# Patient Record
Sex: Female | Born: 1955 | Race: White | Hispanic: No | Smoking: Former smoker
Health system: Southern US, Community
[De-identification: ages and names within clinical notes are randomized; demographics above are authoritative.]

## PROBLEM LIST (undated history)

## (undated) DIAGNOSIS — T7840XA Allergy, unspecified, initial encounter: Secondary | ICD-10-CM

## (undated) DIAGNOSIS — F419 Anxiety disorder, unspecified: Secondary | ICD-10-CM

## (undated) DIAGNOSIS — A319 Mycobacterial infection, unspecified: Secondary | ICD-10-CM

## (undated) DIAGNOSIS — I891 Lymphangitis: Secondary | ICD-10-CM

## (undated) DIAGNOSIS — J449 Chronic obstructive pulmonary disease, unspecified: Secondary | ICD-10-CM

## (undated) DIAGNOSIS — R110 Nausea: Secondary | ICD-10-CM

## (undated) DIAGNOSIS — E785 Hyperlipidemia, unspecified: Secondary | ICD-10-CM

## (undated) DIAGNOSIS — R42 Dizziness and giddiness: Secondary | ICD-10-CM

## (undated) DIAGNOSIS — I10 Essential (primary) hypertension: Secondary | ICD-10-CM

## (undated) DIAGNOSIS — I96 Gangrene, not elsewhere classified: Principal | ICD-10-CM

## (undated) DIAGNOSIS — F32A Depression, unspecified: Secondary | ICD-10-CM

## (undated) DIAGNOSIS — F329 Major depressive disorder, single episode, unspecified: Secondary | ICD-10-CM

## (undated) HISTORY — DX: Gangrene, not elsewhere classified: I96

## (undated) HISTORY — PX: FOOT SURGERY: SHX648

## (undated) HISTORY — DX: Hyperlipidemia, unspecified: E78.5

## (undated) HISTORY — DX: Allergy, unspecified, initial encounter: T78.40XA

## (undated) HISTORY — PX: SLIPPED CAPITAL FEMORAL EPIPHYSIS PINNING: SHX391

## (undated) HISTORY — DX: Essential (primary) hypertension: I10

## (undated) HISTORY — DX: Dizziness and giddiness: R42

## (undated) HISTORY — DX: Nausea: R11.0

## (undated) HISTORY — PX: LEG AMPUTATION: SHX1105

## (undated) HISTORY — DX: Depression, unspecified: F32.A

## (undated) HISTORY — PX: HIP PINNING: SHX1757

## (undated) HISTORY — DX: Anxiety disorder, unspecified: F41.9

## (undated) HISTORY — DX: Major depressive disorder, single episode, unspecified: F32.9

---

## 2011-03-13 ENCOUNTER — Ambulatory Visit (INDEPENDENT_AMBULATORY_CARE_PROVIDER_SITE_OTHER): Payer: BC Managed Care – PPO | Admitting: Family Medicine

## 2011-03-13 ENCOUNTER — Ambulatory Visit: Payer: BC Managed Care – PPO

## 2011-03-13 VITALS — BP 152/70 | HR 81 | Temp 98.0°F | Resp 18 | Ht 69.0 in | Wt 309.8 lb

## 2011-03-13 DIAGNOSIS — J019 Acute sinusitis, unspecified: Secondary | ICD-10-CM

## 2011-03-13 DIAGNOSIS — J329 Chronic sinusitis, unspecified: Secondary | ICD-10-CM

## 2011-03-13 DIAGNOSIS — J40 Bronchitis, not specified as acute or chronic: Secondary | ICD-10-CM

## 2011-03-13 DIAGNOSIS — R0602 Shortness of breath: Secondary | ICD-10-CM

## 2011-03-13 MED ORDER — AMOXICILLIN-POT CLAVULANATE 875-125 MG PO TABS: 1.0000 | ORAL_TABLET | Freq: Two times a day (BID) | ORAL | Status: AC

## 2011-03-13 NOTE — Progress Notes (Addendum)
[facility]:  Office Visit  Chief Complaint:  Chief Complaint  Patient presents with  . Shortness of Breath    COULD NOT LEEP LAST NIGHT FELT LIKE SHE WAS DROWNING  . Sinusitis  . Fever    HPI: Rita Watkins is a 56 y.o. female who complains of  1-2 day history of SOB and sinus pain, inability to sleep due to this, no cough or Temp greater than 101.6. She has tried albuterol and pulmicort with some relief. No cough, no wheezing.Hx of smoking 1/2 ppd.    Past Medical History  Diagnosis Date  . Allergy   . Asthma   . Hypertension   . Hyperlipidemia    Past Surgical History  Procedure Date  . Slipped capital femoral epiphysis pinning    History   Social History  . Marital Status: Married    Spouse Name: N/A    Number of Children: N/A  . Years of Education: N/A   Social History Main Topics  . Smoking status: Current Everyday Smoker -- 0.5 packs/day for 35 years    Types: Cigarettes  . Smokeless tobacco: None  . Alcohol Use: No  . Drug Use: No  . Sexually Active: None   Other Topics Concern  . None   Social History Narrative  . None   Family History  Problem Relation Age of Onset  . Hypertension Mother   . Hypertension Father    Allergies  Allergen Reactions  . Avelox (Moxifloxacin Hcl In Nacl)   . Lamisil Swelling  . Singulair   . Zocor (Simvastatin - High Dose)    Prior to Admission medications   Medication Sig Start Date End Date Taking? Authorizing Provider  albuterol (PROVENTIL) (2.5 MG/3ML) 0.083% nebulizer solution Take 2.5 mg by nebulization every 6 (six) hours as needed.   Yes Historical Provider, MD  amLODipine-benazepril (LOTREL) 10-40 MG per capsule Take 1 capsule by mouth daily.   Yes Historical Provider, MD  dornase alpha (PULMOZYME) 1 MG/ML nebulizer solution Inhale 2.5 mg into the lungs daily.   Yes Historical Provider, MD     ROS: The patient denies fevers, night sweats, unintentional weight loss, chest pain,  palpitations, wheezing, dyspnea on exertion, nausea, vomiting, abdominal pain, dysuria, hematuria, melena, numbness, weakness, or tingling. +SOB, +chills, + sinus pressure, neg cough  All other systems have been reviewed and were otherwise negative with the exception of those mentioned in the HPI and as above.    PHYSICAL EXAM: Filed Vitals:   03/13/11 0936  BP: 152/70  Pulse: 81  Temp: 98 F (36.7 C)  Resp: 18   Filed Vitals:   03/13/11 0936  Height: 5\' 9"  (1.753 m)  Weight: 309 lb 12.8 oz (140.524 kg)   Body mass index is 45.75 kg/(m^2). Pulse Ox: 97-100%, repeat 2 x  General: Alert, no acute distress, obese  HEENT:  Normocephalic, atraumatic, oropharynx patent.+ erythema, + sinus tenederness, Ears normal Cardiovascular:  Regular rate and rhythm, no rubs murmurs or gallops.  No Carotid bruits, radial pulse intact. No pedal edema.  Respiratory: Clear to auscultation bilaterally.  No wheezes, rales, or rhonchi.  No cyanosis, no use of accessory musculature GI: No organomegaly, abdomen is soft and non-tender, positive bowel sounds.  No masses. Skin: No rashes. Neurologic: Facial musculature symmetric. Psychiatric: Patient is appropriate throughout our interaction. Lymphatic: No axillary or cervical lymphadenopathy Musculoskeletal: Gait intact. Genitourinary:   LABS: No results found for this or any previous visit.   EKG/XRAY:  Primary read interpreted by Dr. Conley Rolls at [facility]. No infiltrate, no pneumo, effusion, atelectasis. Bilateral hilar LAD, and increase vasc. No e/o CHF   ASSESSMENT/PLAN: Encounter Diagnoses  Name Primary?  . SOB (shortness of breath) No  . Sinusitis Yes  . Bronchitis Yes   1. Sinusitis-Augmentin and sxs treatment 2. Bronchitis-Augmentin and sxs treatment 3. HTN-patient did not take meds today yet.    Laquonda Welby PHUONG, DO 03/13/2011 10:37 AM

## 2011-04-21 ENCOUNTER — Other Ambulatory Visit: Payer: Self-pay | Admitting: Internal Medicine

## 2011-07-20 ENCOUNTER — Other Ambulatory Visit: Payer: Self-pay | Admitting: Physician Assistant

## 2011-09-14 ENCOUNTER — Encounter: Payer: Self-pay | Admitting: Family Medicine

## 2011-09-14 ENCOUNTER — Ambulatory Visit (INDEPENDENT_AMBULATORY_CARE_PROVIDER_SITE_OTHER): Payer: BC Managed Care – PPO | Admitting: Family Medicine

## 2011-09-14 VITALS — BP 130/66 | HR 78 | Temp 98.3°F | Resp 16 | Ht 69.0 in | Wt 319.4 lb

## 2011-09-14 DIAGNOSIS — E559 Vitamin D deficiency, unspecified: Secondary | ICD-10-CM

## 2011-09-14 DIAGNOSIS — I10 Essential (primary) hypertension: Secondary | ICD-10-CM

## 2011-09-14 DIAGNOSIS — E785 Hyperlipidemia, unspecified: Secondary | ICD-10-CM

## 2011-09-14 DIAGNOSIS — G8929 Other chronic pain: Secondary | ICD-10-CM

## 2011-09-14 DIAGNOSIS — Z23 Encounter for immunization: Secondary | ICD-10-CM

## 2011-09-14 LAB — LIPID PANEL
HDL: 32 mg/dL — ABNORMAL LOW (ref >39)
LDL Cholesterol: 136 mg/dL — ABNORMAL HIGH (ref 0–99)
Total CHOL/HDL Ratio: 7.3 Ratio
Triglycerides: 321 mg/dL — ABNORMAL HIGH (ref <150)

## 2011-09-14 LAB — COMPREHENSIVE METABOLIC PANEL
AST: 13 U/L (ref 0–37)
Alkaline Phosphatase: 97 U/L (ref 39–117)
BUN: 11 mg/dL (ref 6–23)
Creat: 0.72 mg/dL (ref 0.50–1.10)
Glucose, Bld: 86 mg/dL (ref 70–99)
Total Bilirubin: 0.4 mg/dL (ref 0.3–1.2)

## 2011-09-14 MED ORDER — GABAPENTIN 400 MG PO CAPS: 400.0000 mg | ORAL_CAPSULE | Freq: Three times a day (TID) | ORAL | Status: DC

## 2011-09-14 MED ORDER — AMLODIPINE BESY-BENAZEPRIL HCL 10-40 MG PO CAPS: 1.0000 | ORAL_CAPSULE | Freq: Every day | ORAL | Status: DC

## 2011-09-14 MED ORDER — ALBUTEROL SULFATE HFA 108 (90 BASE) MCG/ACT IN AERS: 2.0000 | INHALATION_SPRAY | Freq: Four times a day (QID) | RESPIRATORY_TRACT | Status: DC | PRN

## 2011-09-14 MED ORDER — TRAMADOL-ACETAMINOPHEN 37.5-325 MG PO TABS: ORAL_TABLET | ORAL | Status: DC

## 2011-09-14 MED ORDER — GABAPENTIN 400 MG PO CAPS: ORAL_CAPSULE | ORAL | Status: DC

## 2011-09-14 NOTE — Patient Instructions (Signed)

## 2011-09-15 LAB — VITAMIN D 25 HYDROXY (VIT D DEFICIENCY, FRACTURES): Vit D, 25-Hydroxy: 18 ng/mL — ABNORMAL LOW (ref 30–89)

## 2011-09-17 ENCOUNTER — Other Ambulatory Visit: Payer: Self-pay | Admitting: Family Medicine

## 2011-09-17 MED ORDER — ERGOCALCIFEROL 1.25 MG (50000 UT) PO CAPS: 50000.0000 [IU] | ORAL_CAPSULE | ORAL | Status: AC

## 2011-09-17 NOTE — Progress Notes (Signed)
Quick Note:  Please call pt and advise that the following labs are abnormal... Lipids are above normal. I would suggest pt work on healthier nutrition and regular exercise to lower total cholesterol, LDL ("bad") cholesterol and triglycerides and get HDL ("good") up above 40. These numbers should be rechecked in 3-4 months.  Vitamin D is low; I am e-prescribing Vitamin D 50,000 IU to be taken 1 capsule once a week to get her Vitamin D up to about 50. There will be refills so she needs to take it until she has no refills left.  Copy to pt. ______

## 2011-09-22 ENCOUNTER — Telehealth: Payer: Self-pay

## 2011-09-22 NOTE — Telephone Encounter (Signed)
Spoke with pt, we have to call [facility] for her Rx for Vitamin D, call [phone] #[phone]. I called [facility] to see what the issue was. They just wanted to see if we could give her a 90 day supply. I authorized 90 day supply with 1 refill.

## 2011-09-23 ENCOUNTER — Encounter: Payer: Self-pay | Admitting: Family Medicine

## 2011-09-23 DIAGNOSIS — I10 Essential (primary) hypertension: Secondary | ICD-10-CM | POA: Insufficient documentation

## 2011-09-23 NOTE — Progress Notes (Signed)
S: This 56 y.o. Cauc female returns for HTN follow-up and med refill. She c/o muscle cramps, chronic LBP and allergies. She has been working on weight loss since I last saw her in September. She has hyperlipidemia and has tried Atorvastatin but cannot tolerate the medication (musculoskeletal side-effects). She would like lipids checked today as well as Vitamin D level; she had borderline low Vit D in Sept 2012.  ROS: Denies appetite or activity change, fever or chills, vision disturbances, chest pain, palpitations, SOB, cough or chest tightness, GU/GI symptoms, dizziness, headache, syncope, numbness or weakness. She has muscle cramps and LBP.  O:  Filed Vitals:   09/14/11 0908  BP: 130/66  Pulse: 78  Temp: 98.3 F (36.8 C)  Resp: 16    GEN: In NAD; WN,WD. HENT: [place]/AT; EOMI, conj/scl clear. LUNGS: Normal resp rate and effort;CTA COR: RRR EXT: No edema NEURO: A&O x 3; CNs intact; otherwise, nonfocal.   A/P:      1. Need for prophylactic vaccination against Streptococcus pneumoniae (pneumococcus)  Pneumococcal polysaccharide vaccine 23-valent greater than or equal to 2yo subcutaneous/IM  2. HTN (hypertension)  Comprehensive metabolic panel RF: Amlodipine- benazepril 10-40   #90   3 RFs  3. Hyperlipidemia  Lipid panel  4. Chronic pain  RF: Gabapentin 400 mg  2 caps at bedtime RF: Tramadol- APAP 37.5- 325   2 tabs tid for pain.  5. Unspecified vitamin D deficiency  Vitamin D, 25-hydroxy

## 2012-05-19 ENCOUNTER — Other Ambulatory Visit: Payer: Self-pay | Admitting: Family Medicine

## 2012-05-20 ENCOUNTER — Telehealth: Payer: Self-pay

## 2012-05-20 NOTE — Telephone Encounter (Signed)
Pharmacy requests RF of Tramadol.

## 2012-05-21 ENCOUNTER — Telehealth: Payer: Self-pay | Admitting: *Deleted

## 2012-05-21 MED ORDER — TRAMADOL-ACETAMINOPHEN 37.5-325 MG PO TABS: ORAL_TABLET | ORAL | Status: DC

## 2012-05-21 NOTE — Telephone Encounter (Signed)
I have refilled Tramadol; it will be faxed to [facility].

## 2012-05-21 NOTE — Telephone Encounter (Signed)
Faxed prescription to patient's pharmacy at 3:06p.

## 2012-05-29 ENCOUNTER — Telehealth: Payer: Self-pay

## 2012-05-29 ENCOUNTER — Other Ambulatory Visit: Payer: Self-pay | Admitting: Radiology

## 2012-05-29 MED ORDER — ALPRAZOLAM 0.5 MG PO TABS: ORAL_TABLET | ORAL | Status: DC

## 2012-05-29 NOTE — Telephone Encounter (Signed)
Cancelled the Rx at [address] called in to [facility].

## 2012-05-29 NOTE — Telephone Encounter (Signed)
Rita Watkins is a pt of Dr. Audria Nine, and Rita Watkins is calling to see if something could be called in for her Aniexty her husband passed away this morning. Pharmacy she uses is [facility] on [address] back number is [phone]

## 2012-05-29 NOTE — Telephone Encounter (Signed)
I spoke with pt whose husband died suddenly in his sleep. He was at home. She needs medication called to [facility] for anxiety. Alprazolam 0.5 mg 1 tab bid prn called to [facility] on [address].

## 2012-06-14 ENCOUNTER — Encounter: Payer: Self-pay | Admitting: Family Medicine

## 2012-06-14 ENCOUNTER — Ambulatory Visit (INDEPENDENT_AMBULATORY_CARE_PROVIDER_SITE_OTHER): Payer: Self-pay | Admitting: Family Medicine

## 2012-06-14 VITALS — BP 133/61 | HR 76 | Temp 97.6°F | Resp 16 | Ht 69.5 in | Wt 311.0 lb

## 2012-06-14 DIAGNOSIS — F411 Generalized anxiety disorder: Secondary | ICD-10-CM

## 2012-06-14 DIAGNOSIS — F410 Panic disorder [episodic paroxysmal anxiety] without agoraphobia: Secondary | ICD-10-CM

## 2012-06-14 DIAGNOSIS — F4321 Adjustment disorder with depressed mood: Secondary | ICD-10-CM

## 2012-06-14 MED ORDER — ALPRAZOLAM 0.5 MG PO TABS: ORAL_TABLET | ORAL | Status: DC

## 2012-06-14 MED ORDER — PAROXETINE HCL 20 MG PO TABS: 20.0000 mg | ORAL_TABLET | ORAL | Status: DC

## 2012-06-14 NOTE — Patient Instructions (Addendum)
Please seek grief counseling with the counselor of your choice. HOSPICE is an excellent resource. Take the medication- Paroxetine 20 mg 1 tablet every morning and take Alprazolam as directed to help with panic attacks and anxiety.    Grief Reaction Grief is a normal response to the death of someone close to you. Feelings of fear, anger, and guilt can affect almost everyone who loses someone they love. Symptoms of depression are also common. These include problems with sleep, loss of appetite, and lack of energy. These grief reaction symptoms often last for weeks to months after a loss. They may also return during special times that remind you of the person you lost, such as an anniversary or birthday. Anxiety, insomnia, irritability, and deep depression may last beyond the period of normal grief. If you experience these feelings for 6 months or longer, you may have clinical depression. Clinical depression requires further medical attention. If you think that you have clinical depression, you should contact your caregiver. If you have a history of depression and or a family history of depression, you are at greater risk of clinical depression. You are also at greater risk of developing clinical depression if the loss was traumatic or the loss was of someone with whom you had unresolved issues.  A grief reaction can become complicated by being blocked. This means being unable to cry or express extreme emotions. This may prolong the grieving period and worsen the emotional effects of the loss. Mourning is a natural event in human life. A healthy grief reaction is one that is not blocked . It requires a time of sadness and readjustment.It is very important to share your sorrow and fear with others, especially close friends and family. Professional counselors and clergy can also help you process your grief. Document Released: 01/23/2005 Document Revised: 04/17/2011 Document Reviewed: 10/03/2005 [facility]  Patient Information 2013 [place], [place].

## 2012-06-17 NOTE — Progress Notes (Signed)
S:  This 57 y.o. Cauc female is here to discuss panic attacks, onset after unexpected death of husband who was a patient with this practice.  He died quietly in his sleep at home; pt thought he was sleeping sounding. She could not easily awaken him and realized that he was not breathing. She has been trying to handle financial matters as well as work. She is not sleeping well and having panic attacks w/ SOB, CP and tightness, palpitations, diaphoresis and crying spells. She has Alprazolam but not using it as prescribed for fear of being to heavily sedated. She has not sought grief counseling but some resources have been recommended by her friends.  PMHx, Soc Hx and Fam Hx reviewed.  ROS: As per HPI.  O: Filed Vitals:   06/14/12 1128  BP: 133/61  Pulse: 76  Temp: 97.6 F (36.4 C)  Resp: 16   GEN: In NAD; WN,WD. Pt  Is obese. HENT: [place]/AT. EOMI w/ injected conj and tearing. Otherwise normal. COR: RRR. LUNGS: Normal resp rate and effort. SKIN: W&D; no erythema or pallor. NEURO: A&O x 3; CNs intact. Mentation- pt is emotionally labile and tearful. Speech and thought pattern are coherent and behavior is appropriate given the circumstances. Judgement is normal.  A/P: Panic attacks- Continue Alprazolam; try reducing dose by half.                           RX: Paroxetine 20 mg 1/2 tab every AM for 1st week then increase dose to  1 tablet every AM.  Grief reaction- Encouraged pt to seek counseling; recommended [facility]osp[facility].  Meds ordered this encounter  Medications  . PARoxetine (PAXIL) 20 MG tablet    Sig: Take 1 tablet (20 mg total) by mouth every morning.    Dispense:  30 tablet    Refill:  5  . ALPRAZolam (XANAX) 0.5 MG tablet    Sig: Take 1/2 tablet in morning and 1 tablet at bedtime for sleep.    Dispense:  45 tablet    Refill:  1

## 2012-07-19 ENCOUNTER — Ambulatory Visit: Payer: Self-pay | Admitting: Family Medicine

## 2012-07-19 ENCOUNTER — Encounter: Payer: Self-pay | Admitting: Family Medicine

## 2012-07-19 VITALS — BP 122/64 | HR 72 | Temp 97.9°F | Resp 16 | Ht 69.0 in | Wt 300.0 lb

## 2012-07-19 DIAGNOSIS — I10 Essential (primary) hypertension: Secondary | ICD-10-CM

## 2012-07-19 DIAGNOSIS — F411 Generalized anxiety disorder: Secondary | ICD-10-CM

## 2012-07-19 MED ORDER — ALBUTEROL SULFATE HFA 108 (90 BASE) MCG/ACT IN AERS: INHALATION_SPRAY | RESPIRATORY_TRACT | Status: DC

## 2012-07-19 MED ORDER — AMLODIPINE BESY-BENAZEPRIL HCL 10-40 MG PO CAPS: 1.0000 | ORAL_CAPSULE | Freq: Every day | ORAL | Status: DC

## 2012-07-19 MED ORDER — ALPRAZOLAM 0.5 MG PO TABS: ORAL_TABLET | ORAL | Status: DC

## 2012-07-19 NOTE — Addendum Note (Signed)
Addended by: Dow Adolph B on: 07/19/2012 10:44 AM   Modules accepted: Orders

## 2012-07-19 NOTE — Progress Notes (Signed)
S: This 57 y.o. Cauc female is here for follow-up after starting medication for panic attacks/ grief reaction. She has altered the Paroxetine dose so that she is not as sedated especially if she takes Alprazolam also. The dose that works best for her is: Paroxetine 20 mg 1/2 tablet with Alprazolam 1/2 tablet every morning. This enables her to function and get out of the house to take care of financial matters related to her husband's death. If she feels anxious and panicky, she will take another Paroxetine 1/2 tablet. She does not feel sedated when she takes the medications this way. She has a young man at a [employer] who is helping her with those matters. She has also joined a group of women who have lost their husbands; they meet in the members' homes and are very supported. This has helped the pt tremendously. Pt has concerns about sedation so she is not taking the Tramadol nor the Gabapentin. Appetite is fair; pt has lost 11 pounds since last visit. Pt  Is compliant w/ BP medication w/o adverse effects. In general, she feels better and is working.   PMHx, Soc Hx and Prob List reviewed.  ROS: Negative for diaphoresis, excessive fatigue, CP or tightness, palpitations (except as associated w/ panic attack), GI problems, HA, dizziness, weakness or syncope. She is not agitated or confused and has no SI/HI or thoughts of self harm.  O:  Filed Vitals:   07/19/12 0942  BP: 122/64                                         Weight is down 11 pounds since last visit.  Pulse: 72  Temp: 97.9 F (36.6 C)  Resp: 16   GEN: In NAD; WN,WD.   HENT: [place]/AT; otherwise unremarkable. COR: RRR. LUNGS: Normal resp rate and effort. SKIN: W&D.No pallor. NEURO: A&O x 3; CNs intact. Nonfocal. PSYCH: Flat affect but calm demeanor. Attentive and conversant. Speech pattern is normal and thought content is sound. Judgement is good.  A/P: HTN (hypertension)- Stable and controlled; continue current medication  (refilled).  Anxiety state, unspecified- Current medications are appropriate and effective; agree with pt's dosing. Continue through grieving process w/ support group. (Today is the anniversary of when the pt met her deceased husband online 17 years ago; he died 8 days after their 23-Jun-2022 wedding anniversary).  Pt concerned about elevated lipids and cost of having labs checked. We will address this at next visit.

## 2012-07-19 NOTE — Patient Instructions (Addendum)
I think the medications as you are taking them is working well for you. Continue to try to take good care of yourself and I will see you in 2 months.

## 2012-08-19 ENCOUNTER — Other Ambulatory Visit: Payer: Self-pay | Admitting: Family Medicine

## 2012-09-13 ENCOUNTER — Encounter: Payer: Self-pay | Admitting: Family Medicine

## 2012-09-13 ENCOUNTER — Other Ambulatory Visit: Payer: Self-pay | Admitting: Radiology

## 2012-09-13 ENCOUNTER — Ambulatory Visit (INDEPENDENT_AMBULATORY_CARE_PROVIDER_SITE_OTHER): Payer: BC Managed Care – PPO | Admitting: Family Medicine

## 2012-09-13 VITALS — BP 112/64 | HR 74 | Temp 97.7°F | Resp 18 | Ht 69.0 in | Wt 297.0 lb

## 2012-09-13 DIAGNOSIS — F418 Other specified anxiety disorders: Secondary | ICD-10-CM | POA: Insufficient documentation

## 2012-09-13 DIAGNOSIS — J320 Chronic maxillary sinusitis: Secondary | ICD-10-CM

## 2012-09-13 DIAGNOSIS — F341 Dysthymic disorder: Secondary | ICD-10-CM

## 2012-09-13 HISTORY — DX: Other specified anxiety disorders: F41.8

## 2012-09-13 MED ORDER — ALPRAZOLAM 0.5 MG PO TABS: ORAL_TABLET | ORAL | Status: DC

## 2012-09-13 MED ORDER — MUPIROCIN 2 % EX OINT: TOPICAL_OINTMENT | Freq: Three times a day (TID) | CUTANEOUS | Status: DC

## 2012-09-13 MED ORDER — CEFUROXIME AXETIL 250 MG PO TABS: 250.0000 mg | ORAL_TABLET | Freq: Two times a day (BID) | ORAL | Status: DC

## 2012-09-13 NOTE — Patient Instructions (Addendum)
Sinusitis Sinusitis is redness, soreness, and puffiness (inflammation) of the air pockets in the bones of your face (sinuses). The redness, soreness, and puffiness can cause air and mucus to get trapped in your sinuses. This can allow germs to grow and cause an infection.  HOME CARE   Drink enough fluids to keep your pee (urine) clear or pale yellow.  Use a humidifier in your home.  Run a hot shower to create steam in the bathroom. Sit in the bathroom with the door closed. Breathe in the steam 3 4 times a day.  Put a warm, moist washcloth on your face 3 4 times a day, or as told by your doctor.  Use salt water sprays (saline sprays) to wet the thick fluid in your nose. This can help the sinuses drain.  Only take medicine as told by your doctor. GET HELP RIGHT AWAY IF:   Your pain gets worse.  You have very bad headaches.  You are sick to your stomach (nauseous).  You throw up (vomit).  You are very sleepy (drowsy) all the time.  Your face is puffy (swollen).  Your vision changes.  You have a stiff neck.  You have trouble breathing. MAKE SURE YOU:   Understand these instructions.  Will watch your condition.  Will get help right away if you are not doing well or get worse. Document Released: 07/12/2007 Document Revised: 10/18/2011 Document Reviewed: 08/29/2011 [employer] Patient Information 2014 [employer].  

## 2012-09-13 NOTE — Progress Notes (Signed)
S:  Thsi 57 y.o Cauc female has depression and anxiety following the death of her husband. She has received significant help and counseling with a group of widows. She is taking 1/2 tab of Paxil and this works fine most days. She tried stopping the med for 1 week but realized that she needs to continue this anti-depressant. Alprazolam continues to be helpful. Her appetite has improved.  Pt c/o L sinus pain w/ drainage, sore throat and voice change. She has felt febrile and sluggish. Pt c/o small sore in L nares. She does not have HA, ear pain, epistaxis, a prod cough or chest pain. She denies SOB or DOE.  Patient Active Problem List   Diagnosis Date Noted  . Depression with anxiety 09/13/2012  . Panic attacks 06/17/2012  . HTN (hypertension) 09/23/2011  . Hyperlipidemia 09/23/2011  . Chronic pain 09/23/2011  . Unspecified vitamin D deficiency 09/23/2011    PMHx, Soc Hx and Fam Hx reveiwed. Medications reconciled.  OCeasar Mons Vitals:   09/13/12 0941  BP: 112/64                                Weight down 22 lbs in last 12 months  Pulse: 74  Temp: 97.7 F (36.5 C)  Resp: 18   GEN: In NAD; WN,WD. HENT: [place]/AT; EOMI w/clear conj/ sclerae. EACs normal w/ TMs- mild effusion, no erythema. Nasal mucosa red and slightly edematous. Post ph- erythema w/o exudate. Maxillary sinuses tender (R>L) w/ percussion. NECK: Supple w/ anterior cervical node tenderness on L. No palpable node enlargement. COR: RRR. LUNGS: CTA; no resp distress. SKIN: W&D; no rashes, erythema or pallor. NEURO: A&O x 3; Cns intact. Nonfocal. PSYCH: Pleasant and calm demeanor; flat affect but not labile or tearful.  A/P: Depression with anxiety- Continue current medications and counseling with peer group.  Left maxillary sinusitis-  Meds ordered this encounter  Medications  . ALPRAZolam (XANAX) 0.5 MG tablet    Sig: Take 1/2 tablet in morning and 1 tablet at bedtime for sleep.    Dispense:  45 tablet    Refill:  2  .  DISCONTD: cefUROXime (CEFTIN) 250 MG tablet    Sig: Take 1 tablet (250 mg total) by mouth 2 (two) times daily.    Dispense:  20 tablet    Refill:  0  . DISCONTD: mupirocin ointment (BACTROBAN) 2 %    Sig: Apply topically 3 (three) times daily.    Dispense:  22 g    Refill:  0

## 2012-10-11 ENCOUNTER — Other Ambulatory Visit: Payer: Self-pay | Admitting: Family Medicine

## 2013-01-17 ENCOUNTER — Encounter: Payer: Self-pay | Admitting: Family Medicine

## 2013-01-17 ENCOUNTER — Ambulatory Visit (INDEPENDENT_AMBULATORY_CARE_PROVIDER_SITE_OTHER): Payer: BC Managed Care – PPO | Admitting: Family Medicine

## 2013-01-17 VITALS — BP 151/65 | HR 63 | Temp 97.5°F | Resp 16 | Ht 69.0 in | Wt 293.0 lb

## 2013-01-17 DIAGNOSIS — F410 Panic disorder [episodic paroxysmal anxiety] without agoraphobia: Secondary | ICD-10-CM

## 2013-01-17 DIAGNOSIS — I10 Essential (primary) hypertension: Secondary | ICD-10-CM

## 2013-01-17 DIAGNOSIS — F341 Dysthymic disorder: Secondary | ICD-10-CM

## 2013-01-17 DIAGNOSIS — Z130 Encounter for screening for diseases of the blood and blood-forming organs and certain disorders involving the immune mechanism: Secondary | ICD-10-CM

## 2013-01-17 DIAGNOSIS — F418 Other specified anxiety disorders: Secondary | ICD-10-CM

## 2013-01-17 DIAGNOSIS — E559 Vitamin D deficiency, unspecified: Secondary | ICD-10-CM

## 2013-01-17 LAB — COMPLETE METABOLIC PANEL WITH GFR
AST: 17 U/L (ref 0–37)
Albumin: 3.5 g/dL (ref 3.5–5.2)
Alkaline Phosphatase: 99 U/L (ref 39–117)
BUN: 9 mg/dL (ref 6–23)
Chloride: 105 mEq/L (ref 96–112)
GFR, Est Non African American: >89 mL/min
Potassium: 4.1 mEq/L (ref 3.5–5.3)
Total Bilirubin: 0.5 mg/dL (ref 0.3–1.2)

## 2013-01-17 LAB — TSH: TSH: 1.845 u[IU]/mL (ref 0.350–4.500)

## 2013-01-17 LAB — LIPID PANEL
HDL: 34 mg/dL — ABNORMAL LOW (ref >39)
LDL Cholesterol: 126 mg/dL — ABNORMAL HIGH (ref 0–99)
Total CHOL/HDL Ratio: 6.2 Ratio
VLDL: 52 mg/dL — ABNORMAL HIGH (ref 0–40)

## 2013-01-17 LAB — T4, FREE: Free T4: 1.25 ng/dL (ref 0.80–1.80)

## 2013-01-17 MED ORDER — ALPRAZOLAM 0.5 MG PO TABS: ORAL_TABLET | ORAL | Status: DC

## 2013-01-17 MED ORDER — PAROXETINE HCL 20 MG PO TABS: 20.0000 mg | ORAL_TABLET | ORAL | Status: DC

## 2013-01-18 LAB — VITAMIN D 25 HYDROXY (VIT D DEFICIENCY, FRACTURES): Vit D, 25-Hydroxy: 17 ng/mL — ABNORMAL LOW (ref 30–89)

## 2013-01-20 ENCOUNTER — Other Ambulatory Visit: Payer: Self-pay | Admitting: Family Medicine

## 2013-01-20 MED ORDER — ERGOCALCIFEROL 1.25 MG (50000 UT) PO CAPS: 50000.0000 [IU] | ORAL_CAPSULE | ORAL | Status: DC

## 2013-01-20 NOTE — Progress Notes (Signed)
Spoke with patient about all of her labs. Called in the RX for her vit d and pravastain 20 mg from lab note. Copy of labs sent to the patient.

## 2013-01-20 NOTE — Progress Notes (Signed)
Subjective:    Patient ID: Rita Watkins, female    DOB: 1955-03-08, 57 y.o.   MRN: [id number]  HPI  This 57 y.o. Cauc female continues to grieve the unexpected death of her husband in late  06/06/2022 or early May 2014. She attends meetings w/ other widows and is compliant w/ medications. She still has occasional mild panic attacks requiring Alprazolam 1 tablet twice a day. She takes Paroxetine 20 mg 1/2 tablet twice daily; dosing this way eliminates drowsiness. She is back at work and able to concentrate and function w/o confusion or mental lapses.  Pt has no thoughts of self-harm or SI/HI. Appetite and sleep are good. Weight is down 4 lbs.  HTN- medication compliance is good w/o adverse effects. SBP readings at home are 130-140. Pt denies diaphoresis, CP or tightness, palpitations, edema, SOB or DOE, dizziness, numbness, weakness or syncope.  Pt has hx of low Vitamin D = 18 in August 2013. She has mild fatigue and struggles w/ weight loss. Has not had thyroid function tests checked recently. She is not taking a supplement. Pt also wants lipids rechecked since she is fasting. Not taking medication due to hx of intolerance of high dose Zocor in the past.  PMHx, Surg Hx, SOc and Fam Hx reviewed.  Medications reconciled.   Review of Systems  Constitutional: Negative for diaphoresis, activity change and unexpected weight change.  HENT: Negative.   Eyes: Negative.   Respiratory: Negative for cough, chest tightness, shortness of breath and wheezing.   Cardiovascular: Negative.   Gastrointestinal: Negative.   Endocrine: Negative.   Skin: Negative.   Neurological: Negative.   Psychiatric/Behavioral: Positive for sleep disturbance. The patient is nervous/anxious.        Objective:   Physical Exam  Nursing note and vitals reviewed. Constitutional: She is oriented to person, place, and time. She appears well-developed and well-nourished. No distress.  HENT:  Head: Normocephalic and atraumatic.    Right Ear: External ear normal.  Left Ear: External ear normal.  Nose: Nose normal.  Eyes: Conjunctivae and EOM are normal. Pupils are equal, round, and reactive to light. No scleral icterus.  Neck: Normal range of motion. Neck supple. No thyromegaly present.  Cardiovascular: Normal rate and regular rhythm.   Pulmonary/Chest: Effort normal. No respiratory distress.  Musculoskeletal: Normal range of motion. She exhibits no edema.  Neurological: She is alert and oriented to person, place, and time. No cranial nerve deficit. Coordination normal.  Skin: Skin is warm and dry. She is not diaphoretic. No erythema. No pallor.  Psychiatric: She has a normal mood and affect. Her behavior is normal. Judgment and thought content normal.      Assessment & Plan:  Depression with anxiety - Stable on current medications; continue Paroxetine 20 mg 1/2 tab bid. Continue support group.          Plan: TSH, T4, Free  HTN (hypertension) - Plan: T4, Free, COMPLETE METABOLIC PANEL WITH GFR   Panic attacks- Alprazolam dose change to 0.5 mg  1 tablet bid.  Unspecified vitamin D deficiency - Plan: Vitamin D, 25-hydroxy  Screening for other and unspecified endocrine, nutritional, metabolic, and immunity disorders - Plan: Lipid panel  Meds ordered this encounter  Medications  . ALPRAZolam (XANAX) 0.5 MG tablet    Sig: Take 1 tablet in morning and 1 tablet at bedtime for sleep.    Dispense:  60 tablet    Refill:  2  . PARoxetine (PAXIL) 20 MG tablet  Sig: Take 1 tablet (20 mg total) by mouth every morning.    Dispense:  30 tablet    Refill:  5

## 2013-01-20 NOTE — Progress Notes (Signed)
Quick Note:  Please advise pt regarding following labs... Lipid panel numbers are better compared to 1 year ago. Total cholesterol is down 20 points, triglycerides are down 63 points, HDL ("good cholesterol) is up 2 points, LDL ("bad") cholesterol is down 10 points. Your heart disease risk is down 1.1 point but is still above average for women. I want you to consider low dose Pravastatin; your record shows an intolerance of high-dose Zocor. I think you can tolerate a low-dose statin like Pravastatin. Think about this; in the meantime, get over-the-counter Fish Oil capsules 1000 mg and take 2 capsules daily. Take this supplement separate from other solid pills and capsules.  Thyroid function tests are normal. Metabolic panel is normal (this includes blood sugar, sodium, potassium, kidney and liver function tests).  Vitamin D level is still quite low; I am prescribing Vitamin D 16109 units, to be taken once a week. This prescription will be at [facility] for pick up.  Copy to pt.  ______

## 2013-01-23 ENCOUNTER — Other Ambulatory Visit: Payer: Self-pay | Admitting: Family Medicine

## 2013-04-25 ENCOUNTER — Encounter: Payer: Self-pay | Admitting: Family Medicine

## 2013-04-25 ENCOUNTER — Ambulatory Visit (INDEPENDENT_AMBULATORY_CARE_PROVIDER_SITE_OTHER): Payer: BC Managed Care – PPO | Admitting: Family Medicine

## 2013-04-25 VITALS — BP 138/60 | HR 74 | Temp 98.6°F | Resp 16 | Ht 69.0 in | Wt 293.0 lb

## 2013-04-25 DIAGNOSIS — F341 Dysthymic disorder: Secondary | ICD-10-CM

## 2013-04-25 DIAGNOSIS — F418 Other specified anxiety disorders: Secondary | ICD-10-CM

## 2013-04-25 DIAGNOSIS — I10 Essential (primary) hypertension: Secondary | ICD-10-CM

## 2013-04-25 DIAGNOSIS — E559 Vitamin D deficiency, unspecified: Secondary | ICD-10-CM

## 2013-04-25 MED ORDER — PAROXETINE HCL 20 MG PO TABS: 20.0000 mg | ORAL_TABLET | ORAL | Status: DC

## 2013-04-25 MED ORDER — GABAPENTIN 400 MG PO CAPS: ORAL_CAPSULE | ORAL | Status: DC

## 2013-04-25 MED ORDER — ALPRAZOLAM 0.5 MG PO TABS: ORAL_TABLET | ORAL | Status: DC

## 2013-04-25 MED ORDER — ERGOCALCIFEROL 1.25 MG (50000 UT) PO CAPS: 50000.0000 [IU] | ORAL_CAPSULE | ORAL | Status: DC

## 2013-04-28 NOTE — Progress Notes (Signed)
S:  This 58 y.o. Cauc female is here for HTN follow-up; she is stable on current medication w/o side effects. She takes medication daily and denies vision disturbance, diaphoresis, CP or tightness, palpitations, SOB or DOE, cough, HA, dizziness, weakness, numbness or syncope.   Depression and anxiety- pt's husband died unexpectedly 11 months ago; pt still marks each month since his passing. She is in a support group which has been extremely helpful. They will be meeting tomorrow for lunch. She is doing well on the medications- Paroxetine 20 mg and Alprazolam prn anxiety and for sleep. Her job has been stressful but she is coping well. Pt has good appetite, good energy level and good sleep hygiene. Pt has no thoughts of  agitation, confusion, concentration difficulties, behavior problems, self-harm or SI/HI.  She has a chronic Vitamin D def and continues to take Vitamin D 8657850000 units weekly. She had stopped taking the supplement and noticed a difference w/in a week so she resumed the vitamin.  Patient Active Problem List   Diagnosis Date Noted  . Depression with anxiety 09/13/2012  . Panic attacks 06/17/2012  . HTN (hypertension) 09/23/2011  . Hyperlipidemia 09/23/2011  . Chronic pain 09/23/2011  . Unspecified vitamin D deficiency 09/23/2011   Prior to Admission medications   Medication Sig Start Date End Date Taking? Authorizing Provider  albuterol (PROAIR HFA) 108 (90 BASE) MCG/ACT inhaler 2 puffs by mouth 4 times a day as needed for symptoms. 07/19/12  Yes Maurice MarchBarbara B Erina Hamme, MD  ALPRAZolam Prudy Feeler(XANAX) 0.5 MG tablet Take 1 tablet in morning and 1 tablet at bedtime for sleep.   Yes Maurice MarchBarbara B Cynde Menard, MD  amLODipine-benazepril (LOTREL) 10-40 MG per capsule TAKE 1 CAPSULE DAILY 01/23/13  Yes Maurice MarchBarbara B Roland Lipke, MD  ergocalciferol (DRISDOL) 50000 UNITS capsule Take 1 capsule (50,000 Units total) by mouth once a week.  04/25/14 Yes Maurice MarchBarbara B Terrion Poblano, MD  gabapentin (NEURONTIN) 400 MG capsule Take  1 capsule at bedtime or as directed.   Yes Maurice MarchBarbara B Raahi Korber, MD  PARoxetine (PAXIL) 20 MG tablet Take 1 tablet (20 mg total) by mouth every morning.   Yes Maurice MarchBarbara B Bronwen Pendergraft, MD   PMHx, Surg Hx, Soc and Fam Hx reviewed.  ROS: As per HPI; noncontributory.  O: Filed Vitals:   04/25/13 1524  BP: 138/60  Pulse: 74  Temp: 98.6 F (37 C)  Resp: 16   GEN: In NAD; WN, WD. HENT: [place]/AT; EOMI w/ clear conj/sclerae. Otherwise unremarkable. COR: RRR. LUNGS: Unlabored resp. SKIN: W&D; intact w/o diaphoresis, erythema or pallor. NEURO: A&O x 3; CNs intact. Nonfocal.  A/P: HTN (hypertension)- Stable and controlled on current medication.  Depression with anxiety- Improved on Paroxetine and prn Alprazolam.  Unspecified vitamin D deficiency- Continue Vit D 50000 units.  Meds ordered this encounter  Medications  . gabapentin (NEURONTIN) 400 MG capsule    Sig: Take 1 capsule at bedtime or as directed.    Dispense:  90 capsule    Refill:  3  . ergocalciferol (DRISDOL) 50000 UNITS capsule    Sig: Take 1 capsule (50,000 Units total) by mouth once a week.    Dispense:  12 capsule    Refill:  0  . ALPRAZolam (XANAX) 0.5 MG tablet    Sig: Take 1 tablet in morning and 1 tablet at bedtime for sleep.    Dispense:  60 tablet    Refill:  2  . PARoxetine (PAXIL) 20 MG tablet    Sig: Take 1 tablet (20 mg total)  by mouth every morning.    Dispense:  30 tablet    Refill:  5

## 2013-06-29 ENCOUNTER — Other Ambulatory Visit: Payer: Self-pay | Admitting: Family Medicine

## 2013-08-07 ENCOUNTER — Other Ambulatory Visit: Payer: Self-pay | Admitting: Family Medicine

## 2013-09-12 ENCOUNTER — Encounter: Payer: Self-pay | Admitting: Family Medicine

## 2013-09-12 ENCOUNTER — Ambulatory Visit (INDEPENDENT_AMBULATORY_CARE_PROVIDER_SITE_OTHER): Payer: BC Managed Care – PPO | Admitting: Family Medicine

## 2013-09-12 VITALS — BP 138/61 | HR 70 | Temp 97.4°F | Resp 16 | Ht 69.0 in | Wt 304.6 lb

## 2013-09-12 DIAGNOSIS — I10 Essential (primary) hypertension: Secondary | ICD-10-CM

## 2013-09-12 DIAGNOSIS — J0100 Acute maxillary sinusitis, unspecified: Secondary | ICD-10-CM

## 2013-09-12 DIAGNOSIS — J209 Acute bronchitis, unspecified: Secondary | ICD-10-CM

## 2013-09-12 MED ORDER — IPRATROPIUM BROMIDE 0.02 % IN SOLN
0.5000 mg | Freq: Once | RESPIRATORY_TRACT | Status: AC
  Administered 2013-09-12: 0.5 mg via RESPIRATORY_TRACT

## 2013-09-12 MED ORDER — ALPRAZOLAM 0.5 MG PO TABS: ORAL_TABLET | ORAL | Status: DC

## 2013-09-12 MED ORDER — ALBUTEROL SULFATE (2.5 MG/3ML) 0.083% IN NEBU
2.5000 mg | INHALATION_SOLUTION | Freq: Once | RESPIRATORY_TRACT | Status: AC
  Administered 2013-09-12: 2.5 mg via RESPIRATORY_TRACT

## 2013-09-12 MED ORDER — AMOXICILLIN-POT CLAVULANATE 875-125 MG PO TABS: 1.0000 | ORAL_TABLET | Freq: Two times a day (BID) | ORAL | Status: DC

## 2013-09-12 MED ORDER — AMLODIPINE BESY-BENAZEPRIL HCL 10-40 MG PO CAPS: ORAL_CAPSULE | ORAL | Status: DC

## 2013-09-12 MED ORDER — ALBUTEROL SULFATE HFA 108 (90 BASE) MCG/ACT IN AERS: INHALATION_SPRAY | RESPIRATORY_TRACT | Status: DC

## 2013-09-12 MED ORDER — PAROXETINE HCL 20 MG PO TABS: 20.0000 mg | ORAL_TABLET | ORAL | Status: DC

## 2013-09-12 MED ORDER — METHYLPREDNISOLONE ACETATE 80 MG/ML IJ SUSP
120.0000 mg | Freq: Once | INTRAMUSCULAR | Status: AC
Start: 2013-09-12 — End: 2013-09-12
  Administered 2013-09-12: 120 mg via INTRAMUSCULAR

## 2013-09-12 NOTE — Progress Notes (Signed)
Subjective:    Patient ID: Rita Watkins, female    DOB: [birth year], 58 y.o.   MRN: [id number]  HPI  This 58 y.o. Cauc female is here for medication refills. She has done well on current medication for HTN w/o side effects.  Chronic anxiety is effectively managed with Paroxetine; pt has no somnolence, dizziness, GI upset, sweating , anorexia, nervousness, HA or tremor.  Today, she presents w/ 2-day hx of NP cough, congestion, PND and sore throat. She had fever > 100 degrees w/ chills > 24 hours ago. Has facial pain ans sinus pressure. Upper gums very sensitive and swollen; difficult to put in upper dentures. Feels better today; needs something to help loosen phlegm.   Pt encouraged to sch CPE; she states she does not do "physicals" and "only comes when she needs refills or has a problem".  Patient Active Problem List   Diagnosis Date Noted  . Depression with anxiety 09/13/2012  . Panic attacks 06/17/2012  . HTN (hypertension) 09/23/2011  . Hyperlipidemia 09/23/2011  . Chronic pain 09/23/2011  . Unspecified vitamin D deficiency 09/23/2011    Prior to Admission medications   Medication Sig Start Date End Date Taking? Authorizing Provider  albuterol (PROAIR HFA) 108 (90 BASE) MCG/ACT inhaler 2 puffs by mouth 4 times a day as needed for symptoms.   Yes Maurice March, MD  ALPRAZolam Prudy Feeler) 0.5 MG tablet Take 1 tablet in morning and 1 tablet at bedtime for sleep.   Yes Maurice March, MD  amLODipine-benazepril (LOTREL) 10-40 MG per capsule TAKE 1 CAPSULE DAILY   Yes Maurice March, MD  gabapentin (NEURONTIN) 400 MG capsule Take 1 capsule at bedtime or as directed. 04/25/13  Yes Maurice March, MD  PARoxetine (PAXIL) 20 MG tablet Take 1 tablet (20 mg total) by mouth every morning.   Yes Maurice March, MD  Vitamin D, Ergocalciferol, (DRISDOL) 50000 UNITS CAPS capsule TAKE 1 CAPSULE ONCE A WEEK   Yes Maurice March, MD    Allergies  Allergen Reactions  .  Terbinafine Hcl Swelling  . Avelox [Moxifloxacin Hcl In Nacl]   . Montelukast Sodium Other (See Comments)    Insomnia  . Zocor [Simvastatin - High Dose]     Past Surgical History  Procedure Laterality Date  . Slipped capital femoral epiphysis pinning      Family History  Problem Relation Age of Onset  . Hypertension Mother   . Hyperlipidemia Mother   . Hypertension Brother   . COPD Brother     History   Social History  . Marital Status: Widowed (husband died 2012/07/04)    Spouse Name: N/A    Number of Children: N/A  . 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 Smoking status: Current Every Day Smoker -- 0.50 packs/day for 35 years    Types: Cigarettes  . Smokeless tobacco: Not on file  . Alcohol Use: No  . Drug Use: No  . Sexual Activity: Not on file   Other Topics Concern  . Not on file   Social History Narrative   Does not exercise.    Review of Systems  Constitutional: Positive for fever and chills. Negative for diaphoresis and unexpected weight change.  HENT: Positive for congestion, ear pain, postnasal drip, sinus pressure and sore throat. Negative for rhinorrhea and trouble swallowing.   Eyes: Negative for pain, discharge and visual disturbance.  Respiratory: Positive for cough and chest tightness. Negative for choking, shortness of breath and wheezing.   Cardiovascular: Negative for chest pain, palpitations and leg swelling.  Gastrointestinal: Negative.   Skin: Negative for pallor and rash.  Neurological: Negative.   Psychiatric/Behavioral: The patient is nervous/anxious.       Objective:   Physical Exam  Nursing note and vitals reviewed. Constitutional: She is oriented to person, place, and time. Vital signs are normal. She appears well-developed and well-nourished. No distress.  HENT:  Head: Normocephalic and atraumatic.  Right Ear: Hearing, tympanic membrane, external ear and ear  canal normal.  Left Ear: Hearing, tympanic membrane, external ear and ear canal normal.  Nose: Rhinorrhea present. No mucosal edema, nasal deformity or septal deviation. Right sinus exhibits maxillary sinus tenderness and frontal sinus tenderness. Left sinus exhibits maxillary sinus tenderness and frontal sinus tenderness.  Mouth/Throat: Uvula is midline and mucous membranes are normal. She has dentures. No oral lesions. No uvula swelling. Posterior oropharyngeal erythema present. No oropharyngeal exudate or posterior oropharyngeal edema.  Eyes: EOM and lids are normal. Pupils are equal, round, and reactive to light. Right conjunctiva is injected. Left conjunctiva is injected. No scleral icterus.  Neck: Normal range of motion and full passive range of motion without pain. No spinous process tenderness and no muscular tenderness present.  Cardiovascular: Normal rate, regular rhythm, S1 normal, S2 normal and normal heart sounds.   No extrasystoles are present. Exam reveals no friction rub.   No murmur heard. Pulmonary/Chest: Effort normal and breath sounds normal. No respiratory distress. She has no decreased breath sounds. She has no wheezes.  Neurological: She is alert and oriented to person, place, and time. No cranial nerve deficit or sensory deficit. Coordination and gait normal.  Skin: Skin is warm, dry and intact. No ecchymosis and no rash noted. She is not diaphoretic. No cyanosis or erythema. No pallor.  Psychiatric: She has a normal mood and affect. Her speech is normal and behavior is normal. Judgment and thought content normal. Cognition and memory are normal.    Nebulizer treatment with Atrovent and Albuterol; pt feels better after treatment and states cough is slightly productive.     Assessment & Plan:  Acute bronchitis, unspecified organism - RX: Augmentin 875-125 mg tabs  1 tablet twice daily for 10 days.    May use Mucinex-DM 1 tab twice a day. Plan: methylPREDNISolone acetate  (DEPO-MEDROL) injection 120 mg, albuterol (PROVENTIL) (2.5 MG/3ML) 0.083% nebulizer solution 2.5 mg, ipratropium (ATROVENT) nebulizer solution 0.5 mg  Acute maxillary sinusitis, recurrence not specified  Essential hypertension- Stable on current medication.  Meds ordered this encounter  Medications  . methylPREDNISolone acetate (DEPO-MEDROL) injection 120 mg    Sig:   . albuterol (PROVENTIL) (2.5 MG/3ML) 0.083% nebulizer solution 2.5 mg    Sig:   . ipratropium (ATROVENT) nebulizer solution 0.5 mg    Sig:   . albuterol (PROAIR HFA) 108 (90 BASE) MCG/ACT inhaler    Sig: 2 puffs by mouth 4 times a day as needed for symptoms.    Dispense:  3 Inhaler    Refill:  3  . amLODipine-benazepril (LOTREL) 10-40 MG per capsule    Sig: TAKE 1 CAPSULE DAILY    Dispense:  90 capsule    Refill:  3  . amoxicillin-clavulanate (AUGMENTIN) 875-125 MG per tablet    Sig: Take 1 tablet by mouth 2 (two) times daily.    Dispense:  20 tablet    Refill:  0  . ALPRAZolam Prudy Feeler(XANAX)  0.5 MG tablet    Sig: Take 1 tablet in morning and 1 tablet at bedtime for sleep.    Dispense:  60 tablet    Refill:  2  . PARoxetine (PAXIL) 20 MG tablet    Sig: Take 1 tablet (20 mg total) by mouth every morning.    Dispense:  90 tablet    Refill:  3    Pt advised to schedule CPE/labs appointment.

## 2013-09-12 NOTE — Patient Instructions (Signed)
Sinusitis Sinusitis is redness, soreness, and inflammation of the paranasal sinuses. Paranasal sinuses are air pockets within the bones of your face (beneath the eyes, the middle of the forehead, or above the eyes). In healthy paranasal sinuses, mucus is able to drain out, and air is able to circulate through them by way of your nose. However, when your paranasal sinuses are inflamed, mucus and air can become trapped. This can allow bacteria and other germs to grow and cause infection. Sinusitis can develop quickly and last only a short time (acute) or continue over a long period (chronic). Sinusitis that lasts for more than 12 weeks is considered chronic.  CAUSES  Causes of sinusitis include:  Allergies.  Structural abnormalities, such as displacement of the cartilage that separates your nostrils (deviated septum), which can decrease the air flow through your nose and sinuses and affect sinus drainage.  Functional abnormalities, such as when the small hairs (cilia) that line your sinuses and help remove mucus do not work properly or are not present. SIGNS AND SYMPTOMS  Symptoms of acute and chronic sinusitis are the same. The primary symptoms are pain and pressure around the affected sinuses. Other symptoms include:  Upper toothache.  Earache.  Headache.  Bad breath.  Decreased sense of smell and taste.  A cough, which worsens when you are lying flat.  Fatigue.  Fever.  Thick drainage from your nose, which often is green and may contain pus (purulent).  Swelling and warmth over the affected sinuses. DIAGNOSIS  Your health care provider will perform a physical exam. During the exam, your health care provider may:  Look in your nose for signs of abnormal growths in your nostrils (nasal polyps).  Tap over the affected sinus to check for signs of infection.  View the inside of your sinuses (endoscopy) using an imaging device that has a light attached (endoscope). If your health  care provider suspects that you have chronic sinusitis, one or more of the following tests may be recommended:  Allergy tests.  Nasal culture. A sample of mucus is taken from your nose, sent to a lab, and screened for bacteria.  Nasal cytology. A sample of mucus is taken from your nose and examined by your health care provider to determine if your sinusitis is related to an allergy. TREATMENT  Most cases of acute sinusitis are related to a viral infection and will resolve on their own within 10 days. Sometimes medicines are prescribed to help relieve symptoms (pain medicine, decongestants, nasal steroid sprays, or saline sprays).  However, for sinusitis related to a bacterial infection, your health care provider will prescribe antibiotic medicines. These are medicines that will help kill the bacteria causing the infection.  Rarely, sinusitis is caused by a fungal infection. In theses cases, your health care provider will prescribe antifungal medicine. For some cases of chronic sinusitis, surgery is needed. Generally, these are cases in which sinusitis recurs more than 3 times per year, despite other treatments. HOME CARE INSTRUCTIONS   Drink plenty of water. Water helps thin the mucus so your sinuses can drain more easily.  Use a humidifier.  Inhale steam 3 to 4 times a day (for example, sit in the bathroom with the shower running).  Apply a warm, moist washcloth to your face 3 to 4 times a day, or as directed by your health care provider.  Use saline nasal sprays to help moisten and clean your sinuses.  Take medicines only as directed by your health care provider.    If you were prescribed either an antibiotic or antifungal medicine, finish it all even if you start to feel better. SEEK IMMEDIATE MEDICAL CARE IF:  You have increasing pain or severe headaches.  You have nausea, vomiting, or drowsiness.  You have swelling around your face.  You have vision problems.  You have a stiff  neck.  You have difficulty breathing. MAKE SURE YOU:   Understand these instructions.  Will watch your condition.  Will get help right away if you are not doing well or get worse. Document Released: 01/23/2005 Document Revised: 06/09/2013 Document Reviewed: 02/07/2011 [facility] Patient Information 2015 [place], [place]. This information is not intended to replace advice given to you by your health care provider. Make sure you discuss any questions you have with your health care provider.     Acute Bronchitis Bronchitis is inflammation of the airways that extend from the windpipe into the lungs (bronchi). The inflammation often causes mucus to develop. This leads to a cough, which is the most common symptom of bronchitis.  In acute bronchitis, the condition usually develops suddenly and goes away over time, usually in a couple weeks. Smoking, allergies, and asthma can make bronchitis worse. Repeated episodes of bronchitis may cause further lung problems.  CAUSES Acute bronchitis is most often caused by the same virus that causes a cold. The virus can spread from person to person (contagious) through coughing, sneezing, and touching contaminated objects. SIGNS AND SYMPTOMS   Cough.   Fever.   Coughing up mucus.   Body aches.   Chest congestion.   Chills.   Shortness of breath.   Sore throat.  DIAGNOSIS  Acute bronchitis is usually diagnosed through a physical exam. Your health care provider will also ask you questions about your medical history. Tests, such as chest X-rays, are sometimes done to rule out other conditions.  TREATMENT  Acute bronchitis usually goes away in a couple weeks. Oftentimes, no medical treatment is necessary. Medicines are sometimes given for relief of fever or cough. Antibiotic medicines are usually not needed but may be prescribed in certain situations. In some cases, an inhaler may be recommended to help reduce shortness of breath and control  the cough. A cool mist vaporizer may also be used to help thin bronchial secretions and make it easier to clear the chest.  HOME CARE INSTRUCTIONS  Get plenty of rest.   Drink enough fluids to keep your urine clear or pale yellow (unless you have a medical condition that requires fluid restriction). Increasing fluids may help thin your respiratory secretions (sputum) and reduce chest congestion, and it will prevent dehydration.   Take medicines only as directed by your health care provider.  If you were prescribed an antibiotic medicine, finish it all even if you start to feel better.  Avoid smoking and secondhand smoke. Exposure to cigarette smoke or irritating chemicals will make bronchitis worse. If you are a smoker, consider using nicotine gum or skin patches to help control withdrawal symptoms. Quitting smoking will help your lungs heal faster.   Reduce the chances of another bout of acute bronchitis by washing your hands frequently, avoiding people with cold symptoms, and trying not to touch your hands to your mouth, nose, or eyes.   Keep all follow-up visits as directed by your health care provider.  SEEK MEDICAL CARE IF: Your symptoms do not improve after 1 week of treatment.  SEEK IMMEDIATE MEDICAL CARE IF:  You develop an increased fever or chills.   You have  chest pain.   You have severe shortness of breath.  You have bloody sputum.   You develop dehydration.  You faint or repeatedly feel like you are going to pass out.  You develop repeated vomiting.  You develop a severe headache. MAKE SURE YOU:   Understand these instructions.  Will watch your condition.  Will get help right away if you are not doing well or get worse. Document Released: 03/02/2004 Document Revised: 06/09/2013 Document Reviewed: 07/16/2012 [facility] Patient Information 2015 [place]. This information is not intended to replace advice given to you by your health care provider.  Make sure you discuss any questions you have with your health care provider.    SCHEDULE A PHYSICAL EXAM BEFORE THE END OF THE YEAR.

## 2014-01-16 ENCOUNTER — Ambulatory Visit (INDEPENDENT_AMBULATORY_CARE_PROVIDER_SITE_OTHER): Payer: BC Managed Care – PPO | Admitting: Family Medicine

## 2014-01-16 ENCOUNTER — Encounter: Payer: Self-pay | Admitting: Family Medicine

## 2014-01-16 VITALS — BP 144/66 | HR 73 | Temp 98.4°F | Resp 18 | Ht 69.25 in | Wt 321.6 lb

## 2014-01-16 DIAGNOSIS — Z130 Encounter for screening for diseases of the blood and blood-forming organs and certain disorders involving the immune mechanism: Secondary | ICD-10-CM

## 2014-01-16 DIAGNOSIS — F418 Other specified anxiety disorders: Secondary | ICD-10-CM

## 2014-01-16 DIAGNOSIS — Z1329 Encounter for screening for other suspected endocrine disorder: Secondary | ICD-10-CM

## 2014-01-16 DIAGNOSIS — E7800 Pure hypercholesterolemia, unspecified: Secondary | ICD-10-CM

## 2014-01-16 DIAGNOSIS — Z23 Encounter for immunization: Secondary | ICD-10-CM

## 2014-01-16 DIAGNOSIS — R635 Abnormal weight gain: Secondary | ICD-10-CM

## 2014-01-16 DIAGNOSIS — I10 Essential (primary) hypertension: Secondary | ICD-10-CM

## 2014-01-16 DIAGNOSIS — Z131 Encounter for screening for diabetes mellitus: Secondary | ICD-10-CM

## 2014-01-16 DIAGNOSIS — E559 Vitamin D deficiency, unspecified: Secondary | ICD-10-CM

## 2014-01-16 LAB — BASIC METABOLIC PANEL
BUN: 12 mg/dL (ref 6–23)
CALCIUM: 8.4 mg/dL (ref 8.4–10.5)
CO2: 29 meq/L (ref 19–32)
CREATININE: 0.73 mg/dL (ref 0.50–1.10)
Chloride: 102 mEq/L (ref 96–112)
Glucose, Bld: 104 mg/dL — ABNORMAL HIGH (ref 70–99)
Potassium: 4.3 mEq/L (ref 3.5–5.3)
Sodium: 137 mEq/L (ref 135–145)

## 2014-01-16 LAB — CBC
HCT: 42.3 % (ref 36.0–46.0)
Hemoglobin: 14.2 g/dL (ref 12.0–15.0)
MCH: 29 pg (ref 26.0–34.0)
MCHC: 33.6 g/dL (ref 30.0–36.0)
MCV: 86.3 fL (ref 78.0–100.0)
MPV: 9.9 fL (ref 9.4–12.4)
PLATELETS: 291 10*3/uL (ref 150–400)
RBC: 4.9 MIL/uL (ref 3.87–5.11)
RDW: 13.9 % (ref 11.5–15.5)
WBC: 7.3 10*3/uL (ref 4.0–10.5)

## 2014-01-16 LAB — TSH: TSH: 1.771 u[IU]/mL (ref 0.350–4.500)

## 2014-01-16 LAB — POCT GLYCOSYLATED HEMOGLOBIN (HGB A1C): Hemoglobin A1C: 5.5

## 2014-01-16 MED ORDER — ZOSTER VACCINE LIVE 19400 UNT/0.65ML ~~LOC~~ SOLR: 0.6500 mL | Freq: Once | SUBCUTANEOUS | Status: DC

## 2014-01-16 MED ORDER — VITAMIN D (ERGOCALCIFEROL) 1.25 MG (50000 UNIT) PO CAPS: ORAL_CAPSULE | ORAL | Status: DC

## 2014-01-16 MED ORDER — GABAPENTIN 400 MG PO CAPS: ORAL_CAPSULE | ORAL | Status: DC

## 2014-01-16 MED ORDER — PAROXETINE HCL 20 MG PO TABS: 20.0000 mg | ORAL_TABLET | ORAL | Status: DC

## 2014-01-16 MED ORDER — ALPRAZOLAM 0.5 MG PO TABS: ORAL_TABLET | ORAL | Status: DC

## 2014-01-16 NOTE — Patient Instructions (Signed)
We will be in contact with you regarding the results of your labs we drew today. Please take your shingles vaccine order to the pharmacy to get the cost. Please return the stool blood sample to the clinic so we can check for any blood in the stool. We've refilled your prescriptions. Please keep up with the exercise you recently started. We'll see you back in 4 months.

## 2014-01-16 NOTE — Progress Notes (Signed)
Subjective:    Patient ID: Rita FieldingSally J Watkins, female    DOB: 1956-01-03, 58 y.o.   MRN: [id number]  PCP: Dow AdolphMCPHERSON,BARBARA, MD  Chief Complaint  Patient presents with  . Medication Refill   Patient Active Problem List   Diagnosis Date Noted  . Depression with anxiety 09/13/2012  . Panic attacks 06/17/2012  . HTN (hypertension) 09/23/2011  . Hyperlipidemia 09/23/2011  . Chronic pain 09/23/2011  . Unspecified vitamin D deficiency 09/23/2011   Prior to Admission medications   Medication Sig Start Date End Date Taking? Authorizing Provider  albuterol (PROAIR HFA) 108 (90 BASE) MCG/ACT inhaler 2 puffs by mouth 4 times a day as needed for symptoms. 09/12/13  Yes Maurice MarchBarbara B McPherson, MD  ALPRAZolam Prudy Feeler(XANAX) 0.5 MG tablet Take 1 tablet in morning and 1 tablet at bedtime for sleep. 01/16/14  Yes Maurice MarchBarbara B McPherson, MD  amLODipine-benazepril (LOTREL) 10-40 MG per capsule TAKE 1 CAPSULE DAILY 09/12/13  Yes Maurice MarchBarbara B McPherson, MD  gabapentin (NEURONTIN) 400 MG capsule Take 1 capsule at bedtime or as directed. 01/16/14  Yes Lexxie Winberg, PA  PARoxetine (PAXIL) 20 MG tablet Take 1 tablet (20 mg total) by mouth every morning. 01/16/14  Yes Raelyn Ensignodd Ronya Gilcrest, PA  pravastatin (PRAVACHOL) 20 MG tablet  11/01/13  Yes Historical Provider, MD  Vitamin D, Ergocalciferol, (DRISDOL) 50000 UNITS CAPS capsule TAKE 1 CAPSULE ONCE A WEEK 01/16/14  Yes Raelyn Ensignodd Shene Maxfield, PA   Medications, allergies, past medical history, surgical history, family history, social history and problem list reviewed and updated.  HPI  [age] yof with PMH htn, hyperlipidemia, vit d deficiency, and depression with anxiety presents for med refills.  Has been doing well since we saw last saw her. BP and weight are both up today. Takes her BP at home stating it typically runs 120/70s range. States she is anxious today as holiday season continues to be stressful for her since husband's sudden passing 1.5 yrs ago. As for weight, states that she actually  stopped eating much of anything for months after his passing, but is slowly regaining her appetite now. She attributes the weight gain to this. She is taking her xanax bid every day.   Started low dose pravastatin last year after having high ldl. She was previously intolerant to high dose zocor. Was also intolerant to the low dose pravastatin due to muscle aches. She has only been taking the statin 1-2x/week and this has helped with the aches and pains. She is taking fish oil daily.   She is due for some preventative maintenance today. Declines colonoscopy, mammo, pap at this time. Not yet due for dexa. Received pneumovax 2 yrs ago. Received flu vaccine this fall. Interested in zoster vaccine.   Recently purchased a home rowing machine, exercise bike. Has been using it the past week.   Review of Systems No CP, SOB, edema, palps, presyncope, syncope    Objective:   Physical Exam  Constitutional: She is oriented to person, place, and time. She appears well-developed and well-nourished.  Non-toxic appearance. She does not have a sickly appearance. She does not appear ill. No distress.  BP 144/66 mmHg  Pulse 73  Temp(Src) 98.4 F (36.9 C) (Oral)  Resp 18  Ht 5' 9.25" (1.759 m)  Wt 321 lb 9.6 oz (145.877 kg)  BMI 47.15 kg/m2  SpO2 96%   Eyes: Conjunctivae and EOM are normal. Pupils are equal, round, and reactive to light.  Cardiovascular: Normal rate, regular rhythm and normal heart sounds.  Exam reveals  no gallop.   No murmur heard. No LE edema bilaterally.   Pulmonary/Chest: Effort normal. No tachypnea. She has no decreased breath sounds. She has no wheezes. She has no rhonchi. She has no rales.  Neurological: She is alert and oriented to person, place, and time.  Psychiatric: She has a normal mood and affect. Her speech is normal.   Results for orders placed or performed in visit on 01/16/14  POCT glycosylated hemoglobin (Hb A1C)  Result Value Ref Range   Hemoglobin A1C 5.5         Assessment & Plan:   [age] yof with PMH htn, hyperlipidemia, vit d deficiency, and depression with anxiety presents for med refills.  Weight gain - Plan: POCT glycosylated hemoglobin (Hb A1C) --attributes to regaining appetite as husband's passing becomes more distant --encouraged continuing with recent exercise program  Depression with anxiety - Plan: PARoxetine (PAXIL) 20 MG tablet --paxil qd refilled --xanax bid prn refilled --continuing to recover from husband's passing 1.5 yrs ago  Essential hypertension - Plan: Basic metabolic panel --lotrel refilled --elevated today but pt states has been well controlled at home  Vitamin D deficiency - Plan: Vitamin D 1,25 dihydroxy --D2 refilled  Pure hypercholesterolemia - Plan: Lipid panel  Need for zoster vaccination - Plan: CANCELED: Varicella-zoster vaccine subcutaneous --script given for shingles vaccine  Screening for deficiency anemia - Plan: CBC --hemosure card given to pt as she declines colonoscopy, to return to clinic  Screening for diabetes mellitus - Plan: POCT glycosylated hemoglobin (Hb A1C) --5.5 today --continue exercise program  Screening for thyroid disorder - Plan: TSH  Donnajean Lopesodd M. Vianne Grieshop, PA-C Physician Assistant-Certified [facility]  01/16/2014

## 2014-01-19 ENCOUNTER — Encounter: Payer: Self-pay | Admitting: Family Medicine

## 2014-01-19 LAB — VITAMIN D 1,25 DIHYDROXY
VITAMIN D2 1, 25 (OH): 34 pg/mL
VITAMIN D3 1, 25 (OH): 23 pg/mL
Vitamin D 1, 25 (OH)2 Total: 57 pg/mL (ref 18–72)

## 2014-01-19 NOTE — Progress Notes (Addendum)
S:  This 58 y.o. Cauc female is here for follow-up and medication refill. She has chronic obesity and acknowledges weight gain; after husband's expected death 1.5 years ago, appetite suffered and pt lost weight. She has worked through the grieving process w/ a support group and now has joined a church where she feels at home. Appetite is normal but physical activity is lacking. She is working and , in general, enjoys her life now. The Holidays are difficult but she is compliant w/ medications w/o adverse effects. Alprazolam bid is effective for anxiety; Paxil 20 mg daily continues to be very effective.  HTN- Pt is compliant w/ medication; has no adverse effects. Denies diaphoresis, CP or tightness, palpitations, SOB or DOE, edema, cough, HA, dizziness, numbness, weakness or syncope. She will be working on weight reduction at home with exercise equipment.  Lipid disorder- Pt continues to take statin w/o adverse effects (myalgia or GI upset).  HCM- discussed scheduling OV for CPE; pt declines, stating "I don't do those types of visits".  Patient Active Problem List   Diagnosis Date Noted  . Depression with anxiety 09/13/2012  . Panic attacks 06/17/2012  . HTN (hypertension) 09/23/2011  . Hyperlipidemia 09/23/2011  . Chronic pain 09/23/2011  . Unspecified vitamin D deficiency 09/23/2011    Prior to Admission medications   Medication Sig Start Date End Date Taking? Authorizing Provider  albuterol (PROAIR HFA) 108 (90 BASE) MCG/ACT inhaler 2 puffs by mouth 4 times a day as needed for symptoms. 09/12/13  Yes Maurice MarchBarbara B Janyia Guion, MD  ALPRAZolam Prudy Feeler(XANAX) 0.5 MG tablet Take 1 tablet in morning and 1 tablet at bedtime for sleep. 09/12/13  Yes Maurice MarchBarbara B Kendel Bessey, MD  amLODipine-benazepril (LOTREL) 10-40 MG per capsule TAKE 1 CAPSULE DAILY 09/12/13  Yes Maurice MarchBarbara B Spiro Ausborn, MD  gabapentin (NEURONTIN) 400 MG capsule Take 1 capsule at bedtime or as directed. 04/25/13  Yes   PARoxetine (PAXIL) 20 MG tablet Take 1  tablet (20 mg total) by mouth every morning. 09/12/13  Yes   pravastatin (PRAVACHOL) 20 MG tablet  11/01/13  Yes Historical Provider, MD  Vitamin D, Ergocalciferol, (DRISDOL) 50000 UNITS CAPS capsule TAKE 1 CAPSULE ONCE A WEEK   Yes     SOC and FAM Hx reviewed.   ROS: As per HPI; negative for agitation, dysphoric mood, confusion, lack of concentration, thoughts of self-harm or SI/HI.  O: Filed Vitals:   01/16/14 1051  BP: 144/66  Pulse: 73  Temp: 98.4 F (36.9 C)  Resp: 18   GEN: In NAD: WN,WD (obese). HENT: [place]/AT; EOMI w/ clear conj/sclerae. Otherwise unremarkable. COR: RRR; trace pedal edema. LUNGS: Normal resp rate and effort. SKIN: W&D; intact w/o diaphoresis, erythema or pallor. MS: MAEs; no deformities or atrophy. NEURO: A&O x 3; CNs intact. Nonfocal.  A1c= 5.5%.   A/P: Weight gain - Screen for DM given pt's weight and and chronic obesity. Plan: POCT glycosylated hemoglobin (Hb A1C)  Depression with anxiety -  Stable and well controlled on current medications. Plan: PARoxetine (PAXIL) 20 MG tablet  Essential hypertension - Plan: Basic metabolic panel  Vitamin D deficiency - Recheck and continue supplement. Plan: Vitamin D 1,25 dihydroxy  Pure hypercholesterolemia - Statin well tolerated; encourage lifestyle changes. Plan: Lipid panel  Need for zoster vaccination - Plan: CANCELED: Varicella-zoster vaccine subcutaneous  Screening for deficiency anemia - Plan: CBC  Screening for diabetes mellitus - Plan: POCT glycosylated hemoglobin (Hb A1C)  Screening for thyroid disorder - Plan: TSH   Meds ordered this encounter  Medications  . pravastatin (PRAVACHOL) 20 MG tablet    Sig:     Refill:  5  . Vitamin D, Ergocalciferol, (DRISDOL) 50000 UNITS CAPS capsule    Sig: TAKE 1 CAPSULE ONCE A WEEK    Dispense:  12 capsule    Refill:  1  . gabapentin (NEURONTIN) 400 MG capsule    Sig: Take 1 capsule at bedtime or as directed.    Dispense:  90 capsule    Refill:   3  . PARoxetine (PAXIL) 20 MG tablet    Sig: Take 1 tablet (20 mg total) by mouth every morning.    Dispense:  90 tablet    Refill:  3  . ALPRAZolam (XANAX) 0.5 MG tablet    Sig: Take 1 tablet in morning and 1 tablet at bedtime for sleep.    Dispense:  60 tablet    Refill:  2    60 tablets must last 30 days; no early refills.  . zoster vaccine live, PF, (ZOSTAVAX) [id number] UNT/0.65ML injection    Sig: Inject 19,400 Units into the skin once.    Dispense:  1 each    Refill:  0

## 2014-04-02 ENCOUNTER — Telehealth: Payer: Self-pay

## 2014-04-02 NOTE — Telephone Encounter (Signed)
Patient is going to be traveling and wants to be sure she is up to date on immunizations.  [place]   [phone]

## 2014-04-02 NOTE — Telephone Encounter (Signed)
Can someone help? Im not sure what immunizations she needs going to this location.  Immunizations           Influenza Split 11/20/2013         Influenza Whole 11/10/2012          Pneumococcal Polysaccharide-23 09/14/2011          Tdap

## 2014-04-03 NOTE — Telephone Encounter (Signed)
Pt's immunizations are current; she has had Tdap also. She can contact [facility]. If she is staying in the city and not venturing out into rural areas, she may not need any additional vaccines (but I am not certain about this).

## 2014-04-07 NOTE — Telephone Encounter (Signed)
Pt spoke with the travel medication clinic and they told her that since there are contaminants in the water that she should have the Hep A and typhoid vaccine.  Advised pt she should go to the travel medicine clinic to receive those two

## 2014-04-25 ENCOUNTER — Encounter (HOSPITAL_BASED_OUTPATIENT_CLINIC_OR_DEPARTMENT_OTHER): Payer: Self-pay

## 2014-04-25 ENCOUNTER — Inpatient Hospital Stay (HOSPITAL_BASED_OUTPATIENT_CLINIC_OR_DEPARTMENT_OTHER)
Admission: EM | Admit: 2014-04-25 | Discharge: 2014-04-27 | DRG: 202 | Disposition: A | Discharge status: Home / Self Care | Payer: BLUE CROSS/BLUE SHIELD | Attending: Internal Medicine | Admitting: Internal Medicine

## 2014-04-25 ENCOUNTER — Other Ambulatory Visit (HOSPITAL_BASED_OUTPATIENT_CLINIC_OR_DEPARTMENT_OTHER): Payer: Self-pay

## 2014-04-25 ENCOUNTER — Emergency Department (HOSPITAL_BASED_OUTPATIENT_CLINIC_OR_DEPARTMENT_OTHER): Payer: BLUE CROSS/BLUE SHIELD

## 2014-04-25 DIAGNOSIS — E785 Hyperlipidemia, unspecified: Secondary | ICD-10-CM | POA: Diagnosis present

## 2014-04-25 DIAGNOSIS — I10 Essential (primary) hypertension: Secondary | ICD-10-CM | POA: Diagnosis present

## 2014-04-25 DIAGNOSIS — Z888 Allergy status to other drugs, medicaments and biological substances status: Secondary | ICD-10-CM

## 2014-04-25 DIAGNOSIS — F418 Other specified anxiety disorders: Secondary | ICD-10-CM | POA: Diagnosis present

## 2014-04-25 DIAGNOSIS — J45909 Unspecified asthma, uncomplicated: Secondary | ICD-10-CM | POA: Diagnosis present

## 2014-04-25 DIAGNOSIS — R0602 Shortness of breath: Secondary | ICD-10-CM | POA: Diagnosis not present

## 2014-04-25 DIAGNOSIS — J219 Acute bronchiolitis, unspecified: Secondary | ICD-10-CM | POA: Diagnosis not present

## 2014-04-25 DIAGNOSIS — R0902 Hypoxemia: Secondary | ICD-10-CM

## 2014-04-25 DIAGNOSIS — G629 Polyneuropathy, unspecified: Secondary | ICD-10-CM | POA: Diagnosis present

## 2014-04-25 DIAGNOSIS — F17210 Nicotine dependence, cigarettes, uncomplicated: Secondary | ICD-10-CM | POA: Diagnosis present

## 2014-04-25 DIAGNOSIS — J9601 Acute respiratory failure with hypoxia: Secondary | ICD-10-CM | POA: Diagnosis present

## 2014-04-25 DIAGNOSIS — J441 Chronic obstructive pulmonary disease with (acute) exacerbation: Secondary | ICD-10-CM

## 2014-04-25 LAB — CBC WITH DIFFERENTIAL/PLATELET
Basophils Absolute: 0.1 10*3/uL (ref 0.0–0.1)
Basophils Relative: 1 % (ref 0–1)
EOS ABS: 0.9 10*3/uL — AB (ref 0.0–0.7)
Eosinophils Relative: 7 % — ABNORMAL HIGH (ref 0–5)
HCT: 43.9 % (ref 36.0–46.0)
Hemoglobin: 14.2 g/dL (ref 12.0–15.0)
LYMPHS PCT: 17 % (ref 12–46)
Lymphs Abs: 2.3 10*3/uL (ref 0.7–4.0)
MCH: 28.1 pg (ref 26.0–34.0)
MCHC: 32.3 g/dL (ref 30.0–36.0)
MCV: 86.9 fL (ref 78.0–100.0)
Monocytes Absolute: 1 10*3/uL (ref 0.1–1.0)
Monocytes Relative: 7 % (ref 3–12)
Neutro Abs: 9 10*3/uL — ABNORMAL HIGH (ref 1.7–7.7)
Neutrophils Relative %: 68 % (ref 43–77)
PLATELETS: 281 10*3/uL (ref 150–400)
RBC: 5.05 MIL/uL (ref 3.87–5.11)
RDW: 14.9 % (ref 11.5–15.5)
WBC: 13.2 10*3/uL — ABNORMAL HIGH (ref 4.0–10.5)

## 2014-04-25 LAB — BASIC METABOLIC PANEL
ANION GAP: 10 (ref 5–15)
BUN: 8 mg/dL (ref 6–23)
CO2: 25 mmol/L (ref 19–32)
Calcium: 8.5 mg/dL (ref 8.4–10.5)
Chloride: 104 mmol/L (ref 96–112)
Creatinine, Ser: 0.69 mg/dL (ref 0.50–1.10)
GFR calc non Af Amer: >90 mL/min (ref >90)
GLUCOSE: 129 mg/dL — AB (ref 70–99)
Potassium: 3.3 mmol/L — ABNORMAL LOW (ref 3.5–5.1)
Sodium: 139 mmol/L (ref 135–145)

## 2014-04-25 LAB — BRAIN NATRIURETIC PEPTIDE: B Natriuretic Peptide: 79.7 pg/mL (ref 0.0–100.0)

## 2014-04-25 LAB — TROPONIN I: Troponin I: <0.03 ng/mL (ref <0.031)

## 2014-04-25 MED ORDER — IPRATROPIUM BROMIDE 0.02 % IN SOLN
0.5000 mg | RESPIRATORY_TRACT | Status: AC
  Administered 2014-04-25: 0.5 mg via RESPIRATORY_TRACT

## 2014-04-25 MED ORDER — ALBUTEROL SULFATE (2.5 MG/3ML) 0.083% IN NEBU
INHALATION_SOLUTION | RESPIRATORY_TRACT | Status: AC
  Administered 2014-04-25: 5 mg via RESPIRATORY_TRACT
  Filled 2014-04-25: qty 6

## 2014-04-25 MED ORDER — ALBUTEROL SULFATE (2.5 MG/3ML) 0.083% IN NEBU
5.0000 mg | INHALATION_SOLUTION | RESPIRATORY_TRACT | Status: AC
  Administered 2014-04-25: 5 mg via RESPIRATORY_TRACT

## 2014-04-25 MED ORDER — IPRATROPIUM BROMIDE 0.02 % IN SOLN
RESPIRATORY_TRACT | Status: AC
  Administered 2014-04-25: 0.5 mg via RESPIRATORY_TRACT
  Filled 2014-04-25: qty 2.5

## 2014-04-25 MED ORDER — IPRATROPIUM-ALBUTEROL 0.5-2.5 (3) MG/3ML IN SOLN: 3.0000 mL | RESPIRATORY_TRACT | Status: DC

## 2014-04-25 MED ORDER — DEXTROSE 5 % IV SOLN
100.0000 mg | Freq: Once | INTRAVENOUS | Status: DC
  Filled 2014-04-25: qty 100

## 2014-04-25 MED ORDER — PREDNISONE 50 MG PO TABS
60.0000 mg | ORAL_TABLET | Freq: Once | ORAL | Status: AC
  Administered 2014-04-25: 60 mg via ORAL
  Filled 2014-04-25 (×2): qty 1

## 2014-04-25 MED ORDER — ALBUTEROL SULFATE (2.5 MG/3ML) 0.083% IN NEBU
INHALATION_SOLUTION | RESPIRATORY_TRACT | Status: AC
  Administered 2014-04-25: 5 mg via RESPIRATORY_TRACT
  Filled 2014-04-25: qty 15

## 2014-04-25 NOTE — ED Notes (Signed)
Pt reports SOB last several weeks that have worsened tonight O2 sat room air 88%

## 2014-04-25 NOTE — Progress Notes (Signed)
Pt ambulated in dept. SATS dropped as low as 82% on room air. Pt taken back to her room and placed back on 2lt [place]. MD informed.

## 2014-04-25 NOTE — ED Notes (Signed)
I took patient to triage room for respiratory,  to start breathing treatment  As room was being cleaned. Patient was placed on mask at 8 % oxygen.

## 2014-04-25 NOTE — ED Provider Notes (Signed)
CSN: [id number]     Arrival date & time 04/25/14  2127 History  This chart was scribed for Glynn Octave, MD by Tonye Royalty, ED Scribe. This patient was seen in room MH01/MH01 and the patient's care was started at 9:38 PM.    Chief Complaint  Patient presents with  . Shortness of Breath   The history is provided by the patient. No language interpreter was used.    HPI Comments: Rita Watkins is a 59 y.o. female who presents to the Emergency Department complaining of SOB with onset 1 week ago, beginning with cough and rhinorrhea. She initially thought it was allergies. She reports associated fever measured at 100.5 and decreased appetite. She was evaluated by a doctor 2 days ago and diagnosed with sinusitis; she was prescribed Augmentin, ProAir inhaler, and nasal spray. She has used the inhaler several times today. Friend made her come in tonight. She states she is improved after beginning breathing treatment here and no longer has pain with deep breath. She smokes but denies diagnoses of asthma or COPD. She is trying to quit smoking and is at 1/2 ppd. She denies history of cardiac problems or DM but has HTN. She states she has never been hospitalized for respiratory problem.  She states she is prescribed inhalers in the spring for allergies. She does not use O2 at home. She denies recent long plane or car trips. She states she has had 1 ill contact at work. She denies vomiting or chest pain.  PCP: Dow Adolph, MD   Past Medical History  Diagnosis Date  . Allergy   . Asthma   . Hypertension   . Hyperlipidemia   . Depression   . Anxiety    Past Surgical History  Procedure Laterality Date  . Slipped capital femoral epiphysis pinning     Family History  Problem Relation Age of Onset  . Hypertension Mother   . Hyperlipidemia Mother   . Hypertension Brother   . COPD Brother    History  Substance Use Topics  . Smoking status: Current Every Day Smoker -- 0.50 packs/day for 35 years     Types: Cigarettes  . Smokeless tobacco: Not on file  . Alcohol Use: No   OB History    No data available     Review of Systems    Allergies  Terbinafine hcl; Avelox; Montelukast sodium; and Zocor  Home Medications   Prior to Admission medications   Medication Sig Start Date End Date Taking? Authorizing Provider  albuterol (PROAIR HFA) 108 (90 BASE) MCG/ACT inhaler 2 puffs by mouth 4 times a day as needed for symptoms. 09/12/13  Yes Maurice March, MD  ALPRAZolam Prudy Feeler) 0.5 MG tablet Take 1 tablet in morning and 1 tablet at bedtime for sleep. 01/16/14  Yes Maurice March, MD  amLODipine-benazepril (LOTREL) 10-40 MG per capsule TAKE 1 CAPSULE DAILY 09/12/13  Yes Maurice March, MD  gabapentin (NEURONTIN) 400 MG capsule Take 1 capsule at bedtime or as directed. 01/16/14  Yes Huey Bienenstock McVeigh, PA  PARoxetine (PAXIL) 20 MG tablet Take 1 tablet (20 mg total) by mouth every morning. 01/16/14  Yes Huey Bienenstock McVeigh, PA  pravastatin (PRAVACHOL) 20 MG tablet  11/01/13  Yes Historical Provider, MD  Vitamin D, Ergocalciferol, (DRISDOL) 50000 UNITS CAPS capsule TAKE 1 CAPSULE ONCE A WEEK 01/16/14  Yes Huey Bienenstock McVeigh, PA  zoster vaccine live, PF, (ZOSTAVAX) [id number] UNT/0.65ML injection Inject 19,400 Units into the skin once. 01/16/14   Tawanna Cooler  M McVeigh, PA   BP 143/77 mmHg  Pulse 77  Temp(Src) 98.9 F (37.2 C) (Oral)  Resp 20  Ht 5\' 9"  (1.753 m)  Wt 286 lb (129.729 kg)  BMI 42.22 kg/m2  SpO2 89% Physical Exam  Constitutional: She is oriented to person, place, and time. She appears well-developed. No distress.  Obese  HENT:  Head: Normocephalic and atraumatic.  Mouth/Throat: Oropharynx is clear and moist. No oropharyngeal exudate.  Eyes: Conjunctivae and EOM are normal. Pupils are equal, round, and reactive to light.  Neck: Normal range of motion. Neck supple.  No meningismus.  Cardiovascular: Normal rate, regular rhythm, normal heart sounds and intact distal pulses.   No murmur  heard. Pulmonary/Chest: Effort normal. No respiratory distress. She has wheezes.  Speaking in full sentences Fair air exchange with scattered expiratory wheezing  Abdominal: Soft. There is no tenderness. There is no rebound and no guarding.  Musculoskeletal: Normal range of motion. She exhibits no edema (No peripheral edema) or tenderness.  Neurological: She is alert and oriented to person, place, and time. No cranial nerve deficit. She exhibits normal muscle tone. Coordination normal.  No ataxia on finger to nose bilaterally. No pronator drift. 5/5 strength throughout. CN 2-12 intact. Negative Romberg. Equal grip strength. Sensation intact. Gait is normal.   Skin: Skin is warm.  Psychiatric: She has a normal mood and affect. Her behavior is normal.  Nursing note and vitals reviewed.   ED Course  Procedures (including critical care time)  DIAGNOSTIC STUDIES: Oxygen Saturation is 88% on room air, low by my interpretation.    COORDINATION OF CARE: 9:47 PM Discussed treatment plan with patient at beside, including breathing treatment and chest x-ray. The patient agrees with the plan and has no further questions at this time.   Labs Review Labs Reviewed  CBC WITH DIFFERENTIAL/PLATELET - Abnormal; Notable for the following:    WBC 13.2 (*)    Neutro Abs 9.0 (*)    Eosinophils Relative 7 (*)    Eosinophils Absolute 0.9 (*)    All other components within normal limits  BASIC METABOLIC PANEL - Abnormal; Notable for the following:    Potassium 3.3 (*)    Glucose, Bld 129 (*)    All other components within normal limits  TROPONIN I  BRAIN NATRIURETIC PEPTIDE    Imaging Review Dg Chest 2 View  04/25/2014   CLINICAL DATA:  Dyspnea for several weeks, worsened tonight.  EXAM: CHEST  2 VIEW  COMPARISON:  None.  FINDINGS: There is marked hyperinflation. There is generalized interstitial coarsening, likely chronic. No airspace consolidation or effusion. Heart size is upper normal.   IMPRESSION: COPD.  No acute cardiopulmonary findings.   Electronically Signed   By: Ellery Plunkaniel R Mitchell M.D.   On: 04/25/2014 22:34     EKG Interpretation None      MDM   Final diagnoses:  COPD exacerbation  Hypoxia   Shortness of breath onset 1 week ago that worsened tonight. Hypoxic on room air to 88%. Denies chest pain or fever. Seen at urgent care and treated for sinusitis and bronchitis with Augmentin. She denies any history of COPD or asthma but is a smoker. No previous hospitalizations for breathing difficulties.  Nebulizers, steroids with her exchange with faint x-ray wheezing. Chest x-ray negative.  Patient desaturates to 89% at rest. Drops to 84% with ambulation. She does not wear oxygen at home. She will require admission for ongoing treatments given her new oxygen requirement. Continue steroids, antibiotics, and bronchodilators.  Doubt PE or ACS. D/w Dr. Criselda Peaches    ED ECG REPORT   Date: 04/25/2014  Rate: 64  Rhythm: normal sinus rhythm  QRS Axis: normal  Intervals: normal  ST/T Wave abnormalities: normal  Conduction Disutrbances:none  Narrative Interpretation:   Old EKG Reviewed: none available  I have personally reviewed the EKG tracing and agree with the computerized printout as noted.   I personally performed the services described in this documentation, which was scribed in my presence. The recorded information has been reviewed and is accurate.   Glynn Octave, MD 04/26/14 [phone]

## 2014-04-25 NOTE — Progress Notes (Signed)
Pt given Albuterol/Atrovent Neb. Pt ambulated once more after the treatment with the same results of 84-88% on room air. Pt assisted back to room and placed back on 2lt [place].

## 2014-04-26 ENCOUNTER — Encounter (HOSPITAL_COMMUNITY): Payer: Self-pay

## 2014-04-26 DIAGNOSIS — F418 Other specified anxiety disorders: Secondary | ICD-10-CM

## 2014-04-26 DIAGNOSIS — G629 Polyneuropathy, unspecified: Secondary | ICD-10-CM | POA: Diagnosis present

## 2014-04-26 DIAGNOSIS — I10 Essential (primary) hypertension: Secondary | ICD-10-CM

## 2014-04-26 DIAGNOSIS — J9601 Acute respiratory failure with hypoxia: Secondary | ICD-10-CM

## 2014-04-26 DIAGNOSIS — J218 Acute bronchiolitis due to other specified organisms: Secondary | ICD-10-CM

## 2014-04-26 DIAGNOSIS — J45909 Unspecified asthma, uncomplicated: Secondary | ICD-10-CM | POA: Diagnosis present

## 2014-04-26 DIAGNOSIS — J219 Acute bronchiolitis, unspecified: Secondary | ICD-10-CM | POA: Diagnosis present

## 2014-04-26 DIAGNOSIS — E785 Hyperlipidemia, unspecified: Secondary | ICD-10-CM | POA: Diagnosis present

## 2014-04-26 DIAGNOSIS — Z888 Allergy status to other drugs, medicaments and biological substances status: Secondary | ICD-10-CM | POA: Diagnosis not present

## 2014-04-26 DIAGNOSIS — F17210 Nicotine dependence, cigarettes, uncomplicated: Secondary | ICD-10-CM | POA: Diagnosis present

## 2014-04-26 DIAGNOSIS — R0602 Shortness of breath: Secondary | ICD-10-CM | POA: Diagnosis present

## 2014-04-26 LAB — COMPREHENSIVE METABOLIC PANEL
ALBUMIN: 3.5 g/dL (ref 3.5–5.2)
ALK PHOS: 108 U/L (ref 39–117)
ALT: 15 U/L (ref 0–35)
AST: 22 U/L (ref 0–37)
Anion gap: 5 (ref 5–15)
BILIRUBIN TOTAL: 0.5 mg/dL (ref 0.3–1.2)
BUN: 6 mg/dL (ref 6–23)
CHLORIDE: 104 mmol/L (ref 96–112)
CO2: 30 mmol/L (ref 19–32)
CREATININE: 0.72 mg/dL (ref 0.50–1.10)
Calcium: 8.9 mg/dL (ref 8.4–10.5)
GFR calc Af Amer: >90 mL/min (ref >90)
GFR calc non Af Amer: >90 mL/min (ref >90)
GLUCOSE: 191 mg/dL — AB (ref 70–99)
POTASSIUM: 3.4 mmol/L — AB (ref 3.5–5.1)
Sodium: 139 mmol/L (ref 135–145)
Total Protein: 8 g/dL (ref 6.0–8.3)

## 2014-04-26 LAB — CBC
HCT: 43.9 % (ref 36.0–46.0)
HEMOGLOBIN: 14.4 g/dL (ref 12.0–15.0)
MCH: 28.6 pg (ref 26.0–34.0)
MCHC: 32.8 g/dL (ref 30.0–36.0)
MCV: 87.1 fL (ref 78.0–100.0)
Platelets: 247 10*3/uL (ref 150–400)
RBC: 5.04 MIL/uL (ref 3.87–5.11)
RDW: 14.7 % (ref 11.5–15.5)
WBC: 9.9 10*3/uL (ref 4.0–10.5)

## 2014-04-26 LAB — PROTIME-INR
INR: 1.09 (ref 0.00–1.49)
PROTHROMBIN TIME: 14.2 s (ref 11.6–15.2)

## 2014-04-26 LAB — MAGNESIUM: Magnesium: 1.9 mg/dL (ref 1.5–2.5)

## 2014-04-26 LAB — INFLUENZA PANEL BY PCR (TYPE A & B)
H1N1FLUPCR: NOT DETECTED
Influenza A By PCR: NEGATIVE
Influenza B By PCR: NEGATIVE

## 2014-04-26 LAB — STREP PNEUMONIAE URINARY ANTIGEN: Strep Pneumo Urinary Antigen: NEGATIVE

## 2014-04-26 MED ORDER — NICOTINE 7 MG/24HR TD PT24
7.0000 mg | MEDICATED_PATCH | Freq: Every day | TRANSDERMAL | Status: DC
  Administered 2014-04-26 – 2014-04-27 (×2): 7 mg via TRANSDERMAL
  Filled 2014-04-26 (×2): qty 1

## 2014-04-26 MED ORDER — ACETAMINOPHEN 325 MG PO TABS: 650.0000 mg | ORAL_TABLET | Freq: Four times a day (QID) | ORAL | Status: DC | PRN

## 2014-04-26 MED ORDER — ACETAMINOPHEN 650 MG RE SUPP: 650.0000 mg | Freq: Four times a day (QID) | RECTAL | Status: DC | PRN

## 2014-04-26 MED ORDER — GUAIFENESIN ER 600 MG PO TB12
600.0000 mg | ORAL_TABLET | Freq: Two times a day (BID) | ORAL | Status: DC
  Administered 2014-04-26 – 2014-04-27 (×3): 600 mg via ORAL
  Filled 2014-04-26 (×4): qty 1

## 2014-04-26 MED ORDER — DOXYCYCLINE HYCLATE 100 MG IV SOLR
100.0000 mg | Freq: Two times a day (BID) | INTRAVENOUS | Status: DC
  Administered 2014-04-26 – 2014-04-27 (×3): 100 mg via INTRAVENOUS
  Filled 2014-04-26 (×4): qty 100

## 2014-04-26 MED ORDER — POTASSIUM CHLORIDE CRYS ER 20 MEQ PO TBCR
40.0000 meq | EXTENDED_RELEASE_TABLET | Freq: Once | ORAL | Status: AC
  Administered 2014-04-26: 40 meq via ORAL
  Filled 2014-04-26: qty 2

## 2014-04-26 MED ORDER — GABAPENTIN 400 MG PO CAPS
400.0000 mg | ORAL_CAPSULE | Freq: Every day | ORAL | Status: DC
  Administered 2014-04-26: 400 mg via ORAL
  Filled 2014-04-26 (×2): qty 1

## 2014-04-26 MED ORDER — AMLODIPINE BESYLATE 10 MG PO TABS
10.0000 mg | ORAL_TABLET | Freq: Every day | ORAL | Status: DC
  Administered 2014-04-26 – 2014-04-27 (×2): 10 mg via ORAL
  Filled 2014-04-26 (×2): qty 1

## 2014-04-26 MED ORDER — ENOXAPARIN SODIUM 80 MG/0.8ML ~~LOC~~ SOLN
80.0000 mg | Freq: Every day | SUBCUTANEOUS | Status: DC
  Administered 2014-04-26 – 2014-04-27 (×2): 80 mg via SUBCUTANEOUS
  Filled 2014-04-26 (×2): qty 0.8

## 2014-04-26 MED ORDER — PREDNISONE 20 MG PO TABS
40.0000 mg | ORAL_TABLET | Freq: Every day | ORAL | Status: DC
  Administered 2014-04-27: 40 mg via ORAL
  Filled 2014-04-26 (×2): qty 2

## 2014-04-26 MED ORDER — SODIUM CHLORIDE 0.9 % IJ SOLN
3.0000 mL | Freq: Two times a day (BID) | INTRAMUSCULAR | Status: DC
  Administered 2014-04-26 – 2014-04-27 (×3): 3 mL via INTRAVENOUS

## 2014-04-26 MED ORDER — ALBUTEROL SULFATE (2.5 MG/3ML) 0.083% IN NEBU
2.5000 mg | INHALATION_SOLUTION | Freq: Four times a day (QID) | RESPIRATORY_TRACT | Status: DC | PRN
  Administered 2014-04-26: 2.5 mg via RESPIRATORY_TRACT
  Filled 2014-04-26: qty 3

## 2014-04-26 MED ORDER — AMLODIPINE BESY-BENAZEPRIL HCL 10-40 MG PO CAPS: 1.0000 | ORAL_CAPSULE | Freq: Every day | ORAL | Status: DC

## 2014-04-26 MED ORDER — PAROXETINE HCL 20 MG PO TABS
20.0000 mg | ORAL_TABLET | Freq: Every day | ORAL | Status: DC
  Administered 2014-04-26 – 2014-04-27 (×2): 20 mg via ORAL
  Filled 2014-04-26 (×2): qty 1

## 2014-04-26 MED ORDER — ONDANSETRON HCL 4 MG/2ML IJ SOLN: 4.0000 mg | Freq: Four times a day (QID) | INTRAMUSCULAR | Status: DC | PRN

## 2014-04-26 MED ORDER — METHYLPREDNISOLONE SODIUM SUCC 125 MG IJ SOLR
60.0000 mg | Freq: Two times a day (BID) | INTRAMUSCULAR | Status: AC
  Administered 2014-04-26: 60 mg via INTRAVENOUS
  Filled 2014-04-26: qty 0.96
  Filled 2014-04-26: qty 2

## 2014-04-26 MED ORDER — METHYLPREDNISOLONE SODIUM SUCC 125 MG IJ SOLR
60.0000 mg | Freq: Two times a day (BID) | INTRAMUSCULAR | Status: DC
  Administered 2014-04-26: 60 mg via INTRAVENOUS
  Filled 2014-04-26: qty 0.96

## 2014-04-26 MED ORDER — IPRATROPIUM-ALBUTEROL 0.5-2.5 (3) MG/3ML IN SOLN
3.0000 mL | RESPIRATORY_TRACT | Status: DC
  Administered 2014-04-26 – 2014-04-27 (×8): 3 mL via RESPIRATORY_TRACT
  Filled 2014-04-26 (×8): qty 3

## 2014-04-26 MED ORDER — ONDANSETRON HCL 4 MG PO TABS: 4.0000 mg | ORAL_TABLET | Freq: Four times a day (QID) | ORAL | Status: DC | PRN

## 2014-04-26 MED ORDER — BENAZEPRIL HCL 40 MG PO TABS
40.0000 mg | ORAL_TABLET | Freq: Every day | ORAL | Status: DC
  Administered 2014-04-26 – 2014-04-27 (×2): 40 mg via ORAL
  Filled 2014-04-26 (×2): qty 1

## 2014-04-26 MED ORDER — SODIUM CHLORIDE 0.9 % IV SOLN
INTRAVENOUS | Status: DC
  Administered 2014-04-26: 01:00:00 via INTRAVENOUS

## 2014-04-26 MED ORDER — ALPRAZOLAM 0.5 MG PO TABS
0.5000 mg | ORAL_TABLET | Freq: Two times a day (BID) | ORAL | Status: DC | PRN
  Administered 2014-04-26 – 2014-04-27 (×2): 0.5 mg via ORAL
  Filled 2014-04-26 (×2): qty 1

## 2014-04-26 NOTE — Progress Notes (Addendum)
Called re: [facility] at [address].   Rita Watkins is a 58yo smoker with no apparent history of COPD/asthma, presents for 1 week of SOB.  Treated outpatient with augmentin and pro-air.  Came in wheezing, tachypneic, hypoxic (pulse ox to 80s with ambulation), improved with breathing treatment and oxygen.  WBC elevated prior to steroid administration (one dose in ED).  Likely COPD exacerbation.  Transfer on oxygen to telemetry bed.    Debe CoderMULLEN, Bralyn Folkert, MD

## 2014-04-26 NOTE — Progress Notes (Signed)
New Admission Note:   Arrival Method: per stretcher from [facility] ED accompanied by EMS Mental Orientation: alert, oriented X4, pleasant and conversant Telemetry: placed on telebox 30 after CCMD notified Assessment: Completed Skin: warm, dry, intact with no wounds noted. Both heels are dry but flaky. IV: G20 on the left AC with transparent dressing, clean, dry and intact Pain: denies having any pain as of this time Tubes: IV line with NS infusing well Safety Measures: Safety Fall Prevention Plan has been given, discussed and signed Admission: Completed [address] Orientation: Patient has been orientated to the room, unit and staff.  Family: no family member noted at bedside  Orders have been reviewed and implemented. Will continue to monitor the patient. Call light has been placed within reach and bed alarm has been activated.   Janice NorrieAnessa Rober Skeels BSN, RN [facility] [address] [place] [phone]

## 2014-04-26 NOTE — H&P (Signed)
[facility] History and Physical  Patient: Rita Watkins  MRN: [id number]  DOB: 1955-10-17  DOS: the patient was seen and examined on 04/26/2014 PCP: Dow AdolphMCPHERSON,BARBARA, MD  Chief Complaint: Shortness of breath and cough  HPI: Rita Watkins is a 59 y.o. female with Past medical history of hypertension, depression, anxiety, dyslipidemia, neuropathy, history of obstructive lung disease ) of seasonal allergies. The patient is presenting with complaints of shortness of breath and cough. She mentions that in [place] she generally has shortness of breath with wheezing and her PCP gives her albuterol inhaler. Since last Thursday she has been having progressively worsening wheezing and has went to [facility]. She was given Augmentin and albuterol inhaler ED at Despite taking that her symptoms are not improved and therefore she came to the ER. She denies any chest pain fever or chills. She complains of a dry cough. She denies any leg tenderness or recent travel. She has chronic leg swelling. She denies any abdominal pain. No diarrhea nausea vomiting or choking episode. No sick contact or travel outside of states. Patient is active smoker currently smoking 10 cigarettes a day  The patient is coming from home. And at her baseline independent for most of her ADL.  Review of Systems: as mentioned in the history of present illness.  A Comprehensive review of the other systems is negative.  Past Medical History  Diagnosis Date  . Allergy   . Asthma   . Hypertension   . Hyperlipidemia   . Depression   . Anxiety    Past Surgical History  Procedure Laterality Date  . Slipped capital femoral epiphysis pinning     Social History:  reports that she has been smoking Cigarettes.  She has a 17.5 pack-year smoking history. She does not have any smokeless tobacco history on file. She reports that she does not drink alcohol or use illicit drugs.  Allergies  Allergen Reactions  . Terbinafine Hcl  Swelling  . Avelox [Moxifloxacin Hcl In Nacl]   . Montelukast Sodium Other (See Comments)    Insomnia  . Zocor [Simvastatin - High Dose]     Family History  Problem Relation Age of Onset  . Hypertension Mother   . Hyperlipidemia Mother   . Hypertension Brother   . COPD Brother     Prior to Admission medications   Medication Sig Start Date End Date Taking? Authorizing Provider  albuterol (PROAIR HFA) 108 (90 BASE) MCG/ACT inhaler 2 puffs by mouth 4 times a day as needed for symptoms. 09/12/13  Yes Maurice MarchBarbara B McPherson, MD  ALPRAZolam Prudy Feeler(XANAX) 0.5 MG tablet Take 1 tablet in morning and 1 tablet at bedtime for sleep. 01/16/14  Yes Maurice MarchBarbara B McPherson, MD  amLODipine-benazepril (LOTREL) 10-40 MG per capsule TAKE 1 CAPSULE DAILY 09/12/13  Yes Maurice MarchBarbara B McPherson, MD  gabapentin (NEURONTIN) 400 MG capsule Take 1 capsule at bedtime or as directed. 01/16/14  Yes Huey Bienenstockodd M McVeigh, PA  PARoxetine (PAXIL) 20 MG tablet Take 1 tablet (20 mg total) by mouth every morning. 01/16/14  Yes Huey Bienenstockodd M McVeigh, PA  pravastatin (PRAVACHOL) 20 MG tablet  11/01/13  Yes Historical Provider, MD  Vitamin D, Ergocalciferol, (DRISDOL) 50000 UNITS CAPS capsule TAKE 1 CAPSULE ONCE A WEEK 01/16/14  Yes Huey Bienenstockodd M McVeigh, PA  zoster vaccine live, PF, (ZOSTAVAX) [id number] UNT/0.65ML injection Inject 19,400 Units into the skin once. 01/16/14   Donnajean Lopesodd M McVeigh, PA    Physical Exam: Ceasar MonsFiled Vitals:   04/26/14 [id number] 04/26/14 [id number] 04/26/14 [id number]  04/26/14 0629  BP:  163/60  127/97  Pulse: 80 75  73  Temp:  97.6 F (36.4 C)  98.4 F (36.9 C)  TempSrc:      Resp:  18  21  Height:  5' 9.5" (1.765 m)    Weight:  163.749 kg (361 lb)    SpO2: 92% 94% 95% 91%    General: Alert, Awake and Oriented to Time, Place and Person. Appear in mild distress Eyes: PERRL ENT: Oral Mucosa clear moist. Neck: Difficult to assessD Cardiovascular: S1 and S2 Present, no Murmur, Peripheral Pulses Present Respiratory: Bilateral Air entry equal and Decreased,  extensive expiratory rhonchi and wheezes Abdomen: Bowel Sound present, Soft and non tender Skin: no Rash Extremities: Trace Pedal edema, no calf tenderness Neurologic: Grossly no focal neuro deficit.  Labs on Admission:  CBC:  Recent Labs Lab 04/25/14 2215  WBC 13.2*  NEUTROABS 9.0*  HGB 14.2  HCT 43.9  MCV 86.9  PLT 281    CMP     Component Value Date/Time   NA 139 04/25/2014 2215   K 3.3* 04/25/2014 2215   CL 104 04/25/2014 2215   CO2 25 04/25/2014 2215   GLUCOSE 129* 04/25/2014 2215   BUN 8 04/25/2014 2215   CREATININE 0.69 04/25/2014 2215   CREATININE 0.73 01/16/2014 1131   CALCIUM 8.5 04/25/2014 2215   PROT 7.2 01/17/2013 1032   ALBUMIN 3.5 01/17/2013 1032   AST 17 01/17/2013 1032   ALT 10 01/17/2013 1032   ALKPHOS 99 01/17/2013 1032   BILITOT 0.5 01/17/2013 1032   GFRNONAA >90 04/25/2014 2215   GFRNONAA >89 01/17/2013 1032   GFRAA >90 04/25/2014 2215   GFRAA >89 01/17/2013 1032    No results for input(s): LIPASE, AMYLASE in the last 168 hours.   Recent Labs Lab 04/25/14 2215  TROPONINI <0.03   BNP (last 3 results)  Recent Labs  04/25/14 2215  BNP 79.7    ProBNP (last 3 results) No results for input(s): PROBNP in the last 8760 hours.   Radiological Exams on Admission: Dg Chest 2 View  04/25/2014   CLINICAL DATA:  Dyspnea for several weeks, worsened tonight.  EXAM: CHEST  2 VIEW  COMPARISON:  None.  FINDINGS: There is marked hyperinflation. There is generalized interstitial coarsening, likely chronic. No airspace consolidation or effusion. Heart size is upper normal.  IMPRESSION: COPD.  No acute cardiopulmonary findings.   Electronically Signed   By: Ellery Plunk M.D.   On: 04/25/2014 22:34    Assessment/Plan Principal Problem:   Acute bronchiolitis due to other infectious organisms Active Problems:   HTN (hypertension)   Depression with anxiety   Acute respiratory failure with hypoxia   1. Acute bronchiolitis due to other  infectious organisms The patient presented with complaints of shortness of breath, cough; she has expiratory wheezing, respiratory distress, tachypnea, with use of accessory muscles of respiration, hypoxia. Requiring 3 L of oxygen to maintain saturations above 90   Chest x-ray is clear for any acute abnormality It shows obstructive lung disease. Patient is not workup with PFTs in the past. Unclear whether this asthma or COPD. Currently she was treated with duo nebs, Solu-Medrol, doxycycline which has been initiated in the ED, Mucinex. Follow cultures and influenza PCR. Patient will need outpatient workup with PCP and pulmonologist for diagnosing the etiology of her obstructive lung disease  2.Hypertension. Continue home medication.  3. Depression. Continue home treatment.  4. Neuropathy. Continue gabapentin.  Advance goals of care discussion: Full  code   DVT Prophylaxis: subcutaneous Heparin. Nutrition: Cardiac diet  Disposition: Admitted to inpatient in telemetry unit.  Author: Lynden Oxford, MD [facility] Pager: [phone] 04/26/2014, 6:37 AM    If 7PM-7AM, please contact night-coverage [email or web address] Password [email or web address]

## 2014-04-26 NOTE — Progress Notes (Signed)
Patient admitted after midnight- please see H&P.  Breathing "150% better" per patient- change to PO prednisone in AM and possibly d/c.  Marlin CanaryJessica Lupe Handley DO

## 2014-04-26 NOTE — Progress Notes (Signed)
SATURATION QUALIFICATIONS: (This note is used to comply with regulatory documentation for home oxygen)  Patient Saturations on Room Air at Rest = 95%  Patient Saturations on Room Air while Ambulating = 90%  Patient Saturations on N/A Liters of oxygen while Ambulating = N/A  Please briefly explain why patient needs home oxygen: 

## 2014-04-26 NOTE — Progress Notes (Signed)
TED hose as VTE was ordered for the pt. Pt stated "This hurts. I'm not comfortable" after RN applied TED hose on both legs. Pt wanted it to be taken off. Education given regarding the benefits of wearing TED hose while on bed. Pt's refusal noted. Will continue to monitor.

## 2014-04-27 LAB — LEGIONELLA ANTIGEN, URINE

## 2014-04-27 MED ORDER — NICOTINE 7 MG/24HR TD PT24: 7.0000 mg | MEDICATED_PATCH | Freq: Every day | TRANSDERMAL | Status: DC

## 2014-04-27 MED ORDER — PREDNISONE 10 MG PO TABS: ORAL_TABLET | ORAL | Status: DC

## 2014-04-27 MED ORDER — DOXYCYCLINE HYCLATE 50 MG PO CAPS: 50.0000 mg | ORAL_CAPSULE | Freq: Two times a day (BID) | ORAL | Status: DC

## 2014-04-27 MED ORDER — POTASSIUM CHLORIDE CRYS ER 20 MEQ PO TBCR
40.0000 meq | EXTENDED_RELEASE_TABLET | Freq: Once | ORAL | Status: AC
  Administered 2014-04-27: 40 meq via ORAL
  Filled 2014-04-27: qty 2

## 2014-04-27 NOTE — Discharge Summary (Signed)
Physician Discharge Summary  Rita Watkins:[id number] DOB: 04/07/1955 DOA: 04/25/2014  PCP: Dow Adolph, MD  Admit date: 04/25/2014 Discharge date: 04/27/2014  Time spent: 35  minutes  Recommendations for Outpatient Follow-up:  1. Stop smoking  2. PFTs once acute exacerbation over  Discharge Diagnoses:  Principal Problem:   Acute bronchiolitis due to other infectious organisms Active Problems:   HTN (hypertension)   Depression with anxiety   Acute respiratory failure with hypoxia   Discharge Condition: improved  Diet recommendation: cardiac  [facility] Weights   04/25/14 2139 04/26/14 0239  Weight: 129.729 kg (286 lb) 163.749 kg (361 lb)    History of present illness:  Rita Watkins is a 59 y.o. female with Past medical history of hypertension, depression, anxiety, dyslipidemia, neuropathy, history of obstructive lung disease ) of seasonal allergies. The patient is presenting with complaints of shortness of breath and cough. She mentions that in [place] she generally has shortness of breath with wheezing and her PCP gives her albuterol inhaler. Since last Thursday she has been having progressively worsening wheezing and has went [facility]. She was given Augmentin and albuterol inhaler ED at Despite taking that her symptoms are not improved and therefore she came to the ER. She denies any chest pain fever or chills. She complains of a dry cough. She denies any leg tenderness or recent travel. She has chronic leg swelling. She denies any abdominal pain. No diarrhea nausea vomiting or choking episode. No sick contact or travel outside of states. Patient is active smoker currently smoking 10 cigarettes a day  Hospital Course:  Acute bronchiolitis due to other infectious organisms -nebs, abx, steroids Patient will need outpatient workup with PCP and pulmonologist for diagnosing the etiology of her obstructive lung disease  Tobacco abuse -encourage cessation -given  nicotine patch  Hypertension. Continue home medication.  Depression. Continue home treatment.  Procedures:    Consultations:    Discharge Exam: Filed Vitals:   04/27/14 0614  BP: 137/63  Pulse: 66  Temp: 97.6 F (36.4 C)  Resp: 21    General: A+Ox3, NAD Cardiovascular: rrr Respiratory: no wheezing  Discharge Instructions   Discharge Instructions    Diet - low sodium heart healthy    Complete by:  As directed      Discharge instructions    Complete by:  As directed   Stop smoking see PCP for PFTs     Increase activity slowly    Complete by:  As directed           Current Discharge Medication List    START taking these medications   Details  doxycycline (VIBRAMYCIN) 50 MG capsule Take 1 capsule (50 mg total) by mouth 2 (two) times daily. Qty: 4 capsule, Refills: 0    nicotine (NICODERM CQ - DOSED IN MG/24 HR) 7 mg/24hr patch Place 1 patch (7 mg total) onto the skin daily. Qty: 28 patch, Refills: 0    predniSONE (DELTASONE) 10 MG tablet 40 x 2 days, 30 mg x 2 days, 20 mg x 2 days, 10 mg x 2 days then d/c Qty: 20 tablet, Refills: 0      CONTINUE these medications which have NOT CHANGED   Details  albuterol (PROAIR HFA) 108 (90 BASE) MCG/ACT inhaler 2 puffs by mouth 4 times a day as needed for symptoms. Qty: 3 Inhaler, Refills: 3    ALPRAZolam (XANAX) 0.5 MG tablet Take 1 tablet in morning and 1 tablet at bedtime for sleep. Qty: 60 tablet,  Refills: 2    amLODipine-benazepril (LOTREL) 10-40 MG per capsule TAKE 1 CAPSULE DAILY Qty: 90 capsule, Refills: 3    gabapentin (NEURONTIN) 400 MG capsule Take 1 capsule at bedtime or as directed. Qty: 90 capsule, Refills: 3    PARoxetine (PAXIL) 20 MG tablet Take 1 tablet (20 mg total) by mouth every morning. Qty: 90 tablet, Refills: 3   Associated Diagnoses: Depression with anxiety    pravastatin (PRAVACHOL) 20 MG tablet Take 20 mg by mouth daily.  Refills: 5    Vitamin D, Ergocalciferol, (DRISDOL) 50000  UNITS CAPS capsule TAKE 1 CAPSULE ONCE A WEEK Qty: 12 capsule, Refills: 1      STOP taking these medications     zoster vaccine live, PF, (ZOSTAVAX) [id number] UNT/0.65ML injection      amoxicillin-clavulanate (AUGMENTIN) 875-125 MG per tablet        Allergies  Allergen Reactions  . Terbinafine Hcl Swelling  . Avelox [Moxifloxacin Hcl In Nacl]   . Montelukast Sodium Other (See Comments)    Insomnia  . Zocor [Simvastatin - High Dose]       The results of significant diagnostics from this hospitalization (including imaging, microbiology, ancillary and laboratory) are listed below for reference.    Significant Diagnostic Studies: Dg Chest 2 View  04/25/2014   CLINICAL DATA:  Dyspnea for several weeks, worsened tonight.  EXAM: CHEST  2 VIEW  COMPARISON:  None.  FINDINGS: There is marked hyperinflation. There is generalized interstitial coarsening, likely chronic. No airspace consolidation or effusion. Heart size is upper normal.  IMPRESSION: COPD.  No acute cardiopulmonary findings.   Electronically Signed   By: Ellery Plunkaniel R Mitchell M.D.   On: 04/25/2014 22:34    Microbiology: No results found for this or any previous visit (from the past 240 hour(s)).   Labs: Basic Metabolic Panel:  Recent Labs Lab 04/25/14 2215 04/26/14 0910  NA 139 139  K 3.3* 3.4*  CL 104 104  CO2 25 30  GLUCOSE 129* 191*  BUN 8 6  CREATININE 0.69 0.72  CALCIUM 8.5 8.9  MG  --  1.9   Liver Function Tests:  Recent Labs Lab 04/26/14 0910  AST 22  ALT 15  ALKPHOS 108  BILITOT 0.5  PROT 8.0  ALBUMIN 3.5   No results for input(s): LIPASE, AMYLASE in the last 168 hours. No results for input(s): AMMONIA in the last 168 hours. CBC:  Recent Labs Lab 04/25/14 2215 04/26/14 0910  WBC 13.2* 9.9  NEUTROABS 9.0*  --   HGB 14.2 14.4  HCT 43.9 43.9  MCV 86.9 87.1  PLT 281 247   Cardiac Enzymes:  Recent Labs Lab 04/25/14 2215  TROPONINI <0.03   BNP: BNP (last 3 results)  Recent Labs   04/25/14 2215  BNP 79.7    ProBNP (last 3 results) No results for input(s): PROBNP in the last 8760 hours.  CBG: No results for input(s): GLUCAP in the last 168 hours.     SignedMarlin Canary:  Fouad Taul  [facility] 04/27/2014, 8:07 AM

## 2014-05-06 ENCOUNTER — Ambulatory Visit: Payer: Self-pay | Admitting: Family Medicine

## 2014-05-15 ENCOUNTER — Ambulatory Visit: Payer: BC Managed Care – PPO | Admitting: Family Medicine

## 2014-05-31 ENCOUNTER — Encounter (HOSPITAL_COMMUNITY): Payer: Self-pay | Admitting: Physician Assistant

## 2014-06-05 ENCOUNTER — Encounter: Payer: Self-pay | Admitting: Family Medicine

## 2014-06-05 ENCOUNTER — Ambulatory Visit (INDEPENDENT_AMBULATORY_CARE_PROVIDER_SITE_OTHER): Payer: BLUE CROSS/BLUE SHIELD

## 2014-06-05 ENCOUNTER — Ambulatory Visit (INDEPENDENT_AMBULATORY_CARE_PROVIDER_SITE_OTHER): Payer: BLUE CROSS/BLUE SHIELD | Admitting: Family Medicine

## 2014-06-05 VITALS — BP 146/70 | HR 73 | Temp 98.3°F | Resp 16 | Ht 69.5 in | Wt 330.2 lb

## 2014-06-05 DIAGNOSIS — F418 Other specified anxiety disorders: Secondary | ICD-10-CM | POA: Diagnosis not present

## 2014-06-05 DIAGNOSIS — J209 Acute bronchitis, unspecified: Secondary | ICD-10-CM

## 2014-06-05 DIAGNOSIS — Z79899 Other long term (current) drug therapy: Secondary | ICD-10-CM | POA: Diagnosis not present

## 2014-06-05 DIAGNOSIS — J309 Allergic rhinitis, unspecified: Secondary | ICD-10-CM | POA: Diagnosis not present

## 2014-06-05 DIAGNOSIS — J441 Chronic obstructive pulmonary disease with (acute) exacerbation: Secondary | ICD-10-CM | POA: Diagnosis not present

## 2014-06-05 DIAGNOSIS — R609 Edema, unspecified: Secondary | ICD-10-CM | POA: Diagnosis not present

## 2014-06-05 DIAGNOSIS — E559 Vitamin D deficiency, unspecified: Secondary | ICD-10-CM | POA: Diagnosis not present

## 2014-06-05 DIAGNOSIS — E785 Hyperlipidemia, unspecified: Secondary | ICD-10-CM | POA: Diagnosis not present

## 2014-06-05 LAB — POCT CBC
GRANULOCYTE PERCENT: 67.1 % (ref 37–80)
HEMATOCRIT: 42 % (ref 37.7–47.9)
HEMOGLOBIN: 13.7 g/dL (ref 12.2–16.2)
Lymph, poc: 3.1 (ref 0.6–3.4)
MCH: 27.7 pg (ref 27–31.2)
MCHC: 32.6 g/dL (ref 31.8–35.4)
MCV: 84.9 fL (ref 80–97)
MID (CBC): 0.7 (ref 0–0.9)
MPV: 6.8 fL (ref 0–99.8)
POC Granulocyte: 7.7 — AB (ref 2–6.9)
POC LYMPH PERCENT: 26.7 %L (ref 10–50)
POC MID %: 6.2 % (ref 0–12)
Platelet Count, POC: 301 10*3/uL (ref 142–424)
RBC: 4.94 M/uL (ref 4.04–5.48)
RDW, POC: 16.5 %
WBC: 11.5 10*3/uL — AB (ref 4.6–10.2)

## 2014-06-05 LAB — POCT SEDIMENTATION RATE: POCT SED RATE: 48 mm/h — AB (ref 0–22)

## 2014-06-05 LAB — GLUCOSE, POCT (MANUAL RESULT ENTRY): POC Glucose: 92 mg/dl (ref 70–99)

## 2014-06-05 MED ORDER — IPRATROPIUM BROMIDE 0.02 % IN SOLN: 0.5000 mg | Freq: Once | RESPIRATORY_TRACT | Status: DC

## 2014-06-05 MED ORDER — PREDNISONE 50 MG PO TABS: 50.0000 mg | ORAL_TABLET | Freq: Every day | ORAL | Status: DC

## 2014-06-05 MED ORDER — ALBUTEROL SULFATE (2.5 MG/3ML) 0.083% IN NEBU: 2.5000 mg | INHALATION_SOLUTION | Freq: Once | RESPIRATORY_TRACT | Status: DC

## 2014-06-05 MED ORDER — AMOXICILLIN-POT CLAVULANATE 875-125 MG PO TABS: 1.0000 | ORAL_TABLET | Freq: Two times a day (BID) | ORAL | Status: DC

## 2014-06-05 MED ORDER — ALPRAZOLAM 0.5 MG PO TABS: ORAL_TABLET | ORAL | Status: DC

## 2014-06-05 MED ORDER — AZITHROMYCIN 500 MG PO TABS: 500.0000 mg | ORAL_TABLET | Freq: Every day | ORAL | Status: DC

## 2014-06-05 MED ORDER — FUROSEMIDE 40 MG PO TABS: 40.0000 mg | ORAL_TABLET | Freq: Every day | ORAL | Status: DC

## 2014-06-05 MED ORDER — ALBUTEROL SULFATE HFA 108 (90 BASE) MCG/ACT IN AERS
1.0000 | INHALATION_SPRAY | RESPIRATORY_TRACT | Status: DC | PRN
Start: 2014-06-05 — End: 2014-06-05

## 2014-06-05 MED ORDER — ALBUTEROL SULFATE HFA 108 (90 BASE) MCG/ACT IN AERS: 1.0000 | INHALATION_SPRAY | RESPIRATORY_TRACT | Status: DC | PRN

## 2014-06-05 MED ORDER — POTASSIUM CHLORIDE CRYS ER 20 MEQ PO TBCR: 20.0000 meq | EXTENDED_RELEASE_TABLET | Freq: Every day | ORAL | Status: DC

## 2014-06-05 NOTE — Progress Notes (Addendum)
Subjective:  This chart was scribed for Rita Sorenson, MD by [name], medical scribe at [facility].The patient was seen in exam room 26 and the patient's care was started at 3:19 PM.   Patient ID: Rita Watkins, female    DOB: April 29, 1955, 59 y.o.   MRN: [id number] Chief Complaint  Patient presents with  . Follow-up    MEDICATION  . Edema    per patient ANKLES and FEET x 1 month   HPI HPI Comments: Rita Watkins is a 59 y.o. female with a history of hypertension and hyperlipidemia who presents to [facility] for a medication refill. She needs her xanax.  Hospitalized over night from 3/20-2/21 for acute bronchiolitis, instructed to quit smoking and have a pulmonary function test. Using an albuterol inhaler for allergy associated wheezing. Obstructive lung disease of unknown etiology. Started on a nicotine patch. Sent home with prednisone taper and doxycycline. Blood sugar at 190 during hospitalization. She had a A1C four months ago that was normal. Normal vitamin D. Last lipid panel in December 4 months ago and improved from prior recommended  to start low dose pravastatin although she intolerance to high dose Zocor, and recommended to starting fish oil.  Still taking her prescription vitamin D once a week. Pt is not taking her pravastatin due to bad muscle cramps. Not taking the Paxil, she was on it for two years. Pt takes xanax as needed. Checks blood pressure at home numbers usually run around 125/70.  Obstructive lung flare up started early this week she has shortness of breath cough, wheezing, sinus pressure, bilateral ear pain, and congestion. Taking Wal-itin for allergies, last used on Wednesday. Albuterol does not help much, she has uses it 3 times a day. Pt smokes 5 cigarettes a day, she has been cutting back. Not using her nicotine patches. Leg swelling which began this past month, this has worsened since her hospital visit. The skin is very tight  but not painful.   No history of heart disease, she is not currently seeing any specialist.  Pt is going to [place] on Friday for two weeks.  Past Medical History  Diagnosis Date  . Allergy   . Asthma   . Hypertension   . Hyperlipidemia   . Depression   . Anxiety    Current Outpatient Prescriptions on File Prior to Visit  Medication Sig Dispense Refill  . albuterol (PROAIR HFA) 108 (90 BASE) MCG/ACT inhaler 2 puffs by mouth 4 times a day as needed for symptoms. 3 Inhaler 3  . ALPRAZolam (XANAX) 0.5 MG tablet Take 1 tablet in morning and 1 tablet at bedtime for sleep. 60 tablet 2  . amLODipine-benazepril (LOTREL) 10-40 MG per capsule TAKE 1 CAPSULE DAILY 90 capsule 3  . gabapentin (NEURONTIN) 400 MG capsule Take 1 capsule at bedtime or as directed. 90 capsule 3  . pravastatin (PRAVACHOL) 20 MG tablet Take 20 mg by mouth daily.   5  . Vitamin D, Ergocalciferol, (DRISDOL) 50000 UNITS CAPS capsule TAKE 1 CAPSULE ONCE A WEEK 12 capsule 1  . doxycycline (VIBRAMYCIN) 50 MG capsule Take 1 capsule (50 mg total) by mouth 2 (two) times daily. (Patient not taking: Reported on 06/05/2014) 4 capsule 0  . nicotine (NICODERM CQ - DOSED IN MG/24 HR) 7 mg/24hr patch Place 1 patch (7 mg total) onto the skin daily. (Patient not taking: Reported on 06/05/2014) 28 patch 0  . PARoxetine (PAXIL) 20 MG tablet  Take 1 tablet (20 mg total) by mouth every morning. (Patient not taking: Reported on 06/05/2014) 90 tablet 3  . predniSONE (DELTASONE) 10 MG tablet 40 x 2 days, 30 mg x 2 days, 20 mg x 2 days, 10 mg x 2 days then d/c (Patient not taking: Reported on 06/05/2014) 20 tablet 0   No current facility-administered medications on file prior to visit.   Allergies  Allergen Reactions  . Terbinafine Hcl Swelling  . Avelox [Moxifloxacin Hcl In Nacl]   . Montelukast Sodium Other (See Comments)    Insomnia  . Zocor [Simvastatin - High Dose]    Past Surgical History  Procedure Laterality Date  . Slipped capital  femoral epiphysis pinning     Family History  Problem Relation Age of Onset  . Hypertension Mother   . Hyperlipidemia Mother   . Hypertension Brother   . COPD Brother    History   Social History  . Marital Status: Widowed    Spouse Name: N/A  . Number of Children: N/A  . 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 Smoking status: Current Every Day Smoker -- 0.50 packs/day for 35 years    Types: Cigarettes  . Smokeless tobacco: Not on file  . Alcohol Use: No  . Drug Use: No  . Sexual Activity: Not on file   Other Topics Concern  . None   Social History Narrative   Does not exercise.    Review of Systems  Constitutional: Positive for diaphoresis, activity change, appetite change and fatigue. Negative for fever, chills and unexpected weight change.  HENT: Positive for congestion, ear pain, rhinorrhea and sinus pressure. Negative for ear discharge, facial swelling, hearing loss, sore throat, trouble swallowing and voice change.   Eyes: Negative for visual disturbance.  Respiratory: Positive for cough, shortness of breath and wheezing. Negative for chest tightness.   Cardiovascular: Positive for leg swelling. Negative for chest pain and palpitations.  Gastrointestinal: Negative for vomiting.  Genitourinary: Negative for decreased urine volume.  Musculoskeletal: Positive for myalgias, joint swelling and arthralgias. Negative for gait problem.  Skin: Positive for color change. Negative for rash.  Allergic/Immunologic: Positive for environmental allergies. Negative for immunocompromised state.  Neurological: Negative for weakness and numbness.  Psychiatric/Behavioral: Positive for sleep disturbance.      Objective:  BP 146/70 mmHg  Pulse 73  Temp(Src) 98.3 F (36.8 C) (Oral)  Resp 16  Ht 5' 9.5" (1.765 m)  Wt 330 lb 3.2 oz (149.778 kg)  BMI 48.08 kg/m2  SpO2 95% Physical Exam  Constitutional: She is  oriented to person, place, and time. She appears well-developed and well-nourished. No distress.  HENT:  Head: Normocephalic and atraumatic.  Right Ear: Tympanic membrane is erythematous. A middle ear effusion is present.  Left Ear: Tympanic membrane is erythematous. A middle ear effusion is present.  Nose: Mucosal edema present.  Mouth/Throat: Posterior oropharyngeal edema and posterior oropharyngeal erythema present.  Dry mucous membranes.  Eyes: Pupils are equal, round, and reactive to light.  Neck: Normal range of motion.  Cardiovascular: Normal rate and regular rhythm.   Pulmonary/Chest: Effort normal. No respiratory distress.  Decreased breath sound through Bilateral upper lobes with expiratory wheezes. Bilateral lower lobes with inspiratory rales, rhonchi and expiratory wheeze  Musculoskeletal: Normal range of motion.  3+ pitting edema with warmth   Neurological: She is alert and oriented to person, place, and time.  Skin: Skin is  warm and dry.  Psychiatric: She has a normal mood and affect. Her behavior is normal.  Nursing note and vitals reviewed.    [facility] reading (PRIMARY) by  Dr. Clelia Croft. CXR:  Increased bibasilar infiltrates - slightly improved from prior - concern for nodule off of right hilum Dg Chest 2 View  06/05/2014   CLINICAL DATA:  Wheezing and shortness of breath, history of asthma and COPD, currently smoking  EXAM: CHEST  2 VIEW  COMPARISON:  PA and lateral chest x-ray of April 25, 2014  FINDINGS: The lungs are mildly hyperinflated with hemidiaphragm flattening. There is no focal infiltrate. The interstitial markings are mildly prominent but stable. The heart and pulmonary vascularity are normal. The mediastinum is normal in width. The trachea is midline. The bony thorax exhibits no acute abnormality.  IMPRESSION: COPD. There is no pneumonia, CHF, nor other acute cardiopulmonary abnormality.   Electronically Signed   By: David  [place] M.D.   On: 06/05/2014 16:40      Assessment & Plan:   1. Acute bronchitis, unspecified organism - pt hospitalized last mo for respiratory illness - found to have COPD on xray which was new info for pt - no prior resp illness and imaging normal prior.  Proair is expensive - wants to switch to other alb inh on ins formulary.  Pt has not been back to baseline yet to have PFTs done.  Recommend recheck in 1-2 months for PFTs pre and post neb. Can consider pulm referral.  Pt cutting down on tobacco - down to 5 cigs/d. Pt leaving for 2 wk vacation in [place] in 1 wk - has a 26 hr flight - so will be more aggressive about treating illness in hopes of pt feeing better in time to leave so will cover w/ zpack and augmentin as could have a healthcare-acquired lower resp infection. Start prednisone 50 qd x 5d.  Recheck in 3-4d.  2. Allergic rhinitis, unspecified allergic rhinitis type   3. COPD exacerbation   4. Edema - start lasix w/ K, increase fluid, decrease salt. Elevate legs when sitting, ambulate freq.  5. Hyperlipidemia   6. Vitamin D deficiency   7. Depression with anxiety - after husbands passing 2 yrs ago - now dating and feeling better so wants to stop paxil - wants to consider prn xanax for now which is fine as long as pt is not using it daily.  8. Polypharmacy   9.      Tobacco use - pt cutting down on her own - no desire for help as of now  Orders Placed This Encounter  Procedures  . DG Chest 2 View    Standing Status: Future     Number of Occurrences: 1     Standing Expiration Date: 06/12/2014    Order Specific Question:  Reason for Exam (SYMPTOM  OR DIAGNOSIS REQUIRED)    Answer:  new wheezing or rales    Order Specific Question:  Is the patient pregnant?    Answer:  No    Order Specific Question:  Preferred imaging location?    Answer:  External  . Comprehensive metabolic panel  . Vit D  25 hydroxy (rtn osteoporosis monitoring)  . Pro b natriuretic peptide  . POCT CBC  . POCT glucose (manual entry)  . POCT  SEDIMENTATION RATE    Meds ordered this encounter  Medications  . DISCONTD: albuterol (PROVENTIL HFA;VENTOLIN HFA) 108 (90 BASE) MCG/ACT inhaler    Sig: Inhale 1-2 puffs into the  lungs every 4 (four) hours as needed for wheezing or shortness of breath (cough, shortness of breath or wheezing.).    Dispense:  1 Inhaler    Refill:  11  . albuterol (PROVENTIL) (2.5 MG/3ML) 0.083% nebulizer solution 2.5 mg    Sig:   . ipratropium (ATROVENT) nebulizer solution 0.5 mg    Sig:   . ALPRAZolam (XANAX) 0.5 MG tablet    Sig: Take 1 tablet in morning and 1 tablet at bedtime for sleep.    Dispense:  60 tablet    Refill:  2    60 tablets must last 30 days; no early refills.  . furosemide (LASIX) 40 MG tablet    Sig: Take 1 tablet (40 mg total) by mouth daily.    Dispense:  30 tablet    Refill:  0  . potassium chloride SA (K-DUR,KLOR-CON) 20 MEQ tablet    Sig: Take 1 tablet (20 mEq total) by mouth daily.    Dispense:  30 tablet    Refill:  0  . predniSONE (DELTASONE) 50 MG tablet    Sig: Take 1 tablet (50 mg total) by mouth daily with breakfast.    Dispense:  5 tablet    Refill:  0  . azithromycin (ZITHROMAX) 500 MG tablet    Sig: Take 1 tablet (500 mg total) by mouth daily.    Dispense:  3 tablet    Refill:  0  . amoxicillin-clavulanate (AUGMENTIN) 875-125 MG per tablet    Sig: Take 1 tablet by mouth 2 (two) times daily.    Dispense:  20 tablet    Refill:  0  . albuterol (PROVENTIL HFA;VENTOLIN HFA) 108 (90 BASE) MCG/ACT inhaler    Sig: Inhale 1-2 puffs into the lungs every 4 (four) hours as needed for wheezing or shortness of breath.    Dispense:  1 Inhaler    Refill:  11   Over 40 min spent in face-to-face evaluation of and consultation with patient and coordination of care.  Over 50% of this time was spent counseling this patient.   I personally performed the services described in this documentation, which was scribed in my presence. The recorded information has been reviewed and  considered, and addended by me as needed.  Rita SorensonEva Shaw, MD MPH

## 2014-06-05 NOTE — Patient Instructions (Signed)
Peripheral Edema You have swelling in your legs (peripheral edema). This swelling is due to excess accumulation of salt and water in your body. Edema may be a sign of heart, kidney or liver disease, or a side effect of a medication. It may also be due to problems in the leg veins. Elevating your legs and using special support stockings may be very helpful, if the cause of the swelling is due to poor venous circulation. Avoid long periods of standing, whatever the cause. Treatment of edema depends on identifying the cause. Chips, pretzels, pickles and other salty foods should be avoided. Restricting salt in your diet is almost always needed. Water pills (diuretics) are often used to remove the excess salt and water from your body via urine. These medicines prevent the kidney from reabsorbing sodium. This increases urine flow. Diuretic treatment may also result in lowering of potassium levels in your body. Potassium supplements may be needed if you have to use diuretics daily. Daily weights can help you keep track of your progress in clearing your edema. You should call your caregiver for follow up care as recommended. SEEK IMMEDIATE MEDICAL CARE IF:   You have increased swelling, pain, redness, or heat in your legs.  You develop shortness of breath, especially when lying down.  You develop chest or abdominal pain, weakness, or fainting.  You have a fever. Document Released: 03/02/2004 Document Revised: 04/17/2011 Document Reviewed: 02/10/2009 [facility] Patient Information 2015 [employer], [place]. This information is not intended to replace advice given to you by your health care provider. Make sure you discuss any questions you have with your health care provider.  You need to wear compression hose/socks.  Whenever you are sitting elevate your feet but walk and exercise as much as you can.  Increase water and potassium but stop all salt from your diet.

## 2014-06-06 LAB — COMPREHENSIVE METABOLIC PANEL
ALBUMIN: 3.7 g/dL (ref 3.5–5.2)
ALT: 10 U/L (ref 0–35)
AST: 15 U/L (ref 0–37)
Alkaline Phosphatase: 108 U/L (ref 39–117)
BUN: 10 mg/dL (ref 6–23)
CALCIUM: 8.7 mg/dL (ref 8.4–10.5)
CHLORIDE: 104 meq/L (ref 96–112)
CO2: 29 meq/L (ref 19–32)
Creat: 0.67 mg/dL (ref 0.50–1.10)
GLUCOSE: 91 mg/dL (ref 70–99)
POTASSIUM: 3.9 meq/L (ref 3.5–5.3)
SODIUM: 140 meq/L (ref 135–145)
TOTAL PROTEIN: 7.1 g/dL (ref 6.0–8.3)
Total Bilirubin: 0.4 mg/dL (ref 0.2–1.2)

## 2014-06-08 LAB — PRO B NATRIURETIC PEPTIDE: PRO B NATRI PEPTIDE: 573.5 pg/mL — AB (ref <126)

## 2014-06-08 LAB — VITAMIN D 25 HYDROXY (VIT D DEFICIENCY, FRACTURES): Vit D, 25-Hydroxy: 13 ng/mL — ABNORMAL LOW (ref 30–100)

## 2014-06-27 ENCOUNTER — Other Ambulatory Visit: Payer: Self-pay

## 2014-06-27 ENCOUNTER — Encounter (HOSPITAL_BASED_OUTPATIENT_CLINIC_OR_DEPARTMENT_OTHER): Payer: Self-pay

## 2014-06-27 ENCOUNTER — Emergency Department (HOSPITAL_BASED_OUTPATIENT_CLINIC_OR_DEPARTMENT_OTHER): Payer: BLUE CROSS/BLUE SHIELD

## 2014-06-27 ENCOUNTER — Emergency Department (HOSPITAL_BASED_OUTPATIENT_CLINIC_OR_DEPARTMENT_OTHER)
Admission: EM | Admit: 2014-06-27 | Discharge: 2014-06-27 | Disposition: A | Discharge status: Home / Self Care | Payer: BLUE CROSS/BLUE SHIELD | Attending: Emergency Medicine | Admitting: Emergency Medicine

## 2014-06-27 DIAGNOSIS — J441 Chronic obstructive pulmonary disease with (acute) exacerbation: Secondary | ICD-10-CM | POA: Diagnosis not present

## 2014-06-27 DIAGNOSIS — Z7952 Long term (current) use of systemic steroids: Secondary | ICD-10-CM | POA: Insufficient documentation

## 2014-06-27 DIAGNOSIS — E785 Hyperlipidemia, unspecified: Secondary | ICD-10-CM | POA: Diagnosis not present

## 2014-06-27 DIAGNOSIS — Z87891 Personal history of nicotine dependence: Secondary | ICD-10-CM | POA: Insufficient documentation

## 2014-06-27 DIAGNOSIS — F329 Major depressive disorder, single episode, unspecified: Secondary | ICD-10-CM | POA: Diagnosis not present

## 2014-06-27 DIAGNOSIS — F419 Anxiety disorder, unspecified: Secondary | ICD-10-CM | POA: Diagnosis not present

## 2014-06-27 DIAGNOSIS — Z79899 Other long term (current) drug therapy: Secondary | ICD-10-CM | POA: Diagnosis not present

## 2014-06-27 DIAGNOSIS — I10 Essential (primary) hypertension: Secondary | ICD-10-CM | POA: Insufficient documentation

## 2014-06-27 DIAGNOSIS — Z792 Long term (current) use of antibiotics: Secondary | ICD-10-CM | POA: Diagnosis not present

## 2014-06-27 DIAGNOSIS — R0602 Shortness of breath: Secondary | ICD-10-CM | POA: Diagnosis present

## 2014-06-27 DIAGNOSIS — R600 Localized edema: Secondary | ICD-10-CM | POA: Insufficient documentation

## 2014-06-27 HISTORY — DX: Chronic obstructive pulmonary disease, unspecified: J44.9

## 2014-06-27 LAB — BASIC METABOLIC PANEL
Anion gap: 8 (ref 5–15)
BUN: 10 mg/dL (ref 6–20)
CHLORIDE: 106 mmol/L (ref 101–111)
CO2: 26 mmol/L (ref 22–32)
Calcium: 8.7 mg/dL — ABNORMAL LOW (ref 8.9–10.3)
Creatinine, Ser: 0.61 mg/dL (ref 0.44–1.00)
GFR calc non Af Amer: >60 mL/min (ref >60)
Glucose, Bld: 108 mg/dL — ABNORMAL HIGH (ref 65–99)
POTASSIUM: 3.3 mmol/L — AB (ref 3.5–5.1)
SODIUM: 140 mmol/L (ref 135–145)

## 2014-06-27 LAB — CBC WITH DIFFERENTIAL/PLATELET
Basophils Absolute: 0.1 10*3/uL (ref 0.0–0.1)
Basophils Relative: 1 % (ref 0–1)
EOS PCT: 12 % — AB (ref 0–5)
Eosinophils Absolute: 1.2 10*3/uL — ABNORMAL HIGH (ref 0.0–0.7)
HEMATOCRIT: 41.5 % (ref 36.0–46.0)
Hemoglobin: 13.2 g/dL (ref 12.0–15.0)
Lymphocytes Relative: 28 % (ref 12–46)
Lymphs Abs: 2.7 10*3/uL (ref 0.7–4.0)
MCH: 28.4 pg (ref 26.0–34.0)
MCHC: 31.8 g/dL (ref 30.0–36.0)
MCV: 89.4 fL (ref 78.0–100.0)
Monocytes Absolute: 0.9 10*3/uL (ref 0.1–1.0)
Monocytes Relative: 9 % (ref 3–12)
NEUTROS PCT: 50 % (ref 43–77)
Neutro Abs: 4.8 10*3/uL (ref 1.7–7.7)
PLATELETS: 304 10*3/uL (ref 150–400)
RBC: 4.64 MIL/uL (ref 3.87–5.11)
RDW: 15.6 % — AB (ref 11.5–15.5)
WBC: 9.6 10*3/uL (ref 4.0–10.5)

## 2014-06-27 LAB — BRAIN NATRIURETIC PEPTIDE: B Natriuretic Peptide: 103.5 pg/mL — ABNORMAL HIGH (ref 0.0–100.0)

## 2014-06-27 LAB — TROPONIN I: Troponin I: <0.03 ng/mL (ref <0.031)

## 2014-06-27 MED ORDER — IPRATROPIUM-ALBUTEROL 0.5-2.5 (3) MG/3ML IN SOLN
RESPIRATORY_TRACT | Status: AC
  Administered 2014-06-27: 3 mL
  Filled 2014-06-27: qty 3

## 2014-06-27 MED ORDER — ALBUTEROL SULFATE (2.5 MG/3ML) 0.083% IN NEBU
INHALATION_SOLUTION | RESPIRATORY_TRACT | Status: AC
  Administered 2014-06-27: 2.5 mg
  Filled 2014-06-27: qty 3

## 2014-06-27 MED ORDER — IPRATROPIUM BROMIDE HFA 17 MCG/ACT IN AERS: 2.0000 | INHALATION_SPRAY | Freq: Four times a day (QID) | RESPIRATORY_TRACT | Status: DC | PRN

## 2014-06-27 MED ORDER — METHYLPREDNISOLONE SODIUM SUCC 125 MG IJ SOLR
125.0000 mg | Freq: Once | INTRAMUSCULAR | Status: AC
Start: 2014-06-27 — End: 2014-06-27
  Administered 2014-06-27: 125 mg via INTRAVENOUS
  Filled 2014-06-27: qty 2

## 2014-06-27 MED ORDER — PREDNISONE 20 MG PO TABS: ORAL_TABLET | ORAL | Status: DC

## 2014-06-27 MED ORDER — ALBUTEROL SULFATE (2.5 MG/3ML) 0.083% IN NEBU
5.0000 mg | INHALATION_SOLUTION | Freq: Once | RESPIRATORY_TRACT | Status: AC
  Administered 2014-06-27: 5 mg via RESPIRATORY_TRACT
  Filled 2014-06-27: qty 6

## 2014-06-27 NOTE — ED Notes (Signed)
Pt teaching done re: reinforcing use of MDI [facility]

## 2014-06-27 NOTE — ED Provider Notes (Signed)
CSN: [id number]     Arrival date & time 06/27/14  1344 History  This chart was scribed for Arby BarretteMarcy Kemara Quigley, MD by Doreatha MartinEva Mathews, ED Scribe. This patient was seen in room MH02/MH02 and the patient's care was started at 3:06 PM.     Chief Complaint  Patient presents with  . Shortness of Breath   The history is provided by the patient. No language interpreter was used.    HPI Comments: Rita Watkins is a 59 y.o. female with a PMHx of asthma, hypertension, hyperlipidemia, and COPD, who presents to the Emergency Department complaining of moderate SOB with associated wheezing that initially began in March. Patient was hospitalized in March and was also seen in late April with similar symptoms and has been treated with 2 courses of steroid medications thus far. She describes to "feel my heart beating in my lips" when she uses the ventolin inhaler which she feels worsens her symptoms, she states when she had ProAir she didn't get that symptom.  She denies prior hx of bronchitis or COPD until March. Patient reports she noticed increased wheezing since using inhaler (ventolin vs proAir). Patient is a former smoker and smoked for 25 years with 1 ppd. She states she quit 2 months ago. She reports associated sinus drainage and bilateral leg swelling for the past 3 months, but denies associated pain. No fevers, no chest pain. She denies associated abdominal pain, nausea or vomiting.  Past Medical History  Diagnosis Date  . Allergy   . Asthma   . Hypertension   . Hyperlipidemia   . Depression   . Anxiety   . COPD (chronic obstructive pulmonary disease)    Past Surgical History  Procedure Laterality Date  . Slipped capital femoral epiphysis pinning     Family History  Problem Relation Age of Onset  . Hypertension Mother   . Hyperlipidemia Mother   . Hypertension Brother   . COPD Brother    History  Substance Use Topics  . Smoking status: Former Smoker -- 0.00 packs/day for 35 years  . Smokeless  tobacco: Not on file  . Alcohol Use: No   OB History    No data available     Review of Systems  Constitutional: Negative for fever.  Respiratory: Positive for cough, shortness of breath and wheezing.   Cardiovascular: Positive for leg swelling. Negative for chest pain.   Allergies  Terbinafine hcl; Avelox; Montelukast sodium; Pravastatin; and Zocor  Home Medications   Prior to Admission medications   Medication Sig Start Date End Date Taking? Authorizing Provider  albuterol (PROVENTIL HFA;VENTOLIN HFA) 108 (90 BASE) MCG/ACT inhaler Inhale 1-2 puffs into the lungs every 4 (four) hours as needed for wheezing or shortness of breath. 06/05/14   Sherren MochaEva N Shaw, MD  ALPRAZolam Prudy Feeler(XANAX) 0.5 MG tablet Take 1 tablet in morning and 1 tablet at bedtime for sleep. 06/05/14   Sherren MochaEva N Shaw, MD  amLODipine-benazepril (LOTREL) 10-40 MG per capsule TAKE 1 CAPSULE DAILY 09/12/13   Maurice MarchBarbara B McPherson, MD  amoxicillin-clavulanate (AUGMENTIN) 875-125 MG per tablet Take 1 tablet by mouth 2 (two) times daily. 06/05/14   Sherren MochaEva N Shaw, MD  azithromycin (ZITHROMAX) 500 MG tablet Take 1 tablet (500 mg total) by mouth daily. 06/05/14   Sherren MochaEva N Shaw, MD  furosemide (LASIX) 40 MG tablet Take 1 tablet (40 mg total) by mouth daily. 06/05/14   Sherren MochaEva N Shaw, MD  gabapentin (NEURONTIN) 400 MG capsule Take 1 capsule at bedtime or as directed.  01/16/14   Raelyn Ensign, PA  ipratropium (ATROVENT HFA) 17 MCG/ACT inhaler Inhale 2 puffs into the lungs every 6 (six) hours as needed for wheezing. 06/27/14   Arby Barrette, MD  potassium chloride SA (K-DUR,KLOR-CON) 20 MEQ tablet Take 1 tablet (20 mEq total) by mouth daily. 06/05/14   Sherren Mocha, MD  predniSONE (DELTASONE) 20 MG tablet 3 tabs po daily x 3 days, then 2 tabs x 3 days, then 1.5 tabs x 3 days, then 1 tab x 3 days, then 0.5 tabs x 3 days 06/27/14   Arby Barrette, MD  predniSONE (DELTASONE) 50 MG tablet Take 1 tablet (50 mg total) by mouth daily with breakfast. 06/05/14   Sherren Mocha, MD   Vitamin D, Ergocalciferol, (DRISDOL) 50000 UNITS CAPS capsule TAKE 1 CAPSULE ONCE A WEEK 01/16/14   Raelyn Ensign, PA   Triage Vitals: BP 140/104 mmHg  Pulse 78  Temp(Src) 98.1 F (36.7 C) (Oral)  Resp 23  SpO2 90%  Physical Exam  Constitutional: She is oriented to person, place, and time.  The patient is morbidly obese. She is alert and nontoxic. She is being seen after 2 nebulizer treatments. She has mild to moderate increased work of breathing but is speaking in full sentences. Her color is good. She is sitting upright in no active distress.  HENT:  Head: Normocephalic and atraumatic.  Makes memories pink and moist, posterior or first widely patent.  Eyes: EOM are normal. Pupils are equal, round, and reactive to light.  Neck: Neck supple.  Cardiovascular: Normal rate, regular rhythm, normal heart sounds and intact distal pulses.   Pulmonary/Chest:  Mild increased work of breathing. Speaking in full clear sentences. Bilateral coarse expiratory wheeze with prolonged expiratory phase. No gross rhonchi or rail.  Abdominal: Soft. Bowel sounds are normal. She exhibits no distension. There is no tenderness.  Musculoskeletal: Normal range of motion. She exhibits edema. She exhibits no tenderness.  Patient has symmetric 3+ pitting edema bilateral lower extremity. Skin condition is good. Without ulcerations  Neurological: She is alert and oriented to person, place, and time. She has normal strength. Coordination normal. GCS eye subscore is 4. GCS verbal subscore is 5. GCS motor subscore is 6.  Skin: Skin is warm, dry and intact.  Psychiatric: She has a normal mood and affect.    ED Course  Procedures   DIAGNOSTIC STUDIES: Oxygen Saturation is 90% on RA, low by my interpretation.    COORDINATION OF CARE: 3:13 PM Discussed treatment plan with pt at bedside which includes diagnostic CXR, EKG and lab work. Will give patient Solumedrol, and albuterol breathing treatment. Pt agreed to plan.    Labs Review Labs Reviewed  BASIC METABOLIC PANEL - Abnormal; Notable for the following:    Potassium 3.3 (*)    Glucose, Bld 108 (*)    Calcium 8.7 (*)    All other components within normal limits  CBC WITH DIFFERENTIAL/PLATELET - Abnormal; Notable for the following:    RDW 15.6 (*)    Eosinophils Relative 12 (*)    Eosinophils Absolute 1.2 (*)    All other components within normal limits  BRAIN NATRIURETIC PEPTIDE - Abnormal; Notable for the following:    B Natriuretic Peptide 103.5 (*)    All other components within normal limits  TROPONIN I   Imaging Review Dg Chest 2 View  06/27/2014   CLINICAL DATA:  Shortness of breath and bilateral lower extremity edema over the past 2 months.  EXAM: CHEST  2  VIEW  COMPARISON:  06/05/2014, 04/25/2014, 03/13/2011.  FINDINGS: Cardiac silhouette normal in size, unchanged. Thoracic aorta atherosclerotic, unchanged. Hilar and mediastinal contours otherwise unremarkable. Stable calcified granuloma or hamartoma in the right lower lobe. Prominent bronchovascular markings diffusely and moderate to marked central peribronchial thickening, more so than on the prior examination. No confluent airspace consolidation. No pleural effusions. No pneumothorax. Emphysematous changes again suspected in the upper lobes. Degenerative changes involving the thoracic spine.  IMPRESSION: Moderate-to-marked changes of acute bronchitis and/or asthma superimposed upon COPD without localized airspace pneumonia.   Electronically Signed   By: Hulan Saas M.D.   On: 06/27/2014 14:11    EKG Interpretation   Date/Time:  Saturday Jun 27 2014 14:06:48 EDT Ventricular Rate:  69 PR Interval:  164 QRS Duration: 102 QT Interval:  428 QTC Calculation: 458 R Axis:   54 Text Interpretation:  Normal sinus rhythm Normal ECG since last tracing no  significant change Confirmed by BELFI  MD, MELANIE ([id number]) on 06/27/2014  3:19:07 PM     Recheck: Patient reports she feels much  better after 2 nebulizer treatments. Her respirations appear calm and nonlabored. MDM   Final diagnoses:  COPD exacerbation   The patient describes symptoms consistent with COPD exacerbation. This has been recurrent for the past several months. She presents with coarse wheezing that responded very well to DuoNeb therapy. She has not had any fever present. The patient has only recently finished antibiotics of both Zithromax and Augmentin within the past week and a half. At this time I do not feel that empiric antibiotics are indicated. She does not have any associated chest pain or pleurisy symptoms. The patient has had chronic, symmetric bilateral lower extremity edema with no associated calf pain. At this time by physical examination and BNP, the patient does not show signs of acute congestive heart failure. It appears more likely this is venous stasis. At this time I do feel the patient is safe for discharge with steroid therapy which she reports makes a huge difference in her wheezing and shortness of breath. I have provided the pulmonologist contact information for the patient to schedule formal pulmonology evaluation for COPD. She states she was supposed to gotten pulmonary function tests done but has not been scheduled yet. She is advised that she will need to return immediately should she develop recurrence or worsening of shortness of breath or other associated symptoms.  Arby Barrette, MD 06/27/14 [phone]

## 2014-06-27 NOTE — Discharge Instructions (Signed)
Chronic Asthmatic Bronchitis  Use your pro-air inhaler 2 puffs every 4-6 hours and the Atrovent inhaler 2 puffs every 4-6 hours. Take steroids as prescribed. Make an appointment with pulmonology in your family doctor as soon as possible. Return to the emergency department if your symptoms are worsening or new concerning symptoms or developing.  Chronic asthmatic bronchitis is a complication of persistent asthma. After a period of time with asthma, some people develop airflow obstruction that is present all the time, even when not having an asthma attack.There is also persistent inflammation of the airways, and the bronchial tubes produce more mucus. Chronic asthmatic bronchitis usually is a permanent problem with the lungs. CAUSES  Chronic asthmatic bronchitis happens most often in people who have asthma and also smoke cigarettes. Occasionally, it can happen to a person with long-standing or severe asthma even if the person is not a smoker. SIGNS AND SYMPTOMS  Chronic asthmatic bronchitis usually causes symptoms of both asthma and chronic bronchitis, including:   Coughing.  Increased sputum production.  Wheezing and shortness of breath.  Chest discomfort.  Recurring infections. DIAGNOSIS  Your health care provider will take a medical history and perform a physical exam. Chronic asthmatic bronchitis is suspected when a person with asthma has abnormal results on breathing tests (pulmonary function tests) even when breathing symptoms are at their best. Other tests, such as a chest X-ray, may be performed to rule out other conditions.  TREATMENT  Treatment involves controlling symptoms with medicine and lifestyle changes.  Your health care provider may prescribe asthma medicines, including inhaler and nebulizer medicines.  Infection can be treated with medicine to kill germs (antibiotics). Serious infections may require hospitalization. These can include:  Pneumonia.  Sinus  infections.  Acute bronchitis.   Preventing infection and hospitalization is very important. Get an influenza vaccination every year as directed by your health care provider. Ask your health care provider whether you need a pneumonia vaccine.  Ask your health care provider whether you would benefit from a pulmonary rehabilitation program. HOME CARE INSTRUCTIONS  Take medicines only as directed by your health care provider.  If you are a cigarette smoker, the most important thing that you can do is quit. Talk to your health care provider for help with quitting smoking.  Avoid pollen, dust, animal dander, molds, smoke, and other things that cause attacks.  Regular exercise is very important to help you feel better. Discuss possible exercise routines with your health care provider.  If animal dander is the cause of asthma, you may not be able to keep pets.  It is important that you:  Become educated about your medical condition.  Participate in maintaining wellness.  Seek medical care as directed. Delay in seeking medical care could cause permanent injury and may be a risk to your life. SEEK MEDICAL CARE IF:  You have wheezing and shortness of breath even if taking medicine to prevent attacks.  You have muscle aches, chest pain, or thickening of sputum.  Your sputum changes from clear or white to yellow, green, gray, or bloody. SEEK IMMEDIATE MEDICAL CARE IF:  Your usual medicines do not stop your wheezing.  You have increased coughing or shortness of breath or both.  You have increased difficulty breathing.  You have any problems from the medicine you are taking, such as a rash, itching, swelling, or trouble breathing. MAKE SURE YOU:   Understand these instructions.  Will watch your condition.  Will get help right away if you are not  doing well or get worse. Document Released: 11/10/2005 Document Revised: 06/09/2013 Document Reviewed: 03/03/2013 [facility] Patient  Information 2015 [employer], [place]. This information is not intended to replace advice given to you by your health care provider. Make sure you discuss any questions you have with your health care provider.

## 2014-06-27 NOTE — ED Notes (Signed)
Pt reports recently hospitalized for same.  Having sob, speaking in short sentences during triage, also with bilateral leg swelling, placed on lasix.  Cough, sounds productive but pt reports unable to get phlegm.  Denies fever or pain.

## 2014-07-15 ENCOUNTER — Encounter: Payer: Self-pay | Admitting: Internal Medicine

## 2014-07-15 ENCOUNTER — Ambulatory Visit (INDEPENDENT_AMBULATORY_CARE_PROVIDER_SITE_OTHER): Payer: BLUE CROSS/BLUE SHIELD | Admitting: Internal Medicine

## 2014-07-15 VITALS — BP 126/84 | HR 82 | Ht 69.0 in | Wt 324.0 lb

## 2014-07-15 DIAGNOSIS — Z720 Tobacco use: Secondary | ICD-10-CM

## 2014-07-15 DIAGNOSIS — J449 Chronic obstructive pulmonary disease, unspecified: Secondary | ICD-10-CM | POA: Diagnosis not present

## 2014-07-15 DIAGNOSIS — I10 Essential (primary) hypertension: Secondary | ICD-10-CM

## 2014-07-15 DIAGNOSIS — F17210 Nicotine dependence, cigarettes, uncomplicated: Secondary | ICD-10-CM

## 2014-07-15 HISTORY — DX: Nicotine dependence, cigarettes, uncomplicated: F17.210

## 2014-07-15 MED ORDER — FUROSEMIDE 40 MG PO TABS: 40.0000 mg | ORAL_TABLET | Freq: Every day | ORAL | Status: DC

## 2014-07-15 MED ORDER — AMLODIPINE-OLMESARTAN 5-20 MG PO TABS: 1.0000 | ORAL_TABLET | Freq: Every day | ORAL | Status: DC

## 2014-07-15 MED ORDER — POTASSIUM CHLORIDE CRYS ER 20 MEQ PO TBCR: 20.0000 meq | EXTENDED_RELEASE_TABLET | Freq: Every day | ORAL | Status: DC

## 2014-07-15 NOTE — Assessment & Plan Note (Signed)
ACE inhibitors are problematic in  pts with airway complaints because  even experienced pulmonologists can't always distinguish ace effects from copd/asthma.  By themselves they don't actually cause a problem, much like oxygen can't by itself start a fire, but they certainly serve as a powerful catalyst or enhancer for any "fire"  or inflammatory process in the upper airway, be it caused by an ET  tube or more commonly reflux (especially in the obese or pts with known GERD or who are on biphoshonates).    In the era of ARB near equivalency until we have a better handle on the reversibility of the airway problem, it just makes sense to avoid ACEI  entirely in the short run and then decide later, having established a level of airway control using a reasonable limited regimen, whether to add back ace but even then being very careful to observe the pt for worsening airway control and number of meds used/ needed to control symptoms.    Try azor 5-20 one daily x 2 weeks then regroup

## 2014-07-15 NOTE — Progress Notes (Signed)
Subjective:    Patient ID: Rita Watkins, female    DOB: Sep 06, 1955,  MRN: [id number]  HPI  59 y.o. female  Active smoker with onset 2008 when moved from [place] sneezing/tendency to sinus infections spring >> fall with occ bronchitis but never inhalers then much worse since March 2016 with new dx of AB : admit:   Admit date: 04/25/2014 Discharge date: 04/27/2014  Discharge Diagnoses:  Principal Problem:  Acute bronchiolitis due to other infectious organisms  HTN (hypertension)  Depression with anxiety  Acute respiratory failure with hypoxia   Discharge Condition: improved  Diet recommendation: cardiac  Filed Weights   04/25/14 2139 04/26/14 0239  Weight: 129.729 kg (286 lb) 163.749 kg (361 lb)    History of present illness:  Rita Watkins is a 59 y.o. female with Past medical history of hypertension, depression, anxiety, dyslipidemia, neuropathy, history of obstructive lung disease ) of seasonal allergies. The patient is presenting with complaints of shortness of breath and cough. She mentions that in [place] she generally has shortness of breath with wheezing and her PCP gives her albuterol inhaler. Since last Thursday she has been having progressively worsening wheezing and has went [facility]. She was given Augmentin and albuterol inhaler ED at Despite taking that her symptoms are not improved and therefore she came to the ER. She denies any chest pain fever or chills. She complains of a dry cough. She denies any leg tenderness or recent travel. She has chronic leg swelling. She denies any abdominal pain. No diarrhea nausea vomiting or choking episode. No sick contact or travel outside of states. Patient is active smoker currently smoking 10 cigarettes a day  Hospital Course:  Acute bronchiolitis due to other infectious organisms -nebs, abx, steroids Patient will need outpatient workup with PCP and pulmonologist for diagnosing the etiology of her  obstructive lung disease  Tobacco abuse -encourage cessation -given nicotine patch  Hypertension. Continue home medication.  Depression. Continue home treatment.       07/15/2014 1st [facility] office visit/ Ariadna Setter     ? Copd? Asthma ? Ace case  Chief Complaint  Patient presents with  . Pulmonary Consult    Referred by Dr. Arby Barrette. Pt states recently dxed with COPD.  Pt c/o SOB and cough since March 2016.  Her cough is non prod and bothers her on and off. She states that her SOB comes and goes and can be with or without any exertion.  She is using albuterol inhaler at least 2 x per day and uses atrovent bib.  only 100% better since March 2016 while on prednisone last finished 07/09/14 but starting to note cough again  worsening despite multiple inhalers while on lotrel Note sob with / without exertion and sporadic dry cough day > noct on lotrel chronically  No obvious other patterns in day to day or daytime variabilty or assoc  cp or chest tightness, subjective wheeze overt sinus or hb symptoms. No unusual exp hx or h/o childhood pna/ asthma or knowledge of premature birth.  Sleeping ok without nocturnal  or early am exacerbation  of respiratory  c/o's or need for noct saba. Also denies any obvious fluctuation of symptoms with weather or environmental changes or other aggravating or alleviating factors except as outlined above   Current Medications, Allergies, Complete Past Medical History, Past Surgical History, Family History, and Social History were reviewed in [employer] record.  Review of Systems  Constitutional: Negative for fever, chills and unexpected weight change.  HENT: Positive for congestion and sneezing. Negative for dental problem, ear pain, nosebleeds, postnasal drip, rhinorrhea, sinus pressure, sore throat, trouble swallowing and voice change.   Eyes: Negative for visual disturbance.  Respiratory: Positive for cough  and shortness of breath. Negative for choking.   Cardiovascular: Positive for leg swelling. Negative for chest pain.  Gastrointestinal: Negative for vomiting, abdominal pain and diarrhea.  Genitourinary: Negative for difficulty urinating.  Musculoskeletal: Positive for arthralgias.  Skin: Negative for rash.  Neurological: Negative for tremors, syncope and headaches.  Hematological: Does not bruise/bleed easily.       Objective:   Physical Exam  amb wf nad pseudowheeze only   Wt Readings from Last 3 Encounters:  07/15/14 324 lb (146.965 kg)  06/05/14 330 lb 3.2 oz (149.778 kg)  04/26/14 361 lb (163.749 kg)    Vital signs reviewed   HEENT: nl dentition, turbinates, and orophanx. Nl external ear canals without cough reflex   NECK :  without JVD/Nodes/TM/ nl carotid upstrokes bilaterally   LUNGS: no acc muscle use, clear to A and P bilaterally without cough on insp or exp maneuvers   CV:  RRR  no s3 or murmur or increase in P2, 1+ pitting bilateral lower ext  edema   ABD:  soft and nontender with nl excursion in the supine position. No bruits or organomegaly, bowel sounds nl  MS:  warm without deformities, calf tenderness, cyanosis or clubbing  SKIN: warm and dry without lesions    NEURO:  alert, approp, no deficits       I personally reviewed images and agree with radiology impression as follows:  CXR:  06/27/14 Moderate-to-marked changes of acute bronchitis and/or asthma superimposed upon COPD without localized airspace pneumonia.         Assessment & Plan:

## 2014-07-15 NOTE — Assessment & Plan Note (Addendum)
Despite smoking hx not certain she actually has copd/ ab > return for pfts off lotrel and ok to taper off resp rx if improves   In meantime may also be able to improve symptoms and avoid back on pred with rx of gerd short term   Each maintenance medication was reviewed in detail including most importantly the difference between maintenance and as needed and under what circumstances the prns are to be used.  Please see instructions for details which were reviewed in writing and the patient given a copy.

## 2014-07-15 NOTE — Patient Instructions (Signed)
Stop lotrel  Start azor 5/20 one daily and you should be able to wean off your inhalers over the next few weeks   Try prilosec 20mg   Take 30-60 min before first meal of the day and Pepcid (famotidine) 20 mg one bedtime until cough is completely gone for at least a week without the need for cough suppression  GERD (REFLUX)  is an extremely common cause of respiratory symptoms just like yours , many times with no obvious heartburn at all.    It can be treated with medication, but also with lifestyle changes including avoidance of late meals, elevation of the head of your bed (ideally with 6 inch  bed blocks) excessive alcohol, smoking cessation, and avoid fatty foods, chocolate, peppermint, colas, red wine, and acidic juices such as orange juice.  NO MINT OR MENTHOL PRODUCTS SO NO COUGH DROPS  USE SUGARLESS CANDY INSTEAD (Jolley ranchers or Stover's or Life Savers) or even ice chips will also do - the key is to swallow to prevent all throat clearing. NO OIL BASED VITAMINS - use powdered substitutes.   .Please schedule a follow up office visit in 2 weeks, sooner if needed

## 2014-07-15 NOTE — Assessment & Plan Note (Signed)
>   3 min discussion I reviewed the Fletcher curve with the patient that basically indicates  if you quit smoking when your best day FEV1 is still well preserved (as is probably the case here)  it is highly unlikely you will progress to severe disease and informed the patient there was no medication on the market that has proven to alter the curve/ its downward trajectory  or the likelihood of progression of their disease.  Therefore stopping smoking and maintaining abstinence is the most important aspect of care, not choice of inhalers or for that matter, doctors.   

## 2014-07-16 ENCOUNTER — Telehealth: Payer: Self-pay | Admitting: Family Medicine

## 2014-07-16 ENCOUNTER — Emergency Department (HOSPITAL_BASED_OUTPATIENT_CLINIC_OR_DEPARTMENT_OTHER)
Admission: EM | Admit: 2014-07-16 | Discharge: 2014-07-16 | Discharge status: Against Medical Advice | Payer: BLUE CROSS/BLUE SHIELD | Attending: Emergency Medicine | Admitting: Emergency Medicine

## 2014-07-16 ENCOUNTER — Telehealth: Payer: Self-pay | Admitting: Internal Medicine

## 2014-07-16 ENCOUNTER — Emergency Department (HOSPITAL_BASED_OUTPATIENT_CLINIC_OR_DEPARTMENT_OTHER): Payer: BLUE CROSS/BLUE SHIELD

## 2014-07-16 ENCOUNTER — Encounter (HOSPITAL_BASED_OUTPATIENT_CLINIC_OR_DEPARTMENT_OTHER): Payer: Self-pay

## 2014-07-16 DIAGNOSIS — R0602 Shortness of breath: Secondary | ICD-10-CM | POA: Diagnosis present

## 2014-07-16 DIAGNOSIS — Z8639 Personal history of other endocrine, nutritional and metabolic disease: Secondary | ICD-10-CM | POA: Diagnosis not present

## 2014-07-16 DIAGNOSIS — J441 Chronic obstructive pulmonary disease with (acute) exacerbation: Secondary | ICD-10-CM

## 2014-07-16 DIAGNOSIS — F419 Anxiety disorder, unspecified: Secondary | ICD-10-CM | POA: Diagnosis not present

## 2014-07-16 DIAGNOSIS — J159 Unspecified bacterial pneumonia: Secondary | ICD-10-CM | POA: Insufficient documentation

## 2014-07-16 DIAGNOSIS — Z720 Tobacco use: Secondary | ICD-10-CM | POA: Insufficient documentation

## 2014-07-16 DIAGNOSIS — J189 Pneumonia, unspecified organism: Secondary | ICD-10-CM

## 2014-07-16 DIAGNOSIS — F329 Major depressive disorder, single episode, unspecified: Secondary | ICD-10-CM | POA: Diagnosis not present

## 2014-07-16 DIAGNOSIS — Z79899 Other long term (current) drug therapy: Secondary | ICD-10-CM | POA: Diagnosis not present

## 2014-07-16 DIAGNOSIS — R0902 Hypoxemia: Secondary | ICD-10-CM

## 2014-07-16 DIAGNOSIS — I10 Essential (primary) hypertension: Secondary | ICD-10-CM | POA: Diagnosis not present

## 2014-07-16 LAB — CBC WITH DIFFERENTIAL/PLATELET
BASOS ABS: 0.1 10*3/uL (ref 0.0–0.1)
Basophils Relative: 1 % (ref 0–1)
EOS PCT: 6 % — AB (ref 0–5)
Eosinophils Absolute: 0.6 10*3/uL (ref 0.0–0.7)
HCT: 44.5 % (ref 36.0–46.0)
Hemoglobin: 14 g/dL (ref 12.0–15.0)
LYMPHS ABS: 2.3 10*3/uL (ref 0.7–4.0)
Lymphocytes Relative: 21 % (ref 12–46)
MCH: 28.5 pg (ref 26.0–34.0)
MCHC: 31.5 g/dL (ref 30.0–36.0)
MCV: 90.4 fL (ref 78.0–100.0)
MONO ABS: 1.2 10*3/uL — AB (ref 0.1–1.0)
Monocytes Relative: 11 % (ref 3–12)
NEUTROS ABS: 6.9 10*3/uL (ref 1.7–7.7)
Neutrophils Relative %: 61 % (ref 43–77)
PLATELETS: 212 10*3/uL (ref 150–400)
RBC: 4.92 MIL/uL (ref 3.87–5.11)
RDW: 15.7 % — ABNORMAL HIGH (ref 11.5–15.5)
WBC: 11 10*3/uL — ABNORMAL HIGH (ref 4.0–10.5)

## 2014-07-16 LAB — BASIC METABOLIC PANEL
ANION GAP: 6 (ref 5–15)
BUN: 13 mg/dL (ref 6–20)
CALCIUM: 8.5 mg/dL — AB (ref 8.9–10.3)
CHLORIDE: 103 mmol/L (ref 101–111)
CO2: 28 mmol/L (ref 22–32)
CREATININE: 0.67 mg/dL (ref 0.44–1.00)
GFR calc Af Amer: >60 mL/min (ref >60)
Glucose, Bld: 113 mg/dL — ABNORMAL HIGH (ref 65–99)
POTASSIUM: 3.8 mmol/L (ref 3.5–5.1)
Sodium: 137 mmol/L (ref 135–145)

## 2014-07-16 LAB — I-STAT CG4 LACTIC ACID, ED: Lactic Acid, Venous: 1.06 mmol/L (ref 0.5–2.0)

## 2014-07-16 MED ORDER — PREDNISONE 50 MG PO TABS
60.0000 mg | ORAL_TABLET | Freq: Once | ORAL | Status: AC
  Administered 2014-07-16: 60 mg via ORAL
  Filled 2014-07-16 (×2): qty 1

## 2014-07-16 MED ORDER — ALBUTEROL SULFATE (2.5 MG/3ML) 0.083% IN NEBU
INHALATION_SOLUTION | RESPIRATORY_TRACT | Status: AC
Start: 2014-07-16 — End: 2014-07-16
  Administered 2014-07-16: 2.5 mg via RESPIRATORY_TRACT
  Filled 2014-07-16: qty 3

## 2014-07-16 MED ORDER — ALBUTEROL (5 MG/ML) CONTINUOUS INHALATION SOLN
INHALATION_SOLUTION | RESPIRATORY_TRACT | Status: AC
  Administered 2014-07-16: 15 mg via RESPIRATORY_TRACT
  Filled 2014-07-16: qty 20

## 2014-07-16 MED ORDER — ALBUTEROL (5 MG/ML) CONTINUOUS INHALATION SOLN
INHALATION_SOLUTION | RESPIRATORY_TRACT | Status: AC
  Filled 2014-07-16: qty 20

## 2014-07-16 MED ORDER — PREDNISONE 20 MG PO TABS: 60.0000 mg | ORAL_TABLET | Freq: Every day | ORAL | Status: DC

## 2014-07-16 MED ORDER — LEVOFLOXACIN 500 MG PO TABS: 500.0000 mg | ORAL_TABLET | Freq: Every day | ORAL | Status: DC

## 2014-07-16 MED ORDER — PIPERACILLIN-TAZOBACTAM 3.375 G IVPB
3.3750 g | Freq: Once | INTRAVENOUS | Status: AC
  Administered 2014-07-16: 3.375 g via INTRAVENOUS
  Filled 2014-07-16: qty 50

## 2014-07-16 MED ORDER — VANCOMYCIN HCL IN DEXTROSE 1-5 GM/200ML-% IV SOLN
1000.0000 mg | Freq: Once | INTRAVENOUS | Status: AC
  Administered 2014-07-16: 1000 mg via INTRAVENOUS
  Filled 2014-07-16: qty 200

## 2014-07-16 MED ORDER — IPRATROPIUM-ALBUTEROL 0.5-2.5 (3) MG/3ML IN SOLN
3.0000 mL | Freq: Once | RESPIRATORY_TRACT | Status: AC
  Administered 2014-07-16: 3 mL via RESPIRATORY_TRACT

## 2014-07-16 MED ORDER — IPRATROPIUM-ALBUTEROL 0.5-2.5 (3) MG/3ML IN SOLN
RESPIRATORY_TRACT | Status: AC
  Administered 2014-07-16: 3 mL via RESPIRATORY_TRACT
  Filled 2014-07-16: qty 3

## 2014-07-16 MED ORDER — ALBUTEROL SULFATE (2.5 MG/3ML) 0.083% IN NEBU
2.5000 mg | INHALATION_SOLUTION | Freq: Once | RESPIRATORY_TRACT | Status: AC
  Administered 2014-07-16: 2.5 mg via RESPIRATORY_TRACT

## 2014-07-16 MED ORDER — ALBUTEROL SULFATE HFA 108 (90 BASE) MCG/ACT IN AERS: 2.0000 | INHALATION_SPRAY | RESPIRATORY_TRACT | Status: DC | PRN

## 2014-07-16 NOTE — Telephone Encounter (Signed)
Spoke with MD in the ER- Rita Watkins has pneumonia and hypoxia- the ER would like to admit her but she is refusing to stay.  She has animals at home and does not have anyone to care for them which seems to be her main reason for refusing to stay.  They will arrange home O2 for [place] but they wanted Rita Watkins to be aware of this.  If Rita Watkins could give her a call and arrange to see her in the office soon that would be ideal

## 2014-07-16 NOTE — ED Notes (Signed)
Pt states feeling much better after 2nd HHN tx, pt remains on cont cardiac monitor and cont POX

## 2014-07-16 NOTE — ED Notes (Signed)
Pt continues on oxygen at 2lpm via  with POX readings of 91-92%. With ambulating or extended speech pt noted to have dyspnea and difficulty speaking. RA POX readings will indicate 84-88%.

## 2014-07-16 NOTE — Care Management (Signed)
ED CM received call from Dr. Elesa Massed [facility] ED. Patient evaluated in the ED for with cough, wheezing, shortness of breath  Patient has had several episodes of oxygen desaturation. O2 Sat dropped as low as 83% after treatments. Recommendation for an admission patient refused to stay and will sign out AMA. Patient was made aware that she does not qualify under insurance guidelines to receive home oxygen from the ED. Patient made aware she would be required to pay out of pocket, patient is agreeable. Home health service also recommended for [facility], Respiratory care. Patient is agreeable. Offered choice, [facility] was selected. Referral called and faxed in to [phone], CM spoke with Misty Stanley at [facility] [phone] ext. [phone], she states, EDT for oxygen 1 to4 hours to [facility] ED for discharge. Updated Casimiro Needle RN and Dr. Elesa Massed on  Disposition plan.  No further ED CM needs identified

## 2014-07-16 NOTE — ED Notes (Signed)
Spoke with Case Management team member Burna Mortimer from [facility] [phone]) was informed that home oxygen tank will be delivered [facility] for pt prior to DC home, also pt will have home health RN visits to aid pt in care and follow up once DC home from this ED. EDP aware of all communication by phone with Care Management team. Pt informed of plan of care currently in place.

## 2014-07-16 NOTE — ED Notes (Addendum)
C/o SOB x 2 days-was seen by Pulm yesterday with same c/o-dx with GERD by Pulm-pt with noted SOB/anxious while talking then rate slows when not talking

## 2014-07-16 NOTE — Telephone Encounter (Signed)
Please have pt come into [facility] clinic tomorrow at 10:30 - please make sure that the overbook is on the schedule so that more isn't added on.  If she can't do this, then she can come to [facility] walk-in tomorrow at her convenience.  Thanks.

## 2014-07-16 NOTE — ED Notes (Signed)
[facility] brought home O2 to patients room and will report to patients home to set up continuous oxygen.  This RN placed 2L oxygen  [place] [place] on patient. Pt wheeled out to car.

## 2014-07-16 NOTE — Telephone Encounter (Signed)
I called pt and she reports she did not call our office and state the above, although she is at the ED currently. FYI for MW

## 2014-07-16 NOTE — ED Provider Notes (Addendum)
3:40 PM  Assumed care from Dr. Deretha Emory.  Pt is a 59 y.o. F who presents to the ED with cough, wheezing, shortness of breath several days. Finished a course of steroid on Saturday, 5 days ago. Denies any fever. Has had dry cough and states "I haven't been able to cough anything up". Here she is hypoxic and doesn't work oxygen at home. Sats of 83% after treatment on room air at rest. She is still wheezing with rhonchorous breath sounds. Chest x-ray shows early infiltrate. Patient was recently admitted to the hospital March 19. We'll treat her for healthcare associated pneumonia with vancomycin and Zosyn. We'll give continuous albuterol. She has received prednisone orally. Will obtain labs, cultures. Have recommended admission but this time patient is refusing.   4:50 PM  Pt's labs show mild leukocytosis with left shift. Normal lactate. Blood cultures are pending. Her lungs are now clear after continuous epidural but she is still diminished at her bases. When she gets up to use a bedside commode her sats drop into the low 80s and she becomes tachypneic, speaking short sentences. Have again discussed with her at length that I strongly recommended admission but she refuses still. States she is recently widowed and has pets to take care of at home and no one that can help her. I will contact patient's primary care provider Dr. Clelia Croft for close outpatient follow-up and discuss with case management, social work to set up resources including oxygen at home prior to discharge.  5:00 PM  D/w Shanda Bumps at Dr. Alver Fisher office.  They will call the patient tomorrow for close outpatient follow-up.  6:00 PM  Burna Mortimer with case management has helped to set up home O2 and home health care.  Pt will have to pay for oxygen out-of-pocket and she is aware of this and states she can pay for it.  We are working on getting oxygen delivered to the ED prior to discharge. Will discharge when oxygen is here. Patient will sign out AGAINST MEDICAL  ADVICE. Have discussed with her strict return precautions and reasons for wanting admission. Have discussed with her that this could lead to increased morbidity and mortality. She is competent to make this decision, not intoxicated. Advised her if she gets worse or changes her mind to return to the emergency department.  Rita Maw Livio Ledwith, DO 07/16/14 1835   Patient has an allergy listed to Avelox. States she's had swollen tongue with this. States she's had Levaquin however in the past without any difficulty. Will discharge on Levaquin.  Rita Maw Merryl Buckels, DO 07/16/14 [phone]

## 2014-07-16 NOTE — ED Provider Notes (Addendum)
CSN: [id number]     Arrival date & time 07/16/14  1403 History   First MD Initiated Contact with Patient 07/16/14 1406     Chief Complaint  Patient presents with  . Shortness of Breath     (Consider location/radiation/quality/duration/timing/severity/associated sxs/prior Treatment) Patient is a 59 y.o. female presenting with shortness of breath. The history is provided by the patient.  Shortness of Breath Associated symptoms: wheezing   Associated symptoms: no abdominal pain, no chest pain, no fever, no headaches and no rash    patient with a history of always thought to be COPD. Patient now being followed by pulmonary medicine they were thinking that perhaps it may not be COPD however pulmonary function test are planned. Her thinking it could be reflux related symptoms. Patient ran out of prednisone about a week ago patient is felt that she been having increased shortness of breath since that time developed acute wheezing last evening. That has persisted. Patient does have albuterol Atrovent inhalers. Patient denies any fevers. Patient had been feeling short of breath for 2 days but things got significant only worse last evening.  Past Medical History  Diagnosis Date  . Allergy   . Asthma   . Hypertension   . Hyperlipidemia   . Depression   . Anxiety   . COPD (chronic obstructive pulmonary disease)    Past Surgical History  Procedure Laterality Date  . Slipped capital femoral epiphysis pinning     Family History  Problem Relation Age of Onset  . Hypertension Mother   . Hyperlipidemia Mother   . Hypertension Brother   . Asthma Brother    History  Substance Use Topics  . Smoking status: Current Every Day Smoker -- 1.00 packs/day for 35 years    Types: Cigarettes  . Smokeless tobacco: Never Used  . Alcohol Use: No   OB History    No data available     Review of Systems  Constitutional: Negative for fever.  HENT: Negative for congestion.   Eyes: Negative for redness.   Respiratory: Positive for shortness of breath and wheezing.   Cardiovascular: Negative for chest pain.  Gastrointestinal: Negative for abdominal pain.  Genitourinary: Negative for dysuria.  Musculoskeletal: Negative for myalgias.  Skin: Negative for rash.  Neurological: Negative for headaches.  Hematological: Does not bruise/bleed easily.  Psychiatric/Behavioral: Negative for confusion.      Allergies  Terbinafine hcl; Avelox; Montelukast sodium; Pravastatin; and Zocor  Home Medications   Prior to Admission medications   Medication Sig Start Date End Date Taking? Authorizing Provider  albuterol (PROVENTIL HFA;VENTOLIN HFA) 108 (90 BASE) MCG/ACT inhaler Inhale 1-2 puffs into the lungs every 4 (four) hours as needed for wheezing or shortness of breath. 06/05/14   Sherren Mocha, MD  ALPRAZolam Prudy Feeler) 0.5 MG tablet Take 1 tablet in morning and 1 tablet at bedtime for sleep. 06/05/14   Sherren Mocha, MD  amLODipine-benazepril (LOTREL) 10-40 MG per capsule TAKE 1 CAPSULE DAILY 09/12/13   Maurice March, MD  amLODipine-olmesartan (AZOR) 5-20 MG per tablet Take 1 tablet by mouth daily. 07/15/14   Nyoka Cowden, MD  furosemide (LASIX) 40 MG tablet Take 1 tablet (40 mg total) by mouth daily. 07/15/14   Nyoka Cowden, MD  gabapentin (NEURONTIN) 400 MG capsule Take 1 capsule at bedtime or as directed. 01/16/14   Raelyn Ensign, PA  ipratropium (ATROVENT HFA) 17 MCG/ACT inhaler Inhale 2 puffs into the lungs every 6 (six) hours as needed for wheezing. 06/27/14  Arby Barrette, MD  potassium chloride SA (K-DUR,KLOR-CON) 20 MEQ tablet Take 1 tablet (20 mEq total) by mouth daily. 07/15/14   Nyoka Cowden, MD   BP 159/65 mmHg  Pulse 74  Temp(Src) 98.3 F (36.8 C) (Oral)  Resp 24  Ht  (1.753 m)  Wt 324 lb (146.965 kg)  BMI 47.82 kg/m2  SpO2 88% Physical Exam  Constitutional: She is oriented to person, place, and time. She appears well-developed and well-nourished. No distress.  HENT:  Head:  Normocephalic and atraumatic.  Mouth/Throat: Oropharynx is clear and moist.  Eyes: Conjunctivae and EOM are normal. Pupils are equal, round, and reactive to light.  Neck: Normal range of motion. Neck supple.  Cardiovascular: Normal rate, regular rhythm and normal heart sounds.   No murmur heard. Pulmonary/Chest: She is in respiratory distress. She has wheezes.  Abdominal: Soft. Bowel sounds are normal. There is no tenderness.  Musculoskeletal: Normal range of motion.  Neurological: She is alert and oriented to person, place, and time. No cranial nerve deficit. She exhibits normal muscle tone. Coordination normal.  Skin: Skin is warm. No rash noted.  Nursing note and vitals reviewed.   ED Course  Procedures (including critical care time) Labs Review Labs Reviewed - No data to display  Imaging Review No results found.   EKG Interpretation None      MDM   Final diagnoses:  SOB (shortness of breath)  COPD exacerbation    Patient with undiagnosed COPD. Seen pulmonary medicine pulmonary function test are planned. Patient with exacerbation of shortness of breath last night. With wheezing. Patient does have albuterol and Atrovent inhalers available. Patient stopped her prednisone about a week ago in the breathing is gotten worse since that stopped.  Patient with wheezing on the lung bases upon arrival here. Oxygen saturation little decreased 8887% room air. He should not normally on oxygen.   Patient receiving albuterol Atrovent treatment currently. Will get chest x-ray. We'll give 60 mg of prednisone. Patient may require additional albuterol treatments. We'll need to ensure that the oxygen saturation improves if not patient may require admission.    Vanetta Mulders, MD 07/16/14 1433  Vanetta Mulders, MD 07/16/14 1534  Patient was still some persistent wheezing still satting 8889% following the first nebulizer. Patient will be started on a continuous nebulizer. Chest x-ray is  pending. We'll go and start an IV and get labs.  Vanetta Mulders, MD 07/16/14 1534

## 2014-07-16 NOTE — ED Notes (Signed)
MD at bedside. 

## 2014-07-16 NOTE — Discharge Instructions (Signed)
Hypoxemia Hypoxemia occurs when your blood does not contain enough oxygen. The body cannot work well when it does not have enough oxygen because every part of your body needs oxygen. Oxygen travels to all parts of the body through your blood. Hypoxemia can develop suddenly or can come on slowly. CAUSES Some common causes of hypoxemia include:  Long-term (chronic) lung diseases, such as chronic obstructive pulmonary disease (COPD) or interstitial lung disease.  Disorders that affect breathing at night, such as sleep apnea.  Fluid buildup in your lungs (pulmonary edema).  Lung infection (pneumonia).  Lung or throat cancer.  Abnormal blood flow that bypasses the lungs (shunt).  Certain diseasesthat affect nerves or muscles.  A collapsed lung (pneumothorax).  A blood clot in the lungs (pulmonary embolus).  Certain types of heart disease.  Slow or shallow breathing (hypoventilation).  Certain medicines.  High altitudes.  Toxic chemicals and gases. SIGNS AND SYMPTOMS Not everyone who has hypoxemia will develop symptoms. If the hypoxemia developed quickly, you will likely have symptoms such as shortness of breath. If the hypoxemia came on slowly over months or years, you may not notice any symptoms. Symptoms can include:  Shortness of breath (dyspnea).  Bluish color of the skin, lips, or nail beds.  Breathing that is fast, noisy, or shallow.  A fast heartbeat.  Feeling tired or sleepy.  Being confused or feeling anxious. DIAGNOSIS To determine if you have hypoxemia, your health care provider may perform:  A physical exam.  Blood tests.  A pulse oximetry. A sensor will be put on your finger, toe, or earlobe to measure the percent of oxygen in your blood. TREATMENT You will likely be treated with oxygen therapy. Depending on the cause of your hypoxemia, you may need oxygen for a short time (weeks or months), or you may need it indefinitely. Your health care provider  may also recommend other therapies to treat the underlying cause of your hypoxemia. HOME CARE INSTRUCTIONS  Only take over-the-counter or prescription medicines as directed by your health care provider.  Follow oxygen safety measures if you are on oxygen therapy. These may include:  Always having a backup supply of oxygen.  Not allowing anyone to smoke around oxygen.  Handling the oxygen tanks carefully and as instructed.  If you smoke, quit. Stay away from people who smoke.  Follow up with your health care provider as directed. SEEK MEDICAL CARE IF:  You have any concerns about your oxygen therapy.  You still have trouble breathing.  You become short of breath when you exercise.  You are tired when you wake up.  You have a headache when you wake up. SEEK IMMEDIATE MEDICAL CARE IF:   Your breathing gets worse.  You have new shortness of breath with normal activity.  You have a bluish color of the skin, lips, or nail beds.  You have confusion or cloudy thinking.  You cough up dark mucus.  You have chest pain.  You have a fever. MAKE SURE YOU:  Understand these instructions.  Will watch your condition.  Will get help right away if you are not doing well or get worse. Document Released: 08/08/2010 Document Revised: 01/28/2013 Document Reviewed: 08/22/2012 [facility] Patient Information 2015 [place], [place]. This information is not intended to replace advice given to you by your health care provider. Make sure you discuss any questions you have with your health care provider.  Pneumonia Pneumonia is an infection of the lungs.  CAUSES Pneumonia may be caused by bacteria  or a virus. Usually, these infections are caused by breathing infectious particles into the lungs (respiratory tract). SIGNS AND SYMPTOMS   Cough.  Fever.  Chest pain.  Increased rate of breathing.  Wheezing.  Mucus production. DIAGNOSIS  If you have the common symptoms of pneumonia, your  health care provider will typically confirm the diagnosis with a chest X-ray. The X-ray will show an abnormality in the lung (pulmonary infiltrate) if you have pneumonia. Other tests of your blood, urine, or sputum may be done to find the specific cause of your pneumonia. Your health care provider may also do tests (blood gases or pulse oximetry) to see how well your lungs are working. TREATMENT  Some forms of pneumonia may be spread to other people when you cough or sneeze. You may be asked to wear a mask before and during your exam. Pneumonia that is caused by bacteria is treated with antibiotic medicine. Pneumonia that is caused by the influenza virus may be treated with an antiviral medicine. Most other viral infections must run their course. These infections will not respond to antibiotics.  HOME CARE INSTRUCTIONS   Cough suppressants may be used if you are losing too much rest. However, coughing protects you by clearing your lungs. You should avoid using cough suppressants if you can.  Your health care provider may have prescribed medicine if he or she thinks your pneumonia is caused by bacteria or influenza. Finish your medicine even if you start to feel better.  Your health care provider may also prescribe an expectorant. This loosens the mucus to be coughed up.  Take medicines only as directed by your health care provider.  Do not smoke. Smoking is a common cause of bronchitis and can contribute to pneumonia. If you are a smoker and continue to smoke, your cough may last several weeks after your pneumonia has cleared.  A cold steam vaporizer or humidifier in your room or home may help loosen mucus.  Coughing is often worse at night. Sleeping in a semi-upright position in a recliner or using a couple pillows under your head will help with this.  Get rest as you feel it is needed. Your body will usually let you know when you need to rest. PREVENTION A pneumococcal shot (vaccine) is  available to prevent a common bacterial cause of pneumonia. This is usually suggested for:  People over 17 years old.  Patients on chemotherapy.  People with chronic lung problems, such as bronchitis or emphysema.  People with immune system problems. If you are over 65 or have a high risk condition, you may receive the pneumococcal vaccine if you have not received it before. In some countries, a routine influenza vaccine is also recommended. This vaccine can help prevent some cases of pneumonia.You may be offered the influenza vaccine as part of your care. If you smoke, it is time to quit. You may receive instructions on how to stop smoking. Your health care provider can provide medicines and counseling to help you quit. SEEK MEDICAL CARE IF: You have a fever. SEEK IMMEDIATE MEDICAL CARE IF:   Your illness becomes worse. This is especially true if you are elderly or weakened from any other disease.  You cannot control your cough with suppressants and are losing sleep.  You begin coughing up blood.  You develop pain which is getting worse or is uncontrolled with medicines.  Any of the symptoms which initially brought you in for treatment are getting worse rather than better.  You  develop shortness of breath or chest pain. MAKE SURE YOU:   Understand these instructions.  Will watch your condition.  Will get help right away if you are not doing well or get worse. Document Released: 01/23/2005 Document Revised: 06/09/2013 Document Reviewed: 04/14/2010 [facility] Patient Information 2015 [place], [place]. This information is not intended to replace advice given to you by your health care provider. Make sure you discuss any questions you have with your health care provider.   Discharge Against Medical Advice I am signing this paper to show that I am leaving this hospital or health care center of my own free will. It is done against all medical advice. In doing so, I am releasing this  hospital or health care center and the attending physicians from any and all claims that I may want to make. I understand that further care has been recommended. My condition may worsen. This could cause me further bodily injury, illness, or even death. I do know that the medical staff has fully explained to me the risk that I am taking in leaving against medical advice. Document Released: 01/23/2005 Document Revised: 04/17/2011 Document Reviewed: 07/10/2006 [facility] Patient Information 2015 [place], [place]. This information is not intended to replace advice given to you by your health care provider. Make sure you discuss any questions you have with your health care provider.

## 2014-07-16 NOTE — ED Notes (Signed)
Social Worker/ Care Mangement consulted, EDP to speak with them

## 2014-07-16 NOTE — ED Notes (Addendum)
Crystal, RT is at bedside for assessment. Pt sats are now 93% on R/A, states she still feels sob. Pt states that she just finished a course of steriods, which made her feel much better, and she thinks she needs another course.

## 2014-07-16 NOTE — ED Notes (Signed)
Spoke with patient regards to need for admission based on EDPs assessment and diagnosis, pt states she does not have any family support to care for her pets at home, even neighbors that normally are available have left to be out of town. Again, will discuss nursing concern with Care Management/ Social Worker to see what assistance is available. Pt remains on cardiac monitor with cont POX, on RA POX noted to have readings decrease to 84% with exertion of lengthy talking. This RN expressed concern of pts current status. Pt understands.

## 2014-07-17 ENCOUNTER — Encounter: Payer: Self-pay | Admitting: *Deleted

## 2014-07-17 NOTE — Telephone Encounter (Signed)
Noted. Pt has not been compliant with medical care, still smoking despite respiratory illness worsening since her hosp 3 mos ago and now needing oxygen.  I have only seen pt once - 6 wks ago - pt was acutely ill and instructed to f/u in 3-4d which she did not.  Pt seems to be intelligent and has the capacity to understand the acuity of her illness as well as the potential complications inc death.   Please check in w/ pt to see how she is doing?  Any better?  Is she using nebulizer treatments?  REALLY hope she's been able to stop smoking while on oxygen.  I hope pt is able to f/u with [place] ASAP so we can ensure she is responding to treatment and alter it if not - if there is anything we can help with to make this easier on her in the meantime, please let me know.

## 2014-07-17 NOTE — Telephone Encounter (Signed)
Spoke with pt. She states she is not going anywhere, she is on oxygen. She has no one to take care of the animals at her house. Her husband has passed and she doesn't have anyone to take care of things at home. I advised pt that it is very important that she take all of her meds and if she gets any worse, to go to the ED right away. Pt expressed understanding. She says this has been going on since March. She went to see Dr Sherene Sires on Wednesday and he listened to her lungs, told her she was fine. The next day she was in the ED with PNA.

## 2014-07-21 ENCOUNTER — Encounter: Payer: Self-pay | Admitting: Medical

## 2014-07-21 ENCOUNTER — Ambulatory Visit (INDEPENDENT_AMBULATORY_CARE_PROVIDER_SITE_OTHER): Payer: BLUE CROSS/BLUE SHIELD | Admitting: Medical

## 2014-07-21 ENCOUNTER — Telehealth: Payer: Self-pay | Admitting: Internal Medicine

## 2014-07-21 VITALS — BP 120/76 | HR 74 | Temp 97.6°F | Ht 69.5 in | Wt 320.0 lb

## 2014-07-21 DIAGNOSIS — R918 Other nonspecific abnormal finding of lung field: Secondary | ICD-10-CM | POA: Diagnosis not present

## 2014-07-21 DIAGNOSIS — M4806 Spinal stenosis, lumbar region: Secondary | ICD-10-CM | POA: Diagnosis not present

## 2014-07-21 DIAGNOSIS — I10 Essential (primary) hypertension: Secondary | ICD-10-CM

## 2014-07-21 DIAGNOSIS — M48061 Spinal stenosis, lumbar region without neurogenic claudication: Secondary | ICD-10-CM

## 2014-07-21 DIAGNOSIS — J441 Chronic obstructive pulmonary disease with (acute) exacerbation: Secondary | ICD-10-CM | POA: Diagnosis not present

## 2014-07-21 MED ORDER — PREDNISONE 20 MG PO TABS: ORAL_TABLET | ORAL | Status: DC

## 2014-07-21 MED ORDER — AMLODIPINE-OLMESARTAN 5-20 MG PO TABS: 1.0000 | ORAL_TABLET | Freq: Every day | ORAL | Status: DC

## 2014-07-21 NOTE — Progress Notes (Signed)
Subjective:    Patient ID: Rita Watkins, female    DOB: 1955/08/12, 59 y.o.   MRN: [id number]  HPI  Pt in for first time. Pt went to ED recently past week . Pt was seen by Dr. Sherene Sires day before. Pt states went to Dr. Sherene Sires pulmonologist she got the impresssion he thought primary issue was Genella Rife. He recommended pepcid and prilosec. Then a day later she had a lot of pulmonary symptoms.  Pt has been to ED 4 times since march. Most recently given oxygen, levofloxin, prednisone, and some inhalers. Has Atrovent and proair.  Pt not happy with prior pcp office. Former retired and had  new MD. She was  not happy with new MD.  Has been 5 days since last seen by ED. She did not want to be admitted. Signed out AMA.  In ED had early infection at right lung base.Has been on 02 2 L for last 5 days.   Pt is smoking. She was smoking 1.5 pack a day in march. Now down to 5 cigarettes a day. Pt has smoked for at least 25 yrs.    htn- pt hasa azor and lasix.   Hypokalemia- since being on the lasix. But now on k tabs.  Lumbar stenosis- some neuropathy pain associated. On neurontin. She is helped a lot with this.     Review of Systems  Constitutional: Negative for fever, chills, diaphoresis, activity change and fatigue.  Respiratory: Negative for cough, chest tightness and shortness of breath.        Minimal faint wheeze now. Compared to before.  Cardiovascular: Negative for chest pain, palpitations and leg swelling.  Gastrointestinal: Negative for nausea, vomiting and abdominal pain.  Musculoskeletal: Negative for neck pain and neck stiffness.  Neurological: Negative for dizziness, tremors, seizures, syncope, facial asymmetry, speech difficulty, weakness, light-headedness, numbness and headaches.  Psychiatric/Behavioral: Negative for behavioral problems, confusion and agitation. The patient is not nervous/anxious.     Past Medical History  Diagnosis Date  . Allergy   . Asthma   . Hypertension   .  Hyperlipidemia   . Depression   . Anxiety   . COPD (chronic obstructive pulmonary disease)     History   Social History  . Marital Status: Widowed    Spouse Name: N/A  . Number of Children: N/A  . 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 Smoking status: Current Every Day Smoker -- 1.00 packs/day for 35 years    Types: Cigarettes  . Smokeless tobacco: Never Used  . Alcohol Use: No  . Drug Use: No  . Sexual Activity: Not on file   Other Topics Concern  . Not on file   Social History Narrative   Does not exercise.    Past Surgical History  Procedure Laterality Date  . Slipped capital femoral epiphysis pinning    . Hip pinning      3 each hip  . Foot surgery      Left    Family History  Problem Relation Age of Onset  . Hypertension Mother   . Hyperlipidemia Mother   . Hypertension Brother   . Asthma Brother     Allergies  Allergen Reactions  . Terbinafine Hcl Swelling  . Avelox [Moxifloxacin Hcl In Nacl]   . Montelukast Sodium Other (See Comments)    Insomnia  . Pravastatin Other (See Comments)    Myalgias on   .  Zocor [Simvastatin - High Dose]     Current Outpatient Prescriptions on File Prior to Visit  Medication Sig Dispense Refill  . albuterol (PROVENTIL HFA;VENTOLIN HFA) 108 (90 BASE) MCG/ACT inhaler Inhale 1-2 puffs into the lungs every 4 (four) hours as needed for wheezing or shortness of breath. 1 Inhaler 11  . albuterol (PROVENTIL HFA;VENTOLIN HFA) 108 (90 BASE) MCG/ACT inhaler Inhale 2 puffs into the lungs every 4 (four) hours as needed for wheezing or shortness of breath. 1 Inhaler 0  . ALPRAZolam (XANAX) 0.5 MG tablet Take 1 tablet in morning and 1 tablet at bedtime for sleep. 60 tablet 2  . furosemide (LASIX) 40 MG tablet Take 1 tablet (40 mg total) by mouth daily. 30 tablet 11  . gabapentin (NEURONTIN) 400 MG capsule Take 1 capsule at bedtime or as directed. 90 capsule 3  .  ipratropium (ATROVENT HFA) 17 MCG/ACT inhaler Inhale 2 puffs into the lungs every 6 (six) hours as needed for wheezing. 1 Inhaler 12  . levofloxacin (LEVAQUIN) 500 MG tablet Take 1 tablet (500 mg total) by mouth daily. 10 tablet 0  . potassium chloride SA (K-DUR,KLOR-CON) 20 MEQ tablet Take 1 tablet (20 mEq total) by mouth daily. 30 tablet 11   Current Facility-Administered Medications on File Prior to Visit  Medication Dose Route Frequency Provider Last Rate Last Dose  . albuterol (PROVENTIL) (2.5 MG/3ML) 0.083% nebulizer solution 2.5 mg  2.5 mg Nebulization Once Sherren Mocha, MD      . ipratropium (ATROVENT) nebulizer solution 0.5 mg  0.5 mg Nebulization Once Sherren Mocha, MD        BP 120/76 mmHg  Pulse 74  Temp(Src) 97.6 F (36.4 C) (Oral)  Ht 5' 9.5" (1.765 m)  Wt 320 lb (145.151 kg)  BMI 46.59 kg/m2  SpO2 95%       Objective:   Physical Exam   General  Mental Status - Alert. General Appearance - Well groomed. Not in acute distress. Obese  Skin Rashes- No Rashes.  HEENT Head- Normal. Ear Auditory Canal - Left- Normal. Right - Normal.Tympanic Membrane- Left- Normal. Right- Normal. Eye Sclera/Conjunctiva- Left- Normal. Right- Normal. Nose & Sinuses Nasal Mucosa- Left-  Not boggy or Congested. Right-  Not  boggy or Congested. Mouth & Throat Lips: Upper Lip- Normal: no dryness, cracking, pallor, cyanosis, or vesicular eruption. Lower Lip-Normal: no dryness, cracking, pallor, cyanosis or vesicular eruption. Buccal Mucosa- Bilateral- No Aphthous ulcers. Oropharynx- No Discharge or Erythema. Tonsils: Characteristics- Bilateral- No Erythema or Congestion. Size/Enlargement- Bilateral- No enlargement. Discharge- bilateral-None.  Neck Neck- Supple. No Masses. No jvd.   Chest and Lung Exam Auscultation: Breath Sounds:- even and unlabored but shallow and scattered occasional expiratory wheeze.  Cardiovascular Auscultation:Rythm- Regular, rate and rhythm. Murmurs & Other  Heart Sounds:Ausculatation of the heart reveal- No Murmurs.  Lymphatic Head & Neck General Head & Neck Lymphatics: Bilateral: Description- No Localized lymphadenopathy.  Lower ext- no pedal edema and no homans signs.      Assessment & Plan:

## 2014-07-21 NOTE — Assessment & Plan Note (Addendum)
Stable presently/improved. Stay on atrovent, proair, oxygen at home 2 L, and I am making prednisone 5 days course available. If she get acute excacerbation as before. But not to use unless it comes on quickly. Also recommend getting 02 sat monitor so she can monitor if she is getting progressive decrease. If she drops to less than 90% then recommend ED evaluation to avoid severe condition as she had on last ED evaluation.  Pt wants to be referred to different pulmonologist.

## 2014-07-21 NOTE — Patient Instructions (Addendum)
COPD exacerbation Stable presently/improved. Stay on atroven, proair, oxygen at home 2 L, and I am making prednisone 5 days course available. If she get acute excacerbation as before. But not to use unless it comes on quickly. Also recommend getting 02 sat monitor so she can monitor if she is getting progressive decrease. If she drops to less than 90% then recommend ED evaluation to avoid severe condition as she had on last ED evaluation.  Pt wants to be referred to different pulmonologist.  HTN (hypertension) Will refill her azor  Pulmonary infiltrate in right lung on chest x-ray In 5 days will get cxr after antibiotic finished. On same day will get cbc and cmp.  Spinal stenosis of lumbar region Pt has some neuropathy with this. Some radiating pain to lower ext at times. Neurontin does help.    Follow up in 7 days or as needed

## 2014-07-21 NOTE — Assessment & Plan Note (Signed)
In 5 days will get cxr after antibiotic finished. On same day will get cbc and cmp.

## 2014-07-21 NOTE — Telephone Encounter (Signed)
lmtcb

## 2014-07-21 NOTE — Progress Notes (Signed)
Pre visit review using our clinic review tool, if applicable. No additional management support is needed unless otherwise documented below in the visit note. 

## 2014-07-21 NOTE — Telephone Encounter (Signed)
Please check in w/ pt to see how she is doing? Any better? Is she using nebulizer treatments? REALLY hope she's been able to stop smoking while on oxygen.   Pt is at [facility] right now seeing a doctor. She states she will call us if she needs [place].

## 2014-07-21 NOTE — Assessment & Plan Note (Signed)
Pt has some neuropathy with this. Some radiating pain to lower ext at times. Neurontin does help.

## 2014-07-21 NOTE — Assessment & Plan Note (Signed)
Will refill her azor

## 2014-07-22 ENCOUNTER — Ambulatory Visit: Payer: BLUE CROSS/BLUE SHIELD | Admitting: Adult Health

## 2014-07-22 LAB — CULTURE, BLOOD (ROUTINE X 2)
CULTURE: NO GROWTH
Culture: NO GROWTH

## 2014-07-22 NOTE — Telephone Encounter (Signed)
lmtcb x2 for pt. 

## 2014-07-23 NOTE — Telephone Encounter (Signed)
lmtcb X3 for pt. Pt already scheduled for appt with TP. Will close message per triage protocol

## 2014-07-24 ENCOUNTER — Ambulatory Visit (HOSPITAL_BASED_OUTPATIENT_CLINIC_OR_DEPARTMENT_OTHER)
Admission: RE | Admit: 2014-07-24 | Discharge: 2014-07-24 | Disposition: A | Discharge status: Home / Self Care | Payer: BLUE CROSS/BLUE SHIELD | Source: Ambulatory Visit | Attending: Medical | Admitting: Medical

## 2014-07-24 ENCOUNTER — Other Ambulatory Visit (INDEPENDENT_AMBULATORY_CARE_PROVIDER_SITE_OTHER): Payer: BLUE CROSS/BLUE SHIELD

## 2014-07-24 DIAGNOSIS — R918 Other nonspecific abnormal finding of lung field: Secondary | ICD-10-CM

## 2014-07-24 DIAGNOSIS — J441 Chronic obstructive pulmonary disease with (acute) exacerbation: Secondary | ICD-10-CM | POA: Diagnosis present

## 2014-07-24 DIAGNOSIS — J82 Pulmonary eosinophilia, not elsewhere classified: Secondary | ICD-10-CM | POA: Diagnosis not present

## 2014-07-24 DIAGNOSIS — F17210 Nicotine dependence, cigarettes, uncomplicated: Secondary | ICD-10-CM | POA: Diagnosis not present

## 2014-07-24 DIAGNOSIS — J8281 Chronic eosinophilic pneumonia: Secondary | ICD-10-CM

## 2014-07-24 LAB — COMPREHENSIVE METABOLIC PANEL
ALBUMIN: 3.2 g/dL — AB (ref 3.5–5.2)
ALK PHOS: 73 U/L (ref 39–117)
ALT: 13 U/L (ref 0–35)
AST: 11 U/L (ref 0–37)
BILIRUBIN TOTAL: 0.7 mg/dL (ref 0.2–1.2)
BUN: 15 mg/dL (ref 6–23)
CHLORIDE: 102 meq/L (ref 96–112)
CO2: 24 meq/L (ref 19–32)
Calcium: 8.2 mg/dL — ABNORMAL LOW (ref 8.4–10.5)
Creat: 0.77 mg/dL (ref 0.50–1.10)
Glucose, Bld: 76 mg/dL (ref 70–99)
Potassium: 4 mEq/L (ref 3.5–5.3)
Sodium: 138 mEq/L (ref 135–145)
TOTAL PROTEIN: 6.1 g/dL (ref 6.0–8.3)

## 2014-07-24 LAB — CBC WITH DIFFERENTIAL/PLATELET
Basophils Absolute: 0 10*3/uL (ref 0.0–0.1)
Basophils Relative: 0 % (ref 0–1)
EOS PCT: 5 % (ref 0–5)
Eosinophils Absolute: 0.7 10*3/uL (ref 0.0–0.7)
HEMATOCRIT: 43 % (ref 36.0–46.0)
HEMOGLOBIN: 14.3 g/dL (ref 12.0–15.0)
LYMPHS ABS: 2.7 10*3/uL (ref 0.7–4.0)
LYMPHS PCT: 20 % (ref 12–46)
MCH: 28.8 pg (ref 26.0–34.0)
MCHC: 33.3 g/dL (ref 30.0–36.0)
MCV: 86.5 fL (ref 78.0–100.0)
MONO ABS: 1.4 10*3/uL — AB (ref 0.1–1.0)
MPV: 9.8 fL (ref 8.6–12.4)
Monocytes Relative: 10 % (ref 3–12)
NEUTROS ABS: 8.8 10*3/uL — AB (ref 1.7–7.7)
Neutrophils Relative %: 65 % (ref 43–77)
Platelets: 263 10*3/uL (ref 150–400)
RBC: 4.97 MIL/uL (ref 3.87–5.11)
RDW: 14.6 % (ref 11.5–15.5)
WBC: 13.5 10*3/uL — AB (ref 4.0–10.5)

## 2014-07-28 ENCOUNTER — Ambulatory Visit: Payer: BLUE CROSS/BLUE SHIELD | Admitting: Medical

## 2014-07-29 ENCOUNTER — Telehealth: Payer: Self-pay | Admitting: Medical

## 2014-07-29 NOTE — Telephone Encounter (Signed)
Pt was no show 07/28/14 9:15 am, Pt called 07/28/14 9:03am and rescheduled for 07/30/14, follow up 15 appt, charge?

## 2014-07-29 NOTE — Telephone Encounter (Signed)
New pt. Not sure if she has insurance. But please call her and ask her to follow up. She has had a time recently with breathing issues. I wantted to check on her in office and avoid her having another ED visit if possible. Will you call her and see what is going on. No charge for now.

## 2014-07-29 NOTE — Telephone Encounter (Signed)
Pt states she will be here tomorrow. She told me when I called to confirm the appt that I was her 3rd reminder call.

## 2014-07-30 ENCOUNTER — Encounter: Payer: Self-pay | Admitting: Adult Health

## 2014-07-30 ENCOUNTER — Ambulatory Visit: Payer: BLUE CROSS/BLUE SHIELD | Admitting: Adult Health

## 2014-07-30 ENCOUNTER — Ambulatory Visit (INDEPENDENT_AMBULATORY_CARE_PROVIDER_SITE_OTHER): Payer: BLUE CROSS/BLUE SHIELD | Admitting: Adult Health

## 2014-07-30 ENCOUNTER — Telehealth: Payer: Self-pay | Admitting: Adult Health

## 2014-07-30 ENCOUNTER — Ambulatory Visit (INDEPENDENT_AMBULATORY_CARE_PROVIDER_SITE_OTHER): Payer: BLUE CROSS/BLUE SHIELD | Admitting: Medical

## 2014-07-30 VITALS — BP 107/71 | HR 69 | Temp 98.7°F | Ht 69.0 in | Wt 322.0 lb

## 2014-07-30 VITALS — BP 140/71 | HR 63 | Temp 98.4°F | Ht 69.5 in | Wt 322.0 lb

## 2014-07-30 DIAGNOSIS — F17210 Nicotine dependence, cigarettes, uncomplicated: Secondary | ICD-10-CM

## 2014-07-30 DIAGNOSIS — R918 Other nonspecific abnormal finding of lung field: Secondary | ICD-10-CM | POA: Diagnosis not present

## 2014-07-30 DIAGNOSIS — J441 Chronic obstructive pulmonary disease with (acute) exacerbation: Secondary | ICD-10-CM | POA: Diagnosis not present

## 2014-07-30 DIAGNOSIS — D72829 Elevated white blood cell count, unspecified: Secondary | ICD-10-CM | POA: Diagnosis not present

## 2014-07-30 DIAGNOSIS — J449 Chronic obstructive pulmonary disease, unspecified: Secondary | ICD-10-CM

## 2014-07-30 DIAGNOSIS — H6501 Acute serous otitis media, right ear: Secondary | ICD-10-CM

## 2014-07-30 DIAGNOSIS — I10 Essential (primary) hypertension: Secondary | ICD-10-CM

## 2014-07-30 DIAGNOSIS — Z720 Tobacco use: Secondary | ICD-10-CM | POA: Diagnosis not present

## 2014-07-30 DIAGNOSIS — J9611 Chronic respiratory failure with hypoxia: Secondary | ICD-10-CM

## 2014-07-30 DIAGNOSIS — M7989 Other specified soft tissue disorders: Secondary | ICD-10-CM

## 2014-07-30 HISTORY — DX: Elevated white blood cell count, unspecified: D72.829

## 2014-07-30 MED ORDER — AZITHROMYCIN 250 MG PO TABS: ORAL_TABLET | ORAL | Status: DC

## 2014-07-30 NOTE — Patient Instructions (Addendum)
Leukocytosis Future cbc to do in about 10 days. When not exhibiting any infectious type symptoms advised to get test done. Currently likely rt OM.  Otitis media rx azitromycin. I think slight pain below rt ear is from OM. Will see if this clears with use of antibiotic.  COPD pfts pending  Stable now and continue treatment per pulmonology instruction today.  HTN (hypertension) Take azor 5/20 2 tab a day.    Follow up in 3-4 wks or as needed

## 2014-07-30 NOTE — Assessment & Plan Note (Signed)
Stable now and continue treatment per pulmonology instruction today.

## 2014-07-30 NOTE — Telephone Encounter (Signed)
OK 

## 2014-07-30 NOTE — Progress Notes (Signed)
Subjective:    Patient ID: Rita Watkins, female    DOB: 07/15/1955, 59 y.o.   MRN: [id number]  HPI  Pt just saw pulmonologist. Pt was told to use atrovent 2 inh tid. Pt checks her 02 sat and usually 94-95%. She uses albuterol as needed. She has oxygen available if needed. But has not had to use the o2.  Pt bp is borderlline high. Pt was increased her azor to 2 tabs a day by pulmonology.  Pt has pft test is scheduled July 22nd 11 am  Pt did have leukocytosis on last cbc. She had just gotten of of steroids given by ED. Cxr was normal on 07-24-2014.  Some nasal congestion and runny nose. When has random cough. Mild pain below rt ear. This is over last week.    Review of Systems  Constitutional: Negative for fever, chills and fatigue.  HENT: Positive for congestion and rhinorrhea. Negative for ear discharge, ear pain, facial swelling, nosebleeds, postnasal drip, sinus pressure and sneezing.        Pain below her rt ear when coughs.  Respiratory: Positive for cough. Negative for chest tightness and wheezing.        Occasional cough.  Cardiovascular: Negative for chest pain and palpitations.  Gastrointestinal: Negative for abdominal pain.    Past Medical History  Diagnosis Date  . Allergy   . Asthma   . Hypertension   . Hyperlipidemia   . Depression   . Anxiety   . COPD (chronic obstructive pulmonary disease)     History   Social History  . Marital Status: Widowed    Spouse Name: N/A  . Number of Children: N/A  . 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 Smoking status: Current Every Day Smoker -- 1.00 packs/day for 35 years    Types: Cigarettes  . Smokeless tobacco: Never Used  . Alcohol Use: No  . Drug Use: No  . Sexual Activity: Not on file   Other Topics Concern  . Not on file   Social History Narrative   Does not exercise.    Past Surgical History  Procedure Laterality Date  . Slipped  capital femoral epiphysis pinning    . Hip pinning      3 each hip  . Foot surgery      Left    Family History  Problem Relation Age of Onset  . Hypertension Mother   . Hyperlipidemia Mother   . Hypertension Brother   . Asthma Brother     Allergies  Allergen Reactions  . Terbinafine Hcl Swelling  . Avelox [Moxifloxacin Hcl In Nacl]   . Montelukast Sodium Other (See Comments)    Insomnia  . Pravastatin Other (See Comments)    Myalgias on   . Zocor [Simvastatin - High Dose]     Current Outpatient Prescriptions on File Prior to Visit  Medication Sig Dispense Refill  . albuterol (PROVENTIL HFA;VENTOLIN HFA) 108 (90 BASE) MCG/ACT inhaler Inhale 1-2 puffs into the lungs every 4 (four) hours as needed for wheezing or shortness of breath. 1 Inhaler 11  . ALPRAZolam (XANAX) 0.5 MG tablet Take 1 tablet in morning and 1 tablet at bedtime for sleep. 60 tablet 2  . amLODipine-benazepril (LOTREL) 10-40 MG per capsule Take 1 capsule by mouth daily.    . furosemide (LASIX) 40 MG tablet Take 1 tablet (40 mg total) by mouth daily. 30 tablet  11  . gabapentin (NEURONTIN) 400 MG capsule Take 1 capsule at bedtime or as directed. 90 capsule 3  . ipratropium (ATROVENT HFA) 17 MCG/ACT inhaler Inhale 2 puffs into the lungs every 6 (six) hours as needed for wheezing. 1 Inhaler 12  . potassium chloride SA (K-DUR,KLOR-CON) 20 MEQ tablet Take 1 tablet (20 mEq total) by mouth daily. 30 tablet 11   No current facility-administered medications on file prior to visit.    BP 140/71 mmHg  Pulse 63  Temp(Src) 98.4 F (36.9 C) (Oral)  Ht 5' 9.5" (1.765 m)  Wt 322 lb (146.058 kg)  BMI 46.89 kg/m2  SpO2 98%       Objective:   Physical Exam  General  Mental Status - Alert. General Appearance - Well groomed. Not in acute distress.  Skin Rashes- No Rashes.  HEENT Head- Normal. Ear Auditory Canal - Left- Normal. Right - Normal.Tympanic Membrane- Left- Normal. Right- bright red tm centrally.  Mild pain just beneath rt ear. But no obvious lymph node. Eye Sclera/Conjunctiva- Left- Normal. Right- Normal. Nose & Sinuses Nasal Mucosa- Left-  Boggy and Congested. Right-  Boggy and  Congested.Bilateral no  maxillary and no  frontal sinus pressure. Mouth & Throat Lips: Upper Lip- Normal: no dryness, cracking, pallor, cyanosis, or vesicular eruption. Lower Lip-Normal: no dryness, cracking, pallor, cyanosis or vesicular eruption. Buccal Mucosa- Bilateral- No Aphthous ulcers. Oropharynx- No Discharge or Erythema. Tonsils: Characteristics- Bilateral- No Erythema or Congestion. Size/Enlargement- Bilateral- No enlargement. Discharge- bilateral-None.  Neck Neck- Supple. No Masses.   Chest and Lung Exam Auscultation: Breath Sounds:-Clear even and unlabored.  Cardiovascular Auscultation:Rythm- Regular, rate and rhythm. Murmurs & Other Heart Sounds:Ausculatation of the heart reveal- No Murmurs.  Lymphatic Head & Neck General Head & Neck Lymphatics: Bilateral: Description- No Localized lymphadenopathy.      Assessment & Plan:

## 2014-07-30 NOTE — Telephone Encounter (Signed)
Please advise RA thanks 

## 2014-07-30 NOTE — Patient Instructions (Addendum)
Stop Lotrel .  Increase AZOR 2 tabs daily .  Follow up with primary provider for blood pressure management .  Would avoid ACE inhibitors if possible due to cough .  Continue on Atrovent inhaler 2puffs Four times a day  .  PFT in 3-4 weeks at [facility] office  Follow up . In 4 weeks with Dr. Vassie Loll  And As needed   Please contact office for sooner follow up if symptoms do not improve or worsen or seek emergency care  Work on smoking .

## 2014-07-30 NOTE — Assessment & Plan Note (Signed)
Future cbc to do in about 10 days. When not exhibiting any infectious type symptoms advised to get test done. Currently likely rt OM.

## 2014-07-30 NOTE — Assessment & Plan Note (Signed)
Return for PFT  

## 2014-07-30 NOTE — Assessment & Plan Note (Signed)
rx azitromycin. I think slight pain below rt ear is from OM. Will see if this clears with use of antibiotic.

## 2014-07-30 NOTE — Assessment & Plan Note (Signed)
Cessation discussed 

## 2014-07-30 NOTE — Assessment & Plan Note (Signed)
Recently placed on oxygen at 2l/m  She does not have desats presently  May have been due to associated PNA  Will cont for now to keep sats >90% with walking and At bedtime   Recheck on return and set up for ONO to see if still needed.  She is at risk for OSA

## 2014-07-30 NOTE — Assessment & Plan Note (Signed)
Recurrent exacerbation in active smoker  Would benefit off ACE as more prone to cough and difficult to assess/Tx COPD with frequent flares Encouraged on smoking cessation  For now leave on Atrovent , will most likely benefit from newer agents such as ANORO or BREO/Spiriva combo  Will decide after PFT   Plan  Stop Lotrel .  Increase AZOR 2 tabs daily .  Follow up with primary provider for blood pressure management .  Would avoid ACE inhibitors if possible due to cough .  Continue on Atrovent inhaler 2puffs Four times a day  .  PFT in 3-4 weeks at [facility] office  Follow up . In 4 weeks with Dr. Vassie Loll  And As needed   Please contact office for sooner follow up if symptoms do not improve or worsen or seek emergency care  Work on smoking .

## 2014-07-30 NOTE — Progress Notes (Signed)
Pre visit review using our clinic review tool, if applicable. No additional management support is needed unless otherwise documented below in the visit note. 

## 2014-07-30 NOTE — Telephone Encounter (Signed)
Fine with me

## 2014-07-30 NOTE — Assessment & Plan Note (Signed)
Suspect peripheral edema is multifactoral with Venous insufficiency , morbid obesity , side effects of high dose norvasc  Recent BNP min elevated. May have component of Diastolic Dysfunction as well  Consider Echo in future if dyspnea persists.  Cont on lasix along with leg elevation , low salt diet. TEDS stockings would help.

## 2014-07-30 NOTE — Telephone Encounter (Signed)
Patient would like to switch from Dr. Sherene Sires to Dr. Vassie Loll so she can be seen in [facility] office.  Dr. Sherene Sires - ok to switch? Dr. Vassie Loll - ok to switch?  Please advise.

## 2014-07-30 NOTE — Assessment & Plan Note (Signed)
PNA -RLL  Resolved with abx  Clinically improving

## 2014-07-30 NOTE — Progress Notes (Signed)
Chart and office note reviewed in detail  > agree with a/p as outlined    

## 2014-07-30 NOTE — Progress Notes (Signed)
Subjective:    Patient ID: Rita Watkins, female    DOB: June 01, 1955,  MRN: [id number]  HPI  2 yowf  Active smoker with onset 2008 when moved from [place] sneezing/tendency to sinus infections spring >> fall with occ bronchitis but never inhalers then much worse since March 2016 with new dx of AB : admit:   Admit date: 04/25/2014 Discharge date: 04/27/2014  Discharge Diagnoses:  Principal Problem:  Acute bronchiolitis due to other infectious organisms  HTN (hypertension)  Depression with anxiety  Acute respiratory failure with hypoxia   Discharge Condition: improved  Diet recommendation: cardiac  Filed Weights   04/25/14 2139 04/26/14 0239  Weight: 129.729 kg (286 lb) 163.749 kg (361 lb)    History of present illness:  Rita Watkins is a 59 y.o. female with Past medical history of hypertension, depression, anxiety, dyslipidemia, neuropathy, history of obstructive lung disease ) of seasonal allergies. The patient is presenting with complaints of shortness of breath and cough. She mentions that in [place] she generally has shortness of breath with wheezing and her PCP gives her albuterol inhaler. Since last Thursday she has been having progressively worsening wheezing and has went to [facility]. She was given Augmentin and albuterol inhaler ED at Despite taking that her symptoms are not improved and therefore she came to the ER. She denies any chest pain fever or chills. She complains of a dry cough. She denies any leg tenderness or recent travel. She has chronic leg swelling. She denies any abdominal pain. No diarrhea nausea vomiting or choking episode. No sick contact or travel outside of states. Patient is active smoker currently smoking 10 cigarettes a day  Hospital Course:  Acute bronchiolitis due to other infectious organisms -nebs, abx, steroids Patient will need outpatient workup with PCP and pulmonologist for diagnosing the etiology of her  obstructive lung disease  Tobacco abuse -encourage cessation -given nicotine patch  Hypertension. Continue home medication.  Depression. Continue home treatment.       07/15/2014 1st [facility] office visit/ [place]     ? Copd? Asthma ? Ace case  Chief Complaint  Patient presents with  . Pulmonary Consult    Referred by Dr. Arby Barrette. Pt states recently dxed with COPD.  Pt c/o SOB and cough since March 2016.  Her cough is non prod and bothers her on and off. She states that her SOB comes and goes and can be with or without any exertion.  She is using albuterol inhaler at least 2 x per day and uses atrovent bib.  only 100% better since March 2016 while on prednisone last finished 07/09/14 but starting to note cough again  worsening despite multiple inhalers while on lotrel Note sob with / without exertion and sporadic dry cough day > noct on lotrel chronically >>d/c Lotrel   07/30/2014 ER follow up  Pt returns for ER follow up  Seen in ER on 6/9 with COPD flare and PNA  -RLL  tx with Vanc and Zosyn and discharged on Levaquin .  She declined admission to hospital.  Seen back with PCP with cxr on 6/17 showing no PNA.  She is feeling better but still weak.  Has cut back on smoking. Discussed cessation  Has had several ER visits over last 3 months with COPD flares .  Seen by pulmonary Dr. Sherene Sires  6/8 for evaluation for possible COPD .  She was taken off ACE inhibitor due to recurrent cough and wheezing .  recommended for  PFT after few weeks off ACE .  She did not tolerate of Lotrel with elevated b/p on AZOR.  She has went back on Lotrel.  We discussed cough side effects of ACE and will adjust  AZOR to better control b/p .  She would like to change to [facility] office -close to home and PCP .  Denies chest pain, orthopnea, increased edema , fever or discolored mucus .  No hemoptysis .  She has chronic leg swelling, better on laisx  Previous bnp minimally elevated. No previous Echo .    She has been placed on O2 but says she is not using as O2 sats >90 when she checks them Denies known sleep apnea. Unclear if has had official sleep study.       Current Medications, Allergies, Complete Past Medical History, Past Surgical History, Family History, and Social History were reviewed in [employer] record.              Review of Systems   Constitutional:   No  weight loss, night sweats,  Fevers, chills,  +fatigue, or  lassitude.  HEENT:   No headaches,  Difficulty swallowing,  Tooth/dental problems, or  Sore throat,                No sneezing, itching, ear ache, nasal congestion, post nasal drip,   CV:  No chest pain,  Orthopnea, PND, swelling in lower extremities, anasarca, dizziness, palpitations, syncope.   GI  No heartburn, indigestion, abdominal pain, nausea, vomiting, diarrhea, change in bowel habits, loss of appetite, bloody stools.   Resp:    No excess mucus, no productive cough,  No non-productive cough,  No coughing up of blood.  No change in color of mucus.  No wheezing.  No chest wall deformity  Skin: no rash or lesions.  GU: no dysuria, change in color of urine, no urgency or frequency.  No flank pain, no hematuria   MS:  No joint pain or swelling.  No decreased range of motion.  No back pain.  Psych:  No change in mood or affect. No depression or anxiety.  No memory loss.          Objective:   Physical Exam  amb wf nad , morbidly obese    Vital signs reviewed   HEENT: nl dentition, turbinates, and orophanx. Nl external ear canals without cough reflex Class 2 airway    NECK :  without JVD/Nodes/TM/ nl carotid upstrokes bilaterally   LUNGS: no acc muscle use, clear to A and P bilaterally without cough on insp or exp maneuvers   CV:  RRR  no s3 or murmur or increase in P2, 1+ pitting bilateral lower ext  edema   ABD:  soft and nontender with nl excursion in the supine position. No bruits or organomegaly, bowel  sounds nl  MS:  warm without deformities, calf tenderness, cyanosis or clubbing  SKIN: warm and dry without lesions    NEURO:  alert, approp, no deficits         CXR:  06/27/14 Moderate-to-marked changes of acute bronchitis and/or asthma superimposed upon COPD without localized airspace pneumonia.         Assessment & Plan:

## 2014-07-30 NOTE — Assessment & Plan Note (Signed)
Take azor 5/20 2 tab a day.

## 2014-08-04 ENCOUNTER — Telehealth: Payer: Self-pay | Admitting: Adult Health

## 2014-08-04 ENCOUNTER — Telehealth: Payer: Self-pay | Admitting: Medical

## 2014-08-04 MED ORDER — AMLODIPINE-OLMESARTAN 5-20 MG PO TABS: ORAL_TABLET | ORAL | Status: DC

## 2014-08-04 NOTE — Telephone Encounter (Signed)
Remind pt on labs ordered future but not done. Get lpn to call.

## 2014-08-04 NOTE — Telephone Encounter (Signed)
Per 07/30/14 OV: Patient Instructions       Stop Lotrel .   Increase AZOR 2 tabs daily .   Follow up with primary provider for blood pressure management .   Would avoid ACE inhibitors if possible due to cough .   Continue on Atrovent inhaler 2puffs Four times a day  .   [facility]   Follow up . In 4 weeks with Dr. Vassie LollAlva  And As needed    Please contact office for sooner follow up if symptoms do not improve or worsen or seek emergency care   Work on smoking .  --  LMOMTCB x1--which dosage is she on?

## 2014-08-04 NOTE — Telephone Encounter (Signed)
Spoke with pt. She is on azor 5-20. New RX has been sent in. Nothing further needed

## 2014-08-04 NOTE — Telephone Encounter (Signed)
Returned call, can be reached at [phone].

## 2014-08-07 ENCOUNTER — Other Ambulatory Visit: Payer: BLUE CROSS/BLUE SHIELD

## 2014-08-11 ENCOUNTER — Encounter: Payer: Self-pay | Admitting: Medical

## 2014-08-12 MED ORDER — PREDNISONE 20 MG PO TABS: ORAL_TABLET | ORAL | Status: DC

## 2014-08-19 ENCOUNTER — Encounter: Payer: Self-pay | Admitting: Medical

## 2014-08-19 ENCOUNTER — Ambulatory Visit (INDEPENDENT_AMBULATORY_CARE_PROVIDER_SITE_OTHER): Payer: BLUE CROSS/BLUE SHIELD | Admitting: Medical

## 2014-08-19 VITALS — BP 127/69 | HR 79 | Temp 98.4°F | Ht 69.5 in | Wt 315.0 lb

## 2014-08-19 DIAGNOSIS — J449 Chronic obstructive pulmonary disease, unspecified: Secondary | ICD-10-CM | POA: Diagnosis not present

## 2014-08-19 DIAGNOSIS — I10 Essential (primary) hypertension: Secondary | ICD-10-CM

## 2014-08-19 NOTE — Assessment & Plan Note (Signed)
Stable now. Stay on atrovent and albuterol(if needed). Has steroid available if has flares.   Discussed critiria to be seen in ED.  Follow up with us 2-3 wks after sees pulmonologist.

## 2014-08-19 NOTE — Progress Notes (Signed)
Subjective:    Patient ID: Rita FieldingSally J Jahr, female    DOB: Jul 01, 1955, 59 y.o.   MRN: [id number]  HPI  Pt in states she has not had any problems with breathin asince she took on hand prednisone if she where to get copd excacerbations. So she started 5 day pack on July 2nd. Got better quickly and done well since. She is checking her levels daily. Pt has her own 02 monitor and checks daily. In office with her machine 97%. We got 96%.  Day she had to take prednisone was at 85%. Then came up quickly.  Pt pft tests were rescheduled and she does not have date yet. Pt has follow up after the PFT.   Pt on atrovent daily. Has albuterol if she needs.   Review of Systems  Constitutional: Negative for fever, chills, diaphoresis, activity change and fatigue.  Respiratory: Negative for cough, chest tightness and shortness of breath.   Cardiovascular: Negative for chest pain, palpitations and leg swelling.  Gastrointestinal: Negative for nausea, vomiting and abdominal pain.  Musculoskeletal: Negative for neck pain and neck stiffness.  Neurological: Negative for dizziness, tremors, seizures, syncope, facial asymmetry, speech difficulty, weakness, light-headedness, numbness and headaches.  Psychiatric/Behavioral: Negative for behavioral problems, confusion and agitation. The patient is not nervous/anxious.     Past Medical History  Diagnosis Date  . Allergy   . Asthma   . Hypertension   . Hyperlipidemia   . Depression   . Anxiety   . COPD (chronic obstructive pulmonary disease)     History   Social History  . Marital Status: Widowed    Spouse Name: N/A  . Number of Children: N/A  . 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 Smoking status: Current Every Day Smoker -- 1.00 packs/day for 35 years    Types: Cigarettes  . Smokeless tobacco: Never Used  . Alcohol Use: No  . Drug Use: No  . Sexual Activity: Not on file    Other Topics Concern  . Not on file   Social History Narrative   Does not exercise.    Past Surgical History  Procedure Laterality Date  . Slipped capital femoral epiphysis pinning    . Hip pinning      3 each hip  . Foot surgery      Left    Family History  Problem Relation Age of Onset  . Hypertension Mother   . Hyperlipidemia Mother   . Hypertension Brother   . Asthma Brother     Allergies  Allergen Reactions  . Terbinafine Hcl Swelling  . Avelox [Moxifloxacin Hcl In Nacl]   . Montelukast Sodium Other (See Comments)    Insomnia  . Pravastatin Other (See Comments)    Myalgias on 20mg   . Zocor [Simvastatin - High Dose]     Current Outpatient Prescriptions on File Prior to Visit  Medication Sig Dispense Refill  . albuterol (PROVENTIL HFA;VENTOLIN HFA) 108 (90 BASE) MCG/ACT inhaler Inhale 1-2 puffs into the lungs every 4 (four) hours as needed for wheezing or shortness of breath. 1 Inhaler 11  . ALPRAZolam (XANAX) 0.5 MG tablet Take 1 tablet in morning and 1 tablet at bedtime for sleep. 60 tablet 2  . amLODipine-olmesartan (AZOR) 5-20 MG per tablet 2 tabs daily 60 tablet 0  . furosemide (LASIX) 40 MG tablet Take 1 tablet (40 mg total) by mouth daily. 30 tablet 11  .  gabapentin (NEURONTIN) 400 MG capsule Take 1 capsule at bedtime or as directed. 90 capsule 3  . ipratropium (ATROVENT HFA) 17 MCG/ACT inhaler Inhale 2 puffs into the lungs every 6 (six) hours as needed for wheezing. 1 Inhaler 12  . potassium chloride SA (K-DUR,KLOR-CON) 20 MEQ tablet Take 1 tablet (20 mEq total) by mouth daily. 30 tablet 11   No current facility-administered medications on file prior to visit.    BP 127/69 mmHg  Pulse 79  Temp(Src) 98.4 F (36.9 C) (Oral)  Ht 5' 9.5" (1.765 m)  Wt 315 lb (142.883 kg)  BMI 45.87 kg/m2  SpO2 96%       Objective:   Physical Exam  General  Mental Status - Alert. General Appearance - Well groomed. Not in acute distress.  Skin Rashes- No  Rashes.  HEENT Head- Normal. Ear Auditory Canal - Left- Normal. Right - Normal.Tympanic Membrane- Left- Normal. Right- Normal. Eye Sclera/Conjunctiva- Left- Normal. Right- Normal. Nose & Sinuses Nasal Mucosa- Left-  Not oggy or Congested. Right-  Not  boggy or Congested. Mouth & Throat Lips: Upper Lip- Normal: no dryness, cracking, pallor, cyanosis, or vesicular eruption. Lower Lip-Normal: no dryness, cracking, pallor, cyanosis or vesicular eruption. Buccal Mucosa- Bilateral- No Aphthous ulcers. Oropharynx- No Discharge or Erythema. Tonsils: Characteristics- Bilateral- No Erythema or Congestion. Size/Enlargement- Bilateral- No enlargement. Discharge- bilateral-None.  Neck Neck- Supple. No Masses.   Chest and Lung Exam Auscultation: Breath Sounds:- even and unlabored  Cardiovascular Auscultation:Rythm- Regular, rate and rhythm. Murmurs & Other Heart Sounds:Ausculatation of the heart reveal- No Murmurs.  Lymphatic Head & Neck General Head & Neck Lymphatics: Bilateral: Description- No Localized lymphadenopathy.       Assessment & Plan:

## 2014-08-19 NOTE — Patient Instructions (Addendum)
COPD pfts pending  Stable now. Stay on atrovent and albuterol(if needed). Has steroid available if has flares.   Discussed critiria to be seen in ED.  Follow up with us 2-3 wks after sees pulmonologist.  HTN (hypertension) Pt bp level good today. Pt states sometimes fluctuates at times. High can be 163/81. /but usually 120/70. Keep record of bp readings. If getting over 140/90 would need to consider some changes.   Can schedule of cpe. If you do so then early am and fasting.

## 2014-08-19 NOTE — Assessment & Plan Note (Signed)
Pt bp level good today. Pt states sometimes fluctuates at times. High can be 163/81. /but usually 120/70. Keep record of bp readings. If getting over 140/90 would need to consider some changes.

## 2014-08-19 NOTE — Progress Notes (Signed)
Pre visit review using our clinic review tool, if applicable. No additional management support is needed unless otherwise documented below in the visit note. 

## 2014-08-30 ENCOUNTER — Other Ambulatory Visit: Payer: Self-pay | Admitting: Family Medicine

## 2014-08-31 ENCOUNTER — Encounter: Payer: Self-pay | Admitting: Adult Health

## 2014-08-31 MED ORDER — AMLODIPINE-OLMESARTAN 5-20 MG PO TABS: ORAL_TABLET | ORAL | Status: DC

## 2014-09-01 ENCOUNTER — Encounter: Payer: Self-pay | Admitting: Adult Health

## 2014-09-01 DIAGNOSIS — J449 Chronic obstructive pulmonary disease, unspecified: Secondary | ICD-10-CM

## 2014-09-01 NOTE — Telephone Encounter (Signed)
Please advise MW thanks 

## 2014-09-02 NOTE — Telephone Encounter (Signed)
Per Dr. Penni Homans with me to stop all 02 effective immediately. I don't know how to help her with cost issues since we did not participate in decision to start it.   Order was placed to [facility] to d/c O2. Pt was notifed by email.  Nothing further needed

## 2014-09-03 ENCOUNTER — Ambulatory Visit: Payer: BLUE CROSS/BLUE SHIELD | Admitting: Pulmonary Disease

## 2014-09-24 ENCOUNTER — Ambulatory Visit: Payer: BLUE CROSS/BLUE SHIELD | Admitting: Adult Health

## 2014-10-06 ENCOUNTER — Other Ambulatory Visit: Payer: Self-pay | Admitting: Family Medicine

## 2014-10-07 NOTE — Telephone Encounter (Signed)
Pt receives her primary care at [facility] and her PCP is Mr. Willeen Cass - needs to receive chronic medications and controlled meds such as the alprozolam through them.  If she has an acute issue that her PCP cannot address than she is always welcome to come into 102.

## 2014-10-29 ENCOUNTER — Ambulatory Visit (INDEPENDENT_AMBULATORY_CARE_PROVIDER_SITE_OTHER): Payer: BLUE CROSS/BLUE SHIELD | Admitting: Medical

## 2014-10-29 ENCOUNTER — Encounter: Payer: Self-pay | Admitting: Medical

## 2014-10-29 VITALS — BP 130/86 | HR 88 | Temp 97.8°F | Ht 69.5 in | Wt 303.0 lb

## 2014-10-29 DIAGNOSIS — Z720 Tobacco use: Secondary | ICD-10-CM | POA: Diagnosis not present

## 2014-10-29 DIAGNOSIS — F17200 Nicotine dependence, unspecified, uncomplicated: Secondary | ICD-10-CM

## 2014-10-29 DIAGNOSIS — R600 Localized edema: Secondary | ICD-10-CM | POA: Diagnosis not present

## 2014-10-29 DIAGNOSIS — E785 Hyperlipidemia, unspecified: Secondary | ICD-10-CM | POA: Diagnosis not present

## 2014-10-29 DIAGNOSIS — J449 Chronic obstructive pulmonary disease, unspecified: Secondary | ICD-10-CM | POA: Diagnosis not present

## 2014-10-29 DIAGNOSIS — Z23 Encounter for immunization: Secondary | ICD-10-CM

## 2014-10-29 DIAGNOSIS — E876 Hypokalemia: Secondary | ICD-10-CM

## 2014-10-29 LAB — COMPREHENSIVE METABOLIC PANEL
ALBUMIN: 3.7 g/dL (ref 3.5–5.2)
ALK PHOS: 90 U/L (ref 39–117)
ALT: 10 U/L (ref 0–35)
AST: 13 U/L (ref 0–37)
BILIRUBIN TOTAL: 0.5 mg/dL (ref 0.2–1.2)
BUN: 8 mg/dL (ref 6–23)
CO2: 33 mEq/L — ABNORMAL HIGH (ref 19–32)
CREATININE: 0.59 mg/dL (ref 0.40–1.20)
Calcium: 8.8 mg/dL (ref 8.4–10.5)
Chloride: 102 mEq/L (ref 96–112)
GFR: 110.13 mL/min (ref >60.00)
GLUCOSE: 90 mg/dL (ref 70–99)
POTASSIUM: 3 meq/L — AB (ref 3.5–5.1)
SODIUM: 142 meq/L (ref 135–145)
TOTAL PROTEIN: 7.1 g/dL (ref 6.0–8.3)

## 2014-10-29 MED ORDER — BUPROPION HCL ER (SR) 150 MG PO TB12: ORAL_TABLET | ORAL | Status: DC

## 2014-10-29 MED ORDER — ALPRAZOLAM 0.5 MG PO TABS: ORAL_TABLET | ORAL | Status: DC

## 2014-10-29 MED ORDER — POTASSIUM CHLORIDE ER 10 MEQ PO TBCR: 10.0000 meq | EXTENDED_RELEASE_TABLET | Freq: Every day | ORAL | Status: DC

## 2014-10-29 NOTE — Addendum Note (Signed)
Addended by: Neldon Labella on: 10/29/2014 03:32 PM   Modules accepted: Orders

## 2014-10-29 NOTE — Patient Instructions (Addendum)
Continue the atrovent and albuterol inhalers.(doing well)  Will rx wellbutrin for smoking cessation. Rx advisement. Explained how to use.  For pedal edema will get cxr, bnp and cmp. May up diuretic dose or maybe recommend ted hose stocking when studies are in.  Will put future fasting lipid panel in computer.  Pt will get flu vaccine today.  Will give rx of xanax. Explained to pt use of med and contract required for long term use(will discuss on follow up and get contract signed). Pt prior pcp retired  Follow up in 2 months.

## 2014-10-29 NOTE — Progress Notes (Signed)
Subjective:    Patient ID: Rita Watkins, female    DOB: Apr 27, 1955, 59 y.o.   MRN: [id number]  HPI   Pt in states she is doing well with her breathing. She is on her inhalers.  Pt is on atrovent daily. Also uses albuterol. Pt has been stable all summer. And never had to take the back up prednisone that I wrote. Pt also has pulse ox. When she checks her 02 sat lowest was 92%.  Today is 98%.  Pt states when in the hospital she was placed on lasix. Pt states she has very swollen lower extremity since summer This has been going on since dc from hospital. She reduces her salt intake. Pt weight has been decreasing. No sob lying flat on her back. Eating better and dieting.  Pt is not fasting. She has hx of hyperlipidemia. But she is not fasting.  At end noted needs her xanax. She has hx of anxiety. Hx of panic attacks. Pt was on 0.5 mg bid for a year and 5 months. Prior pcp retired.    Review of Systems  Constitutional: Negative for fever, chills, diaphoresis, activity change and fatigue.  Respiratory: Negative for cough, chest tightness, shortness of breath and wheezing.   Cardiovascular: Negative for chest pain, palpitations and leg swelling.  Gastrointestinal: Negative for nausea, vomiting and abdominal pain.  Musculoskeletal: Negative for neck pain and neck stiffness.       Bilateral lower ext pedal edema.  Neurological: Negative for dizziness, tremors, seizures, syncope, facial asymmetry, speech difficulty, weakness, light-headedness, numbness and headaches.  Psychiatric/Behavioral: Negative for behavioral problems, confusion and agitation. The patient is nervous/anxious.    Past Medical History  Diagnosis Date  . Allergy   . Asthma   . Hypertension   . Hyperlipidemia   . Depression   . Anxiety   . COPD (chronic obstructive pulmonary disease)     Social History   Social History  . Marital Status: Widowed    Spouse Name: N/A  . Number of Children: N/A  . 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 Smoking status: Current Every Day Smoker -- 1.00 packs/day for 35 years    Types: Cigarettes  . Smokeless tobacco: Never Used  . Alcohol Use: No  . Drug Use: No  . Sexual Activity: Not on file   Other Topics Concern  . Not on file   Social History Narrative   Does not exercise.    Past Surgical History  Procedure Laterality Date  . Slipped capital femoral epiphysis pinning    . Hip pinning      3 each hip  . Foot surgery      Left    Family History  Problem Relation Age of Onset  . Hypertension Mother   . Hyperlipidemia Mother   . Hypertension Brother   . Asthma Brother     Allergies  Allergen Reactions  . Terbinafine Hcl Swelling  . Avelox [Moxifloxacin Hcl In Nacl]   . Montelukast Sodium Other (See Comments)    Insomnia  . Pravastatin Other (See Comments)    Myalgias on   . Zocor [Simvastatin - High Dose]     Current Outpatient Prescriptions on File Prior to Visit  Medication Sig Dispense Refill  . albuterol (PROVENTIL HFA;VENTOLIN HFA) 108 (90 BASE) MCG/ACT inhaler Inhale 1-2 puffs into the lungs every 4 (four) hours as needed for wheezing or  shortness of breath. 1 Inhaler 11  . amLODipine-benazepril (LOTREL) 10-40 MG per capsule TAKE 1 CAPSULE DAILY 90 capsule 0  . amLODipine-olmesartan (AZOR) 5-20 MG per tablet 2 tabs daily 180 tablet 1  . furosemide (LASIX) 40 MG tablet Take 1 tablet (40 mg total) by mouth daily. 30 tablet 11  . gabapentin (NEURONTIN) 400 MG capsule Take 1 capsule at bedtime or as directed. 90 capsule 3  . ipratropium (ATROVENT HFA) 17 MCG/ACT inhaler Inhale 2 puffs into the lungs every 6 (six) hours as needed for wheezing. 1 Inhaler 12  . potassium chloride SA (K-DUR,KLOR-CON) 20 MEQ tablet Take 1 tablet (20 mEq total) by mouth daily. 30 tablet 11  . ALPRAZolam (XANAX) 0.5 MG tablet Take 1 tablet in morning and 1 tablet at bedtime for sleep.  (Patient not taking: Reported on 10/29/2014) 60 tablet 2   No current facility-administered medications on file prior to visit.    BP 130/86 mmHg  Pulse 88  Temp(Src) 97.8 F (36.6 C) (Oral)  Ht 5' 9.5" (1.765 m)  Wt 303 lb (137.44 kg)  BMI 44.12 kg/m2  SpO2 98%       Objective:   Physical Exam  General Mental Status- Alert. General Appearance- Not in acute distress.   Skin General: Color- Normal Color. Moisture- Normal Moisture.  Neck Carotid Arteries- Normal color. Moisture- Normal Moisture. No carotid bruits. No JVD.  Chest and Lung Exam Auscultation: Breath Sounds:-Normal. CTA.  Cardiovascular Auscultation:Rythm- Regular. Murmurs & Other Heart Sounds:Auscultation of the heart reveals- No Murmurs.  Abdomen Inspection:-Inspeection Normal. Palpation/Percussion:Note:No mass. Palpation and Percussion of the abdomen reveal- Non Tender, Non Distended + BS, no rebound or guarding.   Neurologic Cranial Nerve exam:- CN III-XII intact(No nystagmus), symmetric smile. Strength:- 5/5 equal and symmetric strength both upper and lower extremities.  Lower ext  Ext- pedal on both sides mid feet to ankles. Both sides and appears severe. But only 1+ edema. No edema in calves. Negative homans signs.    Assessment & Plan:  Continue the atrovent and albuterol inhalers.doing well  Will rx wellbutrin for smoking cessation. Rx advisement. Explained how to use.  For pedal edema will get cxr, bnp and cmp. May up diuretic dose or maybe recommend ted hose stocking when study results in.  Will put future fasting lipid panel in computer.  Pt will get flu vaccine today.  Will give rx of xanax. Explained to pt use of med and contract required for long term use. Pt prior pcp retired  Follow up in 2 months.

## 2014-10-29 NOTE — Progress Notes (Signed)
Pre visit review using our clinic review tool, if applicable. No additional management support is needed unless otherwise documented below in the visit note. 

## 2014-10-30 LAB — PRO B NATRIURETIC PEPTIDE: Pro B Natriuretic peptide (BNP): 452.6 pg/mL — ABNORMAL HIGH (ref <126)

## 2014-11-13 ENCOUNTER — Other Ambulatory Visit (INDEPENDENT_AMBULATORY_CARE_PROVIDER_SITE_OTHER): Payer: BLUE CROSS/BLUE SHIELD

## 2014-11-13 DIAGNOSIS — E785 Hyperlipidemia, unspecified: Secondary | ICD-10-CM

## 2014-11-13 DIAGNOSIS — E876 Hypokalemia: Secondary | ICD-10-CM | POA: Diagnosis not present

## 2014-11-13 LAB — COMPREHENSIVE METABOLIC PANEL
ALBUMIN: 3.7 g/dL (ref 3.5–5.2)
ALT: 10 U/L (ref 0–35)
AST: 14 U/L (ref 0–37)
Alkaline Phosphatase: 105 U/L (ref 39–117)
BILIRUBIN TOTAL: 0.6 mg/dL (ref 0.2–1.2)
BUN: 9 mg/dL (ref 6–23)
CALCIUM: 9.6 mg/dL (ref 8.4–10.5)
CO2: 32 meq/L (ref 19–32)
CREATININE: 0.68 mg/dL (ref 0.40–1.20)
Chloride: 105 mEq/L (ref 96–112)
GFR: 93.47 mL/min (ref >60.00)
Glucose, Bld: 102 mg/dL — ABNORMAL HIGH (ref 70–99)
Potassium: 4.4 mEq/L (ref 3.5–5.1)
SODIUM: 148 meq/L — AB (ref 135–145)
Total Protein: 7.4 g/dL (ref 6.0–8.3)

## 2014-11-13 LAB — LIPID PANEL
Cholesterol: 219 mg/dL — ABNORMAL HIGH (ref 0–200)
HDL: 29.3 mg/dL — ABNORMAL LOW (ref >39.00)
NONHDL: 189.47
TRIGLYCERIDES: 310 mg/dL — AB (ref 0.0–149.0)
Total CHOL/HDL Ratio: 7
VLDL: 62 mg/dL — ABNORMAL HIGH (ref 0.0–40.0)

## 2014-11-13 LAB — LDL CHOLESTEROL, DIRECT: Direct LDL: 127 mg/dL

## 2014-11-15 ENCOUNTER — Telehealth: Payer: Self-pay | Admitting: Medical

## 2014-11-15 MED ORDER — FENOFIBRATE 50 MG PO CAPS: 50.0000 mg | ORAL_CAPSULE | Freq: Once | ORAL | Status: DC

## 2014-11-15 NOTE — Telephone Encounter (Signed)
rx fenofibrate

## 2014-12-25 ENCOUNTER — Encounter (HOSPITAL_BASED_OUTPATIENT_CLINIC_OR_DEPARTMENT_OTHER): Payer: Self-pay | Admitting: *Deleted

## 2014-12-25 ENCOUNTER — Emergency Department (HOSPITAL_BASED_OUTPATIENT_CLINIC_OR_DEPARTMENT_OTHER)
Admission: EM | Admit: 2014-12-25 | Discharge: 2014-12-25 | Disposition: A | Discharge status: Home / Self Care | Payer: BLUE CROSS/BLUE SHIELD | Attending: Emergency Medicine | Admitting: Emergency Medicine

## 2014-12-25 ENCOUNTER — Emergency Department (HOSPITAL_BASED_OUTPATIENT_CLINIC_OR_DEPARTMENT_OTHER): Payer: BLUE CROSS/BLUE SHIELD

## 2014-12-25 DIAGNOSIS — J441 Chronic obstructive pulmonary disease with (acute) exacerbation: Secondary | ICD-10-CM | POA: Insufficient documentation

## 2014-12-25 DIAGNOSIS — I10 Essential (primary) hypertension: Secondary | ICD-10-CM | POA: Diagnosis not present

## 2014-12-25 DIAGNOSIS — E785 Hyperlipidemia, unspecified: Secondary | ICD-10-CM | POA: Diagnosis not present

## 2014-12-25 DIAGNOSIS — F17210 Nicotine dependence, cigarettes, uncomplicated: Secondary | ICD-10-CM | POA: Diagnosis not present

## 2014-12-25 DIAGNOSIS — R0602 Shortness of breath: Secondary | ICD-10-CM | POA: Diagnosis present

## 2014-12-25 DIAGNOSIS — F419 Anxiety disorder, unspecified: Secondary | ICD-10-CM | POA: Insufficient documentation

## 2014-12-25 DIAGNOSIS — Z79899 Other long term (current) drug therapy: Secondary | ICD-10-CM | POA: Diagnosis not present

## 2014-12-25 DIAGNOSIS — J40 Bronchitis, not specified as acute or chronic: Secondary | ICD-10-CM

## 2014-12-25 DIAGNOSIS — F329 Major depressive disorder, single episode, unspecified: Secondary | ICD-10-CM | POA: Insufficient documentation

## 2014-12-25 MED ORDER — IPRATROPIUM-ALBUTEROL 0.5-2.5 (3) MG/3ML IN SOLN
3.0000 mL | Freq: Once | RESPIRATORY_TRACT | Status: AC
  Administered 2014-12-25: 3 mL via RESPIRATORY_TRACT
  Filled 2014-12-25: qty 3

## 2014-12-25 MED ORDER — ALBUTEROL SULFATE (2.5 MG/3ML) 0.083% IN NEBU
2.5000 mg | INHALATION_SOLUTION | Freq: Once | RESPIRATORY_TRACT | Status: AC
  Administered 2014-12-25: 2.5 mg via RESPIRATORY_TRACT
  Filled 2014-12-25: qty 3

## 2014-12-25 MED ORDER — PREDNISONE 20 MG PO TABS: 40.0000 mg | ORAL_TABLET | Freq: Every day | ORAL | Status: DC

## 2014-12-25 MED ORDER — ALBUTEROL SULFATE (2.5 MG/3ML) 0.083% IN NEBU: 5.0000 mg | INHALATION_SOLUTION | Freq: Once | RESPIRATORY_TRACT | Status: DC

## 2014-12-25 MED ORDER — PREDNISONE 50 MG PO TABS
50.0000 mg | ORAL_TABLET | Freq: Every day | ORAL | Status: DC
  Administered 2014-12-25: 50 mg via ORAL
  Filled 2014-12-25: qty 1

## 2014-12-25 NOTE — ED Notes (Signed)
Patient transported to X-ray 

## 2014-12-25 NOTE — ED Notes (Signed)
Pt reports cold sx since Tuesday- increasing SOB and "chest heavy" since last evening- Pt reports dyspnea with exertion- O2 sats 89-90% on room air

## 2014-12-25 NOTE — ED Notes (Signed)
Patient ambulated approx. 7680ft SpO2 90-94%, HR 92-97, RR 20-28.  +DOE, congested NPC.  Returned to room per patient request.

## 2015-01-09 NOTE — ED Provider Notes (Signed)
CSN: [id number]     Arrival date & time 12/25/14  1100 History   First MD Initiated Contact with Patient 12/25/14 1154     Chief Complaint  Patient presents with  . Shortness of Breath     (Consider location/radiation/quality/duration/timing/severity/associated sxs/prior Treatment) HPI   59 year old female with cough and shortness of breath. Since began on Tuesday and progressively worsening since then. Cough is nonproductive. No fevers or chills. She feels like her chest is "heavy from all the coughing" she is doing but denies actual pain. No unusual leg pain or swelling. Has been compliant with her home medications.  Past Medical History  Diagnosis Date  . Allergy   . Asthma   . Hypertension   . Hyperlipidemia   . Depression   . Anxiety   . COPD (chronic obstructive pulmonary disease) [facility])    Past Surgical History  Procedure Laterality Date  . Slipped capital femoral epiphysis pinning    . Hip pinning      3 each hip  . Foot surgery      Left   Family History  Problem Relation Age of Onset  . Hypertension Mother   . Hyperlipidemia Mother   . Hypertension Brother   . Asthma Brother    Social History  Substance Use Topics  . Smoking status: Current Every Day Smoker -- 1.00 packs/day for 35 years    Types: Cigarettes  . Smokeless tobacco: Never Used  . Alcohol Use: No   OB History    No data available     Review of Systems  All systems reviewed and negative, other than as noted in HPI.   Allergies  Terbinafine hcl; Avelox; Montelukast sodium; Pravastatin; and Zocor  Home Medications   Prior to Admission medications   Medication Sig Start Date End Date Taking? Authorizing Provider  albuterol (PROVENTIL HFA;VENTOLIN HFA) 108 (90 BASE) MCG/ACT inhaler Inhale 1-2 puffs into the lungs every 4 (four) hours as needed for wheezing or shortness of breath. 06/05/14  Yes Sherren MochaEva N Shaw, MD  ALPRAZolam Prudy Feeler(XANAX) 0.5 MG tablet Take 1 tablet in morning and 1 tablet at  bedtime for sleep. 10/29/14  Yes Esperanza RichtersEdward Saguier, PA-C  amLODipine-olmesartan (AZOR) 5-20 MG per tablet 2 tabs daily 08/31/14  Yes Tammy S Parrett, NP  Fenofibrate 50 MG CAPS Take 1 capsule (50 mg total) by mouth once. 11/15/14  Yes Edward Saguier, PA-C  furosemide (LASIX) 40 MG tablet Take 1 tablet (40 mg total) by mouth daily. 07/15/14  Yes Nyoka CowdenMichael B Wert, MD  ipratropium (ATROVENT HFA) 17 MCG/ACT inhaler Inhale 2 puffs into the lungs every 6 (six) hours as needed for wheezing. 06/27/14  Yes Arby BarretteMarcy Pfeiffer, MD  amLODipine-benazepril (LOTREL) 10-40 MG per capsule TAKE 1 CAPSULE DAILY 09/01/14   Morrell RiddleSarah L Weber, PA-C  buPROPion [facility](WELLBUTRIN SR) 150 MG 12 hr tablet 1 tab po q day for 3 days. Then 1 tab po bid 10/29/14   Esperanza RichtersEdward Saguier, PA-C  gabapentin (NEURONTIN) 400 MG capsule Take 1 capsule at bedtime or as directed. 01/16/14   Raelyn Ensignodd McVeigh, PA  potassium chloride (K-DUR) 10 MEQ tablet Take 1 tablet (10 mEq total) by mouth daily. 10/29/14   Ramon DredgeEdward Saguier, PA-C  potassium chloride SA (K-DUR,KLOR-CON) 20 MEQ tablet Take 1 tablet (20 mEq total) by mouth daily. 07/15/14   Nyoka CowdenMichael B Wert, MD  predniSONE (DELTASONE) 20 MG tablet Take 2 tablets (40 mg total) by mouth daily. 12/25/14   Raeford RazorStephen Sera Hitsman, MD   BP 144/66 mmHg  Pulse  83  Temp(Src) 98 F (36.7 C) (Oral)  Resp 18  Ht  (1.753 m)  Wt 295 lb (133.811 kg)  BMI 43.54 kg/m2  SpO2 92% Physical Exam  Constitutional: She appears well-developed and well-nourished. No distress.  HENT:  Head: Normocephalic and atraumatic.  Eyes: Conjunctivae are normal. Right eye exhibits no discharge. Left eye exhibits no discharge.  Neck: Neck supple.  Cardiovascular: Normal rate, regular rhythm and normal heart sounds.  Exam reveals no gallop and no friction rub.   No murmur heard. Pulmonary/Chest: Effort normal. No respiratory distress. She has wheezes.  Abdominal: Soft. She exhibits no distension. There is no tenderness.  Musculoskeletal: She exhibits no tenderness.   Lower extremities symmetric as compared to each other. No calf tenderness. Negative Homan's. No palpable cords.   Neurological: She is alert.  Skin: Skin is warm and dry.  Psychiatric: She has a normal mood and affect. Her behavior is normal. Thought content normal.  Nursing note and vitals reviewed.   ED Course  Procedures (including critical care time) Labs Review Labs Reviewed - No data to display  Imaging Review No results found. I have personally reviewed and evaluated these images and lab results as part of my medical decision-making.   EKG Interpretation   Date/Time:  Friday December 25 2014 11:16:49 EST Ventricular Rate:  83 PR Interval:  150 QRS Duration: 98 QT Interval:  412 QTC Calculation: 484 R Axis:   33 Text Interpretation:  Normal sinus rhythm Normal ECG ED PHYSICIAN  INTERPRETATION AVAILABLE IN [facility] Confirmed by TEST, Record  ([id number]) on 12/26/2014 7:59:44 AM      MDM   Final diagnoses:  Bronchitis    58 year old female with dyspnea. Wheezing on exam. Improved from arrival. Chest x-ray without any significant abnormality. Still some mild hypoxemia after treatment with the emergency room. He states that she would like to go home and feels comfortable doing so at this time. She reports adequate supplies several medications. She will also be provided with prescription for continued steroids.    Raeford Razor, MD 01/09/15 2137

## 2015-01-13 ENCOUNTER — Telehealth: Payer: Self-pay

## 2015-01-13 NOTE — Telephone Encounter (Signed)
Left message for patient to return call regarding pre- visit information.  

## 2015-01-14 ENCOUNTER — Ambulatory Visit: Payer: BLUE CROSS/BLUE SHIELD | Admitting: Family Medicine

## 2015-01-14 ENCOUNTER — Telehealth: Payer: Self-pay | Admitting: Family Medicine

## 2015-01-15 NOTE — Telephone Encounter (Signed)
Pt was no show for new pt appt 01/14/15 2:30pm, pt has had several prior acute appts with Ramon DredgeEdward, charge or no charge?

## 2015-01-16 NOTE — Telephone Encounter (Signed)
No charge but she will need an appt with me at some point if she wants to be my patient still.

## 2015-02-17 ENCOUNTER — Other Ambulatory Visit: Payer: Self-pay | Admitting: Adult Health

## 2015-03-05 ENCOUNTER — Encounter (HOSPITAL_BASED_OUTPATIENT_CLINIC_OR_DEPARTMENT_OTHER): Payer: Self-pay | Admitting: *Deleted

## 2015-03-05 ENCOUNTER — Emergency Department (HOSPITAL_BASED_OUTPATIENT_CLINIC_OR_DEPARTMENT_OTHER): Payer: BLUE CROSS/BLUE SHIELD

## 2015-03-05 ENCOUNTER — Emergency Department (HOSPITAL_BASED_OUTPATIENT_CLINIC_OR_DEPARTMENT_OTHER)
Admission: EM | Admit: 2015-03-05 | Discharge: 2015-03-05 | Disposition: A | Discharge status: Home / Self Care | Payer: BLUE CROSS/BLUE SHIELD | Attending: Emergency Medicine | Admitting: Emergency Medicine

## 2015-03-05 DIAGNOSIS — I10 Essential (primary) hypertension: Secondary | ICD-10-CM | POA: Insufficient documentation

## 2015-03-05 DIAGNOSIS — Y9289 Other specified places as the place of occurrence of the external cause: Secondary | ICD-10-CM | POA: Insufficient documentation

## 2015-03-05 DIAGNOSIS — Y998 Other external cause status: Secondary | ICD-10-CM | POA: Insufficient documentation

## 2015-03-05 DIAGNOSIS — S8992XA Unspecified injury of left lower leg, initial encounter: Secondary | ICD-10-CM | POA: Diagnosis present

## 2015-03-05 DIAGNOSIS — S8392XA Sprain of unspecified site of left knee, initial encounter: Secondary | ICD-10-CM | POA: Insufficient documentation

## 2015-03-05 DIAGNOSIS — Z8639 Personal history of other endocrine, nutritional and metabolic disease: Secondary | ICD-10-CM | POA: Diagnosis not present

## 2015-03-05 DIAGNOSIS — Z79899 Other long term (current) drug therapy: Secondary | ICD-10-CM | POA: Insufficient documentation

## 2015-03-05 DIAGNOSIS — J449 Chronic obstructive pulmonary disease, unspecified: Secondary | ICD-10-CM | POA: Insufficient documentation

## 2015-03-05 DIAGNOSIS — Y9301 Activity, walking, marching and hiking: Secondary | ICD-10-CM | POA: Diagnosis not present

## 2015-03-05 DIAGNOSIS — W1849XA Other slipping, tripping and stumbling without falling, initial encounter: Secondary | ICD-10-CM | POA: Diagnosis not present

## 2015-03-05 DIAGNOSIS — F329 Major depressive disorder, single episode, unspecified: Secondary | ICD-10-CM | POA: Insufficient documentation

## 2015-03-05 DIAGNOSIS — Z7952 Long term (current) use of systemic steroids: Secondary | ICD-10-CM | POA: Insufficient documentation

## 2015-03-05 DIAGNOSIS — F419 Anxiety disorder, unspecified: Secondary | ICD-10-CM | POA: Insufficient documentation

## 2015-03-05 DIAGNOSIS — F17210 Nicotine dependence, cigarettes, uncomplicated: Secondary | ICD-10-CM | POA: Insufficient documentation

## 2015-03-05 MED ORDER — HYDROCODONE-ACETAMINOPHEN 5-325 MG PO TABS: 1.0000 | ORAL_TABLET | Freq: Three times a day (TID) | ORAL | Status: DC | PRN

## 2015-03-05 MED FILL — HYDROCODON-APAP 5-325: 5-325 | 4 days supply | Qty: 10 | Fill #0

## 2015-03-05 NOTE — Discharge Instructions (Signed)

## 2015-03-05 NOTE — ED Provider Notes (Signed)
CSN: [id number]     Arrival date & time 03/05/15  1152 History   First MD Initiated Contact with Patient 03/05/15 1245     Chief Complaint  Patient presents with  . Knee Pain     Patient is a 60 y.o. female presenting with knee pain. The history is provided by the patient. No language interpreter was used.  Knee Pain  Rita Watkins is a 60 y.o. female who presents to the Emergency Department complaining of knee injury. 6 days ago she was walking on grass with low heels and the heel sunk into the grass and her knee rotated to the side. She felt cracking at the time. She expressed immediate pain and swelling. Since that time she has tried Tylenol, ibuprofen, Aleve, Excedrin without relief. She has pain with range of motion and with ambulation. No fevers, shortness of breath. Sxs are moderate, constant worsening.  Past Medical History  Diagnosis Date  . Allergy   . Asthma   . Hypertension   . Hyperlipidemia   . Depression   . Anxiety   . COPD (chronic obstructive pulmonary disease) [facility])    Past Surgical History  Procedure Laterality Date  . Slipped capital femoral epiphysis pinning    . Hip pinning      3 each hip  . Foot surgery      Left   Family History  Problem Relation Age of Onset  . Hypertension Mother   . Hyperlipidemia Mother   . Hypertension Brother   . Asthma Brother    Social History  Substance Use Topics  . Smoking status: Current Every Day Smoker -- 1.00 packs/day for 35 years    Types: Cigarettes  . Smokeless tobacco: Never Used  . Alcohol Use: None   OB History    No data available     Review of Systems  All other systems reviewed and are negative.     Allergies  Terbinafine hcl; Avelox; Montelukast sodium; Pravastatin; and Zocor  Home Medications   Prior to Admission medications   Medication Sig Start Date End Date Taking? Authorizing Provider  albuterol (PROVENTIL HFA;VENTOLIN HFA) 108 (90 BASE) MCG/ACT inhaler Inhale 1-2 puffs into the  lungs every 4 (four) hours as needed for wheezing or shortness of breath. 06/05/14   Sherren Mocha, MD  ALPRAZolam Prudy Feeler) 0.5 MG tablet Take 1 tablet in morning and 1 tablet at bedtime for sleep. 10/29/14   Esperanza Richters, PA-C  amLODipine-benazepril (LOTREL) 10-40 MG per capsule TAKE 1 CAPSULE DAILY 09/01/14   Morrell Riddle, PA-C  amLODipine-olmesartan (AZOR) 5-20 MG per tablet 2 tabs daily 08/31/14   Tammy S Parrett, NP  buPROPion (WELLBUTRIN SR) 150 MG 12 hr tablet 1 tab po q day for 3 days. Then 1 tab po bid 10/29/14   Esperanza Richters, PA-C  Fenofibrate 50 MG CAPS Take 1 capsule (50 mg total) by mouth once. 11/15/14   Ramon Dredge Saguier, PA-C  furosemide (LASIX) 40 MG tablet Take 1 tablet (40 mg total) by mouth daily. 07/15/14   Nyoka Cowden, MD  gabapentin (NEURONTIN) 400 MG capsule Take 1 capsule at bedtime or as directed. 01/16/14   Raelyn Ensign, PA  HYDROcodone-acetaminophen (NORCO/VICODIN) 5-325 MG tablet Take 1 tablet by mouth every 8 (eight) hours as needed for severe pain. 03/05/15   Tilden Fossa, MD  ipratropium (ATROVENT HFA) 17 MCG/ACT inhaler Inhale 2 puffs into the lungs every 6 (six) hours as needed for wheezing. 06/27/14   Arby Barrette, MD  potassium chloride (K-DUR) 10 MEQ tablet Take 1 tablet (10 mEq total) by mouth daily. 10/29/14   Ramon Dredge Saguier, PA-C  potassium chloride SA (K-DUR,KLOR-CON) 20 MEQ tablet Take 1 tablet (20 mEq total) by mouth daily. 07/15/14   Nyoka Cowden, MD  predniSONE (DELTASONE) 20 MG tablet Take 2 tablets (40 mg total) by mouth daily. 12/25/14   Raeford Razor, MD   BP 142/119 mmHg  Pulse 81  Temp(Src) 98 F (36.7 C) (Oral)  Resp 20  Ht  (1.753 m)  Wt 295 lb (133.811 kg)  BMI 43.54 kg/m2  SpO2 97% Physical Exam  Constitutional: She is oriented to person, place, and time. She appears well-developed and well-nourished. No distress.  HENT:  Head: Normocephalic and atraumatic.  Cardiovascular: Normal rate.   Pulmonary/Chest: Effort normal. No respiratory  distress.  Musculoskeletal:  Obese lower extremities with 2+ DP pulses bilaterally. There is diffuse tenderness to the anterior, lateral, medial knee. Patient has pain with extension and flexion of the knee but she is able to range the knee. There is some swelling noted about the knee without any erythema. Unable to palpate an effusion.  Neurological: She is alert and oriented to person, place, and time.  Skin: Skin is warm and dry.  Psychiatric: She has a normal mood and affect. Her behavior is normal.  Nursing note and vitals reviewed.   ED Course  Procedures (including critical care time) Labs Review Labs Reviewed - No data to display  Imaging Review Dg Knee Complete 4 Views Left  03/05/2015  CLINICAL DATA:  Left knee pain starting Saturday EXAM: LEFT KNEE - COMPLETE 4+ VIEW COMPARISON:  None. FINDINGS: Four views of the left knee submitted. No acute fracture or subluxation. Significant narrowing of medial joint compartment. There is diffuse osteopenia. Significant narrowing of patellofemoral joint space. Spurring of patella. Spurring of medial and lateral tibial plateau. Small joint effusion. IMPRESSION: No acute fracture or subluxation. Significant osteoarthritic changes as described above. Electronically Signed   By: Natasha Mead M.D.   On: 03/05/2015 12:37   I have personally reviewed and evaluated these images and lab results as part of my medical decision-making.   EKG Interpretation None      MDM   Final diagnoses:  Knee sprain, left, initial encounter   patient here for evaluation of left knee pain following a twisting injury 6 days ago. Concern for ligamentous injury. Treating with knee sleeve, pain control, orthopedics follow-up. Patient declines crutches as she would find this difficult for ambulation. She will obtain a walker or a 4 point cane from the pharmacy at discharge.    Tilden Fossa, MD 03/05/15 1320

## 2015-03-05 NOTE — ED Notes (Signed)
MD at bedside. 

## 2015-03-05 NOTE — ED Notes (Signed)
Pain in her left knee. She slipped, but did not fall, on wet ground and felt a crunch.

## 2015-03-05 NOTE — ED Notes (Signed)
Pt given d/c instructions as per chart. Verbalizes understanding. No questions. Narcotic prec given and pt taken to pharmacy by w/c.

## 2015-03-10 ENCOUNTER — Encounter: Payer: Self-pay | Admitting: Family Medicine

## 2015-03-10 ENCOUNTER — Ambulatory Visit (INDEPENDENT_AMBULATORY_CARE_PROVIDER_SITE_OTHER): Payer: BLUE CROSS/BLUE SHIELD | Admitting: Family Medicine

## 2015-03-10 VITALS — BP 144/80 | HR 76 | Ht 69.0 in | Wt 290.0 lb

## 2015-03-10 DIAGNOSIS — M25562 Pain in left knee: Secondary | ICD-10-CM

## 2015-03-10 MED ORDER — DICLOFENAC SODIUM 75 MG PO TBEC: 75.0000 mg | DELAYED_RELEASE_TABLET | Freq: Two times a day (BID) | ORAL | Status: DC

## 2015-03-10 MED ORDER — HYDROCODONE-ACETAMINOPHEN 5-325 MG PO TABS: 1.0000 | ORAL_TABLET | Freq: Four times a day (QID) | ORAL | Status: DC | PRN

## 2015-03-10 MED FILL — HYDROCODON-APAP 5-325: 5-325 | 8 days supply | Qty: 30 | Fill #0

## 2015-03-10 MED FILL — DICLOFENAC SOD EC 75 MG TAB: 75 | 30 days supply | Qty: 60 | Fill #0

## 2015-03-10 NOTE — Patient Instructions (Signed)
You sprained your MCL and I'm concerned you may have torn meniscus as well. Ice the knee 15 minutes at a time 3-4 times a day. ACE wrap for compression. Elevate above your heart as much as possible. Straight leg raises, knee extensions 3 sets of 10 once a day when you're able to. Voltaren twice a day with food for pain and inflammation. Norco as needed for severe pain

## 2015-03-11 ENCOUNTER — Ambulatory Visit: Payer: BLUE CROSS/BLUE SHIELD | Admitting: Family Medicine

## 2015-03-15 NOTE — Assessment & Plan Note (Signed)
independently reviewed radiographs and no evidence fracture.  Consistent with MCL sprain and probable medial meniscus tear.  Will trial conservative treatment - icing, ACE wrap, elevation, voltaren with norco as needed.  Home exercise program reviewed.  Follow up in 2 weeks for reevaluation.

## 2015-03-15 NOTE — Progress Notes (Signed)
PCP: Esperanza Richters, PA-C  Subjective:   HPI: Patient is a 60 y.o. female here for left knee injury.  Patient reports 6 months ago she had stepped up onto front porch and developed acute left knee pain- resolved with icing and tylenol. Then 2 weeks ago she accidentally twisted this same knee and felt/heard a pop and crack in the knee. + swelling. Has been icing and taking tylenol again, using ACE wrap. Still feels unstable. Pain 10/10 throbbing, anteromedial. Difficulty walking. No skin changes, fever.  Past Medical History  Diagnosis Date  . Allergy   . Asthma   . Hypertension   . Hyperlipidemia   . Depression   . Anxiety   . COPD (chronic obstructive pulmonary disease) (HCC)     Current Outpatient Prescriptions on File Prior to Visit  Medication Sig Dispense Refill  . albuterol (PROVENTIL HFA;VENTOLIN HFA) 108 (90 BASE) MCG/ACT inhaler Inhale 1-2 puffs into the lungs every 4 (four) hours as needed for wheezing or shortness of breath. 1 Inhaler 11  . ALPRAZolam (XANAX) 0.5 MG tablet Take 1 tablet in morning and 1 tablet at bedtime for sleep. 60 tablet 0  . amLODipine-olmesartan (AZOR) 5-20 MG per tablet 2 tabs daily 180 tablet 1  . buPROPion (WELLBUTRIN SR) 150 MG 12 hr tablet 1 tab po q day for 3 days. Then 1 tab po bid 63 tablet 0  . Fenofibrate 50 MG CAPS Take 1 capsule (50 mg total) by mouth once. 30 each 3  . furosemide (LASIX) 40 MG tablet Take 1 tablet (40 mg total) by mouth daily. 30 tablet 11  . gabapentin (NEURONTIN) 400 MG capsule Take 1 capsule at bedtime or as directed. 90 capsule 3  . ipratropium (ATROVENT HFA) 17 MCG/ACT inhaler Inhale 2 puffs into the lungs every 6 (six) hours as needed for wheezing. 1 Inhaler 12  . potassium chloride SA (K-DUR,KLOR-CON) 20 MEQ tablet Take 1 tablet (20 mEq total) by mouth daily. 30 tablet 11   No current facility-administered medications on file prior to visit.    Past Surgical History  Procedure Laterality Date  .  Slipped capital femoral epiphysis pinning    . Hip pinning      3 each hip  . Foot surgery      Left    Allergies  Allergen Reactions  . Terbinafine Hcl Swelling  . Avelox [Moxifloxacin Hcl In Nacl]   . Montelukast Sodium Other (See Comments)    Insomnia  . Pravastatin Other (See Comments)    Myalgias on   . Zocor [Simvastatin - High Dose]     Social History   Social History  . Marital Status: Widowed    Spouse Name: N/A  . Number of Children: N/A  . 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 Smoking status: Current Every Day Smoker -- 1.00 packs/day for 35 years    Types: Cigarettes  . Smokeless tobacco: Never Used  . Alcohol Use: Not on file  . Drug Use: Not on file  . Sexual Activity: Not on file   Other Topics Concern  . Not on file   Social History Narrative   Does not exercise.    Family History  Problem Relation Age of Onset  . Hypertension Mother   . Hyperlipidemia Mother   . Hypertension Brother   . Asthma Brother     BP 144/80 mmHg  Pulse 76  Ht 5'  9" (1.753 m)  Wt 290 lb (131.543 kg)  BMI 42.81 kg/m2  Review of Systems: See HPI above.    Objective:  Physical Exam:  Gen: NAD  Left knee: Mild effusion.  No bruising, other deformity. TTP medial joint line.  No other tenderness. ROM 0 - 120 degrees. Negative ant/post drawers. Pain but no laxity on valgus testing. Negative varus. Negative lachmanns. Positive mcmurrays, apleys, negative patellar apprehension. NV intact distally.  Right knee: FROM without pain.    Assessment & Plan:  1. Left knee injury - independently reviewed radiographs and no evidence fracture.  Consistent with MCL sprain and probable medial meniscus tear.  Will trial conservative treatment - icing, ACE wrap, elevation, voltaren with norco as needed.  Home exercise program reviewed.  Follow up in 2 weeks for reevaluation.

## 2015-03-23 ENCOUNTER — Telehealth: Payer: Self-pay | Admitting: *Deleted

## 2015-03-23 NOTE — Telephone Encounter (Signed)
Unable to reach patient at time of call. Left message for patient to return call when available and schedule CPE w/ Ramon Dredge.

## 2015-03-24 ENCOUNTER — Ambulatory Visit: Payer: BLUE CROSS/BLUE SHIELD | Admitting: Family Medicine

## 2015-04-07 ENCOUNTER — Encounter: Payer: Self-pay | Admitting: Family Medicine

## 2015-04-07 ENCOUNTER — Ambulatory Visit (INDEPENDENT_AMBULATORY_CARE_PROVIDER_SITE_OTHER): Payer: BLUE CROSS/BLUE SHIELD | Admitting: Family Medicine

## 2015-04-07 VITALS — BP 127/77 | HR 75 | Ht 69.0 in | Wt 290.0 lb

## 2015-04-07 DIAGNOSIS — M25562 Pain in left knee: Secondary | ICD-10-CM

## 2015-04-07 MED ORDER — METHOCARBAMOL 500 MG PO TABS: 500.0000 mg | ORAL_TABLET | Freq: Three times a day (TID) | ORAL | Status: DC | PRN

## 2015-04-07 MED FILL — METHOCARBAMOL 500 MG TABLET: 500 | 20 days supply | Qty: 60 | Fill #0

## 2015-04-07 NOTE — Patient Instructions (Signed)
Start physical therapy for your left quad weakness and left knee pain. Icing 15 minutes at a time 3-4 times a day as needed. Do home exercises on days you don't go to therapy. ACE wrap as needed for support. Try robaxin for spasms at nighttime. Use the voltaren only as needed now. I wouldn't take the norco any more (that was the pain medicine). Follow up with me in 5-6 weeks for reevaluation.

## 2015-04-12 NOTE — Assessment & Plan Note (Signed)
previously independently reviewed radiographs and no evidence fracture.  MCL sprain clinically healed at this point.  Probable medial meniscus tear though clinically improved - most of pain more related to quad weakness.  Improving clinically overall.  Icing, home exercises, ACE wrap for support.  Try robaxin for spasms she gets in right leg.  Voltaren only as needed.  Start physical therapy.  F/u in 5-6 weeks.

## 2015-04-12 NOTE — Progress Notes (Signed)
PCP: Esperanza Richters, PA-C  Subjective:   HPI: Patient is a 60 y.o. female here for left knee injury.  2/1: Patient reports 6 months ago she had stepped up onto front porch and developed acute left knee pain- resolved with icing and tylenol. Then 2 weeks ago she accidentally twisted this same knee and felt/heard a pop and crack in the knee. + swelling. Has been icing and taking tylenol again, using ACE wrap. Still feels unstable. Pain 10/10 throbbing, anteromedial. Difficulty walking. No skin changes, fever.  3/1: Patient reports she feels about 70% improved compared to last visit. Pain is still sharp - has associated muscle cramps but in right leg now. Pain level 5/10 at worst. Using ACE wrap for support, icing, voltaren with norco as needed - notes these help her. No skin changes, fever, other complaints.  Past Medical History  Diagnosis Date  . Allergy   . Asthma   . Hypertension   . Hyperlipidemia   . Depression   . Anxiety   . COPD (chronic obstructive pulmonary disease) (HCC)     Current Outpatient Prescriptions on File Prior to Visit  Medication Sig Dispense Refill  . albuterol (PROVENTIL HFA;VENTOLIN HFA) 108 (90 BASE) MCG/ACT inhaler Inhale 1-2 puffs into the lungs every 4 (four) hours as needed for wheezing or shortness of breath. 1 Inhaler 11  . ALPRAZolam (XANAX) 0.5 MG tablet Take 1 tablet in morning and 1 tablet at bedtime for sleep. 60 tablet 0  . amLODipine-olmesartan (AZOR) 5-20 MG per tablet 2 tabs daily 180 tablet 1  . buPROPion (WELLBUTRIN SR) 150 MG 12 hr tablet 1 tab po q day for 3 days. Then 1 tab po bid 63 tablet 0  . diclofenac (VOLTAREN) 75 MG EC tablet Take 1 tablet (75 mg total) by mouth 2 (two) times daily. 60 tablet 1  . Fenofibrate 50 MG CAPS Take 1 capsule (50 mg total) by mouth once. 30 each 3  . furosemide (LASIX) 40 MG tablet Take 1 tablet (40 mg total) by mouth daily. 30 tablet 11  . gabapentin (NEURONTIN) 400 MG capsule Take 1 capsule  at bedtime or as directed. 90 capsule 3  . ipratropium (ATROVENT HFA) 17 MCG/ACT inhaler Inhale 2 puffs into the lungs every 6 (six) hours as needed for wheezing. 1 Inhaler 12  . potassium chloride SA (K-DUR,KLOR-CON) 20 MEQ tablet Take 1 tablet (20 mEq total) by mouth daily. 30 tablet 11   No current facility-administered medications on file prior to visit.    Past Surgical History  Procedure Laterality Date  . Slipped capital femoral epiphysis pinning    . Hip pinning      3 each hip  . Foot surgery      Left    Allergies  Allergen Reactions  . Terbinafine Hcl Swelling  . Avelox [Moxifloxacin Hcl In Nacl]   . Montelukast Sodium Other (See Comments)    Insomnia  . Pravastatin Other (See Comments)    Myalgias on   . Zocor [Simvastatin - High Dose]     Social History   Social History  . Marital Status: Widowed    Spouse Name: N/A  . Number of Children: N/A  . 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 Smoking status: Current Every Day Smoker -- 1.00 packs/day for 35 years    Types: Cigarettes  . Smokeless tobacco: Never Used  . Alcohol Use: Not on  file  . Drug Use: Not on file  . Sexual Activity: Not on file   Other Topics Concern  . Not on file   Social History Narrative   Does not exercise.    Family History  Problem Relation Age of Onset  . Hypertension Mother   . Hyperlipidemia Mother   . Hypertension Brother   . Asthma Brother     BP 127/77 mmHg  Pulse 75  Ht 5\' 9"  (1.753 m)  Wt 290 lb (131.543 kg)  BMI 42.81 kg/m2  Review of Systems: See HPI above.    Objective:  Physical Exam:  Gen: NAD  Left knee: No effusion.  No bruising, other deformity. TTP quad tendon area.  No other tenderness. ROM 0 - 120 degrees. Negative ant/post drawers. No pain or laxity on valgus now. Negative varus. Negative lachmanns. Negative mcmurrays, apleys, negative patellar apprehension. NV  intact distally.  Right knee: FROM without pain.    Assessment & Plan:  1. Left knee injury - previously independently reviewed radiographs and no evidence fracture.  MCL sprain clinically healed at this point.  Probable medial meniscus tear though clinically improved - most of pain more related to quad weakness.  Improving clinically overall.  Icing, home exercises, ACE wrap for support.  Try robaxin for spasms she gets in right leg.  Voltaren only as needed.  Start physical therapy.  F/u in 5-6 weeks.

## 2015-05-27 MED FILL — METHOCARBAMOL 500 MG TABLET: 500 | 20 days supply | Qty: 60 | Fill #1

## 2015-09-24 ENCOUNTER — Encounter: Payer: Self-pay | Admitting: Medical

## 2015-09-24 ENCOUNTER — Ambulatory Visit (INDEPENDENT_AMBULATORY_CARE_PROVIDER_SITE_OTHER): Payer: Self-pay | Admitting: Medical

## 2015-09-24 VITALS — BP 160/61 | HR 72 | Temp 98.0°F | Ht 69.0 in | Wt 240.6 lb

## 2015-09-24 DIAGNOSIS — I10 Essential (primary) hypertension: Secondary | ICD-10-CM

## 2015-09-24 DIAGNOSIS — F411 Generalized anxiety disorder: Secondary | ICD-10-CM

## 2015-09-24 LAB — COMPREHENSIVE METABOLIC PANEL
ALT: 8 U/L (ref 0–35)
AST: 10 U/L (ref 0–37)
Albumin: 3.9 g/dL (ref 3.5–5.2)
Alkaline Phosphatase: 85 U/L (ref 39–117)
BUN: 9 mg/dL (ref 6–23)
CHLORIDE: 103 meq/L (ref 96–112)
CO2: 32 meq/L (ref 19–32)
Calcium: 9.3 mg/dL (ref 8.4–10.5)
Creatinine, Ser: 0.57 mg/dL (ref 0.40–1.20)
GFR: 114.26 mL/min (ref >60.00)
GLUCOSE: 94 mg/dL (ref 70–99)
POTASSIUM: 3.8 meq/L (ref 3.5–5.1)
Sodium: 140 mEq/L (ref 135–145)
Total Bilirubin: 0.6 mg/dL (ref 0.2–1.2)
Total Protein: 7.4 g/dL (ref 6.0–8.3)

## 2015-09-24 MED ORDER — ALPRAZOLAM 0.5 MG PO TABS: 0.5000 mg | ORAL_TABLET | Freq: Two times a day (BID) | ORAL | 0 refills | Status: DC | PRN

## 2015-09-24 MED ORDER — LOSARTAN POTASSIUM 100 MG PO TABS: 100.0000 mg | ORAL_TABLET | Freq: Every day | ORAL | 0 refills | Status: DC

## 2015-09-24 MED FILL — ALPRAZolam 0.5 MG TABS: 0.5 | 15 days supply | Qty: 30 | Fill #0

## 2015-09-24 MED FILL — LOSARTAN POTASSIUM 100 MG T: 100 | 30 days supply | Qty: 30 | Fill #0

## 2015-09-24 NOTE — Progress Notes (Signed)
Subjective:    Patient ID: [name] J Streed, female    DOB: 03/10/1955, 60 y.o.   MRN: [id number]  HPI    Pt in for evaluation.  Pt mom had stroke past December(mom passed about one month ago). She describes stress. Pt states she stopped eating 3 pm in afternoon. Then only fruits and vegetables afterwards. Then started loosing weight. She lost 41 lb since march. No pedal edema today. No cardiac or neurologic signs or symptoms. But occasional ha with bp elevation.  Pt still some emotional over passing of her mom. Pt was having panic attacks shortly after her mom passed. She was on xanax since April. She states does not want to take every day.  Pt states ran out in April. Pt states has been on no meds since April. Pt systolic has been 165 at times. Diastolic in 80's at times. Pt prior azor would cost $343 dollars.   Pt is still smoking but less. Some breathing issues in past. Pt noticed with weight loss she has been breathing a lot better. Her 02 sat 96% usually.  Pt has been investigating insurance options and just can't afford. Thus completing health maintenance if an issue.     Review of Systems  Constitutional: Negative for chills, fatigue and fever.  Respiratory: Negative for cough, chest tightness, shortness of breath and wheezing.   Cardiovascular: Negative for chest pain and palpitations.  Gastrointestinal: Negative for abdominal pain.  Musculoskeletal: Negative for back pain.  Neurological: Negative for dizziness, seizures, syncope, weakness and light-headedness.  Hematological: Negative for adenopathy. Does not bruise/bleed easily.  Psychiatric/Behavioral: Negative for behavioral problems, confusion, self-injury and suicidal ideas. The patient is nervous/anxious.     Past Medical History:  Diagnosis Date  . Allergy   . Anxiety   . Asthma   . COPD (chronic obstructive pulmonary disease) (HCC)   . Depression   . Hyperlipidemia   . Hypertension      Social History    Social History  . Marital status: Widowed    Spouse name: N/A  . Number of children: N/A  . 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 Smoking status: Current Every Day Smoker    Packs/day: 1.00    Years: 35.00    Types: Cigarettes  . Smokeless tobacco: Never Used  . Alcohol use Not on file  . Drug use: Unknown  . Sexual activity: Not on file   Other Topics Concern  . Not on file   Social History Narrative   Does not exercise.    Past Surgical History:  Procedure Laterality Date  . FOOT SURGERY     Left  . HIP PINNING     3 each hip  . SLIPPED CAPITAL FEMORAL EPIPHYSIS PINNING      Family History  Problem Relation Age of Onset  . Hypertension Mother   . Hyperlipidemia Mother   . Hypertension Brother   . Asthma Brother     Allergies  Allergen Reactions  . Terbinafine Hcl Swelling  . Avelox [Moxifloxacin Hcl In Nacl]   . Montelukast Sodium Other (See Comments)    Insomnia  . Pravastatin Other (See Comments)    Myalgias on 20mg   . Zocor [Simvastatin - High Dose]     Current Outpatient Prescriptions on File Prior to Visit  Medication Sig Dispense Refill  . albuterol (PROVENTIL HFA;VENTOLIN HFA) 108 (90 BASE) MCG/ACT inhaler Inhale 1-2 puffs  into the lungs every 4 (four) hours as needed for wheezing or shortness of breath. 1 Inhaler 11  . ALPRAZolam (XANAX) 0.5 MG tablet Take 1 tablet in morning and 1 tablet at bedtime for sleep. 60 tablet 0  . furosemide (LASIX) 40 MG tablet Take 1 tablet (40 mg total) by mouth daily. 30 tablet 11  . gabapentin (NEURONTIN) 400 MG capsule Take 1 capsule at bedtime or as directed. 90 capsule 3  . amLODipine-olmesartan (AZOR) 5-20 MG per tablet 2 tabs daily (Patient not taking: Reported on 09/24/2015) 180 tablet 1  . buPROPion (WELLBUTRIN SR) 150 MG 12 hr tablet 1 tab po q day for 3 days. Then 1 tab po bid (Patient not taking: Reported on 09/24/2015) 63 tablet  0  . diclofenac (VOLTAREN) 75 MG EC tablet Take 1 tablet (75 mg total) by mouth 2 (two) times daily. (Patient not taking: Reported on 09/24/2015) 60 tablet 1  . Fenofibrate 50 MG CAPS Take 1 capsule (50 mg total) by mouth once. (Patient not taking: Reported on 09/24/2015) 30 each 3  . ipratropium (ATROVENT HFA) 17 MCG/ACT inhaler Inhale 2 puffs into the lungs every 6 (six) hours as needed for wheezing. (Patient not taking: Reported on 09/24/2015) 1 Inhaler 12  . methocarbamol (ROBAXIN) 500 MG tablet Take 1 tablet (500 mg total) by mouth every 8 (eight) hours as needed. (Patient not taking: Reported on 09/24/2015) 60 tablet 1  . potassium chloride SA (K-DUR,KLOR-CON) 20 MEQ tablet Take 1 tablet (20 mEq total) by mouth daily. (Patient not taking: Reported on 09/24/2015) 30 tablet 11   No current facility-administered medications on file prior to visit.     BP (!) 160/61 Comment: Out of BP meds for 3 months  Pulse 72   Temp 98 F (36.7 C) (Oral)   Ht 5\' 9"  (1.753 m)   Wt 240 lb 9.6 oz (109.1 kg)   SpO2 98%   BMI 35.53 kg/m       Objective:   Physical Exam  General Mental Status- Alert. General Appearance- Not in acute distress.   Skin General: Color- Normal Color. Moisture- Normal Moisture.  Neck Carotid Arteries- Normal color. Moisture- Normal Moisture. No carotid bruits. No JVD.  Chest and Lung Exam Auscultation: Breath Sounds:-Normal.  Cardiovascular Auscultation:Rythm- Regular. Murmurs & Other Heart Sounds:Auscultation of the heart reveals- No Murmurs.   Neurologic Cranial Nerve exam:- CN III-XII intact(No nystagmus), symmetric smile. Strength:- 5/5 equal and symmetric strength both upper and lower extremities.      Assessment & Plan:  For your htn will rx losartan. Hopefully this will bring down adequately. May need to add other med in near future on follow up. Please get cmp today.  For recent anxiety will rx xanax. Use sparingly for severe anxiety or panic attacks.  If needing daily/long term would need to sign contract and give drug screen. Also if daily use would want to add ssri type med. Also would add ssri if daily depressed mood.  Please try to decrease or stop smoking. Glad breathing much better with weight loss.  Follow up in 2 weeks or as needed  Nollie Shiflett, Ramon DredgeEdward, VF CorporationPA-C

## 2015-09-24 NOTE — Patient Instructions (Signed)
For your htn will rx losartan. Hopefully this will bring down adequately. May need to add other med in near future on follow up. Please get cmp today.  For recent anxiety will rx xanax. Use sparingly for severe anxiety or panic attacks. If needing daily/long term would need to sign contract and give drug screen. Also if daily use would want to add ssri type med. Also would add ssri if daily depressed mood.  Please try to decrease or stop smoking. Glad breathing much better with weight loss.  Follow up in 2 weeks or as needed

## 2015-09-24 NOTE — Progress Notes (Signed)
Pre visit review using our clinic tool,if applicable. No additional management support is needed unless otherwise documented below in the visit note.  

## 2015-09-27 NOTE — Progress Notes (Signed)
Pt has seen results on MyChart and message also sent for patient to call back if any questions.

## 2015-10-28 ENCOUNTER — Ambulatory Visit: Payer: Self-pay | Admitting: Medical

## 2015-10-29 ENCOUNTER — Ambulatory Visit (INDEPENDENT_AMBULATORY_CARE_PROVIDER_SITE_OTHER): Payer: Self-pay | Admitting: Medical

## 2015-10-29 ENCOUNTER — Encounter: Payer: Self-pay | Admitting: Medical

## 2015-10-29 VITALS — BP 145/70 | HR 75 | Temp 97.8°F | Ht 69.0 in | Wt 250.8 lb

## 2015-10-29 DIAGNOSIS — J0100 Acute maxillary sinusitis, unspecified: Secondary | ICD-10-CM

## 2015-10-29 DIAGNOSIS — F411 Generalized anxiety disorder: Secondary | ICD-10-CM

## 2015-10-29 DIAGNOSIS — I10 Essential (primary) hypertension: Secondary | ICD-10-CM

## 2015-10-29 MED ORDER — BENZONATATE 100 MG PO CAPS: 100.0000 mg | ORAL_CAPSULE | Freq: Three times a day (TID) | ORAL | 0 refills | Status: DC | PRN

## 2015-10-29 MED ORDER — ALPRAZOLAM 0.5 MG PO TABS: 0.5000 mg | ORAL_TABLET | Freq: Two times a day (BID) | ORAL | 0 refills | Status: DC | PRN

## 2015-10-29 MED ORDER — HYDROCHLOROTHIAZIDE 12.5 MG PO CAPS
12.5000 mg | ORAL_CAPSULE | Freq: Every day | ORAL | 0 refills | Status: DC
Start: 2015-10-29 — End: 2016-04-05

## 2015-10-29 MED ORDER — FLUTICASONE PROPIONATE 50 MCG/ACT NA SUSP: 2.0000 | Freq: Every day | NASAL | 1 refills | Status: DC

## 2015-10-29 MED ORDER — LOSARTAN POTASSIUM 100 MG PO TABS: 100.0000 mg | ORAL_TABLET | Freq: Every day | ORAL | 0 refills | Status: DC

## 2015-10-29 MED ORDER — AMOXICILLIN-POT CLAVULANATE 875-125 MG PO TABS: 1.0000 | ORAL_TABLET | Freq: Two times a day (BID) | ORAL | 0 refills | Status: DC

## 2015-10-29 MED FILL — HYDROCHLOROTHIAZIDE 12.5 MG: 12.5 | 30 days supply | Qty: 30 | Fill #0

## 2015-10-29 MED FILL — LOSARTAN POTASSIUM 100 MG T: 100 | 30 days supply | Qty: 30 | Fill #0

## 2015-10-29 MED FILL — BENZONATATE 100 MG CAPSULE: 100 | 7 days supply | Qty: 21 | Fill #0

## 2015-10-29 MED FILL — SM ALLERGY RELIEF 50 MCG SP: 50 MCG | 30 days supply | Qty: 16 | Fill #0

## 2015-10-29 MED FILL — AMOX-CLAV 875-125 MG TABLET: 875-125 | 10 days supply | Qty: 20 | Fill #0

## 2015-10-29 MED FILL — ALPRAZolam 0.5 MG TABS: 0.5 | 30 days supply | Qty: 60 | Fill #0

## 2015-10-29 NOTE — Patient Instructions (Addendum)
Your appear to have a sinus infection. I am prescribing antibiotic augmentin  for the infection. To help with the nasal congestion I prescribed flonase  nasal steroid. For your associated cough, I prescribed benzonatate cough medicine.  Rest, hydrate, tylenol for fever.  For anxiety will rx xanax. Follow up in one month. Will need to sign contract and follow controlled med guidelines.  For htn refill losartan. Check bp daily and if you see over 140/90 then go ahead and add hctz diuretic.  Follow up in 7 days or as needed.

## 2015-10-29 NOTE — Progress Notes (Signed)
Subjective:    Patient ID: Rita Watkins, female    DOB: 12-11-1955, 60 y.o.   MRN: [id number]  HPI   Pt in for recent nasal congestion. Sinus pressure and pain for 3 days. Now coughing up some muocus now.   No sneezing or itching eyes yet. But usually does get itching eyes.  Pt breathing stable.   Also needs refill of her xanax. Anxiety related to memories of deceased husband. Pt failed zolfot and did not help her anxiety.   Review of Systems  Constitutional: Positive for chills, fatigue and fever.  HENT: Positive for congestion and sinus pressure. Negative for dental problem, nosebleeds, postnasal drip and sore throat.   Respiratory: Positive for cough. Negative for chest tightness, shortness of breath and wheezing.   Cardiovascular: Negative for chest pain and palpitations.  Musculoskeletal: Negative for back pain.  Skin: Negative for rash.  Neurological: Negative for dizziness and headaches.  Hematological: Negative for adenopathy. Does not bruise/bleed easily.  Psychiatric/Behavioral: Negative for confusion.    Past Medical History:  Diagnosis Date  . Allergy   . Anxiety   . Asthma   . COPD (chronic obstructive pulmonary disease) (HCC)   . Depression   . Hyperlipidemia   . Hypertension      Social History   Social History  . Marital status: Widowed    Spouse name: N/A  . Number of children: N/A  . 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 Smoking status: Current Every Day Smoker    Packs/day: 1.00    Years: 35.00    Types: Cigarettes  . Smokeless tobacco: Never Used  . Alcohol use Not on file  . Drug use: Unknown  . Sexual activity: Not on file   Other Topics Concern  . Not on file   Social History Narrative   Does not exercise.    Past Surgical History:  Procedure Laterality Date  . FOOT SURGERY     Left  . HIP PINNING     3 each hip  . SLIPPED CAPITAL FEMORAL  EPIPHYSIS PINNING      Family History  Problem Relation Age of Onset  . Hypertension Mother   . Hyperlipidemia Mother   . Hypertension Brother   . Asthma Brother     Allergies  Allergen Reactions  . Terbinafine Hcl Swelling  . Avelox [Moxifloxacin Hcl In Nacl]   . Montelukast Sodium Other (See Comments)    Insomnia  . Pravastatin Other (See Comments)    Myalgias on 20mg   . Zocor [Simvastatin - High Dose]     Current Outpatient Prescriptions on File Prior to Visit  Medication Sig Dispense Refill  . ALPRAZolam (XANAX) 0.5 MG tablet Take 1 tablet (0.5 mg total) by mouth 2 (two) times daily as needed for anxiety. 30 tablet 0  . losartan (COZAAR) 100 MG tablet Take 1 tablet (100 mg total) by mouth daily. 30 tablet 0   No current facility-administered medications on file prior to visit.     BP (!) 145/70   Pulse 75   Temp 97.8 F (36.6 C) (Oral)   Ht 5\' 9"  (1.753 m)   Wt 250 lb 12.8 oz (113.8 kg)   SpO2 97%   BMI 37.04 kg/m       Objective:   Physical Exam  General  Mental Status - Alert. General Appearance - Well groomed. Not in acute distress.  Skin  Rashes- No Rashes.  HEENT Head- Normal. Ear Auditory Canal - Left- Normal. Right - Normal.Tympanic Membrane- Left- Normal. Right- Normal. Eye Sclera/Conjunctiva- Left- Normal. Right- Normal. Nose & Sinuses Nasal Mucosa- Left-  Boggy and Congested. Right-  Boggy and  Congested.Bilateral  Moderate- severe maxillary and mild  frontal sinus pressure. Mouth & Throat Lips: Upper Lip- Normal: no dryness, cracking, pallor, cyanosis, or vesicular eruption. Lower Lip-Normal: no dryness, cracking, pallor, cyanosis or vesicular eruption. Buccal Mucosa- Bilateral- No Aphthous ulcers. Oropharynx- No Discharge or Erythema. Tonsils: Characteristics- Bilateral- No Erythema or Congestion. Size/Enlargement- Bilateral- No enlargement. Discharge- bilateral-None.  Neck Neck- Supple. No Masses.   Chest and Lung  Exam Auscultation: Breath Sounds:-Clear even and unlabored.  Cardiovascular Auscultation:Rythm- Regular, rate and rhythm. Murmurs & Other Heart Sounds:Ausculatation of the heart reveal- No Murmurs.  Lymphatic Head & Neck General Head & Neck Lymphatics: Bilateral: Description- No Localized lymphadenopathy.       Assessment & Plan:  Your appear to have a sinus infection. I am prescribing antibiotic augmentin  for the infection. To help with the nasal congestion I prescribed flonase  nasal steroid. For your associated cough, I prescribed benzonatate cough medicine.  Rest, hydrate, tylenol for fever.  For anxiety will rx xanax. Follow up in one month. Will need to sign contract and follow controlled med guidelines.  For htn refill losartan. Check bp daily and if you see over 140/90 then go ahead and add hctz diuretic.  Follow up in 7 days or as needed.  Genavieve Mangiapane, Ramon Dredge, PA-C

## 2015-10-29 NOTE — Progress Notes (Signed)
Pre visit review using our clinic tool,if applicable. No additional management support is needed unless otherwise documented below in the visit note.  

## 2015-11-29 ENCOUNTER — Ambulatory Visit (INDEPENDENT_AMBULATORY_CARE_PROVIDER_SITE_OTHER): Payer: Self-pay | Admitting: Medical

## 2015-11-29 ENCOUNTER — Encounter: Payer: Self-pay | Admitting: Medical

## 2015-11-29 VITALS — BP 138/90 | HR 75 | Temp 98.0°F | Ht 69.0 in | Wt 255.0 lb

## 2015-11-29 DIAGNOSIS — I10 Essential (primary) hypertension: Secondary | ICD-10-CM

## 2015-11-29 DIAGNOSIS — Z23 Encounter for immunization: Secondary | ICD-10-CM

## 2015-11-29 DIAGNOSIS — F411 Generalized anxiety disorder: Secondary | ICD-10-CM

## 2015-11-29 MED ORDER — LOSARTAN POTASSIUM-HCTZ 100-12.5 MG PO TABS: 1.0000 | ORAL_TABLET | Freq: Every day | ORAL | 1 refills | Status: DC

## 2015-11-29 MED ORDER — ALPRAZOLAM 0.5 MG PO TABS: 0.5000 mg | ORAL_TABLET | Freq: Two times a day (BID) | ORAL | 2 refills | Status: DC | PRN

## 2015-11-29 MED FILL — ALPRAZolam 0.5 MG TABS: 0.5 | 30 days supply | Qty: 60 | Fill #0

## 2015-11-29 MED FILL — LOSARTAN-HCTZ 100-12.5 MG T: 100-12.5 | 90 days supply | Qty: 90 | Fill #0

## 2015-11-29 NOTE — Patient Instructions (Addendum)
Your bp is well controlled today and not on bp mecication. I will rx combination tablet of losartan and hctz today.  For your anxiety will refill you xanax today and give refills today as well.  UDS today and contract signed.  Follow up in 3 months or as needed

## 2015-11-29 NOTE — Progress Notes (Signed)
Subjective:    Patient ID: Rita Watkins, female    DOB: [birth year], 60 y.o.   MRN: [id number]  HPI  Pt in for follow up.   Her sinus infection did clear up. Saw her last visit for this. No current respiratory symptoms as well.  Pt bp initially 136/90. When pt checks at home diastolic 80-85. Pt systolic 135-140. Pt has been very busy at work. No gross motor or sensory function deficits.  Pt can get flu vaccine. She reqest today.  Pt has used used xanax in the past. She uses xanax twice a day. She expects holidays will be difficullty months as her husband passed away and she has difficulty with anxiety. Some stress as work. She feels that she is doing well taking xanax twice daily.   Review of Systems  Constitutional: Negative for chills, fatigue and fever.  HENT: Negative for congestion, ear discharge, mouth sores and nosebleeds.   Respiratory: Negative for cough, chest tightness, shortness of breath and wheezing.   Cardiovascular: Negative for palpitations.  Gastrointestinal: Negative for abdominal distention, abdominal pain, constipation, nausea and vomiting.  Musculoskeletal: Negative for back pain, joint swelling, myalgias and neck pain.  Neurological: Negative for dizziness, seizures, speech difficulty, weakness and headaches.  Hematological: Negative for adenopathy. Does not bruise/bleed easily.  Psychiatric/Behavioral: Negative for behavioral problems, confusion, sleep disturbance and suicidal ideas. The patient is nervous/anxious.     Past Medical History:  Diagnosis Date  . Allergy   . Anxiety   . Asthma   . COPD (chronic obstructive pulmonary disease) (HCC)   . Depression   . Hyperlipidemia   . Hypertension      Social History   Social History  . Marital status: Widowed    Spouse name: N/A  . Number of children: N/A  . 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 Smoking status: Current  Every Day Smoker    Packs/day: 1.00    Years: 35.00    Types: Cigarettes  . Smokeless tobacco: Never Used  . Alcohol use Not on file  . Drug use: Unknown  . Sexual activity: Not on file   Other Topics Concern  . Not on file   Social History Narrative   Does not exercise.    Past Surgical History:  Procedure Laterality Date  . FOOT SURGERY     Left  . HIP PINNING     3 each hip  . SLIPPED CAPITAL FEMORAL EPIPHYSIS PINNING      Family History  Problem Relation Age of Onset  . Hypertension Mother   . Hyperlipidemia Mother   . Hypertension Brother   . Asthma Brother     Allergies  Allergen Reactions  . Terbinafine Hcl Swelling  . Avelox [Moxifloxacin Hcl In Nacl]   . Montelukast Sodium Other (See Comments)    Insomnia  . Pravastatin Other (See Comments)    Myalgias on 20mg   . Zocor [Simvastatin - High Dose]     Current Outpatient Prescriptions on File Prior to Visit  Medication Sig Dispense Refill  . ALPRAZolam (XANAX) 0.5 MG tablet Take 1 tablet (0.5 mg total) by mouth 2 (two) times daily as needed for anxiety. 60 tablet 0  . amoxicillin-clavulanate (AUGMENTIN) 875-125 MG tablet Take 1 tablet by mouth 2 (two) times daily. 20 tablet 0  . hydrochlorothiazide (MICROZIDE) 12.5 MG capsule Take 1 capsule (12.5 mg total) by mouth daily. 30 capsule  0  . losartan (COZAAR) 100 MG tablet Take 1 tablet (100 mg total) by mouth daily. 30 tablet 0   No current facility-administered medications on file prior to visit.     BP 138/90   Pulse 75   Temp 98 F (36.7 C) (Oral)   Ht 5\' 9"  (1.753 m)   Wt 255 lb (115.7 kg)   SpO2 98%   BMI 37.66 kg/m       Objective:   Physical Exam  General Mental Status- Alert. General Appearance- Not in acute distress.   Skin General: Color- Normal Color. Moisture- Normal Moisture.  Heent- no sinus pressure.  Neck Carotid Arteries- Normal color. Moisture- Normal Moisture. No carotid bruits. No JVD.  Chest and Lung  Exam Auscultation: Breath Sounds:-Normal.  Cardiovascular Auscultation:Rythm- Regular. Murmurs & Other Heart Sounds:Auscultation of the heart reveals- No Murmurs.  Abdomen Inspection:-Inspeection Normal. Palpation/Percussion:Note:No mass. Palpation and Percussion of the abdomen reveal- Non Tender, Non Distended + BS, no rebound or guarding.  Neurologic Cranial Nerve exam:- CN III-XII intact(No nystagmus), symmetric smile. Strength:- 5/5 equal and symmetric strength both upper and lower extremities.      Assessment & Plan:  Your bp is well controlled today and not on bp mecication. I will rx combination tablet of losartan and hctz today.  For your anxiety will refill you xanax today and give refills today as well.  UDS today and contract signed.(pt was advised to go directly to the lab and give staff the signed contract to file)  Follow up in 3 months or as needed  Flu vaccine given today.  Jan Walters, Ramon Dredge, PA-C

## 2015-11-29 NOTE — Progress Notes (Signed)
Pre visit review using our clinic review tool, if applicable. No additional management support is needed unless otherwise documented below in the visit note. 

## 2016-01-13 MED FILL — ALPRAZolam 0.5 MG TABS: 0.5 | 30 days supply | Qty: 60 | Fill #1

## 2016-02-22 MED FILL — LOSARTAN-HCTZ 100-12.5 MG T: 100-12.5 | 90 days supply | Qty: 90 | Fill #1

## 2016-02-22 MED FILL — ALPRAZolam 0.5 MG TABS: 0.5 | 30 days supply | Qty: 60 | Fill #2

## 2016-03-21 ENCOUNTER — Ambulatory Visit (INDEPENDENT_AMBULATORY_CARE_PROVIDER_SITE_OTHER): Payer: Self-pay | Admitting: Podiatry

## 2016-03-21 ENCOUNTER — Ambulatory Visit (HOSPITAL_BASED_OUTPATIENT_CLINIC_OR_DEPARTMENT_OTHER): Payer: Self-pay

## 2016-03-21 DIAGNOSIS — L02612 Cutaneous abscess of left foot: Secondary | ICD-10-CM

## 2016-03-21 DIAGNOSIS — S99922A Unspecified injury of left foot, initial encounter: Secondary | ICD-10-CM

## 2016-03-21 DIAGNOSIS — L97521 Non-pressure chronic ulcer of other part of left foot limited to breakdown of skin: Secondary | ICD-10-CM

## 2016-03-21 DIAGNOSIS — L03032 Cellulitis of left toe: Secondary | ICD-10-CM

## 2016-03-21 MED ORDER — HYDROCODONE-ACETAMINOPHEN 5-325 MG PO TABS: 1.0000 | ORAL_TABLET | ORAL | 0 refills | Status: DC | PRN

## 2016-03-21 MED ORDER — CEPHALEXIN 500 MG PO CAPS: 500.0000 mg | ORAL_CAPSULE | Freq: Three times a day (TID) | ORAL | 2 refills | Status: DC

## 2016-03-21 MED FILL — CEPHALEXIN 500 MG CAPSULE: 500 | 10 days supply | Qty: 30 | Fill #0

## 2016-03-21 MED FILL — HYDROCODON-APAP 5-325: 5-325 | 4 days supply | Qty: 20 | Fill #0

## 2016-03-21 NOTE — Progress Notes (Signed)
Subjective:     Patient ID: Rita FieldingSally J Watkins, female   DOB: Jan 25, 1956, 61 y.o.   MRN: [id number]  HPI 61 year old female presents the office today for concerns of left foot pain to her toes. She states that she dropped a heavy water bottle on this area back in December by before the new year. She states that  She had blistering to her toes which eventually turned in scabs which she removed. Since that she's had quite a bit of pain and she points the third and fourth toes were she gets the majority of pain. She has noticed that those  toes will burn at times. She has had no treatment for this.   Review of Systems  All other systems reviewed and are negative.      Objective:   Physical Exam General: AAO x3, NAD  Dermatological: There is edema and feet erythema to the left fourth toe. Along the interspace on the third interspace there is a superficial granular wound present encompassing the entire medial aspect of the fourth toe. There is no drainage or pus expressed there is no ascending cellulitis. There is no fluctuance or crepitus there's no malodor. There is no other open lesions identified at this time.  Vascular: Dorsalis Pedis artery and Posterior Tibial artery pedal pulses are 2/4 bilateral with immediate capillary fill time. Pedal hair growth present. Chronic swelling to bilateral ankles present. There is no pain with calf compression, swelling, warmth, erythema.   Neruologic: Grossly intact via light touch bilateral. Vibratory intact via tuning fork bilateral. Protective threshold with Semmes Wienstein monofilament intact to all pedal sites bilateral.   Musculoskeletal: There is tenderness palpation to the third and fourth toes mostly to the wound on the fourth toe. She would not let me touch the 3rd or 4th toe and she pulled them apart for me to look at the wounds.There is minimal tenderness on the second third metatarsals on the shaft. There is no erythema or increase in warmth. There is  no other areas of tenderness identified bilaterally. Muscular strength 5/5 in all groups tested bilateral.  Gait: Unassisted, Nonantalgic.      Assessment:     Ulceration left fourth toe; rule out fracture    Plan:     -Treatment options discussed including all alternatives, risks, and complications -Etiology of symptoms were discussed -X-rays ordered. Discussed with him to come back up stairs after the x-ray but she would like to hold off as it is a lot of and will cause pain to her toe. I will call her with the result of the x-ray -Surgical shoe dispensed today. She is having swelling and pain and I would like to help eliminate the pressure to the toes to help the wound heal. -I was going to put a bandage on to help with the drainage and heal but she declined a bandage today. I ordered calcium alginate dressing.  -Will start keflex. -Rx vicodin due to pain.  -Discussed that if the toe does not heal or get further infected she could end up with a toe amputation. If not significant healing next appointment will order arterial studies.  -Monitor for any clinical signs or symptoms of infection and directed to call the office immediately should any occur or go to the ER. -RTC in 2 weeks or sooner if needed.   Ovid CurdMatthew Chad Donoghue, DPM

## 2016-03-30 MED FILL — CEPHALEXIN 500 MG CAPSULE: 500 | 10 days supply | Qty: 30 | Fill #1

## 2016-03-31 ENCOUNTER — Ambulatory Visit (HOSPITAL_BASED_OUTPATIENT_CLINIC_OR_DEPARTMENT_OTHER)
Admission: RE | Admit: 2016-03-31 | Discharge: 2016-03-31 | Disposition: A | Discharge status: Home / Self Care | Payer: Self-pay | Source: Ambulatory Visit | Attending: Podiatry | Admitting: Podiatry

## 2016-03-31 DIAGNOSIS — S99922A Unspecified injury of left foot, initial encounter: Secondary | ICD-10-CM | POA: Insufficient documentation

## 2016-03-31 DIAGNOSIS — X58XXXA Exposure to other specified factors, initial encounter: Secondary | ICD-10-CM | POA: Insufficient documentation

## 2016-03-31 DIAGNOSIS — M7989 Other specified soft tissue disorders: Secondary | ICD-10-CM | POA: Insufficient documentation

## 2016-04-05 ENCOUNTER — Ambulatory Visit (INDEPENDENT_AMBULATORY_CARE_PROVIDER_SITE_OTHER): Payer: Self-pay | Admitting: Medical

## 2016-04-05 ENCOUNTER — Encounter: Payer: Self-pay | Admitting: Medical

## 2016-04-05 VITALS — BP 160/55 | HR 83 | Temp 97.5°F | Resp 16 | Ht 69.0 in | Wt 254.1 lb

## 2016-04-05 DIAGNOSIS — F411 Generalized anxiety disorder: Secondary | ICD-10-CM

## 2016-04-05 DIAGNOSIS — M79672 Pain in left foot: Secondary | ICD-10-CM

## 2016-04-05 DIAGNOSIS — R600 Localized edema: Secondary | ICD-10-CM

## 2016-04-05 DIAGNOSIS — I10 Essential (primary) hypertension: Secondary | ICD-10-CM

## 2016-04-05 LAB — COMPREHENSIVE METABOLIC PANEL
ALBUMIN: 3.9 g/dL (ref 3.5–5.2)
ALK PHOS: 80 U/L (ref 39–117)
ALT: 10 U/L (ref 0–35)
AST: 11 U/L (ref 0–37)
BILIRUBIN TOTAL: 0.5 mg/dL (ref 0.2–1.2)
BUN: 13 mg/dL (ref 6–23)
CALCIUM: 9.3 mg/dL (ref 8.4–10.5)
CO2: 29 mEq/L (ref 19–32)
Chloride: 107 mEq/L (ref 96–112)
Creatinine, Ser: 0.68 mg/dL (ref 0.40–1.20)
GFR: 93.04 mL/min (ref >60.00)
GLUCOSE: 89 mg/dL (ref 70–99)
POTASSIUM: 4.1 meq/L (ref 3.5–5.1)
Sodium: 137 mEq/L (ref 135–145)
TOTAL PROTEIN: 7.5 g/dL (ref 6.0–8.3)

## 2016-04-05 MED ORDER — ALPRAZOLAM 0.5 MG PO TABS: 0.5000 mg | ORAL_TABLET | Freq: Two times a day (BID) | ORAL | 3 refills | Status: DC | PRN

## 2016-04-05 MED ORDER — LOSARTAN POTASSIUM-HCTZ 100-12.5 MG PO TABS: 1.0000 | ORAL_TABLET | Freq: Every day | ORAL | 1 refills | Status: DC

## 2016-04-05 MED FILL — ALPRAZolam 0.5 MG TABS: 0.5 | 30 days supply | Qty: 60 | Fill #0

## 2016-04-05 NOTE — Progress Notes (Signed)
Pre visit review using our clinic review tool, if applicable. No additional management support is needed unless otherwise documented below in the visit note/SLS  

## 2016-04-05 NOTE — Progress Notes (Signed)
Subjective:    Patient ID: Rita Watkins, female    DOB: 25-Jun-1955, 61 y.o.   MRN: [id number]  HPI   Pt in and states needs refills of anxiety medications. I had given her xanax with refills. Pt is on contract controlled med and gave drug screen on last visit. Pt states she is going to widow support group and this does help her anxiety. Some days does not have to take xanax.  Pt has htn. Pt states when she check her bp at home usually 130/70 range. Currently she states high bp due to pain. She checks her bp once a week and consistent controlled. NO cardiac or neurologic signs or symptoms.  Pt has foot pain. Pt has seen Dr. Ardelle AntonWagoner. Pt had ulceration between toes. Pt was placed on keflex. Pt has been putting betadine strips between her toes. Pt was given vicodin for occasional severe pain. She takes it sparingly. She states foot is getting better and she see Dr. Ardelle AntonWagoner next Tuesday. Pt states if elevates her leg foot pain is moderate. Sitting left foot does not hurt.  No calf pain. No popliteal pain.     Review of Systems  Constitutional: Negative for chills, fatigue and fever.  Respiratory: Negative for cough, chest tightness, shortness of breath and wheezing.   Cardiovascular: Negative for chest pain and palpitations.  Gastrointestinal: Negative for abdominal pain, blood in stool, constipation, diarrhea, nausea and vomiting.  Musculoskeletal:       Left foot pain. But improving.  Skin: Negative for rash.  Neurological: Negative for dizziness, syncope, speech difficulty, weakness, light-headedness, numbness and headaches.  Hematological: Negative for adenopathy. Does not bruise/bleed easily.  Psychiatric/Behavioral: Negative for behavioral problems, confusion, decreased concentration, self-injury and suicidal ideas. The patient is nervous/anxious.      Past Medical History:  Diagnosis Date  . Allergy   . Anxiety   . Asthma   . COPD (chronic obstructive pulmonary disease) (HCC)    . Depression   . Hyperlipidemia   . Hypertension      Social History   Social History  . Marital status: Widowed    Spouse name: N/A  . Number of children: N/A  . 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 Smoking status: Current Every Day Smoker    Packs/day: 1.00    Years: 35.00    Types: Cigarettes  . Smokeless tobacco: Never Used  . Alcohol use Not on file  . Drug use: Unknown  . Sexual activity: Not on file   Other Topics Concern  . Not on file   Social History Narrative   Does not exercise.    Past Surgical History:  Procedure Laterality Date  . FOOT SURGERY     Left  . HIP PINNING     3 each hip  . SLIPPED CAPITAL FEMORAL EPIPHYSIS PINNING      Family History  Problem Relation Age of Onset  . Hypertension Mother   . Hyperlipidemia Mother   . Hypertension Brother   . Asthma Brother     Allergies  Allergen Reactions  . Terbinafine Hcl Swelling  . Avelox [Moxifloxacin Hcl In Nacl]   . Montelukast Sodium Other (See Comments)    Insomnia  . Pravastatin Other (See Comments)    Myalgias on 20mg   . Zocor [Simvastatin - High Dose]     Current Outpatient Prescriptions on File Prior to Visit  Medication Sig  Dispense Refill  . ALPRAZolam (XANAX) 0.5 MG tablet Take 1 tablet (0.5 mg total) by mouth 2 (two) times daily as needed for anxiety. 60 tablet 2  . cephALEXin (KEFLEX) 500 MG capsule Take 1 capsule (500 mg total) by mouth 3 (three) times daily. 30 capsule 2  . HYDROcodone-acetaminophen (NORCO/VICODIN) 5-325 MG tablet Take 1 tablet by mouth every 4 (four) hours as needed. 20 tablet 0  . losartan-hydrochlorothiazide (HYZAAR) 100-12.5 MG tablet Take 1 tablet by mouth daily. 90 tablet 1   No current facility-administered medications on file prior to visit.     BP (!) 160/55 (BP Location: Left Arm, Patient Position: Sitting, Cuff Size: Large)   Pulse 83   Temp 97.5 F (36.4 C)  (Oral)   Resp 16   Ht 5\' 9"  (1.753 m)   Wt 254 lb 2 oz (115.3 kg)   SpO2 99%   BMI 37.53 kg/m       Objective:   Physical Exam  General Mental Status- Alert. General Appearance- Not in acute distress.   Skin General: Color- Normal Color. Moisture- Normal Moisture.  Neck Carotid Arteries- Normal color. Moisture- Normal Moisture. No carotid bruits. No JVD.  Chest and Lung Exam Auscultation: Breath Sounds:-Normal.  Cardiovascular Auscultation:Rythm- Regular. Murmurs & Other Heart Sounds:Auscultation of the heart reveals- No Murmurs.  Abdomen Inspection:-Inspeection Normal. Palpation/Percussion:Note:No mass. Palpation and Percussion of the abdomen reveal- Non Tender, Non Distended + BS, no rebound or guarding.  Lower ext- no calf swelling. Both symmetric, negative homans signs. Rt foot- swollen. Left foot in post of shoe.    Neurologic Cranial Nerve exam:- CN III-XII intact(No nystagmus), symmetric smile. Strength:- 5/5 equal and symmetric strength both upper and lower extremities.      Assessment & Plan:  For your bp refilled current med. Your bp is high today but you attribute to pain today in left foot. BP reading good at home. Continue to verify controlled/check at home.  For anxiety refilling your xanax.  For your foot pain follow up with podiatrist.  For pedal edema try to elevated your rt leg some by itself if possible.  Follow up in 3 months-6 months or as needed  Renita Brocks, Ramon Dredge, [place]

## 2016-04-05 NOTE — Patient Instructions (Signed)
For your bp refilled current med. Your bp is high today but you attribute to pain today in left foot. BP reading good at home. Continue to verify controlled/check at home.  For anxiety refilling your xanax.  For your foot pain follow up with podiatrist.  For pedal edema try to elevated your rt leg some by itself if possible.  Follow up in 3 months-6 months or as needed

## 2016-04-11 ENCOUNTER — Ambulatory Visit (INDEPENDENT_AMBULATORY_CARE_PROVIDER_SITE_OTHER): Payer: Self-pay | Admitting: Podiatry

## 2016-04-11 ENCOUNTER — Encounter: Payer: Self-pay | Admitting: Podiatry

## 2016-04-11 ENCOUNTER — Telehealth: Payer: Self-pay | Admitting: *Deleted

## 2016-04-11 DIAGNOSIS — L97521 Non-pressure chronic ulcer of other part of left foot limited to breakdown of skin: Secondary | ICD-10-CM

## 2016-04-11 DIAGNOSIS — M79672 Pain in left foot: Secondary | ICD-10-CM

## 2016-04-11 MED ORDER — GABAPENTIN 100 MG PO CAPS: 100.0000 mg | ORAL_CAPSULE | Freq: Every day | ORAL | 3 refills | Status: DC

## 2016-04-11 MED ORDER — GABAPENTIN 100 MG PO CAPS: 100.0000 mg | ORAL_CAPSULE | Freq: Three times a day (TID) | ORAL | 3 refills | Status: DC

## 2016-04-11 MED FILL — GABAPENTIN 100 MG CAP: 100 | 60 days supply | Qty: 60 | Fill #0

## 2016-04-11 MED FILL — CEPHALEXIN 500 MG CAPSULE: 500 | 10 days supply | Qty: 30 | Fill #2

## 2016-04-11 NOTE — Telephone Encounter (Addendum)
[facility], states received 2 rx for gabapentin 100mg  - 1st for one capsule at bedtime, and 2nd one capsule 3 x a day please clarify. I read Dr.Wagoner's gabapentin notes in 04/11/2016 clinicals, 1st week 1 (100mg ) capsule at night, 2nd week 2 (200mg ) capsules at night and 3rd week 3 (300mg ) capsules at night as tolerated. [facility].07/19/2016-Pt requested pain medication. Pt states she has been in the hospital 2 times after 2 toe surgeries and she has an appt with Dr. Ardelle AntonWagoner 06/192018 at 3:00pm. I told pt Dr. Ardelle AntonWagoner had not seen her since 05/2016 and I did not have orders from Dr.Wagoner for pain medication, but I would inform Dr. Ardelle AntonWagoner. I did see that she had received oxycodone 06/30/2016 from Dr. Hyacinth MeekerMiller.

## 2016-04-11 NOTE — Patient Instructions (Signed)
Start taking gabapentin 100mg  at night time. After one week take 2 pills at night (200mg ). On Week three take 3 pills at night (300mg ) as long as you can tolerate it.   Gabapentin capsules or tablets What is this medicine? GABAPENTIN (GA ba pen tin) is used to control partial seizures in adults with epilepsy. It is also used to treat certain types of nerve pain. This medicine may be used for other purposes; ask your health care provider or pharmacist if you have questions. COMMON BRAND NAME(S): Active-PAC with Gabapentin, Gabarone, Neurontin What should I tell my health care provider before I take this medicine? They need to know if you have any of these conditions: -kidney disease -suicidal thoughts, plans, or attempt; a previous suicide attempt by you or a family member -an unusual or allergic reaction to gabapentin, other medicines, foods, dyes, or preservatives -pregnant or trying to get pregnant -breast-feeding How should I use this medicine? Take this medicine by mouth with a glass of water. Follow the directions on the prescription label. You can take it with or without food. If it upsets your stomach, take it with food.Take your medicine at regular intervals. Do not take it more often than directed. Do not stop taking except on your doctor's advice. If you are directed to break the 600 or 800 mg tablets in half as part of your dose, the extra half tablet should be used for the next dose. If you have not used the extra half tablet within 28 days, it should be thrown away. A special MedGuide will be given to you by the pharmacist with each prescription and refill. Be sure to read this information carefully each time. Talk to your pediatrician regarding the use of this medicine in children. Special care may be needed. Overdosage: If you think you have taken too much of this medicine contact a poison control center or emergency room at once. NOTE: This medicine is only for you. Do not share  this medicine with others. What if I miss a dose? If you miss a dose, take it as soon as you can. If it is almost time for your next dose, take only that dose. Do not take double or extra doses. What may interact with this medicine? Do not take this medicine with any of the following medications: -other gabapentin products This medicine may also interact with the following medications: -alcohol -antacids -antihistamines for allergy, cough and cold -certain medicines for anxiety or sleep -certain medicines for depression or psychotic disturbances -homatropine; hydrocodone -naproxen -narcotic medicines (opiates) for pain -phenothiazines like chlorpromazine, mesoridazine, prochlorperazine, thioridazine This list may not describe all possible interactions. Give your health care provider a list of all the medicines, herbs, non-prescription drugs, or dietary supplements you use. Also tell them if you smoke, drink alcohol, or use illegal drugs. Some items may interact with your medicine. What should I watch for while using this medicine? Visit your doctor or health care professional for regular checks on your progress. You may want to keep a record at home of how you feel your condition is responding to treatment. You may want to share this information with your doctor or health care professional at each visit. You should contact your doctor or health care professional if your seizures get worse or if you have any new types of seizures. Do not stop taking this medicine or any of your seizure medicines unless instructed by your doctor or health care professional. Stopping your medicine suddenly can increase  your seizures or their severity. Wear a medical identification bracelet or chain if you are taking this medicine for seizures, and carry a card that lists all your medications. You may get drowsy, dizzy, or have blurred vision. Do not drive, use machinery, or do anything that needs mental alertness  until you know how this medicine affects you. To reduce dizzy or fainting spells, do not sit or stand up quickly, especially if you are an older patient. Alcohol can increase drowsiness and dizziness. Avoid alcoholic drinks. Your mouth may get dry. Chewing sugarless gum or sucking hard candy, and drinking plenty of water will help. The use of this medicine may increase the chance of suicidal thoughts or actions. Pay special attention to how you are responding while on this medicine. Any worsening of mood, or thoughts of suicide or dying should be reported to your health care professional right away. Women who become pregnant while using this medicine may enroll in the [place] American Antiepileptic Drug Pregnancy Registry by calling [phone]. This registry collects information about the safety of antiepileptic drug use during pregnancy. What side effects may I notice from receiving this medicine? Side effects that you should report to your doctor or health care professional as soon as possible: -allergic reactions like skin rash, itching or hives, swelling of the face, lips, or tongue -worsening of mood, thoughts or actions of suicide or dying Side effects that usually do not require medical attention (report to your doctor or health care professional if they continue or are bothersome): -constipation -difficulty walking or controlling muscle movements -dizziness -nausea -slurred speech -tiredness -tremors -weight gain This list may not describe all possible side effects. Call your doctor for medical advice about side effects. You may report side effects to FDA at 1-800-FDA-1088. Where should I keep my medicine? Keep out of reach of children. This medicine may cause accidental overdose and death if it taken by other adults, children, or pets. Mix any unused medicine with a substance like cat litter or coffee grounds. Then throw the medicine away in a sealed container like a sealed bag or a  coffee can with a lid. Do not use the medicine after the expiration date. Store at room temperature between 15 and 30 degrees C (59 and 86 degrees F). NOTE: This sheet is a summary. It may not cover all possible information. If you have questions about this medicine, talk to your doctor, pharmacist, or health care provider.  2018 Elsevier/Gold Standard (2013-03-21 15:26:50)

## 2016-04-16 NOTE — Progress Notes (Signed)
Subjective: 61 year old female presents the office they for follow-up evaluation of wound to left foot. She does follow with her primary care physician she was placed on Lasix due to swelling of legs. She states that she has been doing the daily dressing changes and they have started to dry up however she still states there is pain mostly to the fourth toe. She says the wounds was completely healed except for part of it on the fourth toe which is still causing her pain. She does not reattach her toes today. She denies any increase in swelling, redness or warmth in denies any pus. Denies any systemic complaints such as fevers, chills, nausea, vomiting. No acute changes since last appointment, and no other complaints at this time.   Objective: AAO x3, NAD DP/PT pulses palpable bilaterally, CRT less than 3 seconds Superficial wearing that her ulcerations present at the distal aspect the lateral fourth digit which is tender to palpate. There is minimal edema to the toes. There is no erythema or increase in warmth. There is no drainage or pus expressed. This chronic swelling to bilateral lower extremities. No open lesions or pre-ulcerative lesions.  No pain with calf compression, swelling, warmth, erythema  Assessment: Healing ulceration left foot  Plan: -All treatment options discussed with the patient including all alternatives, risks, complications.  -Continue a surgical shoe. Continue with daily dressing changes well into the wound is completely healed. Finish her course of antibiotics. She gets a refill encouraged her does complete this course and we will hold off any further antibiotics as necessary. Monitor for any clinical signs or symptoms of infection and directed to call the office immediately should any occur or go to the ER. -Patient encouraged to call the office with any questions, concerns, change in symptoms.   Ovid CurdMatthew Wagoner, DPM

## 2016-05-02 ENCOUNTER — Encounter: Payer: Self-pay | Admitting: Podiatry

## 2016-05-02 ENCOUNTER — Ambulatory Visit (INDEPENDENT_AMBULATORY_CARE_PROVIDER_SITE_OTHER): Payer: Self-pay | Admitting: Podiatry

## 2016-05-02 DIAGNOSIS — L97521 Non-pressure chronic ulcer of other part of left foot limited to breakdown of skin: Secondary | ICD-10-CM

## 2016-05-02 MED ORDER — CLINDAMYCIN HCL 300 MG PO CAPS: 300.0000 mg | ORAL_CAPSULE | Freq: Three times a day (TID) | ORAL | 0 refills | Status: DC

## 2016-05-02 MED ORDER — GABAPENTIN 300 MG PO CAPS: 300.0000 mg | ORAL_CAPSULE | Freq: Every day | ORAL | 3 refills | Status: DC

## 2016-05-02 MED FILL — GABAPENTIN 300 MG CAPSULE: 300 | 90 days supply | Qty: 90 | Fill #0

## 2016-05-02 MED FILL — CLINDAMYCIN HCL 300 MG CAPS: 300 | 10 days supply | Qty: 30 | Fill #0

## 2016-05-03 NOTE — Progress Notes (Signed)
Subjective: 61 year old female presents the office they for follow-up evaluation of wound to left foot. She says that overall she is doing better and the swelling and the pain has improved. She's been using the collagen and silver to the toe daily. She states that she is still concerned about with toe looks on the fourth toe. She is continue the surgical shoe. She has not taken antibiotics at this time. She also states that gabapentin is helping quite a bit. She stated 300 mg at nighttime however she still has some of the discomfort and asthma increase the dose. Denies any systemic complaints such as fevers, chills, nausea, vomiting. No acute changes since last appointment, and no other complaints at this time.   Objective: AAO x3, NAD DP/PT pulses palpable bilaterally, CRT less than 3 seconds Superficial wearing that her ulcerations present at the distal aspect the lateral fourth digit which is tender to palpate. The wounds towards the distal aspect of the toe and has more macerated periwound today. The nails lesion underlying nail bed. There is no ascending synovitis. There is mild edema to the toes and to the forefoot however this is much improved. There is no erythema or increase in warmth. There is no fluxions, crepitus, malodor.  No open lesions or pre-ulcerative lesions.  No pain with calf compression, swelling, warmth, erythema  Assessment: Healing ulceration left foot with macerated periwound  Plan: -All treatment options discussed with the patient including all alternatives, risks, complications.  -This woman discussed her taking the toenail offices very loosened Nail bed and went underneath it, she declines this today. We will restart antibiotics prescribed clindamycin. We'll do Betadine dressing changes to the wound to the maceration. Discussed with her possible toe amputation or partial amputation which she does not want to proceed with this at this time. Discussed that this is a  possibility. I'll see her back in 2 weeks and that time we'll repeat x-rays. Continue with surgical shoe. Monitor for any clinical signs or symptoms of infection and directed to call the office immediately should any occur or go to the ER. -Order to give up in 300 mg taken nighttime. She can take 100 mg at lunch and see how she does. She will start decided that she is not working.  Ovid CurdMatthew Wagoner, DPM

## 2016-05-11 MED FILL — ALPRAZolam 0.5 MG TABS: 0.5 | 30 days supply | Qty: 60 | Fill #1

## 2016-05-12 MED FILL — GABAPENTIN 100 MG CAP: 100 | 60 days supply | Qty: 60 | Fill #1

## 2016-05-16 ENCOUNTER — Ambulatory Visit: Payer: Self-pay | Admitting: Podiatry

## 2016-05-23 ENCOUNTER — Ambulatory Visit (INDEPENDENT_AMBULATORY_CARE_PROVIDER_SITE_OTHER): Payer: Self-pay | Admitting: Podiatry

## 2016-05-23 DIAGNOSIS — L97521 Non-pressure chronic ulcer of other part of left foot limited to breakdown of skin: Secondary | ICD-10-CM

## 2016-05-23 MED ORDER — CLINDAMYCIN HCL 300 MG PO CAPS: 300.0000 mg | ORAL_CAPSULE | Freq: Three times a day (TID) | ORAL | 0 refills | Status: DC

## 2016-05-23 MED FILL — LOSARTAN-HCTZ 100-12.5 MG T: 100-12.5 | 90 days supply | Qty: 90 | Fill #0

## 2016-05-23 MED FILL — CLINDAMYCIN HCL 300 MG CAPS: 300 | 14 days supply | Qty: 42 | Fill #0

## 2016-05-27 NOTE — Progress Notes (Signed)
Subjective: 61 year old female presents the office they for follow-up evaluation of wound to left foot. She states that she was doing well however which female the interbody she started a small medically or drainage. Overall she states the pain is much improved. She denies any pus and she denies any redness or red streaks. The swelling that she was having is also improved. Denies any systemic complaints such as fevers, chills, nausea, vomiting. No acute changes since last appointment, and no other complaints at this time.   Objective: AAO x3, NAD DP/PT pulses palpable bilaterally, CRT less than 3 seconds Along the medial aspect of the left fourth digit is a scab which appears of a deeper area of scab present the distal lateral portion of the toe. Overall the wound is much improved however still concerned of the distal lateral portion. This may be full-thickness. There is no fluctuance or crepitus. Very small minimal amount of clear drainage is expressed but there is no pus. There is no swelling erythema or ascending synovitis. There is no malodor. Much improved tenderness the toe.  No open lesions or pre-ulcerative lesions.  No pain with calf compression, swelling, warmth, erythema  Assessment: Healing ulceration left foot with macerated periwound  Plan: -All treatment options discussed with the patient including all alternatives, risks, complications.  -Recommended continue with daily dressing changes a Betadine wet-to-dry dressing for now. We will also restart antibiotics with small amount of drainage. Discussed with her she may end up needing a partial amputation of the toe  but hopefully we can avoid this. I'm still concerned about the distal portion of the digit along the medial aspect. Continue a surgical shoe for now. -Monitor for any clinical signs or symptoms of infection and directed to call the office immediately should any occur or go to the ER.  Ovid Curd, DPM

## 2016-06-05 ENCOUNTER — Inpatient Hospital Stay (HOSPITAL_BASED_OUTPATIENT_CLINIC_OR_DEPARTMENT_OTHER)
Admission: EM | Admit: 2016-06-05 | Discharge: 2016-06-09 | DRG: 571 | Disposition: A | Discharge status: Home Health | Payer: Self-pay | Attending: Internal Medicine | Admitting: Internal Medicine

## 2016-06-05 ENCOUNTER — Encounter (HOSPITAL_BASED_OUTPATIENT_CLINIC_OR_DEPARTMENT_OTHER): Payer: Self-pay | Admitting: *Deleted

## 2016-06-05 ENCOUNTER — Emergency Department (HOSPITAL_BASED_OUTPATIENT_CLINIC_OR_DEPARTMENT_OTHER): Payer: Self-pay

## 2016-06-05 DIAGNOSIS — A319 Mycobacterial infection, unspecified: Secondary | ICD-10-CM

## 2016-06-05 DIAGNOSIS — F329 Major depressive disorder, single episode, unspecified: Secondary | ICD-10-CM | POA: Diagnosis present

## 2016-06-05 DIAGNOSIS — F17210 Nicotine dependence, cigarettes, uncomplicated: Secondary | ICD-10-CM | POA: Diagnosis present

## 2016-06-05 DIAGNOSIS — J449 Chronic obstructive pulmonary disease, unspecified: Secondary | ICD-10-CM | POA: Diagnosis present

## 2016-06-05 DIAGNOSIS — Z881 Allergy status to other antibiotic agents status: Secondary | ICD-10-CM

## 2016-06-05 DIAGNOSIS — L03116 Cellulitis of left lower limb: Principal | ICD-10-CM

## 2016-06-05 DIAGNOSIS — I96 Gangrene, not elsewhere classified: Secondary | ICD-10-CM | POA: Diagnosis present

## 2016-06-05 DIAGNOSIS — R823 Hemoglobinuria: Secondary | ICD-10-CM | POA: Diagnosis not present

## 2016-06-05 DIAGNOSIS — F410 Panic disorder [episodic paroxysmal anxiety] without agoraphobia: Secondary | ICD-10-CM | POA: Diagnosis present

## 2016-06-05 DIAGNOSIS — Z888 Allergy status to other drugs, medicaments and biological substances status: Secondary | ICD-10-CM

## 2016-06-05 DIAGNOSIS — Z6836 Body mass index (BMI) 36.0-36.9, adult: Secondary | ICD-10-CM

## 2016-06-05 DIAGNOSIS — G629 Polyneuropathy, unspecified: Secondary | ICD-10-CM | POA: Diagnosis present

## 2016-06-05 DIAGNOSIS — E785 Hyperlipidemia, unspecified: Secondary | ICD-10-CM | POA: Diagnosis present

## 2016-06-05 DIAGNOSIS — L97509 Non-pressure chronic ulcer of other part of unspecified foot with unspecified severity: Secondary | ICD-10-CM | POA: Diagnosis present

## 2016-06-05 DIAGNOSIS — M7989 Other specified soft tissue disorders: Secondary | ICD-10-CM

## 2016-06-05 DIAGNOSIS — L03032 Cellulitis of left toe: Secondary | ICD-10-CM

## 2016-06-05 DIAGNOSIS — L02612 Cutaneous abscess of left foot: Secondary | ICD-10-CM

## 2016-06-05 DIAGNOSIS — I10 Essential (primary) hypertension: Secondary | ICD-10-CM | POA: Diagnosis present

## 2016-06-05 DIAGNOSIS — S81802A Unspecified open wound, left lower leg, initial encounter: Secondary | ICD-10-CM

## 2016-06-05 DIAGNOSIS — L0291 Cutaneous abscess, unspecified: Secondary | ICD-10-CM

## 2016-06-05 HISTORY — DX: Mycobacterial infection, unspecified: A31.9

## 2016-06-05 HISTORY — DX: Lymphangitis: I89.1

## 2016-06-05 LAB — URINALYSIS, ROUTINE W REFLEX MICROSCOPIC
Bilirubin Urine: NEGATIVE
GLUCOSE, UA: NEGATIVE mg/dL
Ketones, ur: NEGATIVE mg/dL
LEUKOCYTES UA: NEGATIVE
Nitrite: NEGATIVE
PH: 5.5 (ref 5.0–8.0)
Protein, ur: NEGATIVE mg/dL
Specific Gravity, Urine: 1.01 (ref 1.005–1.030)

## 2016-06-05 LAB — COMPREHENSIVE METABOLIC PANEL
ALBUMIN: 3.4 g/dL — AB (ref 3.5–5.0)
ALT: 11 U/L — ABNORMAL LOW (ref 14–54)
ANION GAP: 9 (ref 5–15)
AST: 13 U/L — AB (ref 15–41)
Alkaline Phosphatase: 82 U/L (ref 38–126)
BUN: 16 mg/dL (ref 6–20)
CHLORIDE: 102 mmol/L (ref 101–111)
CO2: 28 mmol/L (ref 22–32)
Calcium: 9.2 mg/dL (ref 8.9–10.3)
Creatinine, Ser: 0.76 mg/dL (ref 0.44–1.00)
GFR calc Af Amer: >60 mL/min (ref >60)
GFR calc non Af Amer: >60 mL/min (ref >60)
GLUCOSE: 103 mg/dL — AB (ref 65–99)
POTASSIUM: 4.4 mmol/L (ref 3.5–5.1)
SODIUM: 139 mmol/L (ref 135–145)
TOTAL PROTEIN: 8 g/dL (ref 6.5–8.1)
Total Bilirubin: 0.6 mg/dL (ref 0.3–1.2)

## 2016-06-05 LAB — CBC
HCT: 34.3 % — ABNORMAL LOW (ref 36.0–46.0)
Hemoglobin: 11.6 g/dL — ABNORMAL LOW (ref 12.0–15.0)
MCH: 30.4 pg (ref 26.0–34.0)
MCHC: 33.8 g/dL (ref 30.0–36.0)
MCV: 90 fL (ref 78.0–100.0)
PLATELETS: 364 10*3/uL (ref 150–400)
RBC: 3.81 MIL/uL — ABNORMAL LOW (ref 3.87–5.11)
RDW: 13.2 % (ref 11.5–15.5)
WBC: 13.2 10*3/uL — ABNORMAL HIGH (ref 4.0–10.5)

## 2016-06-05 LAB — CBC WITH DIFFERENTIAL/PLATELET
BASOS ABS: 0 10*3/uL (ref 0.0–0.1)
Basophils Relative: 0 %
EOS PCT: 1 %
Eosinophils Absolute: 0.2 10*3/uL (ref 0.0–0.7)
HEMATOCRIT: 36.2 % (ref 36.0–46.0)
Hemoglobin: 11.9 g/dL — ABNORMAL LOW (ref 12.0–15.0)
LYMPHS ABS: 1.9 10*3/uL (ref 0.7–4.0)
LYMPHS PCT: 15 %
MCH: 30 pg (ref 26.0–34.0)
MCHC: 32.9 g/dL (ref 30.0–36.0)
MCV: 91.2 fL (ref 78.0–100.0)
MONO ABS: 1 10*3/uL (ref 0.1–1.0)
Monocytes Relative: 8 %
NEUTROS ABS: 9.5 10*3/uL — AB (ref 1.7–7.7)
Neutrophils Relative %: 76 %
PLATELETS: 399 10*3/uL (ref 150–400)
RBC: 3.97 MIL/uL (ref 3.87–5.11)
RDW: 13 % (ref 11.5–15.5)
WBC: 12.6 10*3/uL — ABNORMAL HIGH (ref 4.0–10.5)

## 2016-06-05 LAB — URINALYSIS, MICROSCOPIC (REFLEX)

## 2016-06-05 LAB — I-STAT CG4 LACTIC ACID, ED: LACTIC ACID, VENOUS: 1.95 mmol/L — AB (ref 0.5–1.9)

## 2016-06-05 LAB — CREATININE, SERUM
CREATININE: 0.69 mg/dL (ref 0.44–1.00)
GFR calc Af Amer: >60 mL/min (ref >60)
GFR calc non Af Amer: >60 mL/min (ref >60)

## 2016-06-05 LAB — CBG MONITORING, ED: GLUCOSE-CAPILLARY: 105 mg/dL — AB (ref 65–99)

## 2016-06-05 MED ORDER — MORPHINE SULFATE (PF) 4 MG/ML IV SOLN
6.0000 mg | Freq: Once | INTRAVENOUS | Status: AC
  Administered 2016-06-05: 6 mg via INTRAVENOUS
  Filled 2016-06-05: qty 2

## 2016-06-05 MED ORDER — HYDRALAZINE HCL 20 MG/ML IJ SOLN: 5.0000 mg | Freq: Four times a day (QID) | INTRAMUSCULAR | Status: DC | PRN

## 2016-06-05 MED ORDER — ONDANSETRON HCL 4 MG/2ML IJ SOLN: 4.0000 mg | Freq: Four times a day (QID) | INTRAMUSCULAR | Status: DC | PRN

## 2016-06-05 MED ORDER — MORPHINE SULFATE (PF) 4 MG/ML IV SOLN
4.0000 mg | Freq: Once | INTRAVENOUS | Status: AC
  Administered 2016-06-05: 4 mg via INTRAVENOUS
  Filled 2016-06-05: qty 1

## 2016-06-05 MED ORDER — VANCOMYCIN HCL IN DEXTROSE 1-5 GM/200ML-% IV SOLN
1000.0000 mg | Freq: Two times a day (BID) | INTRAVENOUS | Status: DC
  Administered 2016-06-05 – 2016-06-07 (×4): 1000 mg via INTRAVENOUS
  Filled 2016-06-05 (×2): qty 200

## 2016-06-05 MED ORDER — PIPERACILLIN-TAZOBACTAM 3.375 G IVPB
3.3750 g | Freq: Three times a day (TID) | INTRAVENOUS | Status: DC
  Filled 2016-06-05: qty 50

## 2016-06-05 MED ORDER — PIPERACILLIN-TAZOBACTAM 3.375 G IVPB 30 MIN
3.3750 g | Freq: Once | INTRAVENOUS | Status: DC
  Filled 2016-06-05: qty 50

## 2016-06-05 MED ORDER — HYDROCODONE-ACETAMINOPHEN 5-325 MG PO TABS
1.0000 | ORAL_TABLET | ORAL | Status: DC | PRN
  Administered 2016-06-05 – 2016-06-08 (×7): 1 via ORAL
  Filled 2016-06-05 (×7): qty 1

## 2016-06-05 MED ORDER — ENOXAPARIN SODIUM 40 MG/0.4ML ~~LOC~~ SOLN: 40.0000 mg | SUBCUTANEOUS | Status: DC

## 2016-06-05 MED ORDER — SODIUM CHLORIDE 0.9 % IV SOLN
INTRAVENOUS | Status: DC
  Administered 2016-06-05 – 2016-06-07 (×3): via INTRAVENOUS

## 2016-06-05 MED ORDER — HYDROMORPHONE HCL 4 MG/ML IJ SOLN: 1.0000 mg | INTRAMUSCULAR | Status: DC | PRN

## 2016-06-05 MED ORDER — LOSARTAN POTASSIUM 50 MG PO TABS
100.0000 mg | ORAL_TABLET | Freq: Every day | ORAL | Status: DC
  Administered 2016-06-06 – 2016-06-09 (×4): 100 mg via ORAL
  Filled 2016-06-05 (×4): qty 2

## 2016-06-05 MED ORDER — LOSARTAN POTASSIUM-HCTZ 100-12.5 MG PO TABS: 1.0000 | ORAL_TABLET | Freq: Every day | ORAL | Status: DC

## 2016-06-05 MED ORDER — PIPERACILLIN-TAZOBACTAM 3.375 G IVPB 30 MIN: 3.3750 g | Freq: Once | INTRAVENOUS | Status: DC

## 2016-06-05 MED ORDER — VANCOMYCIN HCL IN DEXTROSE 1-5 GM/200ML-% IV SOLN
1000.0000 mg | Freq: Once | INTRAVENOUS | Status: AC
  Administered 2016-06-05: 1000 mg via INTRAVENOUS
  Filled 2016-06-05: qty 200

## 2016-06-05 MED ORDER — VANCOMYCIN HCL IN DEXTROSE 1-5 GM/200ML-% IV SOLN: 1000.0000 mg | Freq: Once | INTRAVENOUS | Status: DC

## 2016-06-05 MED ORDER — PIPERACILLIN-TAZOBACTAM 3.375 G IVPB
3.3750 g | Freq: Three times a day (TID) | INTRAVENOUS | Status: DC
  Administered 2016-06-05 – 2016-06-06 (×3): 3.375 g via INTRAVENOUS
  Filled 2016-06-05 (×4): qty 50

## 2016-06-05 MED ORDER — GABAPENTIN 300 MG PO CAPS
300.0000 mg | ORAL_CAPSULE | Freq: Every day | ORAL | Status: DC
  Administered 2016-06-05 – 2016-06-08 (×4): 300 mg via ORAL
  Filled 2016-06-05 (×5): qty 1

## 2016-06-05 MED ORDER — HYDROCHLOROTHIAZIDE 12.5 MG PO CAPS
12.5000 mg | ORAL_CAPSULE | Freq: Every day | ORAL | Status: DC
  Administered 2016-06-06 – 2016-06-09 (×4): 12.5 mg via ORAL
  Filled 2016-06-05 (×4): qty 1

## 2016-06-05 MED ORDER — PIPERACILLIN-TAZOBACTAM 3.375 G IVPB 30 MIN
3.3750 g | Freq: Once | INTRAVENOUS | Status: AC
  Administered 2016-06-05: 3.375 g via INTRAVENOUS
  Filled 2016-06-05 (×2): qty 50

## 2016-06-05 MED ORDER — ALPRAZOLAM 0.5 MG PO TABS
0.5000 mg | ORAL_TABLET | Freq: Two times a day (BID) | ORAL | Status: DC | PRN
  Administered 2016-06-05 – 2016-06-08 (×3): 0.5 mg via ORAL
  Filled 2016-06-05 (×3): qty 1

## 2016-06-05 MED ORDER — ONDANSETRON HCL 4 MG PO TABS: 4.0000 mg | ORAL_TABLET | Freq: Four times a day (QID) | ORAL | Status: DC | PRN

## 2016-06-05 MED ORDER — ENOXAPARIN SODIUM 60 MG/0.6ML ~~LOC~~ SOLN
0.5000 mg/kg | SUBCUTANEOUS | Status: DC
  Administered 2016-06-05 – 2016-06-06 (×2): 55 mg via SUBCUTANEOUS
  Filled 2016-06-05 (×3): qty 0.6

## 2016-06-05 MED ORDER — VANCOMYCIN HCL IN DEXTROSE 1-5 GM/200ML-% IV SOLN: 1000.0000 mg | Freq: Two times a day (BID) | INTRAVENOUS | Status: DC

## 2016-06-05 MED ORDER — VANCOMYCIN HCL 10 G IV SOLR
2000.0000 mg | Freq: Once | INTRAVENOUS | Status: DC
  Filled 2016-06-05: qty 2000

## 2016-06-05 NOTE — ED Triage Notes (Signed)
Pt reports treatment for infection to L toe that began after dropping something on her foot in December 2017. Reports seeing Dr. Loreta Ave 2 wks ago who expressed concern that infection might have spread to the bone (was started on Clindamycin). Pt reports swelling and pain has gotten worse over the last 2 wks with wound showing up on L medial knee. Pt denies fever, n/v/d.

## 2016-06-05 NOTE — Progress Notes (Signed)
Pharmacy Antibiotic Note  Rita Watkins is a 61 y.o. female admitted on 06/05/2016 with cellulitis.  Pharmacy has been consulted for vancomycin and Zosyn dosing. Patient started failed outpatient therapy with clindamycin.  Plan: Vancomycin  load x1 then  IV every 12 hours.  Goal trough 15-20 mcg/mL. Zosyn 3.375g IV q8h (4 hour infusion). F/u clinical progression and LOT Monitor renal function and order troughs as appropriate  Height:  (175.3 cm) Weight: 250 lb (113.4 kg) IBW/kg (Calculated) : 66.2  Temp (24hrs), Avg:98.4 F (36.9 C), Min:98.3 F (36.8 C), Max:98.5 F (36.9 C)   Recent Labs Lab 06/05/16 0939 06/05/16 1009  WBC 12.6*  --   CREATININE 0.76  --   LATICACIDVEN  --  1.95*    Estimated Creatinine Clearance: 98 mL/min (by C-G formula based on SCr of 0.76 mg/dL).    Allergies  Allergen Reactions  . Terbinafine Hcl Swelling  . Avelox [Moxifloxacin Hcl In Nacl]   . Montelukast Sodium Other (See Comments)    Insomnia  . Pravastatin Other (See Comments)    Myalgias on   . Zocor [Simvastatin - High Dose]    Thank you for allowing pharmacy to be a part of this patient's care.  Toniann Fail Marigold Mom 06/05/2016 10:34 AM

## 2016-06-05 NOTE — ED Notes (Signed)
ED Provider at bedside. 

## 2016-06-05 NOTE — ED Provider Notes (Signed)
Medical screening examination/treatment/procedure(s) were conducted as a shared visit with non-physician practitioner(s) and myself.  I personally evaluated the patient during the encounter.  Patient dropped something on her toe in February and had a wound there that she went to podiatry. I started her on antibiotics for localized infection however it did not seem to heal well so she has got multiple antibiotic prescriptions over the last couple months. She most recently saw them a couple weeks ago and got started on clindamycin however a few days after that the redness on her foot started to spread up her inner left thigh and outer left shin area. This area became exquisitely painful making it difficult to sleep at night. She also had a couple bulla open up on her left medial thigh associated with this. On my examination she has significant erythema, tenderness and induration all the way up the inside of her left calf and around the knee area she has a couple scabbed over lesions that are stronger by erythema and induration noted exquisitely tender as well. Her vital signs are within normal limits. Her right leg has some erythema to it as well but not as significant. Suspect possible osteomyelitis versus nonhealing cellulitis versus DVT. We'll do an ultrasound, start antibiotics and likely admit to the hospital.   EKG Interpretation  Date/Time:  Monday June 05 2016 09:59:21 EDT Ventricular Rate:  83 PR Interval:    QRS Duration: 117 QT Interval:  388 QTC Calculation: 451 R Axis:   76 Text Interpretation:  Sinus rhythm Atrial premature complex Incomplete right bundle branch block No significant change since last tracing Confirmed by [facility] MD, Barbara Cower [phone]) on 06/05/2016 11:05:41 AM         Marily Memos, MD 06/06/16 [phone]

## 2016-06-05 NOTE — ED Provider Notes (Signed)
[facility] Provider Note   CSN: [id number] Arrival date & time: 06/05/16  4098     History   Chief Complaint Chief Complaint  Patient presents with  . Leg Swelling    HPI Rita Watkins is a 61 y.o. female with PMHx of HTN, COPD, HLD Presents today with complaints of worsening left toe infection for the past 2 weeks. She reports associated redness up the left leg, swelling and pain with a wound showing up on left medial knee. She reports her pain as worsening, constant, throbbing, 10/10 pain. She states she has tried Aleve, Tylenol, soap and water for her symptoms. She states that she dropped something on her foot in December and has been reportedly being seen by Dr. Loreta Ave, podiatrist, for this left toe ulceration and infection. She states she was last seen 2 weeks ago and did not have this redness of her leg and wound on the left medial knee. She states she was started on clindamycin last time she was seen by Dr. Loreta Ave 2 weeks ago for concerns of infection. She states that she has taken antibiotics as prescribed. She denies any nausea, vomiting, diarrhea, fevers, chills. She denies any history of diabetes. She denies any new trauma.   The history is provided by the patient. No language interpreter was used.    Past Medical History:  Diagnosis Date  . Allergy   . Anxiety   . Asthma   . COPD (chronic obstructive pulmonary disease) (HCC)   . Depression   . Hyperlipidemia   . Hypertension     Patient Active Problem List   Diagnosis Date Noted  . Cellulitis 06/05/2016  . Left knee pain 03/15/2015  . Leg swelling 07/30/2014  . Chronic respiratory failure (HCC) 07/30/2014  . Leukocytosis 07/30/2014  . Otitis media 07/30/2014  . HTN (hypertension) 07/21/2014  . Pulmonary infiltrate in right lung on chest x-ray 07/21/2014  . Spinal stenosis of lumbar region 07/21/2014  . COPD pfts pending  07/15/2014  . Cigarette smoker 07/15/2014  . COPD exacerbation (HCC)  04/26/2014  . Acute respiratory failure with hypoxia (HCC) 04/26/2014  . Acute bronchiolitis due to other infectious organisms 04/26/2014  . Depression with anxiety 09/13/2012  . Panic attacks 06/17/2012  . Hyperlipidemia 09/23/2011  . Chronic pain 09/23/2011  . Vitamin D deficiency 09/23/2011    Past Surgical History:  Procedure Laterality Date  . FOOT SURGERY     Left  . HIP PINNING     3 each hip  . SLIPPED CAPITAL FEMORAL EPIPHYSIS PINNING      OB History    No data available       Home Medications    Prior to Admission medications   Medication Sig Start Date End Date Taking? Authorizing Provider  ALPRAZolam Prudy Feeler) 0.5 MG tablet Take 1 tablet (0.5 mg total) by mouth 2 (two) times daily as needed for anxiety. 04/05/16  Yes Edward Saguier, PA-C  cephALEXin (KEFLEX) 500 MG capsule Take 1 capsule (500 mg total) by mouth 3 (three) times daily. 03/21/16  Yes Vivi Barrack, DPM  clindamycin (CLEOCIN) 300 MG capsule Take 1 capsule (300 mg total) by mouth 3 (three) times daily. 05/23/16  Yes Vivi Barrack, DPM  gabapentin (NEURONTIN) 100 MG capsule Take 1 capsule (100 mg total) by mouth at bedtime. 04/11/16  Yes Vivi Barrack, DPM  gabapentin (NEURONTIN) 300 MG capsule Take 1 capsule (300 mg total) by mouth at bedtime. 05/02/16  Yes Vivi Barrack, DPM  losartan-hydrochlorothiazide (HYZAAR) 100-12.5 MG tablet Take 1 tablet by mouth daily. 04/05/16  Yes Edward Saguier, PA-C  HYDROcodone-acetaminophen (NORCO/VICODIN) 5-325 MG tablet Take 1 tablet by mouth every 4 (four) hours as needed. 03/21/16   Vivi Barrack, DPM    Family History Family History  Problem Relation Age of Onset  . Hypertension Mother   . Hyperlipidemia Mother   . Hypertension Brother   . Asthma Brother     Social History Social History  Substance Use Topics  . Smoking status: Current Every Day Smoker    Packs/day: 1.00    Years: 35.00    Types: Cigarettes  . Smokeless tobacco: Never Used    . Alcohol use No     Allergies   Terbinafine hcl; Avelox [moxifloxacin hcl in nacl]; Montelukast sodium; Pravastatin; and Zocor [simvastatin - high dose]   Review of Systems Review of Systems  Constitutional: Negative for chills and fever.  Respiratory: Negative for shortness of breath.   Cardiovascular: Negative for chest pain.  Gastrointestinal: Negative for abdominal pain, diarrhea, nausea and vomiting.  Genitourinary: Negative for difficulty urinating and dysuria.  Musculoskeletal: Positive for arthralgias and myalgias.  Skin: Positive for color change, rash and wound.  All other systems reviewed and are negative.    Physical Exam Updated Vital Signs BP (!) 144/49   Pulse 76   Temp 98.5 F (36.9 C) (Oral)   Resp 15   Ht  (1.753 m)   Wt 113.4 kg   SpO2 100%   BMI 36.92 kg/m   Physical Exam  Constitutional: She appears well-developed and well-nourished.  Well appearing  HENT:  Head: Normocephalic and atraumatic.  Nose: Nose normal.  Eyes: Conjunctivae and EOM are normal.  Neck: Normal range of motion.  Cardiovascular: Normal rate, normal heart sounds and intact distal pulses.   No murmur heard. Pulmonary/Chest: Effort normal and breath sounds normal. No respiratory distress. She has no wheezes.  Normal work of breathing. No respiratory distress noted.   Abdominal: Soft.  Musculoskeletal: Normal range of motion.  Limited range of motion of knee secondary to pain. Range of motion of ankle and feet of the left foot.   Neurological: She is alert.  Sensation intact to BLE. Muscle strength 5/5 to BLE. Patient able to resist flexion and extension of the foot fourth toe.  Skin: Skin is warm. There is erythema.  Left fourth toe appears infectious, dark, affecting toenail. Tender to palpation.  Lower legs with noticeable swelling that she states is chronic and not new.  Wound on left medial aspect of knee, with 2 lesions, both about 2 and half centimeters long.  Dark red centers, tender to palpation.  Police, purplish discoloration around left medial lower leg over medial aspect of calf. Mildly tender to palpation. Skin intact.  See attached photos.  Psychiatric: She has a normal mood and affect. Her behavior is normal.  Nursing note and vitals reviewed.              ED Treatments / Results  Labs (all labs ordered are listed, but only abnormal results are displayed) Labs Reviewed  COMPREHENSIVE METABOLIC PANEL - Abnormal; Notable for the following:       Result Value   Glucose, Bld 103 (*)    Albumin 3.4 (*)    AST 13 (*)    ALT 11 (*)    All other components within normal limits  CBC WITH DIFFERENTIAL/PLATELET - Abnormal; Notable for the following:    WBC 12.6 (*)  Hemoglobin 11.9 (*)    Neutro Abs 9.5 (*)    All other components within normal limits  URINALYSIS, ROUTINE W REFLEX MICROSCOPIC - Abnormal; Notable for the following:    Hgb urine dipstick MODERATE (*)    All other components within normal limits  URINALYSIS, MICROSCOPIC (REFLEX) - Abnormal; Notable for the following:    Bacteria, UA RARE (*)    Squamous Epithelial / LPF 0-5 (*)    All other components within normal limits  CBG MONITORING, ED - Abnormal; Notable for the following:    Glucose-Capillary 105 (*)    All other components within normal limits  I-STAT CG4 LACTIC ACID, ED - Abnormal; Notable for the following:    Lactic Acid, Venous 1.95 (*)    All other components within normal limits  CULTURE, BLOOD (ROUTINE X 2)  CULTURE, BLOOD (ROUTINE X 2)  I-STAT CG4 LACTIC ACID, ED    EKG  EKG Interpretation  Date/Time:  Monday June 05 2016 09:59:21 EDT Ventricular Rate:  83 PR Interval:    QRS Duration: 117 QT Interval:  388 QTC Calculation: 451 R Axis:   76 Text Interpretation:  Sinus rhythm Atrial premature complex Incomplete right bundle branch block No significant change since last tracing Confirmed by [facility] MD, Barbara Cower [phone]) on 06/05/2016  11:05:41 AM       Radiology US Venous Img Lower Unilateral Left  Result Date: 06/05/2016 CLINICAL DATA:  Left lower leg pain, erythema and swelling for 2 weeks. History of left foot infection. EXAM: LEFT LOWER EXTREMITY VENOUS DOPPLER ULTRASOUND TECHNIQUE: Gray-scale sonography with graded compression, as well as color Doppler and duplex ultrasound were performed to evaluate the lower extremity deep venous systems from the level of the common femoral vein and including the common femoral, femoral, profunda femoral, popliteal and calf veins including the posterior tibial, peroneal and gastrocnemius veins when visible. The superficial great saphenous vein was also interrogated. Spectral Doppler was utilized to evaluate flow at rest and with distal augmentation maneuvers in the common femoral, femoral and popliteal veins. COMPARISON:  Foot radiographs 03/31/2016. FINDINGS: Contralateral Common Femoral Vein: Respiratory phasicity is normal and symmetric with the symptomatic side. No evidence of thrombus. Normal compressibility. Common Femoral Vein: No evidence of thrombus. Normal compressibility, respiratory phasicity and response to augmentation. Saphenofemoral Junction: No evidence of thrombus. Normal compressibility and flow on color Doppler imaging. Profunda Femoral Vein: No evidence of thrombus. Normal compressibility and flow on color Doppler imaging. Femoral Vein: No evidence of thrombus. Normal compressibility, respiratory phasicity and response to augmentation. Popliteal Vein: No evidence of thrombus. Normal compressibility, respiratory phasicity and response to augmentation. Calf Veins: Suboptimally visualized.  No evidence of thrombus. Superficial Great Saphenous Vein: No evidence of thrombus. Normal compressibility and flow on color Doppler imaging. Other Findings: Assessment for augmentation was mildly limited by patient pain. Soft tissue edema noted in the lower leg. IMPRESSION: 1. No evidence of  left lower extremity DVT. 2. Lower leg edema.  Mild limitations as above. Electronically Signed   By: Carey Bullocks M.D.   On: 06/05/2016 11:21    Procedures Procedures (including critical care time)  Medications Ordered in ED Medications  vancomycin (VANCOCIN) IVPB 1000 mg/200 mL premix (0 mg Intravenous Stopped 06/05/16 1350)  piperacillin-tazobactam (ZOSYN) IVPB 3.375 g (0 g Intravenous Stopped 06/05/16 1350)  piperacillin-tazobactam (ZOSYN) IVPB 3.375 g (0 g Intravenous Stopped 06/05/16 1112)  morphine 4 MG/ML injection 6 mg (6 mg Intravenous Given 06/05/16 1018)  vancomycin (VANCOCIN) IVPB 1000 mg/200 mL premix (  0 mg Intravenous Stopped 06/05/16 1239)  vancomycin (VANCOCIN) IVPB 1000 mg/200 mL premix (0 mg Intravenous Stopped 06/05/16 1348)     Initial Impression / Assessment and Plan / ED Course  I have reviewed the triage vital signs and the nursing notes.  Pertinent labs & imaging results that were available during my care of the patient were reviewed by me and considered in my medical decision making (see chart for details).     Patient here with apparent nonhealing cellulitis for 2 weeks. Possible osteomyelitis to left 4th toe. Patient has been seen by podiatry and followed by podiatry for several weeks however she states she's never had the increasing redness up her leg with associated pain. Patient is afebrile, hemodynamically stable, in no apparent distress. Patient has an apparent ulcer on left fourth toe nonhealing with increased redness up her left leg and medially to her leg. She has 2 scabbed over lesions with surrounding erythema. Areas are tender to palpation. She has been on clindamycin by podiatry and has failed outpatient treatment. Labs show increased lactic acid, increase WBC. Glucose is within normal limits at 105. She will need IV antibiotics here and likely admission to hospital. Pt also started on pain medications. Ultrasound is negative for DVT. Patient seen and  evaluate by Dr. Clayborne Dana who agree with assessment and plan.  Dr. Clayborne Dana spoke with admitting physician and Wonda Olds who agreed to admit patient for further evaluation and treatment.   Final Clinical Impressions(s) / ED Diagnoses   Final diagnoses:  Leg swelling  Cellulitis of left lower extremity    New Prescriptions New Prescriptions   No medications on file     [address] [place], [place] 06/05/16 1420    Marily Memos, MD 06/06/16 [phone]

## 2016-06-05 NOTE — ED Notes (Signed)
Patient transported to Ultrasound 

## 2016-06-05 NOTE — H&P (Signed)
History and Physical    [name] ZOX:[id number] DOB: [birth year] DOA: 06/05/2016  Referring MD/NP/PA: Dr. Marily Memos  PCP: Esperanza Richters, PA-C    Patient coming from: [facility], home   Chief Complaint: Worsening left lower extremity redness, swelling  HPI: Rita Watkins is a 61 y.o. female with medical history significant for hypertension, has had injury to her left toe just about new years 2018, has been seeing a doctor and was given abx (would change it every 2 weeks or so (clinda and keflex), so she would not develop resistance). Over last 2 weeks she has had progressive redness in left lower extremity coming almost up to her thigh area and with few areas of dried blood and scabbing every time she is taking a shower. No reports of fevers. No other complaints such as chest pain, shortness of breath or palpitations. No abdominal pain, nausea or vomiting.   ED Course: In ED, pt was hemodynamically stable. Blood work was notable for WBC count of 12.6, hgb 11.6, lactic acid 1.95. LE doppler was negative for DVT. She was started on vanco and zosyn per cellulitis order set.  Review of Systems:  Constitutional: Negative for fever, chills, diaphoresis, activity change, appetite change and fatigue.  HENT: Negative for ear pain, nosebleeds, congestion, facial swelling, rhinorrhea, neck pain, neck stiffness and ear discharge.   Eyes: Negative for pain, discharge, redness, itching and visual disturbance.  Respiratory: Negative for cough, choking, chest tightness, shortness of breath, wheezing and stridor.   Cardiovascular: Negative for chest pain, palpitations and leg swelling.  Gastrointestinal: Negative for abdominal distention.  Genitourinary: Negative for dysuria, urgency, frequency, hematuria, flank pain, decreased urine volume, difficulty urinating and dyspareunia.  Musculoskeletal: Negative for back pain, joint swelling, arthralgias and gait problem.  Neurological: Negative for dizziness,  tremors, seizures, syncope, facial asymmetry, speech difficulty, weakness, light-headedness, numbness and headaches.  Hematological: Negative for adenopathy. Does not bruise/bleed easily.  Psychiatric/Behavioral: Negative for hallucinations, behavioral problems, confusion, dysphoric mood, decreased concentration and agitation.   Past Medical History:  Diagnosis Date  . Allergy   . Anxiety   . Asthma   . COPD (chronic obstructive pulmonary disease) (HCC)   . Depression   . Hyperlipidemia   . Hypertension     Past Surgical History:  Procedure Laterality Date  . FOOT SURGERY     Left  . HIP PINNING     3 each hip  . SLIPPED CAPITAL FEMORAL EPIPHYSIS PINNING      Social history:  reports that she has been smoking Cigarettes.  She has a 35.00 pack-year smoking history. She has never used smokeless tobacco. She reports that she does not drink alcohol or use drugs.  Ambulation: ambulates without assistance at baseline   Allergies  Allergen Reactions  . Avelox [Moxifloxacin Hcl In Nacl] Anaphylaxis  . Terbinafine Hcl Swelling  . Zocor [Simvastatin - High Dose] Anaphylaxis  . Montelukast Sodium Other (See Comments)    Insomnia  . Pravastatin Other (See Comments)    Myalgias on     Family History  Problem Relation Age of Onset  . Hypertension Mother   . Hyperlipidemia Mother   . Hypertension Brother   . Asthma Brother     Prior to Admission medications   Medication Sig Start Date End Date Taking? Authorizing Provider  ALPRAZolam Prudy Feeler) 0.5 MG tablet Take 1 tablet (0.5 mg total) by mouth 2 (two) times daily as needed for anxiety. 04/05/16   Ramon Dredge Saguier, PA-C  cephALEXin (KEFLEX) 500  MG capsule Take 1 capsule (500 mg total) by mouth 3 (three) times daily. 03/21/16   Vivi Barrack, DPM  clindamycin (CLEOCIN) 300 MG capsule Take 1 capsule (300 mg total) by mouth 3 (three) times daily. 05/23/16   Vivi Barrack, DPM  gabapentin (NEURONTIN) 100 MG capsule Take 1  capsule (100 mg total) by mouth at bedtime. 04/11/16   Vivi Barrack, DPM  gabapentin (NEURONTIN) 300 MG capsule Take 1 capsule (300 mg total) by mouth at bedtime. 05/02/16   Vivi Barrack, DPM  HYDROcodone-acetaminophen (NORCO/VICODIN) 5-325 MG tablet Take 1 tablet by mouth every 4 (four) hours as needed. 03/21/16   Vivi Barrack, DPM  losartan-hydrochlorothiazide (HYZAAR) 100-12.5 MG tablet Take 1 tablet by mouth daily. 04/05/16   Esperanza Richters, PA-C    Physical Exam: Vitals:   06/05/16 1330 06/05/16 1345 06/05/16 1400 06/05/16 1514  BP: (!) 146/48  (!) 144/49 (!) 172/68  Pulse: 76 75 76 76  Resp: Temp:    98.1 F (36.7 C)  TempSrc:    Oral  SpO2: 99% 99% 100% 100%  Weight:      Height:        Constitutional: NAD, calm, comfortable Vitals:   06/05/16 1330 06/05/16 1345 06/05/16 1400 06/05/16 1514  BP: (!) 146/48  (!) 144/49 (!) 172/68  Pulse: 76 75 76 76  Resp: Temp:    98.1 F (36.7 C)  TempSrc:    Oral  SpO2: 99% 99% 100% 100%  Weight:      Height:       Eyes: PERRL, lids and conjunctivae normal ENMT: Mucous membranes are moist. Posterior pharynx clear of any exudate or lesions.Normal dentition.  Neck: normal, supple, no masses, no thyromegaly Respiratory: clear to auscultation bilaterally, no wheezing, no crackles. Normal respiratory effort. No accessory muscle use.  Cardiovascular: Regular rate and rhythm, no murmurs / rubs / gallops.2+ pedal pulses. No carotid bruits.  Abdomen: no tenderness, no masses palpated. No hepatosplenomegaly. Bowel sounds positive.  Musculoskeletal: no clubbing / cyanosis. No joint deformity upper and lower extremities. Good ROM, no contractures. Normal muscle tone.  Skin: extensive swelling and redness in LLE extending to the knee area with few area of dried blood and looks like even possible necrosis Neurologic: CN 2-12 grossly intact. Sensation intact, DTR normal. Strength 5/5 in all 4.  Psychiatric:  Normal judgment and insight. Alert and oriented x 3. Normal mood.    Labs on Admission: I have personally reviewed following labs and imaging studies  CBC:  Recent Labs Lab 06/05/16 0939  WBC 12.6*  NEUTROABS 9.5*  HGB 11.9*  HCT 36.2  MCV 91.2  PLT 399   Basic Metabolic Panel:  Recent Labs Lab 06/05/16 0939  NA 139  K 4.4  CL 102  CO2 28  GLUCOSE 103*  BUN 16  CREATININE 0.76  CALCIUM 9.2   GFR: Estimated Creatinine Clearance: 98 mL/min (by C-G formula based on SCr of 0.76 mg/dL). Liver Function Tests:  Recent Labs Lab 06/05/16 0939  AST 13*  ALT 11*  ALKPHOS 82  BILITOT 0.6  PROT 8.0  ALBUMIN 3.4*   No results for input(s): LIPASE, AMYLASE in the last 168 hours. No results for input(s): AMMONIA in the last 168 hours. Coagulation Profile: No results for input(s): INR, PROTIME in the last 168 hours. Cardiac Enzymes: No results for input(s): CKTOTAL, CKMB, CKMBINDEX, TROPONINI in the last 168 hours. BNP (last 3 results)  No results for input(s): PROBNP in the last 8760 hours. HbA1C: No results for input(s): HGBA1C in the last 72 hours. CBG:  Recent Labs Lab 06/05/16 0925  GLUCAP 105*   Lipid Profile: No results for input(s): CHOL, HDL, LDLCALC, TRIG, CHOLHDL, LDLDIRECT in the last 72 hours. Thyroid Function Tests: No results for input(s): TSH, T4TOTAL, FREET4, T3FREE, THYROIDAB in the last 72 hours. Anemia Panel: No results for input(s): VITAMINB12, FOLATE, FERRITIN, TIBC, IRON, RETICCTPCT in the last 72 hours. Urine analysis:    Component Value Date/Time   COLORURINE YELLOW 06/05/2016 1230   APPEARANCEUR CLEAR 06/05/2016 1230   LABSPEC 1.010 06/05/2016 1230   PHURINE 5.5 06/05/2016 1230   GLUCOSEU NEGATIVE 06/05/2016 1230   HGBUR MODERATE (A) 06/05/2016 1230   BILIRUBINUR NEGATIVE 06/05/2016 1230   KETONESUR NEGATIVE 06/05/2016 1230   PROTEINUR NEGATIVE 06/05/2016 1230   NITRITE NEGATIVE 06/05/2016 1230   LEUKOCYTESUR NEGATIVE 06/05/2016  1230   Sepsis Labs: (procalcitonin:4,lacticidven:4) )No results found for this or any previous visit (from the past 240 hour(s)).   Radiological Exams on Admission: US Venous Img Lower Unilateral Left Result Date: 06/05/2016  1. No evidence of left lower extremity DVT. 2. Lower leg edema.  Mild limitations as above. Electronically Signed   By: Carey Bullocks M.D.   On: 06/05/2016 11:21    Assessment/Plan  Active Problems: Left lower extremity cellulitis / leukocytosis - Toxic appearing left lower extremity cellulitis, failed outpatient treatment - Started vancomycin and Zosyn per cellulitis order set - Continue pain management efforts - Continue gabapentin for neuropathy - Lower extremity Doppler negative for DVT - WOC consulted    Essential hypertension - Continue Hyzaar - Ordered hydralazine 5 mg IV every 6 hours PRN for BP above 150/90   DVT prophylaxis: Lovenox subcutaneous Code Status: Full code Family Communication: No family at the bedside Disposition Plan: Admission to medical floor Consults called: WOC Admission status: Inpatient, patient has left lower extremity cellulitis, toxic appearing area she has failed outpatient treatment with by mouth antibiotics and now requires intravenous antibiotics for treatment.   Manson Passey MD [facility] Pager [phone]  If 7PM-7AM, please contact night-coverage [email or web address] Password [email or web address]  06/05/2016, 3:20 PM

## 2016-06-05 NOTE — ED Notes (Signed)
Pt states she cannot provide a UA at this time - would like to see PA, PA notified.

## 2016-06-06 DIAGNOSIS — L03032 Cellulitis of left toe: Secondary | ICD-10-CM

## 2016-06-06 DIAGNOSIS — L02612 Cutaneous abscess of left foot: Secondary | ICD-10-CM

## 2016-06-06 DIAGNOSIS — L03039 Cellulitis of unspecified toe: Secondary | ICD-10-CM

## 2016-06-06 DIAGNOSIS — L02619 Cutaneous abscess of unspecified foot: Secondary | ICD-10-CM

## 2016-06-06 DIAGNOSIS — L02419 Cutaneous abscess of limb, unspecified: Secondary | ICD-10-CM

## 2016-06-06 DIAGNOSIS — E784 Other hyperlipidemia: Secondary | ICD-10-CM

## 2016-06-06 DIAGNOSIS — L03119 Cellulitis of unspecified part of limb: Secondary | ICD-10-CM

## 2016-06-06 LAB — BASIC METABOLIC PANEL
Anion gap: 8 (ref 5–15)
BUN: 11 mg/dL (ref 6–20)
CHLORIDE: 103 mmol/L (ref 101–111)
CO2: 28 mmol/L (ref 22–32)
CREATININE: 0.63 mg/dL (ref 0.44–1.00)
Calcium: 8.8 mg/dL — ABNORMAL LOW (ref 8.9–10.3)
GFR calc Af Amer: >60 mL/min (ref >60)
GFR calc non Af Amer: >60 mL/min (ref >60)
Glucose, Bld: 92 mg/dL (ref 65–99)
Potassium: 3.5 mmol/L (ref 3.5–5.1)
Sodium: 139 mmol/L (ref 135–145)

## 2016-06-06 LAB — CBC
HEMATOCRIT: 32.4 % — AB (ref 36.0–46.0)
Hemoglobin: 10.6 g/dL — ABNORMAL LOW (ref 12.0–15.0)
MCH: 29.4 pg (ref 26.0–34.0)
MCHC: 32.7 g/dL (ref 30.0–36.0)
MCV: 90 fL (ref 78.0–100.0)
Platelets: 323 10*3/uL (ref 150–400)
RBC: 3.6 MIL/uL — ABNORMAL LOW (ref 3.87–5.11)
RDW: 13.1 % (ref 11.5–15.5)
WBC: 9.9 10*3/uL (ref 4.0–10.5)

## 2016-06-06 LAB — CK: CK TOTAL: 34 U/L — AB (ref 38–234)

## 2016-06-06 LAB — SEDIMENTATION RATE: Sed Rate: 84 mm/hr — ABNORMAL HIGH (ref 0–22)

## 2016-06-06 LAB — C-REACTIVE PROTEIN: CRP: 10.2 mg/dL — ABNORMAL HIGH (ref <1.0)

## 2016-06-06 LAB — GLUCOSE, CAPILLARY: Glucose-Capillary: 96 mg/dL (ref 65–99)

## 2016-06-06 LAB — HIV ANTIBODY (ROUTINE TESTING W REFLEX): HIV Screen 4th Generation wRfx: NONREACTIVE

## 2016-06-06 MED ORDER — LORAZEPAM 2 MG/ML IJ SOLN
2.0000 mg | Freq: Once | INTRAMUSCULAR | Status: DC
  Filled 2016-06-06: qty 1

## 2016-06-06 MED ORDER — METRONIDAZOLE IN NACL 5-0.79 MG/ML-% IV SOLN
500.0000 mg | Freq: Three times a day (TID) | INTRAVENOUS | Status: DC
  Administered 2016-06-06 – 2016-06-07 (×4): 500 mg via INTRAVENOUS
  Filled 2016-06-06 (×4): qty 100

## 2016-06-06 MED ORDER — DEXTROSE 5 % IV SOLN
2.0000 g | INTRAVENOUS | Status: DC
  Administered 2016-06-06 – 2016-06-09 (×4): 2 g via INTRAVENOUS
  Filled 2016-06-06 (×4): qty 2

## 2016-06-06 NOTE — Consult Note (Signed)
WOC Nurse wound consult note Reason for Consult:Patient will cellulitis and wounds on her medial left thigh Wound type:Infectious Pressure Injury POA: No Measurement: 30 cm area of erythema, warmth and induration, improved since onset of antibiotic therapy.  8cm x 5cm area of necrotic skin changes with eschar (black).  No drainage, no odor. Wound bed:As described above Drainage (amount, consistency, odor) As described above Periwound:As described above Dressing procedure/placement/frequency: I will implement conservative therapy today with twice daily saline dressings to the affected area.  MD POC included MRI today.  Will defer to medical management of cellulitis unless eschar opens, drainage increases or situation declines. WOC nursing team will not follow, but will remain available to this patient, the nursing and medical teams.  Please re-consult if needed. Thanks, Ladona Mow, MSN, RN, GNP, [place]  Pager# [phone]

## 2016-06-06 NOTE — Care Management Note (Signed)
Case Management Note  Patient Details  Name: ALVEENA TAIRA MRN: [id number] Date of Birth: 03-13-1955  Subjective/Objective:     Swelling of lower legs               Action/Plan: Date:  Jun 06, 2016 Chart reviewed for concurrent status and case management needs. Will continue to follow patient progress. Discharge Planning: following for needs Expected discharge date: [id number] Marcelle Smiling, BSN, [place], [place]   [phone] Expected Discharge Date:   (unknown)               Expected Discharge Plan:  Home/Self Care  In-House Referral:     Discharge planning Services     Post Acute Care Choice:    Choice offered to:     DME Arranged:    DME Agency:     HH Arranged:    HH Agency:     Status of Service:  In process, will continue to follow  If discussed at Long Length of Stay Meetings, dates discussed:    Additional Comments:  Golda Acre, RN 06/06/2016, 8:26 AM

## 2016-06-06 NOTE — Progress Notes (Addendum)
PROGRESS NOTE  Rita Watkins HYW:[id number] DOB: [birth year] DOA: 06/05/2016 PCP: Mackie Pai, PA-C  Brief History:  61 year old female with a history of hypertension, hyperlipidemia, and depression presented with one-week history of increasing pain, erythema, and edema on her left side. The patient first noticed a "scab" on the medial aspect of her left thigh on 05/31/2016 while in the shower. Over the next 2-3 days, she noticed additional scabs that began the form with associated increasing edema, erythema and pain. She denied fevers, chills. The patient denies any recent injury or trauma to her left lower extremity except when she dropped a bottle water onto her left foot in December 2017. The patient has 1 dog, but she denies any exotic pets. She states that the dog has not scratched or licked her in the leg. She denies any recent travels. She has not performed any yard work, and denies any recent injuries or trauma to the leg. She has not been camping or hiking recently.  Patient denies any surgeries or hardware in the leg.  Regarding her left foot, the patient has been following the podiatrist, Dr. Celesta Gentile since 03/21/2016. The patient developed a blister and infection on her left fourth toe for which Dr. Jacqualyn Posey has been following. She has been on and off antibiotics almost on a continuous basis since that period of time. She has been alternating between clindamycin and cephalexin. Most recently, the patient was started on clindamycin 300 mg every 8 hours on 05/23/2016 with which the patient endorses compliance. She feels that the erythema on her left foot and necrotic tissue on her left fourth toe has been improving.  In the ED, she was afebrile and hemodynamically stable with WBC 13.2. The patient was started on vancomycin and Zosyn.  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 She still has significant pain with weightbearing. Denies any fevers, chills, chest pain , nausea, vomiting, diarrhea, abdominal pain. No dysuria or hematuria.  Objective: Vitals:   06/05/16 1400 06/05/16 1514 06/05/16 2014 06/06/16 0427  BP: (!) 144/49 (!) 172/68 (!) 149/63 (!) 157/64  Pulse: 76 76 75 73  Resp: _0 Temp:  98.1 F (36.7 C) 98.6 F (37 C) 98.8 F (37.1 C)  TempSrc:  Oral Oral Oral  SpO2: 100% 100% 96% 92%  Weight:  113.9 kg (251 lb)  116 kg (255 lb 11.7 oz)  Height:  _1  (1.753 m)      Intake/Output Summary (Last  24 hours) at 06/06/16 1200 Last data filed at 06/06/16 6789  Gross per 24 hour  Intake             1970 ml  Output                0 ml  Net             1970 ml   Weight change:  Exam:   General:  Pt is alert, follows commands appropriately, not in acute distress  HEENT: No icterus, No thrush, No neck mass, [place]/AT  Cardiovascular: RRR, S1/S2, no rubs, no gallops  Respiratory: CTA  bilaterally, no wheezing, no crackles, no rhonchi  Abdomen: Soft/+BS, non tender, non distended, no guarding  Extremities:No lymphangitis, No petechiae, No rashes, no synovitis; induration of the medial aspect of the left infrapatellar lower extremity with erythema and scattered areas of eschar. Induration extends to the lateral aspect of infrapatellar area without any drainage. Dry gangrene of the distal phalanx of the left fourth toe with mild erythema without any drainage.   Data Reviewed: I have personally reviewed following labs and imaging studies Basic Metabolic Panel:  Recent Labs Lab 06/05/16 0939 06/05/16 1611 06/06/16 0558  NA 139  --  139  K 4.4  --  3.5  CL 102  --  103  CO2 28  --  28  GLUCOSE 103*  --  92  BUN 16  --  11  CREATININE 0.76 0.69 0.63  CALCIUM 9.2  --  8.8*   Liver Function Tests:  Recent Labs Lab 06/05/16 0939  AST 13*  ALT 11*  ALKPHOS 82  BILITOT 0.6  PROT 8.0  ALBUMIN 3.4*   No results for input(s): LIPASE, AMYLASE in the last 168 hours. No results for input(s): AMMONIA in the last 168 hours. Coagulation Profile: No results for input(s): INR, PROTIME in the last 168 hours. CBC:  Recent Labs Lab 06/05/16 0939 06/05/16 1611 06/06/16 1043  WBC 12.6* 13.2* 9.9  NEUTROABS 9.5*  --   --   HGB 11.9* 11.6* 10.6*  HCT 36.2 34.3* 32.4*  MCV 91.2 90.0 90.0  PLT 399 364 323   Cardiac Enzymes: No results for input(s): CKTOTAL, CKMB, CKMBINDEX, TROPONINI in the last 168 hours. BNP: Invalid input(s): POCBNP CBG:  Recent Labs Lab 06/05/16 0925 06/06/16 0738  GLUCAP 105* 96   HbA1C: No results for input(s): HGBA1C in the last 72 hours. Urine analysis:    Component Value Date/Time   COLORURINE YELLOW 06/05/2016 1230   APPEARANCEUR CLEAR 06/05/2016 1230   LABSPEC 1.010 06/05/2016 1230   PHURINE 5.5 06/05/2016 1230   GLUCOSEU NEGATIVE 06/05/2016 1230   HGBUR MODERATE (A) 06/05/2016 1230   BILIRUBINUR NEGATIVE 06/05/2016 [place] 06/05/2016 1230   PROTEINUR NEGATIVE 06/05/2016 1230   NITRITE NEGATIVE 06/05/2016 1230   LEUKOCYTESUR NEGATIVE 06/05/2016 1230   Sepsis Labs: _0 (procalcitonin:4,lacticidven:4) ) Recent Results (from the past 240 hour(s))  Blood Culture (routine x 2)     Status: None (Preliminary result)   Collection Time: 06/05/16  9:35 AM  Result Value Ref Range Status   Specimen Description BLOOD LEFT ARM  Final   Special Requests   Final    BOTTLES DRAWN AEROBIC AND ANAEROBIC Blood Culture adequate volume   Culture   Final    NO GROWTH < 24 HOURS Performed at [facility], [place] [address], [place], [place] [address]    Report Status PENDING  Incomplete  Blood  Culture (routine x 2)     Status: None (Preliminary result)   Collection Time: 06/05/16 10:20 AM  Result Value Ref Range Status   Specimen Description BLOOD LEFT ARM  Final   Special Requests   Final    BOTTLES DRAWN AEROBIC AND ANAEROBIC Blood Culture adequate volume   Culture   Final    NO GROWTH < 24 HOURS Performed at [facility], [address] [place], [place] [address]    Report Status PENDING  Incomplete     Scheduled Meds: . enoxaparin (LOVENOX) injection  0.5 mg/kg Subcutaneous Q24H  . gabapentin  300 mg Oral QHS  . losartan  100 mg Oral Daily   And  . hydrochlorothiazide  12.5 mg Oral Daily  . LORazepam  2 mg Intravenous Once   Continuous Infusions: . sodium chloride 75 mL/hr at 06/05/16 1855  . piperacillin-tazobactam (ZOSYN)  IV 3.375 g (06/06/16 1017)  . vancomycin Stopped (06/06/16 0014)    Procedures/Studies: US Venous Img Lower Unilateral Left  Result Date: 06/05/2016 CLINICAL DATA:  Left lower leg pain, erythema and swelling for 2 weeks. History of left foot infection. EXAM: LEFT LOWER EXTREMITY VENOUS DOPPLER ULTRASOUND TECHNIQUE: Gray-scale sonography with graded compression, as well as color Doppler and duplex ultrasound were performed to evaluate the lower  extremity deep venous systems from the level of the common femoral vein and including the common femoral, femoral, profunda femoral, popliteal and calf veins including the posterior tibial, peroneal and gastrocnemius veins when visible. The superficial great saphenous vein was also interrogated. Spectral Doppler was utilized to evaluate flow at rest and with distal augmentation maneuvers in the common femoral, femoral and popliteal veins. COMPARISON:  Foot radiographs 03/31/2016. FINDINGS: Contralateral Common Femoral Vein: Respiratory phasicity is normal and symmetric with the symptomatic side. No evidence of thrombus. Normal compressibility. Common Femoral Vein: No evidence of thrombus. Normal compressibility, respiratory phasicity and response to augmentation. Saphenofemoral Junction: No evidence of thrombus. Normal compressibility and flow on color Doppler imaging. Profunda Femoral Vein: No evidence of thrombus. Normal compressibility and flow on color Doppler imaging. Femoral Vein: No evidence of thrombus. Normal compressibility, respiratory phasicity and response to augmentation. Popliteal Vein: No evidence of thrombus. Normal compressibility, respiratory phasicity and response to augmentation. Calf Veins: Suboptimally visualized.  No evidence of thrombus. Superficial Great Saphenous Vein: No evidence of thrombus. Normal compressibility and flow on color Doppler imaging. Other Findings: Assessment for augmentation was mildly limited by patient pain. Soft tissue edema noted in the lower leg. IMPRESSION: 1. No evidence of left lower extremity DVT. 2. Lower leg edema.  Mild limitations as above. Electronically Signed   By: Richardean Sale M.D.   On: 06/05/2016 11:21    Adalid Beckmann, DO  [facility] Pager [phone]  If 7PM-7AM, please contact night-coverage [email or web address] Password [email or web address] 06/06/2016, 12:00 PM   LOS: 1 day

## 2016-06-07 ENCOUNTER — Inpatient Hospital Stay (HOSPITAL_COMMUNITY): Payer: Self-pay

## 2016-06-07 ENCOUNTER — Encounter (HOSPITAL_COMMUNITY): Payer: Self-pay | Admitting: Radiology

## 2016-06-07 ENCOUNTER — Telehealth (INDEPENDENT_AMBULATORY_CARE_PROVIDER_SITE_OTHER): Payer: Self-pay | Admitting: Orthopaedic Surgery

## 2016-06-07 DIAGNOSIS — Z888 Allergy status to other drugs, medicaments and biological substances status: Secondary | ICD-10-CM

## 2016-06-07 DIAGNOSIS — Z8349 Family history of other endocrine, nutritional and metabolic diseases: Secondary | ICD-10-CM

## 2016-06-07 DIAGNOSIS — I891 Lymphangitis: Secondary | ICD-10-CM | POA: Insufficient documentation

## 2016-06-07 DIAGNOSIS — A319 Mycobacterial infection, unspecified: Secondary | ICD-10-CM

## 2016-06-07 DIAGNOSIS — F17210 Nicotine dependence, cigarettes, uncomplicated: Secondary | ICD-10-CM

## 2016-06-07 DIAGNOSIS — Z8249 Family history of ischemic heart disease and other diseases of the circulatory system: Secondary | ICD-10-CM

## 2016-06-07 DIAGNOSIS — L03116 Cellulitis of left lower limb: Principal | ICD-10-CM

## 2016-06-07 DIAGNOSIS — Z881 Allergy status to other antibiotic agents status: Secondary | ICD-10-CM

## 2016-06-07 DIAGNOSIS — Z825 Family history of asthma and other chronic lower respiratory diseases: Secondary | ICD-10-CM

## 2016-06-07 HISTORY — DX: Lymphangitis: I89.1

## 2016-06-07 HISTORY — DX: Mycobacterial infection, unspecified: A31.9

## 2016-06-07 LAB — CBC
HCT: 31.4 % — ABNORMAL LOW (ref 36.0–46.0)
HEMOGLOBIN: 10.3 g/dL — AB (ref 12.0–15.0)
MCH: 29.4 pg (ref 26.0–34.0)
MCHC: 32.8 g/dL (ref 30.0–36.0)
MCV: 89.7 fL (ref 78.0–100.0)
Platelets: 311 10*3/uL (ref 150–400)
RBC: 3.5 MIL/uL — ABNORMAL LOW (ref 3.87–5.11)
RDW: 13.1 % (ref 11.5–15.5)
WBC: 9.1 10*3/uL (ref 4.0–10.5)

## 2016-06-07 LAB — BASIC METABOLIC PANEL
Anion gap: 8 (ref 5–15)
BUN: 12 mg/dL (ref 6–20)
CHLORIDE: 105 mmol/L (ref 101–111)
CO2: 27 mmol/L (ref 22–32)
CREATININE: 0.63 mg/dL (ref 0.44–1.00)
Calcium: 8.7 mg/dL — ABNORMAL LOW (ref 8.9–10.3)
GFR calc Af Amer: >60 mL/min (ref >60)
GFR calc non Af Amer: >60 mL/min (ref >60)
GLUCOSE: 102 mg/dL — AB (ref 65–99)
Potassium: 3.5 mmol/L (ref 3.5–5.1)
SODIUM: 140 mmol/L (ref 135–145)

## 2016-06-07 LAB — GLUCOSE, CAPILLARY: GLUCOSE-CAPILLARY: 101 mg/dL — AB (ref 65–99)

## 2016-06-07 MED ORDER — IOPAMIDOL (ISOVUE-300) INJECTION 61%
100.0000 mL | Freq: Once | INTRAVENOUS | Status: AC | PRN
  Administered 2016-06-07: 100 mL via INTRAVENOUS

## 2016-06-07 MED ORDER — IOPAMIDOL (ISOVUE-300) INJECTION 61%
INTRAVENOUS | Status: AC
  Administered 2016-06-07: 100 mL
  Filled 2016-06-07: qty 100

## 2016-06-07 NOTE — Progress Notes (Addendum)
PROGRESS NOTE    Rita Watkins   LHT:[id number]  DOB: 07/11/1955  DOA: 06/05/2016 PCP: Mackie Pai, PA-C   Brief Narrative:  61 year old female with a history of hypertension, hyperlipidemia, and depression presented with one-week history of increasing pain, erythema, and edema on her left side. The patient first noticed a "scab" on the medial aspect of her left thigh on 05/31/2016 while in the shower. Over the next 2-3 days, she noticed additional scabs that began the form with associated increasing edema, erythema and pain. She denied fevers, chills.   She has been seeing a podiatrist for left 4th toe infection and has been on different rounds of antibiotics since Feb. Most recently, the patient was started on clindamycin 300 mg every 8 hours on 05/23/2016 with which the patient endorses compliance. She feels that the erythema on her left foot and necrotic tissue on her left fourth toe has been improving.   Subjective: Feels leg has improved overall. No new complaints. Mild pain when walking but able to ambulate.   Assessment & Plan:   Principal Problem:   Cellulitis/ necrosis - left thigh - occurred while taking Clindamycin as out patient  - 06/05/16 [place] left leg--neg for DVT - CRP 10.2, ESR 84 CK 34,  - appears to be improving -started off with necrotic skin- area is indurated as well-  CT today shows large area of possible fat necrosis-see report below-  have consulted Ortho and ID for advice regarding treatment plan - Addendum: ortho recommending plastic surgery consult- I have spoken with Dr Marla Roe who will see her tomorrow - d/c Vanc- cont Flagyl and Rocephin   Active Problems: Left 4th toe infection - no osteomyelitis seen on CT - tip of toe appears necrotic - no signs of infection - under care of Dr Jacqualyn Posey  Moderate hemoglobinuria -with 6-30 RBCs - repeat tomorrow    HTN (hypertension) -continue losartan/HCTZ  Panic Attacks -continue xanax  prn  COPD -stable     DVT prophylaxis: Lovenox Code Status: full code Family Communication:  Disposition Plan: home when stable Consultants:   ortho Procedures:    Antimicrobials:  Anti-infectives    Start     Dose/Rate Route Frequency Ordered Stop   06/06/16 1530  metroNIDAZOLE (FLAGYL) IVPB 500 mg     500 mg 100 mL/hr over 60 Minutes Intravenous Every 8 hours 06/06/16 1520     06/06/16 1300  cefTRIAXone (ROCEPHIN) 2 g in dextrose 5 % 50 mL IVPB     2 g 100 mL/hr over 30 Minutes Intravenous Every 24 hours 06/06/16 1220     06/06/16 0000  vancomycin (VANCOCIN) IVPB 1000 mg/200 mL premix     1,000 mg 200 mL/hr over 60 Minutes Intravenous Every 12 hours 06/05/16 1742     06/05/16 2200  vancomycin (VANCOCIN) IVPB 1000 mg/200 mL premix  Status:  Discontinued     1,000 mg 200 mL/hr over 60 Minutes Intravenous Every 12 hours 06/05/16 1034 06/05/16 1543   06/05/16 1900  piperacillin-tazobactam (ZOSYN) IVPB 3.375 g  Status:  Discontinued     3.375 g 12.5 mL/hr over 240 Minutes Intravenous Every 8 hours 06/05/16 1034 06/05/16 1543   06/05/16 1800  piperacillin-tazobactam (ZOSYN) IVPB 3.375 g  Status:  Discontinued     3.375 g 12.5 mL/hr over 240 Minutes Intravenous Every 8 hours 06/05/16 1745 06/06/16 1220   06/05/16 1530  piperacillin-tazobactam (ZOSYN) IVPB 3.375 g  Status:  Discontinued     3.375 g 100 mL/hr over 30  Minutes Intravenous  Once 06/05/16 1521 06/05/16 1521   06/05/16 1530  vancomycin (VANCOCIN) IVPB 1000 mg/200 mL premix  Status:  Discontinued     1,000 mg 200 mL/hr over 60 Minutes Intravenous  Once 06/05/16 1521 06/05/16 1521   06/05/16 1230  vancomycin (VANCOCIN) IVPB 1000 mg/200 mL premix     1,000 mg 200 mL/hr over 60 Minutes Intravenous  Once 06/05/16 1118 06/05/16 1348   06/05/16 1130  vancomycin (VANCOCIN) IVPB 1000 mg/200 mL premix     1,000 mg 200 mL/hr over 60 Minutes Intravenous  Once 06/05/16 1118 06/05/16 1239   06/05/16 1030  vancomycin  (VANCOCIN) 2,000 mg in sodium chloride 0.9 % 500 mL IVPB  Status:  Discontinued     2,000 mg 250 mL/hr over 120 Minutes Intravenous  Once 06/05/16 1014 06/05/16 1118   06/05/16 1015  piperacillin-tazobactam (ZOSYN) IVPB 3.375 g  Status:  Discontinued     3.375 g 100 mL/hr over 30 Minutes Intravenous  Once 06/05/16 1014 06/05/16 1031   06/05/16 1000  piperacillin-tazobactam (ZOSYN) IVPB 3.375 g     3.375 g 100 mL/hr over 30 Minutes Intravenous  Once 06/05/16 0945 06/05/16 1112   06/05/16 1000  vancomycin (VANCOCIN) IVPB 1000 mg/200 mL premix  Status:  Discontinued     1,000 mg 200 mL/hr over 60 Minutes Intravenous  Once 06/05/16 0945 06/05/16 1031       Objective: Vitals:   06/06/16 2116 06/06/16 2143 06/07/16 0407 06/07/16 1408  BP: (!) 179/44 (!) 161/78 137/65 128/60  Pulse: 76  70 66  Resp: _0 Temp: 99.1 F (37.3 C)  98.6 F (37 C) 98.7 F (37.1 C)  TempSrc: Oral  Oral Oral  SpO2: 97%  99% 100%  Weight:      Height:        Intake/Output Summary (Last 24 hours) at 06/07/16 1417 Last data filed at 06/07/16 0900  Gross per 24 hour  Intake          2161.25 ml  Output                0 ml  Net          2161.25 ml   Filed Weights   06/05/16 0915 06/05/16 1514 06/06/16 0427  Weight: 113.4 kg (250 lb) 113.9 kg (251 lb) 116 kg (255 lb 11.7 oz)    Examination: General exam: Appears comfortable  HEENT: PERRLA, oral mucosa moist, no sclera icterus or thrush Respiratory system: Clear to auscultation. Respiratory effort normal. Cardiovascular system: S1 & S2 heard, RRR.  No murmurs  Gastrointestinal system: Abdomen soft, non-tender, nondistended. Normal bowel sound. No organomegaly Central nervous system: Alert and oriented. No focal neurological deficits. Extremities: no edema- see below in regards to toe Skin: necrosis of tip of left 4th toe, necrotic areas on inner left thigh with surrounding induration Psychiatry:  Mood & affect appropriate.     Data Reviewed:  I have personally reviewed following labs and imaging studies  CBC:  Recent Labs Lab 06/05/16 0939 06/05/16 1611 06/06/16 1043 06/07/16 0546  WBC 12.6* 13.2* 9.9 9.1  NEUTROABS 9.5*  --   --   --   HGB 11.9* 11.6* 10.6* 10.3*  HCT 36.2 34.3* 32.4* 31.4*  MCV 91.2 90.0 90.0 89.7  PLT 399 364 323 962   Basic Metabolic Panel:  Recent Labs Lab 06/05/16 0939 06/05/16 1611 06/06/16 0558 06/07/16 0546  NA 139  --  139 140  K 4.4  --  3.5 3.5  CL 102  --  103 105  CO2 28  --  28 27  GLUCOSE 103*  --  92 102*  BUN 16  --  11 12  CREATININE 0.76 0.69 0.63 0.63  CALCIUM 9.2  --  8.8* 8.7*   GFR: Estimated Creatinine Clearance: 99.1 mL/min (by C-G formula based on SCr of 0.63 mg/dL). Liver Function Tests:  Recent Labs Lab 06/05/16 0939  AST 13*  ALT 11*  ALKPHOS 82  BILITOT 0.6  PROT 8.0  ALBUMIN 3.4*   No results for input(s): LIPASE, AMYLASE in the last 168 hours. No results for input(s): AMMONIA in the last 168 hours. Coagulation Profile: No results for input(s): INR, PROTIME in the last 168 hours. Cardiac Enzymes:  Recent Labs Lab 06/06/16 1043  CKTOTAL 34*   BNP (last 3 results) No results for input(s): PROBNP in the last 8760 hours. HbA1C: No results for input(s): HGBA1C in the last 72 hours. CBG:  Recent Labs Lab 06/05/16 0925 06/06/16 0738 06/07/16 0739  GLUCAP 105* 96 101*   Lipid Profile: No results for input(s): CHOL, HDL, LDLCALC, TRIG, CHOLHDL, LDLDIRECT in the last 72 hours. Thyroid Function Tests: No results for input(s): TSH, T4TOTAL, FREET4, T3FREE, THYROIDAB in the last 72 hours. Anemia Panel: No results for input(s): VITAMINB12, FOLATE, FERRITIN, TIBC, IRON, RETICCTPCT in the last 72 hours. Urine analysis:    Component Value Date/Time   COLORURINE YELLOW 06/05/2016 1230   APPEARANCEUR CLEAR 06/05/2016 1230   LABSPEC 1.010 06/05/2016 1230   PHURINE 5.5 06/05/2016 1230   GLUCOSEU NEGATIVE 06/05/2016 1230   HGBUR MODERATE (A)  06/05/2016 1230   BILIRUBINUR NEGATIVE 06/05/2016 [place] 06/05/2016 1230   PROTEINUR NEGATIVE 06/05/2016 1230   NITRITE NEGATIVE 06/05/2016 1230   LEUKOCYTESUR NEGATIVE 06/05/2016 1230   Sepsis Labs: _0 (procalcitonin:4,lacticidven:4) ) Recent Results (from the past 240 hour(s))  Blood Culture (routine x 2)     Status: None (Preliminary result)   Collection Time: 06/05/16  9:35 AM  Result Value Ref Range Status   Specimen Description BLOOD LEFT ARM  Final   Special Requests   Final    BOTTLES DRAWN AEROBIC AND ANAEROBIC Blood Culture adequate volume   Culture   Final    NO GROWTH 2 DAYS Performed at [facility], [place] [address] [place], [place] [address]    Report Status PENDING  Incomplete  Blood Culture (routine x 2)     Status: None (Preliminary result)   Collection Time: 06/05/16 10:20 AM  Result Value Ref Range Status   Specimen Description BLOOD LEFT ARM  Final   Special Requests   Final    BOTTLES DRAWN AEROBIC AND ANAEROBIC Blood Culture adequate volume   Culture   Final    NO GROWTH 2 DAYS Performed at [facility], [place] [address], [place], [place] [address]    Report Status PENDING  Incomplete         Radiology Studies: Ct Tibia Fibula Left W Contrast  Result Date: 06/07/2016 CLINICAL DATA:  Dropped a 5 gallon water jug on left foot in Jan. Trouble with foot and leg since. EXAM: CT OF THE LOWER LEFT TIBIA AND FIBULA WITH CONTRAST CT OF THE LOWER LEFT FOOT WITH CONTRAST TECHNIQUE: Multidetector CT imaging of the lower left tibia and fibula was performed according to the standard protocol following intravenous contrast administration. Multidetector CT imaging of the lower left foot was performed according to the standard protocol following  intravenous contrast administration. COMPARISON:  None. CONTRAST:  142m ISOVUE-300 IOPAMIDOL (ISOVUE-300) INJECTION 61%, 1079mISOVUE-300 IOPAMIDOL (ISOVUE-300) INJECTION 61% FINDINGS:  Bones/Joint/Cartilage No acute fracture or dislocation of the left tibia or fibula. No acute fracture or dislocation of the left foot. Hallux valgus of the left foot. Prior osteotomies of the second and fifth metacarpal necks. Prior osteotomy of the calcaneus. Normal alignment. No joint effusion. 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 Subchondral sclerosis and cystic changes in the medial femoral condyle and medial tibial plateau. Tricompartmental marginal osteophytes. Moderate osteoarthritis of the tibiotalar joint. Intact ankle mortise. Mild osteoarthritis of the first MTP joint. Mild osteoarthritis of the first IP joint. Mild osteoarthritis of the second, third and fourth PIP and DIP joint. No aggressive lytic or sclerotic osseous lesion. Tiny plantar calcaneal spur. Ligaments Ligaments are suboptimally evaluated by CT. Muscles and Tendons The muscles are normal. No muscle atrophy. No intramuscular hematoma or fluid collection. Soft tissue 5.1 x 2.4 cm fat density mass in the anterior subcutaneous fat of the proximal left lower leg with an adjacent coarse calcification likely reflecting an area of fat necrosis versus less likely lipoma. Mild soft tissue edema in the right lower leg and foot. No focal fluid collection or hematoma. IMPRESSION: 1.  No acute osseous injury of the left tibia or fibula. 2.  No acute osseous injury of the left foot. 3. No soft tissue hematoma. 4. 5.1 x 2.4 cm fat density mass in the anterior subcutaneous fat of the proximal left lower leg with an adjacent coarse calcification likely reflecting an area of fat necrosis versus less likely lipoma. 5. 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 6. 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 Electronically Signed   By: HeKathreen Devoid On: 06/07/2016 09:43   Ct Foot Left W Contrast  Result Date: 06/07/2016 CLINICAL DATA:   Dropped a 5 gallon water jug on left foot in Jan. Trouble with foot and leg since. EXAM: CT OF THE LOWER LEFT TIBIA AND FIBULA WITH CONTRAST CT OF THE LOWER LEFT FOOT WITH CONTRAST TECHNIQUE: Multidetector CT imaging of the lower left tibia and fibula was performed according to the standard protocol following intravenous contrast administration. Multidetector CT imaging of the lower left foot was performed according to the standard protocol following intravenous contrast administration. COMPARISON:  None. CONTRAST:  10072mSOVUE-300 IOPAMIDOL (ISOVUE-300) INJECTION 61%, 100m65mOVUE-300 IOPAMIDOL (ISOVUE-300) INJECTION 61% FINDINGS: Bones/Joint/Cartilage No acute fracture or dislocation of the left tibia or fibula. No acute fracture or dislocation of the left foot. Hallux valgus of the left foot. Prior osteotomies of the second and fifth metacarpal necks. Prior osteotomy of the calcaneus. Normal alignment. No joint effusion. 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 Subchondral sclerosis and cystic changes in the medial femoral condyle and medial tibial plateau. Tricompartmental marginal osteophytes. Moderate osteoarthritis of the tibiotalar joint. Intact ankle mortise. Mild osteoarthritis of the first MTP joint. Mild osteoarthritis of the first IP joint. Mild osteoarthritis of the second, third and fourth PIP and DIP joint. No aggressive lytic or sclerotic osseous lesion. Tiny plantar calcaneal spur. Ligaments Ligaments are suboptimally evaluated by CT. Muscles and Tendons The muscles are normal. No muscle atrophy. No intramuscular hematoma or fluid collection. Soft tissue 5.1 x 2.4 cm fat density mass in the anterior subcutaneous fat of the proximal left lower leg with an adjacent  coarse calcification likely reflecting an area of fat necrosis versus less likely lipoma. Mild soft tissue edema in the right lower leg  and foot. No focal fluid collection or hematoma. IMPRESSION: 1.  No acute osseous injury of the left tibia or fibula. 2.  No acute osseous injury of the left foot. 3. No soft tissue hematoma. 4. 5.1 x 2.4 cm fat density mass in the anterior subcutaneous fat of the proximal left lower leg with an adjacent coarse calcification likely reflecting an area of fat necrosis versus less likely lipoma. 5. 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 6. 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 Electronically Signed   By: Kathreen Devoid   On: 06/07/2016 09:43      Scheduled Meds: . enoxaparin (LOVENOX) injection  0.5 mg/kg Subcutaneous Q24H  . gabapentin  300 mg Oral QHS  . losartan  100 mg Oral Daily   And  . hydrochlorothiazide  12.5 mg Oral Daily  . LORazepam  2 mg Intravenous Once   Continuous Infusions: . sodium chloride 75 mL/hr at 06/07/16 1332  . cefTRIAXone (ROCEPHIN)  IV 2 g (06/07/16 1329)  . metronidazole Stopped (06/07/16 0829)  . vancomycin Stopped (06/07/16 1309)     LOS: 2 days    Time spent in minutes: 35    Debbe Odea, MD [facility] Pager: [email or web address] Password [email or web address] 06/07/2016, 2:17 PM

## 2016-06-07 NOTE — Progress Notes (Signed)
Changed wet to dry dressing to L leg per order

## 2016-06-07 NOTE — Progress Notes (Signed)
Chaplain stopped in to visit with this patient.  Patient sitting at bedside.  Patient appreciative of Chaplain presence and states she is [place].  She likes the [employer] Version of the bible.  Patient shared with Chaplain that she is grieving the loss of her husband who didn't wake up one morning four years ago.  Patient also states that she has never really been in the hospital for anything.  Patient asked for prayer.  Chaplain and patient prayed.    Chaplain provided ministry of presence for this patient.    06/07/16 1534  Clinical Encounter Type  Visited With Patient  Visit Type Initial;Spiritual support;Social support  Spiritual Encounters  Spiritual Needs Prayer (Patient enjoys prayer)  Stress Factors  Patient Stress Factors Loss;Health changes

## 2016-06-07 NOTE — Consult Note (Signed)
Date of Admission:  06/05/2016  Date of Consult:  06/07/2016  Reason for Consult: Necrotic appearing soft tissue infection following chronic lesion in 4th toe also with necrotic appearance  Referring Physician: Dr. Wynelle Cleveland   HPI: Rita Watkins is an 61 y.o. female with hx of frequenting nail salons for pedicures including just before Christmas of 2017. A few days after Xmas, shortly before New Year's Eve she dropped a large water dispensing jug  of drinking water on her toe. She was wearing sandles at the time so the object directly impacted her bare feet. She did not notice any bleeding initially. HOwever she then developed blistering of the skin of her 4th toe which then scabbed. The toe became quite swollen and tender. She had been treating it with only topical antimicrobials before finally being seen by Dr. Jacqualyn Posey with Podiatry on February the 13th. Dr. Jacqualyn Posey observed ulceration of 4th toe. He obtained plain films which did not show osteo or fracture. She was given surgical shoe, loal wound care and keflex. Pt had experienced edema and was rx with lasix by PCP. Her wounds improved except for 4th digit which continued to be painful on followup visit in March with Podiatry. Pt given gabapentin for pain. Patient was then seen in late March and started on oral clindamycin. She began draining clear material from 4th digit though tenderness improved by exam in mid April. Local care and antibiotics, patient believes she was no on cephalexin again. Roughly 2 weeks prior to admission she developed area of redness on her thigh that progressed over ensuing 2 weeks with black center that expanded and area that was exquisitely tender. She was admitted to [facility] on the 30th and she was placed on vancomycin, zosyn then vancomycin, ceftriaxone and metronidazole. She had doppler's negative for DVT on admission CT-scan of tibia/fibula and foot showed: 5.1 x 2.4 cm fat density mass in the anterior subcutaneous  fat o the proximal left lower leg. I do not believe that the area in medial thigh was imaged here though.   We were consulted to assist in workup of this non resolving toe lesions with black necrotic appearance along with new area in her thigh. She states that erythema has responded to IV antibiotics but both toe and thigh remain exquisitedly tender in area of necrotic material.  She is smoker x 20 years but down to pack per day. She has dog at home which has not scratched or licked skin near the sites involved. She has no birds, fish, cats. She has not been in the ocean or fresh water though as mentioned has key element of having had pedicure just a few days before this pathology began.   She has not been on immunosuppressive drugs. She does not drink alcohol or use drugs besided tobacco. Neither she nor her brother or mother or father (deceased) or other close relatives have hx of Auto immune disorder.      Past Medical History:  Diagnosis Date  . Allergy   . Anxiety   . Asthma   . COPD (chronic obstructive pulmonary disease) ([place])   . Depression   . Hyperlipidemia   . Hypertension   . Lymphangitis 06/07/2016  . 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 FOOT SURGERY     Left  . HIP PINNING     3 each hip  . SLIPPED CAPITAL FEMORAL EPIPHYSIS PINNING  Social History:  reports that she has been smoking Cigarettes.  She has a 35.00 pack-year smoking history. She has never used smokeless tobacco. She reports that she does not drink alcohol or use drugs.   Family History  Problem Relation Age of Onset  . Hypertension Mother   . Hyperlipidemia Mother   . Hypertension Brother   . Asthma Brother     Allergies  Allergen Reactions  . Avelox [Moxifloxacin Hcl In Nacl] Anaphylaxis  . Terbinafine Hcl Swelling  . Zocor [Simvastatin - High Dose] Anaphylaxis  . Montelukast Sodium Other (See Comments)    Insomnia  .  Pravastatin Other (See Comments)    Myalgias on 32m     Medications: I have reviewed patients current medications as documented in Epic Anti-infectives    Start     Dose/Rate Route Frequency Ordered Stop   06/06/16 1530  metroNIDAZOLE (FLAGYL) IVPB 500 mg  Status:  Discontinued     500 mg 100 mL/hr over 60 Minutes Intravenous Every 8 hours 06/06/16 1520 06/07/16 1821   06/06/16 1300  cefTRIAXone (ROCEPHIN) 2 g in dextrose 5 % 50 mL IVPB     2 g 100 mL/hr over 30 Minutes Intravenous Every 24 hours 06/06/16 1220     06/06/16 0000  vancomycin (VANCOCIN) IVPB 1000 mg/200 mL premix  Status:  Discontinued     1,000 mg 200 mL/hr over 60 Minutes Intravenous Every 12 hours 06/05/16 1742 06/07/16 1437   06/05/16 2200  vancomycin (VANCOCIN) IVPB 1000 mg/200 mL premix  Status:  Discontinued     1,000 mg 200 mL/hr over 60 Minutes Intravenous Every 12 hours 06/05/16 1034 06/05/16 1543   06/05/16 1900  piperacillin-tazobactam (ZOSYN) IVPB 3.375 g  Status:  Discontinued     3.375 g 12.5 mL/hr over 240 Minutes Intravenous Every 8 hours 06/05/16 1034 06/05/16 1543   06/05/16 1800  piperacillin-tazobactam (ZOSYN) IVPB 3.375 g  Status:  Discontinued     3.375 g 12.5 mL/hr over 240 Minutes Intravenous Every 8 hours 06/05/16 1745 06/06/16 1220   06/05/16 1530  piperacillin-tazobactam (ZOSYN) IVPB 3.375 g  Status:  Discontinued     3.375 g 100 mL/hr over 30 Minutes Intravenous  Once 06/05/16 1521 06/05/16 1521   06/05/16 1530  vancomycin (VANCOCIN) IVPB 1000 mg/200 mL premix  Status:  Discontinued     1,000 mg 200 mL/hr over 60 Minutes Intravenous  Once 06/05/16 1521 06/05/16 1521   06/05/16 1230  vancomycin (VANCOCIN) IVPB 1000 mg/200 mL premix     1,000 mg 200 mL/hr over 60 Minutes Intravenous  Once 06/05/16 1118 06/05/16 1348   06/05/16 1130  vancomycin (VANCOCIN) IVPB 1000 mg/200 mL premix     1,000 mg 200 mL/hr over 60 Minutes Intravenous  Once 06/05/16 1118 06/05/16 1239   06/05/16 1030   vancomycin (VANCOCIN) 2,000 mg in sodium chloride 0.9 % 500 mL IVPB  Status:  Discontinued     2,000 mg 250 mL/hr over 120 Minutes Intravenous  Once 06/05/16 1014 06/05/16 1118   06/05/16 1015  piperacillin-tazobactam (ZOSYN) IVPB 3.375 g  Status:  Discontinued     3.375 g 100 mL/hr over 30 Minutes Intravenous  Once 06/05/16 1014 06/05/16 1031   06/05/16 1000  piperacillin-tazobactam (ZOSYN) IVPB 3.375 g     3.375 g 100 mL/hr over 30 Minutes Intravenous  Once 06/05/16 0945 06/05/16 1112   06/05/16 1000  vancomycin (VANCOCIN) IVPB 1000 mg/200 mL premix  Status:  Discontinued     1,000 mg 200 mL/hr  over 60 Minutes Intravenous  Once 06/05/16 0945 06/05/16 1031         ROS:  as in HPI otherwise remainder of 12 point Review of Systems is negative   Blood pressure 128/60, pulse 66, temperature 98.7 F (37.1 C), temperature source Oral, resp. rate 20, height 5' 9" (1.753 m), weight 255 lb 11.7 oz (116 kg), SpO2 100 %. General: Alert and awake, oriented x3, not in any acute distress. HEENT: anicteric sclera,  EOMI, Pulmonary: cno wheezing, or resp distress Gastrointestinal: soft nontender, nondistended, normal bowel sounds, Musculoskeletal/skin   Foot and toe 06/05/16:      Toe 06/07/16:     Leg 06/05/16:      Leg 06/07/16:         Neuro: nonfocal, strength and sensation intact   Results for orders placed or performed during the hospital encounter of 06/05/16 (from the past 48 hour(s))  Basic metabolic panel     Status: Abnormal   Collection Time: 06/06/16  5:58 AM  Result Value Ref Range   Sodium 139 135 - 145 mmol/L   Potassium 3.5 3.5 - 5.1 mmol/L   Chloride 103 101 - 111 mmol/L   CO2 28 22 - 32 mmol/L   Glucose, Bld 92 65 - 99 mg/dL   BUN 11 6 - 20 mg/dL   Creatinine, Ser 0.63 0.44 - 1.00 mg/dL   Calcium 8.8 (L) 8.9 - 10.3 mg/dL   GFR calc non Af Amer >60 >60 mL/min   GFR calc Af Amer >60 >60 mL/min    Comment: (NOTE) The eGFR has been calculated using  the CKD EPI equation. This calculation has not been validated in all clinical situations. eGFR's persistently <60 mL/min signify possible Chronic Kidney Disease.    Anion gap 8 5 - 15  Glucose, capillary     Status: None   Collection Time: 06/06/16  7:38 AM  Result Value Ref Range   Glucose-Capillary 96 65 - 99 mg/dL  CBC     Status: Abnormal   Collection Time: 06/06/16 10:43 AM  Result Value Ref Range   WBC 9.9 4.0 - 10.5 K/uL   RBC 3.60 (L) 3.87 - 5.11 MIL/uL   Hemoglobin 10.6 (L) 12.0 - 15.0 g/dL   HCT 32.4 (L) 36.0 - 46.0 %   MCV 90.0 78.0 - 100.0 fL   MCH 29.4 26.0 - 34.0 pg   MCHC 32.7 30.0 - 36.0 g/dL   RDW 13.1 11.5 - 15.5 %   Platelets 323 150 - 400 K/uL  C-reactive protein     Status: Abnormal   Collection Time: 06/06/16 10:43 AM  Result Value Ref Range   CRP 10.2 (H) <1.0 mg/dL    Comment: Performed at [facility], [place] [address] [place], [place] [address]  Sedimentation rate     Status: Abnormal   Collection Time: 06/06/16 10:43 AM  Result Value Ref Range   Sed Rate 84 (H) 0 - 22 mm/hr  CK     Status: Abnormal   Collection Time: 06/06/16 10:43 AM  Result Value Ref Range   Total CK 34 (L) 38 - 234 U/L  CBC     Status: Abnormal   Collection Time: 06/07/16  5:46 AM  Result Value Ref Range   WBC 9.1 4.0 - 10.5 K/uL   RBC 3.50 (L) 3.87 - 5.11 MIL/uL   Hemoglobin 10.3 (L) 12.0 - 15.0 g/dL   HCT 31.4 (L) 36.0 - 46.0 %   MCV 89.7 78.0 -  100.0 fL   MCH 29.4 26.0 - 34.0 pg   MCHC 32.8 30.0 - 36.0 g/dL   RDW 13.1 11.5 - 15.5 %   Platelets 311 150 - 400 K/uL  Basic metabolic panel     Status: Abnormal   Collection Time: 06/07/16  5:46 AM  Result Value Ref Range   Sodium 140 135 - 145 mmol/L   Potassium 3.5 3.5 - 5.1 mmol/L   Chloride 105 101 - 111 mmol/L   CO2 27 22 - 32 mmol/L   Glucose, Bld 102 (H) 65 - 99 mg/dL   BUN 12 6 - 20 mg/dL   Creatinine, Ser 0.63 0.44 - 1.00 mg/dL   Calcium 8.7 (L) 8.9 - 10.3 mg/dL   GFR calc non Af Amer >60 >60 mL/min    GFR calc Af Amer >60 >60 mL/min    Comment: (NOTE) The eGFR has been calculated using the CKD EPI equation. This calculation has not been validated in all clinical situations. eGFR's persistently <60 mL/min signify possible Chronic Kidney Disease.    Anion gap 8 5 - 15  Glucose, capillary     Status: Abnormal   Collection Time: 06/07/16  7:39 AM  Result Value Ref Range   Glucose-Capillary 101 (H) 65 - 99 mg/dL   _0 (sdes,specrequest,cult,reptstatus)   ) Recent Results (from the past 720 hour(s))  Blood Culture (routine x 2)     Status: None (Preliminary result)   Collection Time: 06/05/16  9:35 AM  Result Value Ref Range Status   Specimen Description BLOOD LEFT ARM  Final   Special Requests   Final    BOTTLES DRAWN AEROBIC AND ANAEROBIC Blood Culture adequate volume   Culture   Final    NO GROWTH 2 DAYS Performed at [facility], [place] [address] [place], [place] [address]    Report Status PENDING  Incomplete  Blood Culture (routine x 2)     Status: None (Preliminary result)   Collection Time: 06/05/16 10:20 AM  Result Value Ref Range Status   Specimen Description BLOOD LEFT ARM  Final   Special Requests   Final    BOTTLES DRAWN AEROBIC AND ANAEROBIC Blood Culture adequate volume   Culture   Final    NO GROWTH 2 DAYS Performed at [facility], [place] [address], [place], [place] [address]    Report Status PENDING  Incomplete     Impression/Recommendation  Principal Problem:   Cellulitis Active Problems:   Hyperlipidemia   HTN (hypertension)   Mycobacterium infection, atypical   Lymphangitis   Rita Watkins is a 61 y.o. female with hx of frequent pedicures here in [facility] sp last one shortly before Xmas holidays, sp trauma to foot with large fresh water dispending jug, ensuing swollen tender toe and feet which have progressed in ensuring 5 months despite treatment with keflex and clindamycin and local wound care by podiatry. Patient has lost nail  and toe is still quite tender and necrotic appearing. She now has likely lymphangitic spread of this infection to her lower calf and thigh where there are areas of intensely painful necrosis.   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 There is a pertinent CID article by Dr. Lorenda Cahill and colleagues from [place] and [place] in [place] on outbreaks for ex in [place] and [facility]   [email or web address]  These tend to be due  unfortunately in [place] to Mycobacterium abscessus (which can frequently by MDR organism) but can also be causd by M fortuitum, Chelonae, mucigenicum  Certainly a fungal infection can present this was and the patient has a classic "sporotrichoid" lymphangitic patter to her infection though the wrong history for this.  If there had been soil, gardening exposure it could be more strongly considered along with Nocardia  Auto-immune pathology such as Pyoderma gangrenosum and drug induced vasculitis (for ex levimasole (from cocaine) could be considered but seem less likely.  Pathology from smoking also possible  TO get DIAGNOSIS we need a   BIOPSY Of AFFECTED TISSUE  I would recommend biopsy of necrotic tissue and adjacent skin and   SEND FOR   AFB STAIN AND CULTURE (which we can send for SENSIS)  NOCARDIA will likely grow on former media but could also be looked for on Buffered Charcoal [place]  FUNGAL STAIN and CULTURE   Bacterial culture  PATHOLOGY  ( I can also send specimen from paraffin fixed specimen to [facility] for PCR for Mycobacteria and Fungi, Nocardia but we need the cultures for SENSI data)  I would also strongly recommend I and D of any abscessed material including the area of "fat necrosis:" which is more likely Mycobacterial infection.  SURGICAL CONTROL is critical to these  infection  PLEASE SEND AS MUCH ACTUAL TISSUE, FAT, NECROTIC MATERIAL PUS in STERILE PLASTIC CONTAINERS TO MICRO LAB AS POSSIBLE (Swabs will not be very sensitive)  I have communicated recs to Dr. Wynelle Cleveland and plans being made for Plastics to see pt  Consider MRI of tib-fibular to femur to look for deeper infection more proximally   DC vancomycin and flagyl. I will keep CTX on board in case strep species playing a role in superinfection with bacteria   I spent greater than 80 minutes with the patient including greater than 50% of time in face to face counsel of the patient re her toe infection with lymphangitic spread that is indolent and highly likely to be NTM  and in coordination of her care with primary team.   06/07/2016, 6:21 PM   Thank you so much for this interesting consult  [place] [facility] [place] (pager) [phone] (office) 06/07/2016, 6:21 PM  Rhina Br[place] 06/07/2016, 6:21 PM

## 2016-06-07 NOTE — Consult Note (Signed)
ORTHOPAEDIC CONSULTATION  REQUESTING PHYSICIAN: Calvert Cantor, MD  Chief Complaint: Left leg eschars  HPI: Rita Watkins is a 61 y.o. female who presents with left leg eschars on the medial aspect of the leg for about a few days.  She denies any trauma to the area recently.  She does endorse cellulitis, pain, swelling but denies constitutional sxs.  She reports improvement in pain since starting IV abx.  Ortho consulted  Past Medical History:  Diagnosis Date  . Allergy   . Anxiety   . Asthma   . COPD (chronic obstructive pulmonary disease) ([facility])   . Depression   . Hyperlipidemia   . Hypertension    Past Surgical History:  Procedure Laterality Date  . FOOT SURGERY     Left  . HIP PINNING     3 each hip  . SLIPPED CAPITAL FEMORAL EPIPHYSIS PINNING     Social History   Social History  . Marital status: Widowed    Spouse name: N/A  . Number of children: N/A  . 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 Smoking status: Current Every Day Smoker    Packs/day: 1.00    Years: 35.00    Types: Cigarettes  . Smokeless tobacco: Never Used  . Alcohol use No  . Drug use: No  . Sexual activity: No   Other Topics Concern  . None   Social History Narrative   Does not exercise.   Family History  Problem Relation Age of Onset  . Hypertension Mother   . Hyperlipidemia Mother   . Hypertension Brother   . Asthma Brother    - negative except otherwise stated in the family history section Allergies  Allergen Reactions  . Avelox [Moxifloxacin Hcl In Nacl] Anaphylaxis  . Terbinafine Hcl Swelling  . Zocor [Simvastatin - High Dose] Anaphylaxis  . Montelukast Sodium Other (See Comments)    Insomnia  . Pravastatin Other (See Comments)    Myalgias on    Prior to Admission medications   Medication Sig Start Date End Date Taking? Authorizing Provider  acetaminophen (TYLENOL) 325 MG tablet Take 650 mg by  mouth every 6 (six) hours as needed.   Yes Historical Provider, MD  ALPRAZolam Prudy Feeler) 0.5 MG tablet Take 1 tablet (0.5 mg total) by mouth 2 (two) times daily as needed for anxiety. 04/05/16  Yes Edward Saguier, PA-C  gabapentin (NEURONTIN) 300 MG capsule Take 1 capsule (300 mg total) by mouth at bedtime. 05/02/16  Yes Vivi Barrack, DPM  ibuprofen (ADVIL,MOTRIN) 200 MG tablet Take 800 mg by mouth every 6 (six) hours as needed.   Yes Historical Provider, MD  losartan-hydrochlorothiazide (HYZAAR) 100-12.5 MG tablet Take 1 tablet by mouth daily. 04/05/16  Yes Edward Saguier, PA-C  vitamin C (ASCORBIC ACID) 500 MG tablet Take 500 mg by mouth daily.   Yes Historical Provider, MD  cephALEXin (KEFLEX) 500 MG capsule Take 1 capsule (500 mg total) by mouth 3 (three) times daily. Patient not taking: Reported on 06/05/2016 03/21/16   Vivi Barrack, DPM  clindamycin (CLEOCIN) 300 MG capsule Take 1 capsule (300 mg total) by mouth 3 (three) times daily. 05/23/16   Vivi Barrack, DPM  gabapentin (NEURONTIN) 100 MG capsule Take 1 capsule (100 mg total) by mouth at bedtime. Patient not taking: Reported on 06/05/2016 04/11/16   Vivi Barrack, DPM  HYDROcodone-acetaminophen (NORCO/VICODIN) 5-325 MG tablet Take 1 tablet  by mouth every 4 (four) hours as needed. Patient not taking: Reported on 06/05/2016 03/21/16   Vivi Barrack, DPM   Ct Tibia Fibula Left W Contrast  Result Date: 06/07/2016 CLINICAL DATA:  Dropped a 5 gallon water jug on left foot in Jan. Trouble with foot and leg since. EXAM: CT OF THE LOWER LEFT TIBIA AND FIBULA WITH CONTRAST CT OF THE LOWER LEFT FOOT WITH CONTRAST TECHNIQUE: Multidetector CT imaging of the lower left tibia and fibula was performed according to the standard protocol following intravenous contrast administration. Multidetector CT imaging of the lower left foot was performed according to the standard protocol following intravenous contrast administration. COMPARISON:  None.  CONTRAST:  ISOVUE-300 IOPAMIDOL (ISOVUE-300) INJECTION 61%, ISOVUE-300 IOPAMIDOL (ISOVUE-300) INJECTION 61% FINDINGS: Bones/Joint/Cartilage No acute fracture or dislocation of the left tibia or fibula. No acute fracture or dislocation of the left foot. Hallux valgus of the left foot. Prior osteotomies of the second and fifth metacarpal necks. Prior osteotomy of the calcaneus. Normal alignment. No joint effusion. 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 Subchondral sclerosis and cystic changes in the medial femoral condyle and medial tibial plateau. Tricompartmental marginal osteophytes. Moderate osteoarthritis of the tibiotalar joint. Intact ankle mortise. Mild osteoarthritis of the first MTP joint. Mild osteoarthritis of the first IP joint. Mild osteoarthritis of the second, third and fourth PIP and DIP joint. No aggressive lytic or sclerotic osseous lesion. Tiny plantar calcaneal spur. Ligaments Ligaments are suboptimally evaluated by CT. Muscles and Tendons The muscles are normal. No muscle atrophy. No intramuscular hematoma or fluid collection. Soft tissue 5.1 x 2.4 cm fat density mass in the anterior subcutaneous fat of the proximal left lower leg with an adjacent coarse calcification likely reflecting an area of fat necrosis versus less likely lipoma. Mild soft tissue edema in the right lower leg and foot. No focal fluid collection or hematoma. IMPRESSION: 1.  No acute osseous injury of the left tibia or fibula. 2.  No acute osseous injury of the left foot. 3. No soft tissue hematoma. 4. 5.1 x 2.4 cm fat density mass in the anterior subcutaneous fat of the proximal left lower leg with an adjacent coarse calcification likely reflecting an area of fat necrosis versus less likely lipoma. 5. 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 6. 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  Electronically Signed   By: Elige Ko   On: 06/07/2016 09:43   Ct Foot Left W Contrast  Result Date: 06/07/2016 CLINICAL DATA:  Dropped a 5 gallon water jug on left foot in Jan. Trouble with foot and leg since. EXAM: CT OF THE LOWER LEFT TIBIA AND FIBULA WITH CONTRAST CT OF THE LOWER LEFT FOOT WITH CONTRAST TECHNIQUE: Multidetector CT imaging of the lower left tibia and fibula was performed according to the standard protocol following intravenous contrast administration. Multidetector CT imaging of the lower left foot was performed according to the standard protocol following intravenous contrast administration. COMPARISON:  None. CONTRAST:  ISOVUE-300 IOPAMIDOL (ISOVUE-300) INJECTION 61%, ISOVUE-300 IOPAMIDOL (ISOVUE-300) INJECTION 61% FINDINGS: Bones/Joint/Cartilage No acute fracture or dislocation of the left tibia or fibula. No acute fracture or dislocation of the left foot. Hallux valgus of the left foot. Prior osteotomies of the second and fifth metacarpal necks. Prior osteotomy of the calcaneus. Normal alignment. No joint effusion. 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 Subchondral sclerosis and cystic changes in the medial femoral condyle and medial tibial plateau. Tricompartmental marginal osteophytes. Moderate osteoarthritis of the tibiotalar joint. Intact ankle mortise. Mild osteoarthritis of the first MTP joint. Mild osteoarthritis of the first IP joint. Mild osteoarthritis of the second, third and fourth PIP and DIP joint. No aggressive lytic or sclerotic osseous lesion. Tiny plantar calcaneal spur. Ligaments Ligaments are suboptimally evaluated by CT. Muscles and Tendons The muscles are normal. No muscle atrophy. No intramuscular hematoma or fluid collection. Soft tissue 5.1 x 2.4 cm fat density mass in the anterior subcutaneous fat of the proximal left lower leg with an adjacent  coarse calcification likely reflecting an area of fat necrosis versus less likely lipoma. Mild soft tissue edema in the right lower leg and foot. No focal fluid collection or hematoma. IMPRESSION: 1.  No acute osseous injury of the left tibia or fibula. 2.  No acute osseous injury of the left foot. 3. No soft tissue hematoma. 4. 5.1 x 2.4 cm fat density mass in the anterior subcutaneous fat of the proximal left lower leg with an adjacent coarse calcification likely reflecting an area of fat necrosis versus less likely lipoma. 5. 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 6. 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 Electronically Signed   By: Elige Ko   On: 06/07/2016 09:43   - pertinent xrays, CT, MRI studies were reviewed and independently interpreted  Positive ROS: All other systems have been reviewed and were otherwise negative with the exception of those mentioned in the HPI and as above.  Physical Exam: General: Alert, no acute distress Cardiovascular: No pedal edema Respiratory: No cyanosis, no use of accessory musculature GI: No organomegaly, abdomen is soft and non-tender Skin: No lesions in the area of chief complaint Neurologic: Sensation intact distally Psychiatric: Patient is competent for consent with normal mood and affect Lymphatic: No axillary or cervical lymphadenopathy  MUSCULOSKELETAL:    Dry eschars with surrounding cellulitis Moderately tender to palpation No drainage  Assessment: Left lower leg eschars  Plan: - eschars developed spontaneously - ?microvascular event? Or possibly related to the fat necrosis - would recommend plastic surgery for further advice - agree with IV abx - she's improving clinically  Thank you for the consult and the opportunity to see Rita Watkins  N. Glee Arvin, MD [facility] [phone] 4:37 PM

## 2016-06-07 NOTE — Telephone Encounter (Signed)
Dr. Butler Denmark was wanting to speak with you regarding the patient. She said she tried calling and sent you a text to your cell phone. CB # [phone]

## 2016-06-08 ENCOUNTER — Ambulatory Visit: Payer: Self-pay | Admitting: Plastic Surgery

## 2016-06-08 ENCOUNTER — Encounter (HOSPITAL_COMMUNITY): Payer: Self-pay | Admitting: Plastic Surgery

## 2016-06-08 DIAGNOSIS — S81802A Unspecified open wound, left lower leg, initial encounter: Secondary | ICD-10-CM

## 2016-06-08 DIAGNOSIS — I96 Gangrene, not elsewhere classified: Secondary | ICD-10-CM

## 2016-06-08 DIAGNOSIS — A319 Mycobacterial infection, unspecified: Secondary | ICD-10-CM

## 2016-06-08 LAB — URINALYSIS, ROUTINE W REFLEX MICROSCOPIC
BILIRUBIN URINE: NEGATIVE
GLUCOSE, UA: NEGATIVE mg/dL
KETONES UR: NEGATIVE mg/dL
NITRITE: NEGATIVE
PH: 5 (ref 5.0–8.0)
PROTEIN: NEGATIVE mg/dL
Specific Gravity, Urine: 1.02 (ref 1.005–1.030)

## 2016-06-08 LAB — CBC
HCT: 34.1 % — ABNORMAL LOW (ref 36.0–46.0)
Hemoglobin: 11.2 g/dL — ABNORMAL LOW (ref 12.0–15.0)
MCH: 29.5 pg (ref 26.0–34.0)
MCHC: 32.8 g/dL (ref 30.0–36.0)
MCV: 89.7 fL (ref 78.0–100.0)
Platelets: 326 10*3/uL (ref 150–400)
RBC: 3.8 MIL/uL — ABNORMAL LOW (ref 3.87–5.11)
RDW: 13 % (ref 11.5–15.5)
WBC: 9.9 10*3/uL (ref 4.0–10.5)

## 2016-06-08 LAB — BASIC METABOLIC PANEL
ANION GAP: 9 (ref 5–15)
BUN: 14 mg/dL (ref 6–20)
CHLORIDE: 105 mmol/L (ref 101–111)
CO2: 27 mmol/L (ref 22–32)
CREATININE: 0.67 mg/dL (ref 0.44–1.00)
Calcium: 9 mg/dL (ref 8.9–10.3)
Glucose, Bld: 92 mg/dL (ref 65–99)
Potassium: 3.3 mmol/L — ABNORMAL LOW (ref 3.5–5.1)
Sodium: 141 mmol/L (ref 135–145)

## 2016-06-08 LAB — SEDIMENTATION RATE: Sed Rate: 85 mm/hr — ABNORMAL HIGH (ref 0–22)

## 2016-06-08 LAB — C-REACTIVE PROTEIN: CRP: 7.2 mg/dL — ABNORMAL HIGH (ref <1.0)

## 2016-06-08 LAB — GLUCOSE, CAPILLARY: GLUCOSE-CAPILLARY: 85 mg/dL (ref 65–99)

## 2016-06-08 MED ORDER — POTASSIUM CHLORIDE CRYS ER 20 MEQ PO TBCR
40.0000 meq | EXTENDED_RELEASE_TABLET | ORAL | Status: AC
  Administered 2016-06-08 (×2): 40 meq via ORAL
  Filled 2016-06-08 (×2): qty 2

## 2016-06-08 MED ORDER — CHLORHEXIDINE GLUCONATE CLOTH 2 % EX PADS
6.0000 | MEDICATED_PAD | Freq: Once | CUTANEOUS | Status: AC
  Administered 2016-06-08: 6 via TOPICAL

## 2016-06-08 MED ORDER — CHLORHEXIDINE GLUCONATE CLOTH 2 % EX PADS
6.0000 | MEDICATED_PAD | Freq: Once | CUTANEOUS | Status: AC
  Administered 2016-06-09: 6 via TOPICAL

## 2016-06-08 NOTE — Consult Note (Signed)
Reason for Consult: left leg wound Referring Physician: Dr. Flossie Dibble  Rita Watkins is an 61 y.o. female.  HPI: The patient is a 61 yrs old wf here for treatment of a wound / infection of her left leg.  The patient states she has been dealing with this for the past 2 months.  It seems to be getting worse instead of better.  It started with her toe and now has necrosis, swelling, redness and tenderness on the leg just distal to the knee.  She has several medial conditions but not DM.  The area is ~ 6 x 10 cm.   Past Medical History:  Diagnosis Date  . Allergy   . Anxiety   . Asthma   . COPD (chronic obstructive pulmonary disease) ([place])   . Depression   . Hyperlipidemia   . Hypertension   . Lymphangitis 06/07/2016  . 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 FOOT SURGERY     Left  . HIP PINNING     3 each hip  . SLIPPED CAPITAL FEMORAL EPIPHYSIS PINNING      Family History  Problem Relation Age of Onset  . Hypertension Mother   . Hyperlipidemia Mother   . Hypertension Brother   . Asthma Brother     Social History:  reports that she has been smoking Cigarettes.  She has a 35.00 pack-year smoking history. She has never used smokeless tobacco. She reports that she does not drink alcohol or use drugs.  Allergies:  Allergies  Allergen Reactions  . Avelox [Moxifloxacin Hcl In Nacl] Anaphylaxis  . Terbinafine Hcl Swelling  . Zocor [Simvastatin - High Dose] Anaphylaxis  . Montelukast Sodium Other (See Comments)    Insomnia  . Pravastatin Other (See Comments)    Myalgias on 52m    Medications: I have reviewed the patient's current medications.  Results for orders placed or performed during the hospital encounter of 06/05/16 (from the past 48 hour(s))  CBC     Status: Abnormal   Collection Time: 06/07/16  5:46 AM  Result Value Ref Range   WBC 9.1 4.0 - 10.5 K/uL   RBC 3.50 (L) 3.87 - 5.11 MIL/uL   Hemoglobin 10.3  (L) 12.0 - 15.0 g/dL   HCT 31.4 (L) 36.0 - 46.0 %   MCV 89.7 78.0 - 100.0 fL   MCH 29.4 26.0 - 34.0 pg   MCHC 32.8 30.0 - 36.0 g/dL   RDW 13.1 11.5 - 15.5 %   Platelets 311 150 - 400 K/uL  Basic metabolic panel     Status: Abnormal   Collection Time: 06/07/16  5:46 AM  Result Value Ref Range   Sodium 140 135 - 145 mmol/L   Potassium 3.5 3.5 - 5.1 mmol/L   Chloride 105 101 - 111 mmol/L   CO2 27 22 - 32 mmol/L   Glucose, Bld 102 (H) 65 - 99 mg/dL   BUN 12 6 - 20 mg/dL   Creatinine, Ser 0.63 0.44 - 1.00 mg/dL   Calcium 8.7 (L) 8.9 - 10.3 mg/dL   GFR calc non Af Amer >60 >60 mL/min   GFR calc Af Amer >60 >60 mL/min    Comment: (NOTE) The eGFR has been calculated using the CKD EPI equation. This calculation has not been validated in all clinical situations. eGFR's persistently <60 mL/min signify possible Chronic Kidney Disease.    Anion gap 8 5 - 15  Glucose, capillary  Status: Abnormal   Collection Time: 06/07/16  7:39 AM  Result Value Ref Range   Glucose-Capillary 101 (H) 65 - 99 mg/dL  Basic metabolic panel     Status: Abnormal   Collection Time: 06/08/16  6:11 AM  Result Value Ref Range   Sodium 141 135 - 145 mmol/L   Potassium 3.3 (L) 3.5 - 5.1 mmol/L   Chloride 105 101 - 111 mmol/L   CO2 27 22 - 32 mmol/L   Glucose, Bld 92 65 - 99 mg/dL   BUN 14 6 - 20 mg/dL   Creatinine, Ser 0.67 0.44 - 1.00 mg/dL   Calcium 9.0 8.9 - 10.3 mg/dL   GFR calc non Af Amer >60 >60 mL/min   GFR calc Af Amer >60 >60 mL/min    Comment: (NOTE) The eGFR has been calculated using the CKD EPI equation. This calculation has not been validated in all clinical situations. eGFR's persistently <60 mL/min signify possible Chronic Kidney Disease.    Anion gap 9 5 - 15  CBC     Status: Abnormal   Collection Time: 06/08/16  6:11 AM  Result Value Ref Range   WBC 9.9 4.0 - 10.5 K/uL   RBC 3.80 (L) 3.87 - 5.11 MIL/uL   Hemoglobin 11.2 (L) 12.0 - 15.0 g/dL   HCT 34.1 (L) 36.0 - 46.0 %   MCV 89.7  78.0 - 100.0 fL   MCH 29.5 26.0 - 34.0 pg   MCHC 32.8 30.0 - 36.0 g/dL   RDW 13.0 11.5 - 15.5 %   Platelets 326 150 - 400 K/uL  Sedimentation rate     Status: Abnormal   Collection Time: 06/08/16  6:11 AM  Result Value Ref Range   Sed Rate 85 (H) 0 - 22 mm/hr  C-reactive protein     Status: Abnormal   Collection Time: 06/08/16  6:11 AM  Result Value Ref Range   CRP 7.2 (H) <1.0 mg/dL    Comment: Performed at [facility], [place] [address], [place], [place] [address]  Glucose, capillary     Status: None   Collection Time: 06/08/16  7:37 AM  Result Value Ref Range   Glucose-Capillary 85 65 - 99 mg/dL  Urinalysis, Routine w reflex microscopic     Status: Abnormal   Collection Time: 06/08/16 12:44 PM  Result Value Ref Range   Color, Urine YELLOW YELLOW   APPearance CLEAR CLEAR   Specific Gravity, Urine 1.020 1.005 - 1.030   pH 5.0 5.0 - 8.0   Glucose, UA NEGATIVE NEGATIVE mg/dL   Hgb urine dipstick MODERATE (A) NEGATIVE   Bilirubin Urine NEGATIVE NEGATIVE   Ketones, ur NEGATIVE NEGATIVE mg/dL   Protein, ur NEGATIVE NEGATIVE mg/dL   Nitrite NEGATIVE NEGATIVE   Leukocytes, UA SMALL (A) NEGATIVE   RBC / HPF 6-30 0 - 5 RBC/hpf   WBC, UA 0-5 0 - 5 WBC/hpf   Bacteria, UA RARE (A) NONE SEEN   Squamous Epithelial / LPF 0-5 (A) NONE SEEN   Mucous PRESENT     Ct Tibia Fibula Left W Contrast  Result Date: 06/07/2016 CLINICAL DATA:  Dropped a 5 gallon water jug on left foot in Jan. Trouble with foot and leg since. EXAM: CT OF THE LOWER LEFT TIBIA AND FIBULA WITH CONTRAST CT OF THE LOWER LEFT FOOT WITH CONTRAST TECHNIQUE: Multidetector CT imaging of the lower left tibia and fibula was performed according to the standard protocol following intravenous contrast administration. Multidetector CT imaging of the  lower left foot was performed according to the standard protocol following intravenous contrast administration. COMPARISON:  None. CONTRAST:  122m ISOVUE-300 IOPAMIDOL (ISOVUE-300)  INJECTION 61%, 1090mISOVUE-300 IOPAMIDOL (ISOVUE-300) INJECTION 61% FINDINGS: Bones/Joint/Cartilage No acute fracture or dislocation of the left tibia or fibula. No acute fracture or dislocation of the left foot. Hallux valgus of the left foot. Prior osteotomies of the second and fifth metacarpal necks. Prior osteotomy of the calcaneus. Normal alignment. No joint effusion. 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 Subchondral sclerosis and cystic changes in the medial femoral condyle and medial tibial plateau. Tricompartmental marginal osteophytes. Moderate osteoarthritis of the tibiotalar joint. Intact ankle mortise. Mild osteoarthritis of the first MTP joint. Mild osteoarthritis of the first IP joint. Mild osteoarthritis of the second, third and fourth PIP and DIP joint. No aggressive lytic or sclerotic osseous lesion. Tiny plantar calcaneal spur. Ligaments Ligaments are suboptimally evaluated by CT. Muscles and Tendons The muscles are normal. No muscle atrophy. No intramuscular hematoma or fluid collection. Soft tissue 5.1 x 2.4 cm fat density mass in the anterior subcutaneous fat of the proximal left lower leg with an adjacent coarse calcification likely reflecting an area of fat necrosis versus less likely lipoma. Mild soft tissue edema in the right lower leg and foot. No focal fluid collection or hematoma. IMPRESSION: 1.  No acute osseous injury of the left tibia or fibula. 2.  No acute osseous injury of the left foot. 3. No soft tissue hematoma. 4. 5.1 x 2.4 cm fat density mass in the anterior subcutaneous fat of the proximal left lower leg with an adjacent coarse calcification likely reflecting an area of fat necrosis versus less likely lipoma. 5. 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 6. 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 Electronically Signed   By: HeKathreen Devoid On:  06/07/2016 09:43   Ct Foot Left W Contrast  Result Date: 06/07/2016 CLINICAL DATA:  Dropped a 5 gallon water jug on left foot in Jan. Trouble with foot and leg since. EXAM: CT OF THE LOWER LEFT TIBIA AND FIBULA WITH CONTRAST CT OF THE LOWER LEFT FOOT WITH CONTRAST TECHNIQUE: Multidetector CT imaging of the lower left tibia and fibula was performed according to the standard protocol following intravenous contrast administration. Multidetector CT imaging of the lower left foot was performed according to the standard protocol following intravenous contrast administration. COMPARISON:  None. CONTRAST:  10072mSOVUE-300 IOPAMIDOL (ISOVUE-300) INJECTION 61%, 100m4mOVUE-300 IOPAMIDOL (ISOVUE-300) INJECTION 61% FINDINGS: Bones/Joint/Cartilage No acute fracture or dislocation of the left tibia or fibula. No acute fracture or dislocation of the left foot. Hallux valgus of the left foot. Prior osteotomies of the second and fifth metacarpal necks. Prior osteotomy of the calcaneus. Normal alignment. No joint effusion. 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 Subchondral sclerosis and cystic changes in the medial femoral condyle and medial tibial plateau. Tricompartmental marginal osteophytes. Moderate osteoarthritis of the tibiotalar joint. Intact ankle mortise. Mild osteoarthritis of the first MTP joint. Mild osteoarthritis of the first IP joint. Mild osteoarthritis of the second, third and fourth PIP and DIP joint. No aggressive lytic or sclerotic osseous lesion. Tiny plantar calcaneal spur. Ligaments Ligaments are suboptimally evaluated by CT. Muscles and Tendons The muscles are normal. No muscle atrophy. No intramuscular hematoma or fluid collection. Soft tissue 5.1 x 2.4 cm fat density mass in the anterior  subcutaneous fat of the proximal left lower leg with an adjacent coarse calcification likely reflecting an area of  fat necrosis versus less likely lipoma. Mild soft tissue edema in the right lower leg and foot. No focal fluid collection or hematoma. IMPRESSION: 1.  No acute osseous injury of the left tibia or fibula. 2.  No acute osseous injury of the left foot. 3. No soft tissue hematoma. 4. 5.1 x 2.4 cm fat density mass in the anterior subcutaneous fat of the proximal left lower leg with an adjacent coarse calcification likely reflecting an area of fat necrosis versus less likely lipoma. 5. 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 6. 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 Electronically Signed   By: Kathreen Devoid   On: 06/07/2016 09:43    Review of Systems  Constitutional: Positive for fever.  HENT: Negative.   Eyes: Negative.   Respiratory: Negative.   Cardiovascular: Positive for leg swelling.  Gastrointestinal: Negative.   Genitourinary: Negative.   Musculoskeletal: Positive for joint pain.  Skin: Negative.   Neurological: Negative.   Psychiatric/Behavioral: Negative.    Blood pressure (!) 113/51, pulse 69, temperature 98.7 F (37.1 C), temperature source Oral, resp. rate 18, height 5' 9"  (1.753 m), weight 116 kg (255 lb 11.7 oz), SpO2 99 %. Physical Exam  Constitutional: She is oriented to person, place, and time. She appears well-developed and well-nourished.  HENT:  Head: Normocephalic and atraumatic.  Eyes: EOM are normal. Pupils are equal, round, and reactive to light.  Cardiovascular: Normal rate.   Respiratory: Effort normal. No respiratory distress.  GI: Soft. She exhibits no distension. There is no tenderness.  Musculoskeletal:       Legs: Neurological: She is alert and oriented to person, place, and time.  Skin: Skin is warm. Rash noted. There is erythema.  Psychiatric: She has a normal mood and affect. Her behavior is normal. Judgment and thought content normal.    Assessment/Plan: Plan for OR tomorrow for debridement and bx.  Dr. Tommy Medal aware and we  will notify him tomorrow when finished.  Minimize sugar and increase protein.  Wallace Going 06/08/2016, 3:44 PM

## 2016-06-08 NOTE — Progress Notes (Signed)
PROGRESS NOTE    Rita Watkins   EYC:[id number]  DOB: 1955/09/08  DOA: 06/05/2016 PCP: Mackie Pai, PA-C   Brief Narrative:  61 year old female with a history of hypertension, hyperlipidemia, and depression presented with one-week history of increasing pain, erythema, and edema on her left side. The patient first noticed a "scab" on the medial aspect of her left thigh on 05/31/2016 while in the shower. Over the next 2-3 days, she noticed additional scabs that began the form with associated increasing edema, erythema and pain. She denied fevers, chills.   She has been seeing a podiatrist for left 4th toe infection and has been on different rounds of antibiotics since Feb. Most recently, the patient was started on clindamycin 300 mg every 8 hours on 05/23/2016 with which the patient endorses compliance. She feels that the erythema on her left foot and necrotic tissue on her left fourth toe has been improving.   Subjective: No new complaints. Part of the scab on the left leg has come off overnight and the "hardness" behind the thigh is improving. No change in pain.   Assessment & Plan:   Principal Problem:   Cellulitis/ necrosis - left thigh - occurred while taking Clindamycin as out patient  - 06/05/16 [place] left leg--neg for DVT - CRP 10.2, ESR 84 CK 34,  - appears to be improving -started off with necrotic skin- area is indurated as well-  CT today shows an area of possible fat necrosis-see report below-  have consulted Ortho and ID for advice regarding treatment plan - ortho recommending plastic surgery consult- I have spoken with Dr Marla Roe and ID. ID feels that this is related to the necrosis of the toe and possible mycobacterial infection related to a pedicure. Plan is for biopsy of the area. Dr Marla Roe trying to get her on the OR schedule. - ID has d/c'd Vanc & Flagyl - cont Rocephin for possible strep superinfection   Active Problems: Left 4th toe infection - no  osteomyelitis seen on CT - tip of toe appears necrotic - no signs of infection - under care of Dr Jacqualyn Posey  Moderate hemoglobinuria -with 6-30 RBCs - repeat      HTN (hypertension) -continue losartan/HCTZ  Panic Attacks -continue xanax prn  COPD -stable     DVT prophylaxis: Lovenox Code Status: full code Family Communication:  Disposition Plan: home when stable Consultants:   ortho Procedures:    Antimicrobials:  Anti-infectives    Start     Dose/Rate Route Frequency Ordered Stop   06/06/16 1530  metroNIDAZOLE (FLAGYL) IVPB 500 mg  Status:  Discontinued     500 mg 100 mL/hr over 60 Minutes Intravenous Every 8 hours 06/06/16 1520 06/07/16 1821   06/06/16 1300  cefTRIAXone (ROCEPHIN) 2 g in dextrose 5 % 50 mL IVPB     2 g 100 mL/hr over 30 Minutes Intravenous Every 24 hours 06/06/16 1220     06/06/16 0000  vancomycin (VANCOCIN) IVPB 1000 mg/200 mL premix  Status:  Discontinued     1,000 mg 200 mL/hr over 60 Minutes Intravenous Every 12 hours 06/05/16 1742 06/07/16 1437   06/05/16 2200  vancomycin (VANCOCIN) IVPB 1000 mg/200 mL premix  Status:  Discontinued     1,000 mg 200 mL/hr over 60 Minutes Intravenous Every 12 hours 06/05/16 1034 06/05/16 1543   06/05/16 1900  piperacillin-tazobactam (ZOSYN) IVPB 3.375 g  Status:  Discontinued     3.375 g 12.5 mL/hr over 240 Minutes Intravenous Every 8 hours 06/05/16  1034 06/05/16 1543   06/05/16 1800  piperacillin-tazobactam (ZOSYN) IVPB 3.375 g  Status:  Discontinued     3.375 g 12.5 mL/hr over 240 Minutes Intravenous Every 8 hours 06/05/16 1745 06/06/16 1220   06/05/16 1530  piperacillin-tazobactam (ZOSYN) IVPB 3.375 g  Status:  Discontinued     3.375 g 100 mL/hr over 30 Minutes Intravenous  Once 06/05/16 1521 06/05/16 1521   06/05/16 1530  vancomycin (VANCOCIN) IVPB 1000 mg/200 mL premix  Status:  Discontinued     1,000 mg 200 mL/hr over 60 Minutes Intravenous  Once 06/05/16 1521 06/05/16 1521   06/05/16 1230  vancomycin  (VANCOCIN) IVPB 1000 mg/200 mL premix     1,000 mg 200 mL/hr over 60 Minutes Intravenous  Once 06/05/16 1118 06/05/16 1348   06/05/16 1130  vancomycin (VANCOCIN) IVPB 1000 mg/200 mL premix     1,000 mg 200 mL/hr over 60 Minutes Intravenous  Once 06/05/16 1118 06/05/16 1239   06/05/16 1030  vancomycin (VANCOCIN) 2,000 mg in sodium chloride 0.9 % 500 mL IVPB  Status:  Discontinued     2,000 mg 250 mL/hr over 120 Minutes Intravenous  Once 06/05/16 1014 06/05/16 1118   06/05/16 1015  piperacillin-tazobactam (ZOSYN) IVPB 3.375 g  Status:  Discontinued     3.375 g 100 mL/hr over 30 Minutes Intravenous  Once 06/05/16 1014 06/05/16 1031   06/05/16 1000  piperacillin-tazobactam (ZOSYN) IVPB 3.375 g     3.375 g 100 mL/hr over 30 Minutes Intravenous  Once 06/05/16 0945 06/05/16 1112   06/05/16 1000  vancomycin (VANCOCIN) IVPB 1000 mg/200 mL premix  Status:  Discontinued     1,000 mg 200 mL/hr over 60 Minutes Intravenous  Once 06/05/16 0945 06/05/16 1031       Objective: Vitals:   06/07/16 0407 06/07/16 1408 06/07/16 2100 06/08/16 0607  BP: 137/65 128/60 (!) 127/55 126/64  Pulse: 70 66 65 71  Resp: 18 20 18 18   Temp: 98.6 F (37 C) 98.7 F (37.1 C) 98.1 F (36.7 C) 98.4 F (36.9 C)  TempSrc: Oral Oral Oral Oral  SpO2: 99% 100% 98% 98%  Weight:      Height:        Intake/Output Summary (Last 24 hours) at 06/08/16 1109 Last data filed at 06/08/16 0843  Gross per 24 hour  Intake             1820 ml  Output                0 ml  Net             1820 ml   Filed Weights   06/05/16 0915 06/05/16 1514 06/06/16 0427  Weight: 113.4 kg (250 lb) 113.9 kg (251 lb) 116 kg (255 lb 11.7 oz)    Examination: General exam: Appears comfortable  HEENT: PERRLA, oral mucosa moist, no sclera icterus or thrush Respiratory system: Clear to auscultation. Respiratory effort normal. Cardiovascular system: S1 & S2 heard, RRR.  No murmurs  Gastrointestinal system: Abdomen soft, non-tender, nondistended.  Normal bowel sound. No organomegaly Central nervous system: Alert and oriented. No focal neurological deficits. Extremities: no edema- see below in regards to toe Skin: necrosis of tip of left 4th toe, necrotic areas on inner left thigh with surrounding induration Psychiatry:  Mood & affect appropriate.     Data Reviewed: I have personally reviewed following labs and imaging studies  CBC:  Recent Labs Lab 06/05/16 0939 06/05/16 1611 06/06/16 1043 06/07/16 0546 06/08/16 8127  WBC 12.6* 13.2* 9.9 9.1 9.9  NEUTROABS 9.5*  --   --   --   --   HGB 11.9* 11.6* 10.6* 10.3* 11.2*  HCT 36.2 34.3* 32.4* 31.4* 34.1*  MCV 91.2 90.0 90.0 89.7 89.7  PLT 399 364 323 311 740   Basic Metabolic Panel:  Recent Labs Lab 06/05/16 0939 06/05/16 1611 06/06/16 0558 06/07/16 0546 06/08/16 0611  NA 139  --  139 140 141  K 4.4  --  3.5 3.5 3.3*  CL 102  --  103 105 105  CO2 28  --  28 27 27   GLUCOSE 103*  --  92 102* 92  BUN 16  --  11 12 14   CREATININE 0.76 0.69 0.63 0.63 0.67  CALCIUM 9.2  --  8.8* 8.7* 9.0   GFR: Estimated Creatinine Clearance: 99.1 mL/min (by C-G formula based on SCr of 0.67 mg/dL). Liver Function Tests:  Recent Labs Lab 06/05/16 0939  AST 13*  ALT 11*  ALKPHOS 82  BILITOT 0.6  PROT 8.0  ALBUMIN 3.4*   No results for input(s): LIPASE, AMYLASE in the last 168 hours. No results for input(s): AMMONIA in the last 168 hours. Coagulation Profile: No results for input(s): INR, PROTIME in the last 168 hours. Cardiac Enzymes:  Recent Labs Lab 06/06/16 1043  CKTOTAL 34*   BNP (last 3 results) No results for input(s): PROBNP in the last 8760 hours. HbA1C: No results for input(s): HGBA1C in the last 72 hours. CBG:  Recent Labs Lab 06/05/16 0925 06/06/16 0738 06/07/16 0739 06/08/16 0737  GLUCAP 105* 96 101* 85   Lipid Profile: No results for input(s): CHOL, HDL, LDLCALC, TRIG, CHOLHDL, LDLDIRECT in the last 72 hours. Thyroid Function Tests: No  results for input(s): TSH, T4TOTAL, FREET4, T3FREE, THYROIDAB in the last 72 hours. Anemia Panel: No results for input(s): VITAMINB12, FOLATE, FERRITIN, TIBC, IRON, RETICCTPCT in the last 72 hours. Urine analysis:    Component Value Date/Time   COLORURINE YELLOW 06/05/2016 1230   APPEARANCEUR CLEAR 06/05/2016 1230   LABSPEC 1.010 06/05/2016 1230   PHURINE 5.5 06/05/2016 1230   GLUCOSEU NEGATIVE 06/05/2016 1230   HGBUR MODERATE (A) 06/05/2016 1230   BILIRUBINUR NEGATIVE 06/05/2016 1230   KETONESUR NEGATIVE 06/05/2016 1230   PROTEINUR NEGATIVE 06/05/2016 1230   NITRITE NEGATIVE 06/05/2016 1230   LEUKOCYTESUR NEGATIVE 06/05/2016 1230   Sepsis Labs: @LABRCNTIP (procalcitonin:4,lacticidven:4) ) Recent Results (from the past 240 hour(s))  Blood Culture (routine x 2)     Status: None (Preliminary result)   Collection Time: 06/05/16  9:35 AM  Result Value Ref Range Status   Specimen Description BLOOD LEFT ARM  Final   Special Requests   Final    BOTTLES DRAWN AEROBIC AND ANAEROBIC Blood Culture adequate volume   Culture   Final    NO GROWTH 3 DAYS Performed at [facility], [place] [address] [place], [place] [address]    Report Status PENDING  Incomplete  Blood Culture (routine x 2)     Status: None (Preliminary result)   Collection Time: 06/05/16 10:20 AM  Result Value Ref Range Status   Specimen Description BLOOD LEFT ARM  Final   Special Requests   Final    BOTTLES DRAWN AEROBIC AND ANAEROBIC Blood Culture adequate volume   Culture   Final    NO GROWTH 3 DAYS Performed at [facility], [address], [place], [place] [address]    Report Status PENDING  Incomplete  Radiology Studies: Ct Tibia Fibula Left W Contrast  Result Date: 06/07/2016 CLINICAL DATA:  Dropped a 5 gallon water jug on left foot in Jan. Trouble with foot and leg since. EXAM: CT OF THE LOWER LEFT TIBIA AND FIBULA WITH CONTRAST CT OF THE LOWER LEFT FOOT WITH CONTRAST TECHNIQUE:  Multidetector CT imaging of the lower left tibia and fibula was performed according to the standard protocol following intravenous contrast administration. Multidetector CT imaging of the lower left foot was performed according to the standard protocol following intravenous contrast administration. COMPARISON:  None. CONTRAST:  114m ISOVUE-300 IOPAMIDOL (ISOVUE-300) INJECTION 61%, 1080mISOVUE-300 IOPAMIDOL (ISOVUE-300) INJECTION 61% FINDINGS: Bones/Joint/Cartilage No acute fracture or dislocation of the left tibia or fibula. No acute fracture or dislocation of the left foot. Hallux valgus of the left foot. Prior osteotomies of the second and fifth metacarpal necks. Prior osteotomy of the calcaneus. Normal alignment. No joint effusion. 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 Subchondral sclerosis and cystic changes in the medial femoral condyle and medial tibial plateau. Tricompartmental marginal osteophytes. Moderate osteoarthritis of the tibiotalar joint. Intact ankle mortise. Mild osteoarthritis of the first MTP joint. Mild osteoarthritis of the first IP joint. Mild osteoarthritis of the second, third and fourth PIP and DIP joint. No aggressive lytic or sclerotic osseous lesion. Tiny plantar calcaneal spur. Ligaments Ligaments are suboptimally evaluated by CT. Muscles and Tendons The muscles are normal. No muscle atrophy. No intramuscular hematoma or fluid collection. Soft tissue 5.1 x 2.4 cm fat density mass in the anterior subcutaneous fat of the proximal left lower leg with an adjacent coarse calcification likely reflecting an area of fat necrosis versus less likely lipoma. Mild soft tissue edema in the right lower leg and foot. No focal fluid collection or hematoma. IMPRESSION: 1.  No acute osseous injury of the left tibia or fibula. 2.  No acute osseous injury of the left foot. 3. No soft tissue hematoma.  4. 5.1 x 2.4 cm fat density mass in the anterior subcutaneous fat of the proximal left lower leg with an adjacent coarse calcification likely reflecting an area of fat necrosis versus less likely lipoma. 5. 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 6. 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 Electronically Signed   By: HeKathreen Devoid On: 06/07/2016 09:43   Ct Foot Left W Contrast  Result Date: 06/07/2016 CLINICAL DATA:  Dropped a 5 gallon water jug on left foot in Jan. Trouble with foot and leg since. EXAM: CT OF THE LOWER LEFT TIBIA AND FIBULA WITH CONTRAST CT OF THE LOWER LEFT FOOT WITH CONTRAST TECHNIQUE: Multidetector CT imaging of the lower left tibia and fibula was performed according to the standard protocol following intravenous contrast administration. Multidetector CT imaging of the lower left foot was performed according to the standard protocol following intravenous contrast administration. COMPARISON:  None. CONTRAST:  10061mSOVUE-300 IOPAMIDOL (ISOVUE-300) INJECTION 61%, 100m81mOVUE-300 IOPAMIDOL (ISOVUE-300) INJECTION 61% FINDINGS: Bones/Joint/Cartilage No acute fracture or dislocation of the left tibia or fibula. No acute fracture or dislocation of the left foot. Hallux valgus of the left foot. Prior osteotomies of the second and fifth metacarpal necks. Prior osteotomy of the calcaneus. Normal alignment. No joint effusion. 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 Subchondral sclerosis and cystic changes in the medial femoral condyle and medial tibial plateau. Tricompartmental marginal osteophytes. Moderate osteoarthritis of  the tibiotalar joint. Intact ankle mortise. Mild osteoarthritis of the first MTP joint. Mild osteoarthritis of the first IP joint. Mild osteoarthritis of the second, third and fourth PIP and DIP joint. No aggressive lytic or sclerotic osseous lesion.  Tiny plantar calcaneal spur. Ligaments Ligaments are suboptimally evaluated by CT. Muscles and Tendons The muscles are normal. No muscle atrophy. No intramuscular hematoma or fluid collection. Soft tissue 5.1 x 2.4 cm fat density mass in the anterior subcutaneous fat of the proximal left lower leg with an adjacent coarse calcification likely reflecting an area of fat necrosis versus less likely lipoma. Mild soft tissue edema in the right lower leg and foot. No focal fluid collection or hematoma. IMPRESSION: 1.  No acute osseous injury of the left tibia or fibula. 2.  No acute osseous injury of the left foot. 3. No soft tissue hematoma. 4. 5.1 x 2.4 cm fat density mass in the anterior subcutaneous fat of the proximal left lower leg with an adjacent coarse calcification likely reflecting an area of fat necrosis versus less likely lipoma. 5. 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 6. 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 Electronically Signed   By: Kathreen Devoid   On: 06/07/2016 09:43      Scheduled Meds: . enoxaparin (LOVENOX) injection  0.5 mg/kg Subcutaneous Q24H  . gabapentin  300 mg Oral QHS  . losartan  100 mg Oral Daily   And  . hydrochlorothiazide  12.5 mg Oral Daily  . LORazepam  2 mg Intravenous Once   Continuous Infusions: . cefTRIAXone (ROCEPHIN)  IV Stopped (06/07/16 1359)     LOS: 3 days    Time spent in minutes: 35    Debbe Odea, MD [facility] Pager: [email or web address] Password [email or web address] 06/08/2016, 11:09 AM

## 2016-06-08 NOTE — Anesthesia Preprocedure Evaluation (Addendum)
Anesthesia Evaluation  Patient identified by MRN, date of birth, ID band Patient awake    Reviewed: Allergy & Precautions, NPO status , Patient's Chart, lab work & pertinent test results  Airway Mallampati: II  TM Distance: >3 FB Neck ROM: Full    Dental  (+) Edentulous Lower, Partial Upper, Dental Advisory Given   Pulmonary COPD, Current Smoker,    Pulmonary exam normal        Cardiovascular hypertension, Normal cardiovascular exam     Neuro/Psych PSYCHIATRIC DISORDERS Anxiety Depression negative neurological ROS     GI/Hepatic negative GI ROS, Neg liver ROS,   Endo/Other  Morbid obesity  Renal/GU negative Renal ROS     Musculoskeletal   Abdominal   Peds  Hematology   Anesthesia Other Findings   Reproductive/Obstetrics                            Anesthesia Physical Anesthesia Plan  ASA: III  Anesthesia Plan: General   Post-op Pain Management:    Induction:   Airway Management Planned: LMA and Oral ETT  Additional Equipment:   Intra-op Plan:   Post-operative Plan: Extubation in OR  Informed Consent: I have reviewed the patients History and Physical, chart, labs and discussed the procedure including the risks, benefits and alternatives for the proposed anesthesia with the patient or authorized representative who has indicated his/her understanding and acceptance.   Dental advisory given  Plan Discussed with: CRNA and Anesthesiologist  Anesthesia Plan Comments:        Anesthesia Quick Evaluation

## 2016-06-08 NOTE — Progress Notes (Signed)
Subjective: No new complaints   Antibiotics:  Anti-infectives    Start     Dose/Rate Route Frequency Ordered Stop   06/06/16 1530  metroNIDAZOLE (FLAGYL) IVPB 500 mg  Status:  Discontinued     500 mg 100 mL/hr over 60 Minutes Intravenous Every 8 hours 06/06/16 1520 06/07/16 1821   06/06/16 1300  cefTRIAXone (ROCEPHIN) 2 g in dextrose 5 % 50 mL IVPB     2 g 100 mL/hr over 30 Minutes Intravenous Every 24 hours 06/06/16 1220     06/06/16 0000  vancomycin (VANCOCIN) IVPB 1000 mg/200 mL premix  Status:  Discontinued     1,000 mg 200 mL/hr over 60 Minutes Intravenous Every 12 hours 06/05/16 1742 06/07/16 1437   06/05/16 2200  vancomycin (VANCOCIN) IVPB 1000 mg/200 mL premix  Status:  Discontinued     1,000 mg 200 mL/hr over 60 Minutes Intravenous Every 12 hours 06/05/16 1034 06/05/16 1543   06/05/16 1900  piperacillin-tazobactam (ZOSYN) IVPB 3.375 g  Status:  Discontinued     3.375 g 12.5 mL/hr over 240 Minutes Intravenous Every 8 hours 06/05/16 1034 06/05/16 1543   06/05/16 1800  piperacillin-tazobactam (ZOSYN) IVPB 3.375 g  Status:  Discontinued     3.375 g 12.5 mL/hr over 240 Minutes Intravenous Every 8 hours 06/05/16 1745 06/06/16 1220   06/05/16 1530  piperacillin-tazobactam (ZOSYN) IVPB 3.375 g  Status:  Discontinued     3.375 g 100 mL/hr over 30 Minutes Intravenous  Once 06/05/16 1521 06/05/16 1521   06/05/16 1530  vancomycin (VANCOCIN) IVPB 1000 mg/200 mL premix  Status:  Discontinued     1,000 mg 200 mL/hr over 60 Minutes Intravenous  Once 06/05/16 1521 06/05/16 1521   06/05/16 1230  vancomycin (VANCOCIN) IVPB 1000 mg/200 mL premix     1,000 mg 200 mL/hr over 60 Minutes Intravenous  Once 06/05/16 1118 06/05/16 1348   06/05/16 1130  vancomycin (VANCOCIN) IVPB 1000 mg/200 mL premix     1,000 mg 200 mL/hr over 60 Minutes Intravenous  Once 06/05/16 1118 06/05/16 1239   06/05/16 1030  vancomycin (VANCOCIN) 2,000 mg in sodium chloride 0.9 % 500 mL IVPB  Status:   Discontinued     2,000 mg 250 mL/hr over 120 Minutes Intravenous  Once 06/05/16 1014 06/05/16 1118   06/05/16 1015  piperacillin-tazobactam (ZOSYN) IVPB 3.375 g  Status:  Discontinued     3.375 g 100 mL/hr over 30 Minutes Intravenous  Once 06/05/16 1014 06/05/16 1031   06/05/16 1000  piperacillin-tazobactam (ZOSYN) IVPB 3.375 g     3.375 g 100 mL/hr over 30 Minutes Intravenous  Once 06/05/16 0945 06/05/16 1112   06/05/16 1000  vancomycin (VANCOCIN) IVPB 1000 mg/200 mL premix  Status:  Discontinued     1,000 mg 200 mL/hr over 60 Minutes Intravenous  Once 06/05/16 0945 06/05/16 1031      Medications: Scheduled Meds: . Chlorhexidine Gluconate Cloth  6 each Topical Once   And  . Chlorhexidine Gluconate Cloth  6 each Topical Once  . enoxaparin (LOVENOX) injection  0.5 mg/kg Subcutaneous Q24H  . gabapentin  300 mg Oral QHS  . losartan  100 mg Oral Daily   And  . hydrochlorothiazide  12.5 mg Oral Daily  . LORazepam  2 mg Intravenous Once   Continuous Infusions: . cefTRIAXone (ROCEPHIN)  IV Stopped (06/08/16 1355)   PRN Meds:.ALPRAZolam, hydrALAZINE, HYDROcodone-acetaminophen, HYDROmorphone (DILAUDID) injection, ondansetron **OR** ondansetron (ZOFRAN) IV    Objective: Weight change:  Intake/Output Summary (Last 24 hours) at 06/08/16 1731 Last data filed at 06/08/16 1230  Gross per 24 hour  Intake              720 ml  Output                0 ml  Net              720 ml   Blood pressure (!) 113/51, pulse 69, temperature 98.7 F (37.1 C), temperature source Oral, resp. rate 18, height 5\' 9"  (1.753 m), weight 255 lb 11.7 oz (116 kg), SpO2 99 %. Temp:  [98.1 F (36.7 C)-98.7 F (37.1 C)] 98.7 F (37.1 C) (05/03 1431) Pulse Rate:  [65-71] 69 (05/03 1431) Resp:  [18] 18 (05/03 1431) BP: (113-127)/(51-64) 113/51 (05/03 1431) SpO2:  [98 %-99 %] 99 % (05/03 1431)  Physical Exam: General: Alert and awake, oriented x3, not in any acute distress. HEENT: anicteric sclera,   EOMI, Pulmonary: cno wheezing, or resp distress Gastrointestinal: soft nontender, nondistended, normal bowel sounds, Musculoskeletal/skin   Foot and toe 06/05/16:      Toe 06/07/16:     Leg 06/05/16:      Leg 06/07/16:       06/08/16:        Neuro: nonfocal  CBC: CBC Latest Ref Rng & Units 06/08/2016 06/07/2016 06/06/2016  WBC 4.0 - 10.5 K/uL 9.9 9.1 9.9  Hemoglobin 12.0 - 15.0 g/dL 11.2(L) 10.3(L) 10.6(L)  Hematocrit 36.0 - 46.0 % 34.1(L) 31.4(L) 32.4(L)  Platelets 150 - 400 K/uL 326 311 323      BMET  Recent Labs  06/07/16 0546 06/08/16 0611  NA 140 141  K 3.5 3.3*  CL 105 105  CO2 27 27  GLUCOSE 102* 92  BUN 12 14  CREATININE 0.63 0.67  CALCIUM 8.7* 9.0     Liver Panel  No results for input(s): PROT, ALBUMIN, AST, ALT, ALKPHOS, BILITOT, BILIDIR, IBILI in the last 72 hours.     Sedimentation Rate  Recent Labs  06/08/16 0611  ESRSEDRATE 85*   C-Reactive Protein  Recent Labs  06/06/16 1043 06/08/16 0611  CRP 10.2* 7.2*    Micro Results: Recent Results (from the past 720 hour(s))  Blood Culture (routine x 2)     Status: None (Preliminary result)   Collection Time: 06/05/16  9:35 AM  Result Value Ref Range Status   Specimen Description BLOOD LEFT ARM  Final   Special Requests   Final    BOTTLES DRAWN AEROBIC AND ANAEROBIC Blood Culture adequate volume   Culture   Final    NO GROWTH 3 DAYS Performed at [facility], [address] [place], [place] [address]    Report Status PENDING  Incomplete  Blood Culture (routine x 2)     Status: None (Preliminary result)   Collection Time: 06/05/16 10:20 AM  Result Value Ref Range Status   Specimen Description BLOOD LEFT ARM  Final   Special Requests   Final    BOTTLES DRAWN AEROBIC AND ANAEROBIC Blood Culture adequate volume   Culture   Final    NO GROWTH 3 DAYS Performed at [facility], [address] [place], [place] [address]    Report Status  PENDING  Incomplete    Studies/Results: Ct Tibia Fibula Left W Contrast  Result Date: 06/07/2016 CLINICAL DATA:  Dropped a 5 gallon water jug on left foot in Jan. Trouble with foot and leg since. EXAM: CT  OF THE LOWER LEFT TIBIA AND FIBULA WITH CONTRAST CT OF THE LOWER LEFT FOOT WITH CONTRAST TECHNIQUE: Multidetector CT imaging of the lower left tibia and fibula was performed according to the standard protocol following intravenous contrast administration. Multidetector CT imaging of the lower left foot was performed according to the standard protocol following intravenous contrast administration. COMPARISON:  None. CONTRAST:  100mL ISOVUE-300 IOPAMIDOL (ISOVUE-300) INJECTION 61%, 100mL ISOVUE-300 IOPAMIDOL (ISOVUE-300) INJECTION 61% FINDINGS: Bones/Joint/Cartilage No acute fracture or dislocation of the left tibia or fibula. No acute fracture or dislocation of the left foot. Hallux valgus of the left foot. Prior osteotomies of the second and fifth metacarpal necks. Prior osteotomy of the calcaneus. Normal alignment. No joint effusion. 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 Subchondral sclerosis and cystic changes in the medial femoral condyle and medial tibial plateau. Tricompartmental marginal osteophytes. Moderate osteoarthritis of the tibiotalar joint. Intact ankle mortise. Mild osteoarthritis of the first MTP joint. Mild osteoarthritis of the first IP joint. Mild osteoarthritis of the second, third and fourth PIP and DIP joint. No aggressive lytic or sclerotic osseous lesion. Tiny plantar calcaneal spur. Ligaments Ligaments are suboptimally evaluated by CT. Muscles and Tendons The muscles are normal. No muscle atrophy. No intramuscular hematoma or fluid collection. Soft tissue 5.1 x 2.4 cm fat density mass in the anterior subcutaneous fat of the proximal left lower leg with an adjacent coarse  calcification likely reflecting an area of fat necrosis versus less likely lipoma. Mild soft tissue edema in the right lower leg and foot. No focal fluid collection or hematoma. IMPRESSION: 1.  No acute osseous injury of the left tibia or fibula. 2.  No acute osseous injury of the left foot. 3. No soft tissue hematoma. 4. 5.1 x 2.4 cm fat density mass in the anterior subcutaneous fat of the proximal left lower leg with an adjacent coarse calcification likely reflecting an area of fat necrosis versus less likely lipoma. 5. 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 6. 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 Electronically Signed   By: Elige KoHetal  Patel   On: 06/07/2016 09:43   Ct Foot Left W Contrast  Result Date: 06/07/2016 CLINICAL DATA:  Dropped a 5 gallon water jug on left foot in Jan. Trouble with foot and leg since. EXAM: CT OF THE LOWER LEFT TIBIA AND FIBULA WITH CONTRAST CT OF THE LOWER LEFT FOOT WITH CONTRAST TECHNIQUE: Multidetector CT imaging of the lower left tibia and fibula was performed according to the standard protocol following intravenous contrast administration. Multidetector CT imaging of the lower left foot was performed according to the standard protocol following intravenous contrast administration. COMPARISON:  None. CONTRAST:  100mL ISOVUE-300 IOPAMIDOL (ISOVUE-300) INJECTION 61%, 100mL ISOVUE-300 IOPAMIDOL (ISOVUE-300) INJECTION 61% FINDINGS: Bones/Joint/Cartilage No acute fracture or dislocation of the left tibia or fibula. No acute fracture or dislocation of the left foot. Hallux valgus of the left foot. Prior osteotomies of the second and fifth metacarpal necks. Prior osteotomy of the calcaneus. Normal alignment. No joint effusion. 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 Subchondral sclerosis and cystic changes in the medial femoral condyle and medial tibial  plateau. Tricompartmental marginal osteophytes. Moderate osteoarthritis of the tibiotalar joint. Intact ankle mortise. Mild osteoarthritis of the first MTP joint. Mild osteoarthritis of the first IP joint. Mild osteoarthritis of the second, third and fourth PIP and DIP joint. No aggressive  lytic or sclerotic osseous lesion. Tiny plantar calcaneal spur. Ligaments Ligaments are suboptimally evaluated by CT. Muscles and Tendons The muscles are normal. No muscle atrophy. No intramuscular hematoma or fluid collection. Soft tissue 5.1 x 2.4 cm fat density mass in the anterior subcutaneous fat of the proximal left lower leg with an adjacent coarse calcification likely reflecting an area of fat necrosis versus less likely lipoma. Mild soft tissue edema in the right lower leg and foot. No focal fluid collection or hematoma. IMPRESSION: 1.  No acute osseous injury of the left tibia or fibula. 2.  No acute osseous injury of the left foot. 3. No soft tissue hematoma. 4. 5.1 x 2.4 cm fat density mass in the anterior subcutaneous fat of the proximal left lower leg with an adjacent coarse calcification likely reflecting an area of fat necrosis versus less likely lipoma. 5. 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 6. 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 Electronically Signed   By: Elige Ko   On: 06/07/2016 09:43      Assessment/Plan:  INTERVAL HISTORY: Dr. Ulice Bold has seen pt and to take her to the OR tomorrow   Principal Problem:   Mycobacterium infection, atypical Active Problems:   Hyperlipidemia   HTN (hypertension)   Cellulitis   Lymphangitis   Abscess   Morbid obesity (HCC)    MARIAFERNANDA HENDRICKSEN is a 61 y.o. female with  61 y.o. female with hx of frequent pedicures here in [place] last one shortly before Xmas holidays, sp trauma to foot with large fresh water dispending jug, ensuing swollen tender toe and feet which have progressed in ensuring 5 months despite treatment with  keflex and clindamycin and local wound care by podiatry. Patient has lost nail and toe is still quite tender and necrotic appearing. She now has likely lymphangitic spread of this infection to her lower calf and thigh where there are areas of intensely painful necrosis.   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   LOS: 3 days   Paulette Blanch Dam 06/08/2016, 5:31 PM

## 2016-06-09 ENCOUNTER — Encounter (HOSPITAL_COMMUNITY): Payer: Self-pay | Admitting: Certified Registered Nurse Anesthetist

## 2016-06-09 ENCOUNTER — Inpatient Hospital Stay (HOSPITAL_COMMUNITY): Payer: Self-pay | Admitting: Anesthesiology

## 2016-06-09 ENCOUNTER — Encounter (HOSPITAL_COMMUNITY)
Admission: EM | Disposition: A | Discharge status: Home Health | Payer: Self-pay | Source: Home / Self Care | Attending: Internal Medicine

## 2016-06-09 DIAGNOSIS — A318 Other mycobacterial infections: Secondary | ICD-10-CM

## 2016-06-09 HISTORY — PX: LYMPH NODE BIOPSY: SHX201

## 2016-06-09 LAB — MPO/PR-3 (ANCA) ANTIBODIES: ANCA Proteinase 3: <3.5 U/mL (ref 0.0–3.5)

## 2016-06-09 LAB — BASIC METABOLIC PANEL
ANION GAP: 9 (ref 5–15)
BUN: 18 mg/dL (ref 6–20)
CALCIUM: 8.8 mg/dL — AB (ref 8.9–10.3)
CO2: 26 mmol/L (ref 22–32)
Chloride: 105 mmol/L (ref 101–111)
Creatinine, Ser: 0.6 mg/dL (ref 0.44–1.00)
Glucose, Bld: 112 mg/dL — ABNORMAL HIGH (ref 65–99)
Potassium: 3.4 mmol/L — ABNORMAL LOW (ref 3.5–5.1)
SODIUM: 140 mmol/L (ref 135–145)

## 2016-06-09 LAB — HCV COMMENT:

## 2016-06-09 LAB — HEPATITIS B SURFACE ANTIGEN: Hepatitis B Surface Ag: NEGATIVE

## 2016-06-09 LAB — HEPATITIS C ANTIBODY (REFLEX)

## 2016-06-09 LAB — SURGICAL PCR SCREEN
MRSA, PCR: NEGATIVE
Staphylococcus aureus: NEGATIVE

## 2016-06-09 LAB — ANTINUCLEAR ANTIBODIES, IFA: ANA Ab, IFA: NEGATIVE

## 2016-06-09 SURGERY — LYMPH NODE BIOPSY
Anesthesia: General | Site: Leg Lower | Laterality: Left

## 2016-06-09 SURGERY — Surgical Case
Anesthesia: *Unknown

## 2016-06-09 MED ORDER — HYDROCODONE-ACETAMINOPHEN 5-325 MG PO TABS: 1.0000 | ORAL_TABLET | Freq: Four times a day (QID) | ORAL | 0 refills | Status: DC | PRN

## 2016-06-09 MED ORDER — SODIUM CHLORIDE 0.9 % IR SOLN
Status: DC | PRN
  Administered 2016-06-09: 1000 mL

## 2016-06-09 MED ORDER — EPHEDRINE SULFATE-NACL 50-0.9 MG/10ML-% IV SOSY
PREFILLED_SYRINGE | INTRAVENOUS | Status: DC | PRN
  Administered 2016-06-09: 10 mg via INTRAVENOUS
  Administered 2016-06-09: 20 mg via INTRAVENOUS
  Administered 2016-06-09: 5 mg via INTRAVENOUS

## 2016-06-09 MED ORDER — EPHEDRINE 5 MG/ML INJ
INTRAVENOUS | Status: AC
  Filled 2016-06-09: qty 10

## 2016-06-09 MED ORDER — DEXAMETHASONE SODIUM PHOSPHATE 10 MG/ML IJ SOLN
INTRAMUSCULAR | Status: DC | PRN
  Administered 2016-06-09: 10 mg via INTRAVENOUS

## 2016-06-09 MED ORDER — LIDOCAINE 2% (20 MG/ML) 5 ML SYRINGE
INTRAMUSCULAR | Status: DC | PRN
Start: 2016-06-09 — End: 2016-06-09
  Administered 2016-06-09: 100 mg via INTRAVENOUS

## 2016-06-09 MED ORDER — DIPHENHYDRAMINE HCL 50 MG/ML IJ SOLN
INTRAMUSCULAR | Status: AC
  Filled 2016-06-09: qty 1

## 2016-06-09 MED ORDER — MIDAZOLAM HCL 5 MG/5ML IJ SOLN
INTRAMUSCULAR | Status: DC | PRN
  Administered 2016-06-09: 2 mg via INTRAVENOUS

## 2016-06-09 MED ORDER — PROPOFOL 10 MG/ML IV BOLUS
INTRAVENOUS | Status: DC | PRN
  Administered 2016-06-09: 200 mg via INTRAVENOUS

## 2016-06-09 MED ORDER — HYDROMORPHONE HCL 1 MG/ML IJ SOLN: 0.2500 mg | INTRAMUSCULAR | Status: DC | PRN

## 2016-06-09 MED ORDER — FENTANYL CITRATE (PF) 100 MCG/2ML IJ SOLN
INTRAMUSCULAR | Status: AC
  Filled 2016-06-09: qty 2

## 2016-06-09 MED ORDER — LACTATED RINGERS IV SOLN: INTRAVENOUS | Status: DC

## 2016-06-09 MED ORDER — SODIUM CHLORIDE 0.9 % IV SOLN
INTRAVENOUS | Status: DC
  Filled 2016-06-09: qty 250

## 2016-06-09 MED ORDER — LIDOCAINE HCL 2 % IJ SOLN
INTRAMUSCULAR | Status: AC
  Filled 2016-06-09: qty 20

## 2016-06-09 MED ORDER — MORPHINE SULFATE (PF) 4 MG/ML IV SOLN
INTRAVENOUS | Status: AC
  Filled 2016-06-09: qty 1

## 2016-06-09 MED ORDER — ONDANSETRON HCL 4 MG/2ML IJ SOLN
INTRAMUSCULAR | Status: DC | PRN
  Administered 2016-06-09: 4 mg via INTRAVENOUS

## 2016-06-09 MED ORDER — LACTATED RINGERS IV SOLN
INTRAVENOUS | Status: DC | PRN
  Administered 2016-06-09: 07:00:00 via INTRAVENOUS

## 2016-06-09 MED ORDER — PROMETHAZINE HCL 25 MG/ML IJ SOLN: 6.2500 mg | INTRAMUSCULAR | Status: DC | PRN

## 2016-06-09 MED ORDER — MORPHINE SULFATE (PF) 4 MG/ML IV SOLN
1.0000 mg | Freq: Once | INTRAVENOUS | Status: AC
  Administered 2016-06-09: 1 mg via INTRAVENOUS

## 2016-06-09 MED ORDER — DIPHENHYDRAMINE HCL 50 MG/ML IJ SOLN
INTRAMUSCULAR | Status: DC | PRN
  Administered 2016-06-09: 12.5 mg via INTRAVENOUS

## 2016-06-09 MED ORDER — LIDOCAINE HCL (PF) 2 % IJ SOLN
0.0000 mL | Freq: Once | INTRAMUSCULAR | Status: DC | PRN
  Filled 2016-06-09: qty 20

## 2016-06-09 MED ORDER — MIDAZOLAM HCL 2 MG/2ML IJ SOLN
INTRAMUSCULAR | Status: AC
  Filled 2016-06-09: qty 2

## 2016-06-09 MED ORDER — FENTANYL CITRATE (PF) 100 MCG/2ML IJ SOLN
INTRAMUSCULAR | Status: DC | PRN
  Administered 2016-06-09 (×2): 50 ug via INTRAVENOUS

## 2016-06-09 MED ORDER — DEXAMETHASONE SODIUM PHOSPHATE 10 MG/ML IJ SOLN
INTRAMUSCULAR | Status: AC
  Filled 2016-06-09: qty 1

## 2016-06-09 MED ORDER — LIDOCAINE 2% (20 MG/ML) 5 ML SYRINGE
INTRAMUSCULAR | Status: AC
  Filled 2016-06-09: qty 5

## 2016-06-09 MED ORDER — CLARITHROMYCIN 500 MG PO TABS
500.0000 mg | ORAL_TABLET | Freq: Two times a day (BID) | ORAL | Status: DC
  Filled 2016-06-09: qty 1

## 2016-06-09 MED ORDER — ONDANSETRON HCL 4 MG/2ML IJ SOLN
INTRAMUSCULAR | Status: AC
  Filled 2016-06-09: qty 2

## 2016-06-09 MED FILL — HYDROCODON-APAP 5-325: 5-325 | 5 days supply | Qty: 20 | Fill #0

## 2016-06-09 SURGICAL SUPPLY — 33 items
BANDAGE ACE 4X5 VEL STRL LF (GAUZE/BANDAGES/DRESSINGS) IMPLANT
BNDG GAUZE ELAST 4 BULKY (GAUZE/BANDAGES/DRESSINGS) IMPLANT
CONT SPECI 4OZ STER CLIK (MISCELLANEOUS) ×3 IMPLANT
DECANTER SPIKE VIAL GLASS SM (MISCELLANEOUS) IMPLANT
DRAPE INCISE IOBAN 66X45 STRL (DRAPES) ×3 IMPLANT
DRAPE LAPAROTOMY T 102X78X121 (DRAPES) ×3 IMPLANT
DRAPE UTILITY XL STRL (DRAPES) ×3 IMPLANT
DRESSING DUODERM 4X4 STERILE (GAUZE/BANDAGES/DRESSINGS) IMPLANT
DRSG ADAPTIC 3X8 NADH LF (GAUZE/BANDAGES/DRESSINGS) IMPLANT
DRSG PAD ABDOMINAL 8X10 ST (GAUZE/BANDAGES/DRESSINGS) ×6 IMPLANT
ELECT REM PT RETURN 15FT ADLT (MISCELLANEOUS) ×3 IMPLANT
GAUZE SPONGE 4X4 12PLY STRL (GAUZE/BANDAGES/DRESSINGS) ×3 IMPLANT
GLOVE BIO SURGEON STRL SZ 6.5 (GLOVE) ×2 IMPLANT
GLOVE BIO SURGEONS STRL SZ 6.5 (GLOVE) ×1
GOWN STRL REUS W/TWL LRG LVL3 (GOWN DISPOSABLE) ×6 IMPLANT
HANDPIECE INTERPULSE COAX TIP (DISPOSABLE)
KIT BASIN OR (CUSTOM PROCEDURE TRAY) ×3 IMPLANT
NEEDLE HYPO 22GX1.5 SAFETY (NEEDLE) IMPLANT
PACK GENERAL/GYN (CUSTOM PROCEDURE TRAY) ×3 IMPLANT
PAD ABD 8X10 STRL (GAUZE/BANDAGES/DRESSINGS) ×3 IMPLANT
PAD CAST 4YDX4 CTTN HI CHSV (CAST SUPPLIES) ×1 IMPLANT
PADDING CAST COTTON 4X4 STRL (CAST SUPPLIES) ×2
SET HNDPC FAN SPRY TIP SCT (DISPOSABLE) IMPLANT
SOL PREP PROV IODINE SCRUB 4OZ (MISCELLANEOUS) IMPLANT
STAPLER VISISTAT 35W (STAPLE) ×3 IMPLANT
SUT MNCRL AB 4-0 PS2 18 (SUTURE) IMPLANT
SUT SILK 4 0 PS 2 (SUTURE) IMPLANT
SUT VIC AB 5-0 PS2 18 (SUTURE) IMPLANT
SWAB COLLECTION DEVICE MRSA (MISCELLANEOUS) ×6 IMPLANT
SWAB CULTURE ESWAB REG 1ML (MISCELLANEOUS) ×6 IMPLANT
SYR CONTROL 10ML LL (SYRINGE) IMPLANT
TAPE CLOTH SURG 4X10 WHT LF (GAUZE/BANDAGES/DRESSINGS) ×3 IMPLANT
TOWEL OR 17X26 10 PK STRL BLUE (TOWEL DISPOSABLE) ×3 IMPLANT

## 2016-06-09 NOTE — H&P (View-Only) (Signed)
Reason for Consult: left leg wound Referring Physician: Dr. Flossie Dibble  Rita Watkins is an 61 y.o. female.  HPI: The patient is a 61 yrs old wf here for treatment of a wound / infection of her left leg.  The patient states she has been dealing with this for the past 2 months.  It seems to be getting worse instead of better.  It started with her toe and now has necrosis, swelling, redness and tenderness on the leg just distal to the knee.  She has several medial conditions but not DM.  The area is ~ 6 x 10 cm.   Past Medical History:  Diagnosis Date  . Allergy   . Anxiety   . Asthma   . COPD (chronic obstructive pulmonary disease) ([place])   . Depression   . Hyperlipidemia   . Hypertension   . Lymphangitis 06/07/2016  . 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 FOOT SURGERY     Left  . HIP PINNING     3 each hip  . SLIPPED CAPITAL FEMORAL EPIPHYSIS PINNING      Family History  Problem Relation Age of Onset  . Hypertension Mother   . Hyperlipidemia Mother   . Hypertension Brother   . Asthma Brother     Social History:  reports that she has been smoking Cigarettes.  She has a 35.00 pack-year smoking history. She has never used smokeless tobacco. She reports that she does not drink alcohol or use drugs.  Allergies:  Allergies  Allergen Reactions  . Avelox [Moxifloxacin Hcl In Nacl] Anaphylaxis  . Terbinafine Hcl Swelling  . Zocor [Simvastatin - High Dose] Anaphylaxis  . Montelukast Sodium Other (See Comments)    Insomnia  . Pravastatin Other (See Comments)    Myalgias on 59m    Medications: I have reviewed the patient's current medications.  Results for orders placed or performed during the hospital encounter of 06/05/16 (from the past 48 hour(s))  CBC     Status: Abnormal   Collection Time: 06/07/16  5:46 AM  Result Value Ref Range   WBC 9.1 4.0 - 10.5 K/uL   RBC 3.50 (L) 3.87 - 5.11 MIL/uL   Hemoglobin 10.3  (L) 12.0 - 15.0 g/dL   HCT 31.4 (L) 36.0 - 46.0 %   MCV 89.7 78.0 - 100.0 fL   MCH 29.4 26.0 - 34.0 pg   MCHC 32.8 30.0 - 36.0 g/dL   RDW 13.1 11.5 - 15.5 %   Platelets 311 150 - 400 K/uL  Basic metabolic panel     Status: Abnormal   Collection Time: 06/07/16  5:46 AM  Result Value Ref Range   Sodium 140 135 - 145 mmol/L   Potassium 3.5 3.5 - 5.1 mmol/L   Chloride 105 101 - 111 mmol/L   CO2 27 22 - 32 mmol/L   Glucose, Bld 102 (H) 65 - 99 mg/dL   BUN 12 6 - 20 mg/dL   Creatinine, Ser 0.63 0.44 - 1.00 mg/dL   Calcium 8.7 (L) 8.9 - 10.3 mg/dL   GFR calc non Af Amer >60 >60 mL/min   GFR calc Af Amer >60 >60 mL/min    Comment: (NOTE) The eGFR has been calculated using the CKD EPI equation. This calculation has not been validated in all clinical situations. eGFR's persistently <60 mL/min signify possible Chronic Kidney Disease.    Anion gap 8 5 - 15  Glucose, capillary  Status: Abnormal   Collection Time: 06/07/16  7:39 AM  Result Value Ref Range   Glucose-Capillary 101 (H) 65 - 99 mg/dL  Basic metabolic panel     Status: Abnormal   Collection Time: 06/08/16  6:11 AM  Result Value Ref Range   Sodium 141 135 - 145 mmol/L   Potassium 3.3 (L) 3.5 - 5.1 mmol/L   Chloride 105 101 - 111 mmol/L   CO2 27 22 - 32 mmol/L   Glucose, Bld 92 65 - 99 mg/dL   BUN 14 6 - 20 mg/dL   Creatinine, Ser 0.67 0.44 - 1.00 mg/dL   Calcium 9.0 8.9 - 10.3 mg/dL   GFR calc non Af Amer >60 >60 mL/min   GFR calc Af Amer >60 >60 mL/min    Comment: (NOTE) The eGFR has been calculated using the CKD EPI equation. This calculation has not been validated in all clinical situations. eGFR's persistently <60 mL/min signify possible Chronic Kidney Disease.    Anion gap 9 5 - 15  CBC     Status: Abnormal   Collection Time: 06/08/16  6:11 AM  Result Value Ref Range   WBC 9.9 4.0 - 10.5 K/uL   RBC 3.80 (L) 3.87 - 5.11 MIL/uL   Hemoglobin 11.2 (L) 12.0 - 15.0 g/dL   HCT 34.1 (L) 36.0 - 46.0 %   MCV 89.7  78.0 - 100.0 fL   MCH 29.5 26.0 - 34.0 pg   MCHC 32.8 30.0 - 36.0 g/dL   RDW 13.0 11.5 - 15.5 %   Platelets 326 150 - 400 K/uL  Sedimentation rate     Status: Abnormal   Collection Time: 06/08/16  6:11 AM  Result Value Ref Range   Sed Rate 85 (H) 0 - 22 mm/hr  C-reactive protein     Status: Abnormal   Collection Time: 06/08/16  6:11 AM  Result Value Ref Range   CRP 7.2 (H) <1.0 mg/dL    Comment: Performed at [facility], [place] [address], [place], [place] [address]  Glucose, capillary     Status: None   Collection Time: 06/08/16  7:37 AM  Result Value Ref Range   Glucose-Capillary 85 65 - 99 mg/dL  Urinalysis, Routine w reflex microscopic     Status: Abnormal   Collection Time: 06/08/16 12:44 PM  Result Value Ref Range   Color, Urine YELLOW YELLOW   APPearance CLEAR CLEAR   Specific Gravity, Urine 1.020 1.005 - 1.030   pH 5.0 5.0 - 8.0   Glucose, UA NEGATIVE NEGATIVE mg/dL   Hgb urine dipstick MODERATE (A) NEGATIVE   Bilirubin Urine NEGATIVE NEGATIVE   Ketones, ur NEGATIVE NEGATIVE mg/dL   Protein, ur NEGATIVE NEGATIVE mg/dL   Nitrite NEGATIVE NEGATIVE   Leukocytes, UA SMALL (A) NEGATIVE   RBC / HPF 6-30 0 - 5 RBC/hpf   WBC, UA 0-5 0 - 5 WBC/hpf   Bacteria, UA RARE (A) NONE SEEN   Squamous Epithelial / LPF 0-5 (A) NONE SEEN   Mucous PRESENT     Ct Tibia Fibula Left W Contrast  Result Date: 06/07/2016 CLINICAL DATA:  Dropped a 5 gallon water jug on left foot in Jan. Trouble with foot and leg since. EXAM: CT OF THE LOWER LEFT TIBIA AND FIBULA WITH CONTRAST CT OF THE LOWER LEFT FOOT WITH CONTRAST TECHNIQUE: Multidetector CT imaging of the lower left tibia and fibula was performed according to the standard protocol following intravenous contrast administration. Multidetector CT imaging of the  lower left foot was performed according to the standard protocol following intravenous contrast administration. COMPARISON:  None. CONTRAST:  123m ISOVUE-300 IOPAMIDOL (ISOVUE-300)  INJECTION 61%, 1019mISOVUE-300 IOPAMIDOL (ISOVUE-300) INJECTION 61% FINDINGS: Bones/Joint/Cartilage No acute fracture or dislocation of the left tibia or fibula. No acute fracture or dislocation of the left foot. Hallux valgus of the left foot. Prior osteotomies of the second and fifth metacarpal necks. Prior osteotomy of the calcaneus. Normal alignment. No joint effusion. 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 Subchondral sclerosis and cystic changes in the medial femoral condyle and medial tibial plateau. Tricompartmental marginal osteophytes. Moderate osteoarthritis of the tibiotalar joint. Intact ankle mortise. Mild osteoarthritis of the first MTP joint. Mild osteoarthritis of the first IP joint. Mild osteoarthritis of the second, third and fourth PIP and DIP joint. No aggressive lytic or sclerotic osseous lesion. Tiny plantar calcaneal spur. Ligaments Ligaments are suboptimally evaluated by CT. Muscles and Tendons The muscles are normal. No muscle atrophy. No intramuscular hematoma or fluid collection. Soft tissue 5.1 x 2.4 cm fat density mass in the anterior subcutaneous fat of the proximal left lower leg with an adjacent coarse calcification likely reflecting an area of fat necrosis versus less likely lipoma. Mild soft tissue edema in the right lower leg and foot. No focal fluid collection or hematoma. IMPRESSION: 1.  No acute osseous injury of the left tibia or fibula. 2.  No acute osseous injury of the left foot. 3. No soft tissue hematoma. 4. 5.1 x 2.4 cm fat density mass in the anterior subcutaneous fat of the proximal left lower leg with an adjacent coarse calcification likely reflecting an area of fat necrosis versus less likely lipoma. 5. 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 6. 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 Electronically Signed   By: HeKathreen Devoid On:  06/07/2016 09:43   Ct Foot Left W Contrast  Result Date: 06/07/2016 CLINICAL DATA:  Dropped a 5 gallon water jug on left foot in Jan. Trouble with foot and leg since. EXAM: CT OF THE LOWER LEFT TIBIA AND FIBULA WITH CONTRAST CT OF THE LOWER LEFT FOOT WITH CONTRAST TECHNIQUE: Multidetector CT imaging of the lower left tibia and fibula was performed according to the standard protocol following intravenous contrast administration. Multidetector CT imaging of the lower left foot was performed according to the standard protocol following intravenous contrast administration. COMPARISON:  None. CONTRAST:  10060mSOVUE-300 IOPAMIDOL (ISOVUE-300) INJECTION 61%, 100m20mOVUE-300 IOPAMIDOL (ISOVUE-300) INJECTION 61% FINDINGS: Bones/Joint/Cartilage No acute fracture or dislocation of the left tibia or fibula. No acute fracture or dislocation of the left foot. Hallux valgus of the left foot. Prior osteotomies of the second and fifth metacarpal necks. Prior osteotomy of the calcaneus. Normal alignment. No joint effusion. 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 Subchondral sclerosis and cystic changes in the medial femoral condyle and medial tibial plateau. Tricompartmental marginal osteophytes. Moderate osteoarthritis of the tibiotalar joint. Intact ankle mortise. Mild osteoarthritis of the first MTP joint. Mild osteoarthritis of the first IP joint. Mild osteoarthritis of the second, third and fourth PIP and DIP joint. No aggressive lytic or sclerotic osseous lesion. Tiny plantar calcaneal spur. Ligaments Ligaments are suboptimally evaluated by CT. Muscles and Tendons The muscles are normal. No muscle atrophy. No intramuscular hematoma or fluid collection. Soft tissue 5.1 x 2.4 cm fat density mass in the anterior  subcutaneous fat of the proximal left lower leg with an adjacent coarse calcification likely reflecting an area of  fat necrosis versus less likely lipoma. Mild soft tissue edema in the right lower leg and foot. No focal fluid collection or hematoma. IMPRESSION: 1.  No acute osseous injury of the left tibia or fibula. 2.  No acute osseous injury of the left foot. 3. No soft tissue hematoma. 4. 5.1 x 2.4 cm fat density mass in the anterior subcutaneous fat of the proximal left lower leg with an adjacent coarse calcification likely reflecting an area of fat necrosis versus less likely lipoma. 5. 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 6. 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 Electronically Signed   By: Kathreen Devoid   On: 06/07/2016 09:43    Review of Systems  Constitutional: Positive for fever.  HENT: Negative.   Eyes: Negative.   Respiratory: Negative.   Cardiovascular: Positive for leg swelling.  Gastrointestinal: Negative.   Genitourinary: Negative.   Musculoskeletal: Positive for joint pain.  Skin: Negative.   Neurological: Negative.   Psychiatric/Behavioral: Negative.    Blood pressure (!) 113/51, pulse 69, temperature 98.7 F (37.1 C), temperature source Oral, resp. rate 18, height 5' 9"  (1.753 m), weight 116 kg (255 lb 11.7 oz), SpO2 99 %. Physical Exam  Constitutional: She is oriented to person, place, and time. She appears well-developed and well-nourished.  HENT:  Head: Normocephalic and atraumatic.  Eyes: EOM are normal. Pupils are equal, round, and reactive to light.  Cardiovascular: Normal rate.   Respiratory: Effort normal. No respiratory distress.  GI: Soft. She exhibits no distension. There is no tenderness.  Musculoskeletal:       Legs: Neurological: She is alert and oriented to person, place, and time.  Skin: Skin is warm. Rash noted. There is erythema.  Psychiatric: She has a normal mood and affect. Her behavior is normal. Judgment and thought content normal.    Assessment/Plan: Plan for OR tomorrow for debridement and bx.  Dr. Tommy Medal aware and we  will notify him tomorrow when finished.  Minimize sugar and increase protein.  Wallace Going 06/08/2016, 3:44 PM

## 2016-06-09 NOTE — Interval H&P Note (Signed)
History and Physical Interval Note:  06/09/2016 7:17 AM  [name] J Bardales  has presented today for surgery, with the diagnosis of leg mass  The various methods of treatment have been discussed with the patient and family. After consideration of risks, benefits and other options for treatment, the patient has consented to  Procedure(s): BIOPSY LEFT LEG (Left) as a surgical intervention .  The patient's history has been reviewed, patient examined, no change in status, stable for surgery.  I have reviewed the patient's chart and labs.  Questions were answered to the patient's satisfaction.     Peggye FormLAIRE S DILLINGHAM

## 2016-06-09 NOTE — Progress Notes (Addendum)
Subjective: No new complaints   Antibiotics:  Anti-infectives    Start     Dose/Rate Route Frequency Ordered Stop   06/06/16 1530  metroNIDAZOLE (FLAGYL) IVPB 500 mg  Status:  Discontinued     500 mg 100 mL/hr over 60 Minutes Intravenous Every 8 hours 06/06/16 1520 06/07/16 1821   06/06/16 1300  cefTRIAXone (ROCEPHIN) 2 g in dextrose 5 % 50 mL IVPB     2 g 100 mL/hr over 30 Minutes Intravenous Every 24 hours 06/06/16 1220     06/06/16 0000  vancomycin (VANCOCIN) IVPB 1000 mg/200 mL premix  Status:  Discontinued     1,000 mg 200 mL/hr over 60 Minutes Intravenous Every 12 hours 06/05/16 1742 06/07/16 1437   06/05/16 2200  vancomycin (VANCOCIN) IVPB 1000 mg/200 mL premix  Status:  Discontinued     1,000 mg 200 mL/hr over 60 Minutes Intravenous Every 12 hours 06/05/16 1034 06/05/16 1543   06/05/16 1900  piperacillin-tazobactam (ZOSYN) IVPB 3.375 g  Status:  Discontinued     3.375 g 12.5 mL/hr over 240 Minutes Intravenous Every 8 hours 06/05/16 1034 06/05/16 1543   06/05/16 1800  piperacillin-tazobactam (ZOSYN) IVPB 3.375 g  Status:  Discontinued     3.375 g 12.5 mL/hr over 240 Minutes Intravenous Every 8 hours 06/05/16 1745 06/06/16 1220   06/05/16 1530  piperacillin-tazobactam (ZOSYN) IVPB 3.375 g  Status:  Discontinued     3.375 g 100 mL/hr over 30 Minutes Intravenous  Once 06/05/16 1521 06/05/16 1521   06/05/16 1530  vancomycin (VANCOCIN) IVPB 1000 mg/200 mL premix  Status:  Discontinued     1,000 mg 200 mL/hr over 60 Minutes Intravenous  Once 06/05/16 1521 06/05/16 1521   06/05/16 1230  vancomycin (VANCOCIN) IVPB 1000 mg/200 mL premix     1,000 mg 200 mL/hr over 60 Minutes Intravenous  Once 06/05/16 1118 06/05/16 1348   06/05/16 1130  vancomycin (VANCOCIN) IVPB 1000 mg/200 mL premix     1,000 mg 200 mL/hr over 60 Minutes Intravenous  Once 06/05/16 1118 06/05/16 1239   06/05/16 1030  vancomycin (VANCOCIN) 2,000 mg in sodium chloride 0.9 % 500 mL IVPB  Status:   Discontinued     2,000 mg 250 mL/hr over 120 Minutes Intravenous  Once 06/05/16 1014 06/05/16 1118   06/05/16 1015  piperacillin-tazobactam (ZOSYN) IVPB 3.375 g  Status:  Discontinued     3.375 g 100 mL/hr over 30 Minutes Intravenous  Once 06/05/16 1014 06/05/16 1031   06/05/16 1000  piperacillin-tazobactam (ZOSYN) IVPB 3.375 g     3.375 g 100 mL/hr over 30 Minutes Intravenous  Once 06/05/16 0945 06/05/16 1112   06/05/16 1000  vancomycin (VANCOCIN) IVPB 1000 mg/200 mL premix  Status:  Discontinued     1,000 mg 200 mL/hr over 60 Minutes Intravenous  Once 06/05/16 0945 06/05/16 1031      Medications: Scheduled Meds: . enoxaparin (LOVENOX) injection  0.5 mg/kg Subcutaneous Q24H  . gabapentin  300 mg Oral QHS  . losartan  100 mg Oral Daily   And  . hydrochlorothiazide  12.5 mg Oral Daily  . LORazepam  2 mg Intravenous Once   Continuous Infusions: . cefTRIAXone (ROCEPHIN)  IV Stopped (06/08/16 1355)   PRN Meds:.ALPRAZolam, hydrALAZINE, HYDROcodone-acetaminophen, HYDROmorphone (DILAUDID) injection, ondansetron **OR** ondansetron (ZOFRAN) IV    Objective: Weight change:   Intake/Output Summary (Last 24 hours) at 06/09/16 1142 Last data filed at 06/09/16 0845  Gross per 24 hour  Intake  1165 ml  Output               10 ml  Net             1155 ml   Blood pressure (!) 142/66, pulse 68, temperature 97.8 F (36.6 C), temperature source Oral, resp. rate 18, height 5\' 9"  (1.753 m), weight 255 lb 11.7 oz (116 kg), SpO2 97 %. Temp:  [97.7 F (36.5 C)-98.7 F (37.1 C)] 97.8 F (36.6 C) (05/04 1107) Pulse Rate:  [64-71] 68 (05/04 1107) Resp:  [12-19] 18 (05/04 1107) BP: (113-142)/(50-66) 142/66 (05/04 1107) SpO2:  [97 %-100 %] 97 % (05/04 1107)  Physical Exam: General: Alert and awake, oriented x3, not in any acute distress. HEENT: anicteric sclera,  EOMI, Pulmonary: cno wheezing, or resp distress Gastrointestinal: soft nontender, nondistended, normal bowel  sounds, Musculoskeletal/skin   Foot and toe 06/05/16:      Toe 06/07/16:     Leg 06/05/16:      Leg 06/07/16:       06/08/16:      Site bandaged today  Neuro: nonfocal  CBC: CBC Latest Ref Rng & Units 06/08/2016 06/07/2016 06/06/2016  WBC 4.0 - 10.5 K/uL 9.9 9.1 9.9  Hemoglobin 12.0 - 15.0 g/dL 11.2(L) 10.3(L) 10.6(L)  Hematocrit 36.0 - 46.0 % 34.1(L) 31.4(L) 32.4(L)  Platelets 150 - 400 K/uL 326 311 323      BMET  Recent Labs  06/08/16 0611 06/09/16 0935  NA 141 140  K 3.3* 3.4*  CL 105 105  CO2 27 26  GLUCOSE 92 112*  BUN 14 18  CREATININE 0.67 0.60  CALCIUM 9.0 8.8*     Liver Panel  No results for input(s): PROT, ALBUMIN, AST, ALT, ALKPHOS, BILITOT, BILIDIR, IBILI in the last 72 hours.     Sedimentation Rate  Recent Labs  06/08/16 0611  ESRSEDRATE 85*   C-Reactive Protein  Recent Labs  06/08/16 0611  CRP 7.2*    Micro Results: Recent Results (from the past 720 hour(s))  Blood Culture (routine x 2)     Status: None (Preliminary result)   Collection Time: 06/05/16  9:35 AM  Result Value Ref Range Status   Specimen Description BLOOD LEFT ARM  Final   Special Requests   Final    BOTTLES DRAWN AEROBIC AND ANAEROBIC Blood Culture adequate volume   Culture   Final    NO GROWTH 3 DAYS Performed at [facility], [address] [place], [place] [address]    Report Status PENDING  Incomplete  Blood Culture (routine x 2)     Status: None (Preliminary result)   Collection Time: 06/05/16 10:20 AM  Result Value Ref Range Status   Specimen Description BLOOD LEFT ARM  Final   Special Requests   Final    BOTTLES DRAWN AEROBIC AND ANAEROBIC Blood Culture adequate volume   Culture   Final    NO GROWTH 3 DAYS Performed at [facility], [address] [place], [place] [address]    Report Status PENDING  Incomplete  Surgical pcr screen     Status: None   Collection Time: 06/09/16  1:07 AM  Result Value Ref  Range Status   MRSA, PCR NEGATIVE NEGATIVE Final   Staphylococcus aureus NEGATIVE NEGATIVE Final    Comment:        The Xpert SA Assay (FDA approved for NASAL specimens in patients over 73 years of age), is one component of a comprehensive surveillance program.  Test performance has been validated by [facility] for patients greater than or equal to 71 year old. It is not intended to diagnose infection nor to guide or monitor treatment.     Studies/Results: No results found.    Assessment/Plan:  INTERVAL HISTORY: Dr. Ulice Bold has seen pt and to take her to the OR tomorrow   Principal Problem:   Mycobacterium infection, atypical Active Problems:   Hyperlipidemia   HTN (hypertension)   Cellulitis   Lymphangitis   Abscess   Morbid obesity (HCC)    Rita Watkins is a 61 y.o. female with  61 y.o. female with hx of frequent pedicures here in [place] last one shortly before Xmas holidays, sp trauma to foot with large fresh water dispending jug, ensuing swollen tender toe and feet which have progressed in ensuring 5 months despite treatment with keflex and clindamycin and local wound care by podiatry. Patient has lost nail and toe is still quite tender and necrotic appearing. She now has likely lymphangitic spread of this infection to her lower calf and thigh where there are areas of intensely painful necrosis.   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  I have worked for 2 hours in terms of face to face counselling the patient and coordination of care with Plastic surgery, ID pharmacy, CM, micro lab and pathology.  UNFORTUNATELY MICRO lab ONLY has SWABS for cultures  I would be PROFOUNDLY SHOCKED IF a MYCOBACTERIUM grew from a swab  We may very well be able to ID the organism from PCR from path to [facility] but the CULTURE  and SENSITIVITIES of likely MYCOBACTERIUM IS ABSOLUTELY ESSENTIAL as these organisms have variable R and M abscessus is frequently MDR  She will need return trip to the OR for repeat debridement and maximum amount of tissue possible sent for   AFB stain and culture  The if any left could also send for fungal stain and culture  Patient herself wants to go home today to take care of her dog. She does not have enough help to have someone take care of him.  She tells me she could come back next week  IF SHE HAS MYCOBACTERIAL INFECTION SHE WILL NEED 6 MONTHS OF DUAL OR TRIPLE THERAPY  PART OF THIS WILL NEED TO BE CLARITHROMYCIN 500MG  BID WHICH IS ORAL AND WHICH WILL BE EXPENSIVE  CAN [facility] HELP WITH OBTAINING THIS DRUG FOR HER?  HER SECOND DRUG I WOULD START EMPIRICALLY AFTER I HAVE TISSUE CULTURES INCUBATNIG WOULD   EITHER BE  CONTINUOUS INFUSION OF IV CEFOXITIN OR  IV TYGACIL  THESE WOULD NEED TO BE PROVIDED BY CHARITY CARE WITH [facility] AGAIN FOR 6  MONTHS  I have put in consults to CM and OPAT.  Dr. Orvan Falconer is available this weekend for questions. I will be back Monday afternoon, Dr. Luciana Axe covering Tuesday and then I will be back on Wednesday again  ADDENDUM:   DR. 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 Avelox [Moxifloxacin Hcl In Nacl] Anaphylaxis  . Terbinafine Hcl Swelling  . Zocor [Simvastatin - High Dose] Anaphylaxis  . Montelukast Sodium Other (See Comments)    Insomnia  .  Pravastatin Other (See Comments)    Myalgias on 20mg     Discharge antibiotics: Per pharmacy protocol IV cefoxitin per continous infusion. INitially thought 12 grams per day should be proper dose  Along with   Clarithromycin 500mg  bid orally  ) Duration: 6 months End Date: TBD  PIC Care Per Protocol:  Labs weekly while on IV antibiotics: _x_ CBC with differential _x_ BMP   __ Please pull PIC at completion of IV antibiotics _x_ Please leave PIC in place until doctor has seen patient or been notified  Fax weekly labs to [phone]  Clinic Follow Up Appt:  Next 3 weeks      LOS: 4 days   Acey Lav 06/09/2016, 11:42 AM

## 2016-06-09 NOTE — Progress Notes (Signed)
Pt was informed to follow up as soon as possible with Dr. Daiva EvesVan Dam. Patient was given the office number and the address. Pt aware that she needs to follow up with Dr Daiva EvesVan Dam.

## 2016-06-09 NOTE — Transfer of Care (Signed)
Immediate Anesthesia Transfer of Care Note  Patient: Rita Watkins  Procedure(s) Performed: Procedure(s): BIOPSY LEFT LEG (Left)  Patient Location: PACU  Anesthesia Type:General  Level of Consciousness:  sedated, patient cooperative and responds to stimulation  Airway & Oxygen Therapy:Patient Spontanous Breathing and Patient connected to face mask oxgen  Post-op Assessment:  Report given to PACU RN and Post -op Vital signs reviewed and stable  Post vital signs:  Reviewed and stable  Last Vitals:  Vitals:   06/08/16 2158 06/09/16 0514  BP: (!) 120/55 134/64  Pulse: 71 71  Resp: 18 18  Temp: 36.9 C 36.6 C    Complications: No apparent anesthesia complications

## 2016-06-09 NOTE — Anesthesia Postprocedure Evaluation (Addendum)
Anesthesia Post Note  Patient: Rita Watkins  Procedure(s) Performed: Procedure(s) (LRB): BIOPSY LEFT LEG (Left)  Patient location during evaluation: PACU Anesthesia Type: General Level of consciousness: sedated Pain management: pain level controlled Vital Signs Assessment: post-procedure vital signs reviewed and stable Respiratory status: spontaneous breathing and respiratory function stable Cardiovascular status: stable Anesthetic complications: no       Last Vitals:  Vitals:   06/09/16 0830 06/09/16 0845  BP: (!) 129/53   Pulse: 68   Resp: 19   Temp:  36.6 C    Last Pain:  Vitals:   06/09/16 0845  TempSrc:   PainSc: 0-No pain                 Gus Littler DANIEL

## 2016-06-09 NOTE — Progress Notes (Signed)
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 To discharging provider:  please sign these orders via discharge navigator,  Select New Orders & click on the button choice - Manage This Unsigned Work.    Please note that patient will also need a prescription issued for clarithromycin 500 mg BID  Thank you for allowing pharmacy to be a part of this patient's care.  Clayburn Pert, PharmD, BCPS Pager: [phone] 06/09/2016  3:14 PM

## 2016-06-09 NOTE — Anesthesia Procedure Notes (Signed)
Procedure Name: LMA Insertion Date/Time: 06/09/2016 7:31 AM Performed by: Epimenio SarinJARVELA, Lacretia Tindall R Pre-anesthesia Checklist: Patient identified, Emergency Drugs available, Suction available, Patient being monitored and Timeout performed Patient Re-evaluated:Patient Re-evaluated prior to inductionOxygen Delivery Method: Circle system utilized Preoxygenation: Pre-oxygenation with 100% oxygen Intubation Type: IV induction Ventilation: Mask ventilation without difficulty LMA: LMA with gastric port inserted LMA Size: 4.0 Number of attempts: 1 Dental Injury: Teeth and Oropharynx as per pre-operative assessment

## 2016-06-09 NOTE — Progress Notes (Signed)
Notified by patient and family friend that unhappy with patient care during hospital stay.  Complaints involve incomplete communication to patient, tissue sample issues from I&D, PICC placement and etc.  I attempted service recovery and apologized, pt requesting high level reporting.  Patient experience number given to pt and family friend.  MD, ID and etc notified by primary RN Danielle regarding pt complaints and requests for imminent discharge.

## 2016-06-09 NOTE — Progress Notes (Signed)
Dillingham performed a bedside biopsy on patient today.

## 2016-06-09 NOTE — Progress Notes (Signed)
Date: Jun 09, 2016 Discharge orders review for case management needs.  [facility] Marcelle Smilinghonda Kanisha Duba, BSN, [email or web address], CCM: [phone]

## 2016-06-09 NOTE — Op Note (Signed)
DATE OF OPERATION: 06/09/2016  LOCATION: [facility]  PREOPERATIVE DIAGNOSIS: left leg wound  POSTOPERATIVE DIAGNOSIS: Same  PROCEDURE: Excisional debridement of left leg wound: Proximal 3 x 4 cm, Distal 2 x 3 cm Biopsy 1 cm of left thigh  SURGEON: Claire Sanger Dillingham, DO  EBL: 10 cc  CONDITION: Stable  COMPLICATIONS: None  INDICATION: The patient, Rita Watkins, is a 61 y.o. female born on October 29, 1955, is here for treatment of a left thigh wound and path/histology.   PROCEDURE DETAILS:  The patient was seen prior to surgery and marked.  The IV antibiotics were given. The patient was taken to the operating room and given a general anesthetic. A standard time out was performed and all information was confirmed by those in the room. SCD was placed on the right leg.   The proximal wound was excised for debridement 3 x 4 cm of necrotic tissue, skin and fat.  Hemostasis was achieved with electrocautery.  The area was then irrigated with saline.  The distal wound was excised for debridement 2 x 3 cm skin, necrosis and fat. Hemostasis achieved with electrocautery. A 1 cm skin biopsy was obtained from the anterior proximal area of the left thigh adjacent to the wound.  Wet to dry saline dressings were placed. The patient was allowed to wake up and taken to recovery room in stable condition at the end of the case. The family was notified at the end of the case.

## 2016-06-10 LAB — ACID FAST SMEAR (AFB, MYCOBACTERIA): Acid Fast Smear: NEGATIVE

## 2016-06-10 LAB — CULTURE, BLOOD (ROUTINE X 2)
Culture: NO GROWTH
Culture: NO GROWTH
SPECIAL REQUESTS: ADEQUATE
Special Requests: ADEQUATE

## 2016-06-10 LAB — ACID FAST SMEAR (AFB)
ACID FAST SMEAR - AFSCU2: NEGATIVE
ACID FAST SMEAR - AFSCU2: NEGATIVE

## 2016-06-10 NOTE — Discharge Summary (Addendum)
Physician Discharge Summary  Rita Watkins CZY:[id number] DOB: 1955-03-04 DOA: 06/05/2016  PCP: Mackie Pai, PA-C  Admit date: 06/05/2016 Discharge date: 06/10/2016  Admitted From: home  Disposition:  home   Recommendations for Outpatient Follow-up:  1. f/u with ID, Dr Tommy Medal  Home Health:  RN (to help with dressing changed) and Aid  Equipment/Devices:  none    Discharge Condition:  stable   CODE STATUS:  Full code   Diet recommendation:  Heart herlthy Consultations:  ID    Discharge Diagnoses:  Principal Problem:   Mycobacterium infection, atypical Active Problems:   Cellulitis   Abscess   Hyperlipidemia   HTN (hypertension)   Lymphangitis   Morbid obesity ([place])    Subjective: No complaints. Adamant about going home today. She does not want a PICC line or empiric antibiotics as recommended by ID. She understands that it may be weeks before cultures result. She states she will f/u with Dr Tommy Medal in the office next week.   Brief Summary: 61 year old female with a history of hypertension, hyperlipidemia, and depression presented with one-week history of increasing pain, erythema, and edema on her left side. The patient first noticed a "scab" on the medial aspect of her left thigh on 05/31/2016 while in the shower. Over the next 2-3 days, she noticed additional scabs that began the form with associated increasing edema, erythema and pain.She denied fevers, chills.   She has been seeing a podiatrist for left 4th toe infection and has been on different rounds of antibiotics since Feb. Most recently, the patient was started on clindamycin 300 mg every 8 hours on 05/23/2016 with which the patient endorses compliance.She feels that the erythema on her left foot and necrotic tissue on her left fourth toe has been improving.    Hospital Course:  Principal Problem:   Cellulitis/ necrosis - left thigh - occurred while taking Clindamycin as out patient  - 06/05/16 [place] left  leg--neg for DVT - CRP 10.2, ESR 84 CK 34,  - appears to be improving -started off with necrotic skin- area is indurated as well-  CT shows an area of possible fat necrosis-see report below-  have consulted Ortho and ID for advice regarding treatment plan - ortho recommending plastic surgery consult- I have spoken with Dr Marla Roe and ID. ID feels that this is related to the necrosis of the toe and possible mycobacterial infection related to a pedicure.  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 - repeat  UA continues to show blood- I discussed doing further work up of this     HTN (hypertension) -continue losartan/HCTZ  Panic Attacks -continue xanax prn  COPD -stable     Discharge Instructions  Discharge Instructions    Diet - low sodium heart healthy    Complete by:  As directed    Increase activity slowly    Complete by:  As directed      Allergies as of 06/09/2016      Reactions   Avelox [moxifloxacin Hcl In Nacl] Anaphylaxis   Terbinafine Hcl Swelling  Zocor [simvastatin - High Dose] Anaphylaxis   Montelukast Sodium Other (See Comments)   Insomnia   Pravastatin Other (See Comments)   Myalgias on 31m      Medication List    STOP taking these medications   clindamycin 300 MG capsule Commonly known as:  CLEOCIN     TAKE these medications   acetaminophen 325 MG tablet Commonly known as:  TYLENOL Take 650 mg by mouth every 6 (six) hours as needed.   ALPRAZolam 0.5 MG tablet Commonly known as:  XANAX Take 1 tablet (0.5 mg total) by mouth 2  (two) times daily as needed for anxiety.   gabapentin 300 MG capsule Commonly known as:  NEURONTIN Take 1 capsule (300 mg total) by mouth at bedtime.   HYDROcodone-acetaminophen 5-325 MG tablet Commonly known as:  NORCO/VICODIN Take 1 tablet by mouth every 6 (six) hours as needed for moderate pain.   ibuprofen 200 MG tablet Commonly known as:  ADVIL,MOTRIN Take 800 mg by mouth every 6 (six) hours as needed.   losartan-hydrochlorothiazide 100-12.5 MG tablet Commonly known as:  HYZAAR Take 1 tablet by mouth daily.   vitamin C 500 MG tablet Commonly known as:  ASCORBIC ACID Take 500 mg by mouth daily.      Follow-up Information    VTommy Medal CLavell Islam MD Follow up.   Specialty:  Infectious Diseases Why:  Call Dr. VTommy Medalas soon as possible for folow up  Contact information: [phone]         Allergies  Allergen Reactions  . Avelox [Moxifloxacin Hcl In Nacl] Anaphylaxis  . Terbinafine Hcl Swelling  . Zocor [Simvastatin - High Dose] Anaphylaxis  . Montelukast Sodium Other (See Comments)    Insomnia  . Pravastatin Other (See Comments)    Myalgias on 27m    Procedures/Studies: 06/09/16  Excisional debridement of left leg wound: Proximal 3 x 4 cm, Distal 2 x 3 cm Biopsy 1 cm of left thigh  Ct Tibia Fibula Left W Contrast  Result Date: 06/07/2016 CLINICAL DATA:  Dropped a 5 gallon water jug on left foot in Jan. Trouble with foot and leg since. EXAM: CT OF THE LOWER LEFT TIBIA AND FIBULA WITH CONTRAST CT OF THE LOWER LEFT FOOT WITH CONTRAST TECHNIQUE: Multidetector CT imaging of the lower left tibia and fibula was performed according to the standard protocol following intravenous contrast administration. Multidetector CT imaging of the lower left foot was performed according to the standard protocol following intravenous contrast administration. COMPARISON:  None. CONTRAST:  10037mSOVUE-300 IOPAMIDOL (ISOVUE-300)  INJECTION 61%, 100m73mOVUE-300 IOPAMIDOL (ISOVUE-300) INJECTION 61% FINDINGS: Bones/Joint/Cartilage No acute fracture or dislocation of the left tibia or fibula. No acute fracture or dislocation of the left foot. Hallux valgus of the left foot. Prior osteotomies of the second and fifth metacarpal necks. Prior osteotomy of the calcaneus. Normal alignment. No joint effusion. 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 Subchondral sclerosis and cystic changes in the medial femoral condyle and medial tibial plateau. Tricompartmental marginal osteophytes. Moderate osteoarthritis of the tibiotalar joint. Intact ankle mortise. Mild osteoarthritis of the first MTP joint. Mild osteoarthritis of the first IP joint. Mild osteoarthritis of the second, third and fourth PIP and DIP joint. No aggressive lytic or sclerotic osseous lesion. Tiny plantar calcaneal spur. Ligaments Ligaments are suboptimally evaluated by CT. Muscles and Tendons The muscles are normal. No muscle atrophy.  No intramuscular hematoma or fluid collection. Soft tissue 5.1 x 2.4 cm fat density mass in the anterior subcutaneous fat of the proximal left lower leg with an adjacent coarse calcification likely reflecting an area of fat necrosis versus less likely lipoma. Mild soft tissue edema in the right lower leg and foot. No focal fluid collection or hematoma. IMPRESSION: 1.  No acute osseous injury of the left tibia or fibula. 2.  No acute osseous injury of the left foot. 3. No soft tissue hematoma. 4. 5.1 x 2.4 cm fat density mass in the anterior subcutaneous fat of the proximal left lower leg with an adjacent coarse calcification likely reflecting an area of fat necrosis versus less likely lipoma. 5. 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 6. 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 Electronically Signed   By: Kathreen Devoid   On:  06/07/2016 09:43   Ct Foot Left W Contrast  Result Date: 06/07/2016 CLINICAL DATA:  Dropped a 5 gallon water jug on left foot in Jan. Trouble with foot and leg since. EXAM: CT OF THE LOWER LEFT TIBIA AND FIBULA WITH CONTRAST CT OF THE LOWER LEFT FOOT WITH CONTRAST TECHNIQUE: Multidetector CT imaging of the lower left tibia and fibula was performed according to the standard protocol following intravenous contrast administration. Multidetector CT imaging of the lower left foot was performed according to the standard protocol following intravenous contrast administration. COMPARISON:  None. CONTRAST:  163m ISOVUE-300 IOPAMIDOL (ISOVUE-300) INJECTION 61%, 1023mISOVUE-300 IOPAMIDOL (ISOVUE-300) INJECTION 61% FINDINGS: Bones/Joint/Cartilage No acute fracture or dislocation of the left tibia or fibula. No acute fracture or dislocation of the left foot. Hallux valgus of the left foot. Prior osteotomies of the second and fifth metacarpal necks. Prior osteotomy of the calcaneus. Normal alignment. No joint effusion. 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 Subchondral sclerosis and cystic changes in the medial femoral condyle and medial tibial plateau. Tricompartmental marginal osteophytes. Moderate osteoarthritis of the tibiotalar joint. Intact ankle mortise. Mild osteoarthritis of the first MTP joint. Mild osteoarthritis of the first IP joint. Mild osteoarthritis of the second, third and fourth PIP and DIP joint. No aggressive lytic or sclerotic osseous lesion. Tiny plantar calcaneal spur. Ligaments Ligaments are suboptimally evaluated by CT. Muscles and Tendons The muscles are normal. No muscle atrophy. No intramuscular hematoma or fluid collection. Soft tissue 5.1 x 2.4 cm fat density mass in the anterior subcutaneous fat of the proximal left lower leg with an adjacent coarse calcification likely reflecting an area of  fat necrosis versus less likely lipoma. Mild soft tissue edema in the right lower leg and foot. No focal fluid collection or hematoma. IMPRESSION: 1.  No acute osseous injury of the left tibia or fibula. 2.  No acute osseous injury of the left foot. 3. No soft tissue hematoma. 4. 5.1 x 2.4 cm fat density mass in the anterior subcutaneous fat of the proximal left lower leg with an adjacent coarse calcification likely reflecting an area of fat necrosis versus less likely lipoma. 5. 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 6. 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 Electronically Signed   By: HeKathreen Devoid On: 06/07/2016 09:43   Us[place]enous Img Lower Unilateral Left  Result Date: 06/05/2016 CLINICAL DATA:  Left lower leg pain, erythema and swelling for 2 weeks. History of left foot infection. EXAM: LEFT LOWER EXTREMITY VENOUS DOPPLER ULTRASOUND TECHNIQUE: Gray-scale sonography with graded compression,  as well as color Doppler and duplex ultrasound were performed to evaluate the lower extremity deep venous systems from the level of the common femoral vein and including the common femoral, femoral, profunda femoral, popliteal and calf veins including the posterior tibial, peroneal and gastrocnemius veins when visible. The superficial great saphenous vein was also interrogated. Spectral Doppler was utilized to evaluate flow at rest and with distal augmentation maneuvers in the common femoral, femoral and popliteal veins. COMPARISON:  Foot radiographs 03/31/2016. FINDINGS: Contralateral Common Femoral Vein: Respiratory phasicity is normal and symmetric with the symptomatic side. No evidence of thrombus. Normal compressibility. Common Femoral Vein: No evidence of thrombus. Normal compressibility, respiratory phasicity and response to augmentation. Saphenofemoral Junction: No evidence of thrombus. Normal compressibility and flow on color Doppler imaging. Profunda Femoral Vein: No evidence of  thrombus. Normal compressibility and flow on color Doppler imaging. Femoral Vein: No evidence of thrombus. Normal compressibility, respiratory phasicity and response to augmentation. Popliteal Vein: No evidence of thrombus. Normal compressibility, respiratory phasicity and response to augmentation. Calf Veins: Suboptimally visualized.  No evidence of thrombus. Superficial Great Saphenous Vein: No evidence of thrombus. Normal compressibility and flow on color Doppler imaging. Other Findings: Assessment for augmentation was mildly limited by patient pain. Soft tissue edema noted in the lower leg. IMPRESSION: 1. No evidence of left lower extremity DVT. 2. Lower leg edema.  Mild limitations as above. Electronically Signed   By: Richardean Sale M.D.   On: 06/05/2016 11:21       Discharge Exam: Vitals:   06/09/16 1107 06/09/16 1414  BP: (!) 142/66 110/81  Pulse: 68 65  Resp: 18 18  Temp: 97.8 F (36.6 C) 97.7 F (36.5 C)   Vitals:   06/09/16 0908 06/09/16 1008 06/09/16 1107 06/09/16 1414  BP: (!) 135/52 (!) 135/50 (!) 142/66 110/81  Pulse: 64 65 68 65  Resp: 14 14 18 18   Temp: 97.8 F (36.6 C) 97.7 F (36.5 C) 97.8 F (36.6 C) 97.7 F (36.5 C)  TempSrc:  Oral Oral Oral  SpO2: 100% 97% 97% 99%  Weight:      Height:        General: Pt is alert, awake, not in acute distress Cardiovascular: RRR, S1/S2 +, no rubs, no gallops Respiratory: CTA bilaterally, no wheezing, no rhonchi Abdominal: Soft, NT, ND, bowel sounds + Extremities: no edema, no cyanosis    The results of significant diagnostics from this hospitalization (including imaging, microbiology, ancillary and laboratory) are listed below for reference.     Microbiology: Recent Results (from the past 240 hour(s))  Blood Culture (routine x 2)     Status: None   Collection Time: 06/05/16  9:35 AM  Result Value Ref Range Status   Specimen Description BLOOD LEFT ARM  Final   Special Requests   Final    BOTTLES DRAWN AEROBIC AND  ANAEROBIC Blood Culture adequate volume   Culture   Final    NO GROWTH 5 DAYS Performed at [facility], [address] [place],  [address]    Report Status 06/10/2016 FINAL  Final  Blood Culture (routine x 2)     Status: None   Collection Time: 06/05/16 10:20 AM  Result Value Ref Range Status   Specimen Description BLOOD LEFT ARM  Final   Special Requests   Final    BOTTLES DRAWN AEROBIC AND ANAEROBIC Blood Culture adequate volume   Culture   Final    NO GROWTH 5 DAYS Performed at [facility]  [facility], [place] [address] [place], [place] [address]    Report Status 06/10/2016 FINAL  Final  Surgical pcr screen     Status: None   Collection Time: 06/09/16  1:07 AM  Result Value Ref Range Status   MRSA, PCR NEGATIVE NEGATIVE Final   Staphylococcus aureus NEGATIVE NEGATIVE Final    Comment:        The Xpert SA Assay (FDA approved for NASAL specimens in patients over 61 years of age), is one component of a comprehensive surveillance program.  Test performance has been validated by [facility] for patients greater than or equal to 19 year old. It is not intended to diagnose infection nor to guide or monitor treatment.   Anaerobic culture     Status: None (Preliminary result)   Collection Time: 06/09/16  8:13 AM  Result Value Ref Range Status   Specimen Description WOUND LEFT THIGH PROXIMAL  Final   Special Requests NONE  Final   Gram Stain   Final    FEW WBC PRESENT,BOTH PMN AND MONONUCLEAR NO ORGANISMS SEEN Performed at [facility], [place] [address] [place], [place] [address]    Culture PENDING  Incomplete   Report Status PENDING  Incomplete  Aerobic Culture (superficial specimen)     Status: None (Preliminary result)   Collection Time: 06/09/16  8:13 AM  Result Value Ref Range Status   Specimen Description WOUND LEFT THIGH PROXIMAL  Final   Special Requests NONE  Final   Gram Stain NO WBC SEEN NO ORGANISMS SEEN   Final   Culture   Final    NO GROWTH  1 DAY Performed at [facility], [place] [address], [place], [place] [address]    Report Status PENDING  Incomplete  Aerobic/Anaerobic Culture (surgical/deep wound)     Status: None (Preliminary result)   Collection Time: 06/09/16  8:17 AM  Result Value Ref Range Status   Specimen Description WOUND LEFT THIGH MEDIAL  Final   Special Requests Normal  Final   Gram Stain   Final    FEW WBC PRESENT,BOTH PMN AND MONONUCLEAR NO ORGANISMS SEEN    Culture   Final    NO GROWTH 1 DAY Performed at [facility], [place] [address] [place],  [address]    Report Status PENDING  Incomplete     Labs: BNP (last 3 results) No results for input(s): BNP in the last 8760 hours. Basic Metabolic Panel:  Recent Labs Lab 06/05/16 0939 06/05/16 1611 06/06/16 0558 06/07/16 0546 06/08/16 0611 06/09/16 0935  NA 139  --  139 140 141 140  K 4.4  --  3.5 3.5 3.3* 3.4*  CL 102  --  103 105 105 105  CO2 28  --  28 27 27 26   GLUCOSE 103*  --  92 102* 92 112*  BUN 16  --  11 12 14 18   CREATININE 0.76 0.69 0.63 0.63 0.67 0.60  CALCIUM 9.2  --  8.8* 8.7* 9.0 8.8*   Liver Function Tests:  Recent Labs Lab 06/05/16 0939  AST 13*  ALT 11*  ALKPHOS 82  BILITOT 0.6  PROT 8.0  ALBUMIN 3.4*   No results for input(s): LIPASE, AMYLASE in the last 168 hours. No results for input(s): AMMONIA in the last 168 hours. CBC:  Recent Labs Lab 06/05/16 0939 06/05/16 1611 06/06/16 1043 06/07/16 0546 06/08/16 0611  WBC 12.6* 13.2* 9.9 9.1 9.9  NEUTROABS 9.5*  --   --   --   --  HGB 11.9* 11.6* 10.6* 10.3* 11.2*  HCT 36.2 34.3* 32.4* 31.4* 34.1*  MCV 91.2 90.0 90.0 89.7 89.7  PLT 399 364 323 311 326   Cardiac Enzymes:  Recent Labs Lab 06/06/16 1043  CKTOTAL 34*   BNP: Invalid input(s): POCBNP CBG:  Recent Labs Lab 06/05/16 0925 06/06/16 0738 06/07/16 0739 06/08/16 0737  GLUCAP 105* 96 101* 85   D-Dimer No results for input(s): DDIMER in the last 72 hours. Hgb A1c No  results for input(s): HGBA1C in the last 72 hours. Lipid Profile No results for input(s): CHOL, HDL, LDLCALC, TRIG, CHOLHDL, LDLDIRECT in the last 72 hours. Thyroid function studies No results for input(s): TSH, T4TOTAL, T3FREE, THYROIDAB in the last 72 hours.  Invalid input(s): FREET3 Anemia work up No results for input(s): VITAMINB12, FOLATE, FERRITIN, TIBC, IRON, RETICCTPCT in the last 72 hours. Urinalysis    Component Value Date/Time   COLORURINE YELLOW 06/08/2016 [place] 06/08/2016 1244   LABSPEC 1.020 06/08/2016 1244   PHURINE 5.0 06/08/2016 1244   GLUCOSEU NEGATIVE 06/08/2016 1244   HGBUR MODERATE (A) 06/08/2016 [place] 06/08/2016 [place] 06/08/2016 1244   PROTEINUR NEGATIVE 06/08/2016 1244   NITRITE NEGATIVE 06/08/2016 1244   LEUKOCYTESUR SMALL (A) 06/08/2016 1244   Sepsis Labs Invalid input(s): PROCALCITONIN,  WBC,  LACTICIDVEN Microbiology Recent Results (from the past 240 hour(s))  Blood Culture (routine x 2)     Status: None   Collection Time: 06/05/16  9:35 AM  Result Value Ref Range Status   Specimen Description BLOOD LEFT ARM  Final   Special Requests   Final    BOTTLES DRAWN AEROBIC AND ANAEROBIC Blood Culture adequate volume   Culture   Final    NO GROWTH 5 DAYS Performed at [facility], [address], [place], [place] [address]    Report Status 06/10/2016 FINAL  Final  Blood Culture (routine x 2)     Status: None   Collection Time: 06/05/16 10:20 AM  Result Value Ref Range Status   Specimen Description BLOOD LEFT ARM  Final   Special Requests   Final    BOTTLES DRAWN AEROBIC AND ANAEROBIC Blood Culture adequate volume   Culture   Final    NO GROWTH 5 DAYS Performed at [facility], [place] [address], [place], [place] [address]    Report Status 06/10/2016 FINAL  Final  Surgical pcr screen     Status: None   Collection Time: 06/09/16  1:07 AM  Result Value Ref Range Status   MRSA,  PCR NEGATIVE NEGATIVE Final   Staphylococcus aureus NEGATIVE NEGATIVE Final    Comment:        The Xpert SA Assay (FDA approved for NASAL specimens in patients over 23 years of age), is one component of a comprehensive surveillance program.  Test performance has been validated by [facility] for patients greater than or equal to 18 year old. It is not intended to diagnose infection nor to guide or monitor treatment.   Anaerobic culture     Status: None (Preliminary result)   Collection Time: 06/09/16  8:13 AM  Result Value Ref Range Status   Specimen Description WOUND LEFT THIGH PROXIMAL  Final   Special Requests NONE  Final   Gram Stain   Final    FEW WBC PRESENT,BOTH PMN AND MONONUCLEAR NO ORGANISMS SEEN Performed at [facility], [place] [address] [place], [place] [address]  Culture PENDING  Incomplete   Report Status PENDING  Incomplete  Aerobic Culture (superficial specimen)     Status: None (Preliminary result)   Collection Time: 06/09/16  8:13 AM  Result Value Ref Range Status   Specimen Description WOUND LEFT THIGH PROXIMAL  Final   Special Requests NONE  Final   Gram Stain NO WBC SEEN NO ORGANISMS SEEN   Final   Culture   Final    NO GROWTH 1 DAY Performed at [facility], [place] [address] [place], [place] [address]    Report Status PENDING  Incomplete  Aerobic/Anaerobic Culture (surgical/deep wound)     Status: None (Preliminary result)   Collection Time: 06/09/16  8:17 AM  Result Value Ref Range Status   Specimen Description WOUND LEFT THIGH MEDIAL  Final   Special Requests Normal  Final   Gram Stain   Final    FEW WBC PRESENT,BOTH PMN AND MONONUCLEAR NO ORGANISMS SEEN    Culture   Final    NO GROWTH 1 DAY Performed at [facility], [place] [address] [place], [place] [address]    Report Status PENDING  Incomplete     Time coordinating discharge: Over 30 minutes  SIGNED:   Debbe Odea, MD  [facility] 06/10/2016, 4:47  PM Pager   If 7PM-7AM, please contact night-coverage [email or web address] Password [email or web address]

## 2016-06-12 ENCOUNTER — Encounter (HOSPITAL_COMMUNITY): Payer: Self-pay | Admitting: Plastic Surgery

## 2016-06-12 LAB — ACID FAST SMEAR (AFB)
ACID FAST SMEAR - AFSCU2: NEGATIVE
ACID FAST SMEAR - AFSCU2: NEGATIVE

## 2016-06-12 LAB — ACID FAST SMEAR (AFB, MYCOBACTERIA)

## 2016-06-12 MED FILL — ALPRAZolam 0.5 MG TABS: 0.5 | 30 days supply | Qty: 60 | Fill #2

## 2016-06-14 LAB — DRUG PROFILE, UR, 9 DRUGS (LABCORP)
AMPHETAMINES, URINE: NEGATIVE ng/mL
BARBITURATE, UR: NEGATIVE ng/mL
BENZODIAZEPINE QUANT UR: NEGATIVE
CANNABINOID QUANT UR: NEGATIVE ng/mL
COCAINE (METAB.): NEGATIVE ng/mL
Methadone Screen, Urine: NEGATIVE ng/mL
Opiate Quant, Ur: NEGATIVE
Phencyclidine, Ur: NEGATIVE ng/mL
Propoxyphene, Urine: NEGATIVE ng/mL

## 2016-06-20 ENCOUNTER — Ambulatory Visit: Payer: Self-pay | Admitting: Podiatry

## 2016-06-26 ENCOUNTER — Telehealth: Payer: Self-pay | Admitting: *Deleted

## 2016-06-26 ENCOUNTER — Telehealth: Payer: Self-pay | Admitting: Podiatry

## 2016-06-26 ENCOUNTER — Encounter (HOSPITAL_COMMUNITY): Payer: Self-pay | Admitting: Emergency Medicine

## 2016-06-26 ENCOUNTER — Inpatient Hospital Stay (HOSPITAL_COMMUNITY)
Admission: EM | Admit: 2016-06-26 | Discharge: 2016-06-30 | DRG: 854 | Disposition: A | Discharge status: Home / Self Care | Payer: Self-pay | Attending: Internal Medicine | Admitting: Internal Medicine

## 2016-06-26 DIAGNOSIS — I10 Essential (primary) hypertension: Secondary | ICD-10-CM | POA: Diagnosis present

## 2016-06-26 DIAGNOSIS — Z8249 Family history of ischemic heart disease and other diseases of the circulatory system: Secondary | ICD-10-CM

## 2016-06-26 DIAGNOSIS — Z888 Allergy status to other drugs, medicaments and biological substances status: Secondary | ICD-10-CM

## 2016-06-26 DIAGNOSIS — A319 Mycobacterial infection, unspecified: Principal | ICD-10-CM | POA: Diagnosis present

## 2016-06-26 DIAGNOSIS — R238 Other skin changes: Secondary | ICD-10-CM

## 2016-06-26 DIAGNOSIS — Z881 Allergy status to other antibiotic agents status: Secondary | ICD-10-CM

## 2016-06-26 DIAGNOSIS — L03116 Cellulitis of left lower limb: Secondary | ICD-10-CM | POA: Diagnosis present

## 2016-06-26 DIAGNOSIS — F17210 Nicotine dependence, cigarettes, uncomplicated: Secondary | ICD-10-CM | POA: Diagnosis present

## 2016-06-26 DIAGNOSIS — Z8349 Family history of other endocrine, nutritional and metabolic diseases: Secondary | ICD-10-CM

## 2016-06-26 DIAGNOSIS — J449 Chronic obstructive pulmonary disease, unspecified: Secondary | ICD-10-CM | POA: Diagnosis present

## 2016-06-26 DIAGNOSIS — Z792 Long term (current) use of antibiotics: Secondary | ICD-10-CM

## 2016-06-26 DIAGNOSIS — I891 Lymphangitis: Secondary | ICD-10-CM | POA: Diagnosis present

## 2016-06-26 DIAGNOSIS — Z6836 Body mass index (BMI) 36.0-36.9, adult: Secondary | ICD-10-CM

## 2016-06-26 DIAGNOSIS — Z825 Family history of asthma and other chronic lower respiratory diseases: Secondary | ICD-10-CM

## 2016-06-26 DIAGNOSIS — E876 Hypokalemia: Secondary | ICD-10-CM | POA: Diagnosis present

## 2016-06-26 DIAGNOSIS — Z79899 Other long term (current) drug therapy: Secondary | ICD-10-CM

## 2016-06-26 DIAGNOSIS — M1712 Unilateral primary osteoarthritis, left knee: Secondary | ICD-10-CM | POA: Diagnosis present

## 2016-06-26 DIAGNOSIS — Z597 Insufficient social insurance and welfare support: Secondary | ICD-10-CM

## 2016-06-26 DIAGNOSIS — R7989 Other specified abnormal findings of blood chemistry: Secondary | ICD-10-CM | POA: Diagnosis present

## 2016-06-26 LAB — BASIC METABOLIC PANEL
ANION GAP: 11 (ref 5–15)
BUN: 22 mg/dL — AB (ref 6–20)
CALCIUM: 9.1 mg/dL (ref 8.9–10.3)
CO2: 27 mmol/L (ref 22–32)
CREATININE: 0.99 mg/dL (ref 0.44–1.00)
Chloride: 99 mmol/L — ABNORMAL LOW (ref 101–111)
GFR calc Af Amer: >60 mL/min (ref >60)
GFR calc non Af Amer: 60 mL/min — ABNORMAL LOW (ref >60)
GLUCOSE: 105 mg/dL — AB (ref 65–99)
Potassium: 3.2 mmol/L — ABNORMAL LOW (ref 3.5–5.1)
Sodium: 137 mmol/L (ref 135–145)

## 2016-06-26 LAB — CBC WITH DIFFERENTIAL/PLATELET
BASOS ABS: 0 10*3/uL (ref 0.0–0.1)
BASOS PCT: 0 %
Eosinophils Absolute: 0.1 10*3/uL (ref 0.0–0.7)
Eosinophils Relative: 1 %
HCT: 33.3 % — ABNORMAL LOW (ref 36.0–46.0)
Hemoglobin: 10.9 g/dL — ABNORMAL LOW (ref 12.0–15.0)
Lymphocytes Relative: 23 %
Lymphs Abs: 3.1 10*3/uL (ref 0.7–4.0)
MCH: 29.2 pg (ref 26.0–34.0)
MCHC: 32.7 g/dL (ref 30.0–36.0)
MCV: 89.3 fL (ref 78.0–100.0)
Monocytes Absolute: 1.6 10*3/uL — ABNORMAL HIGH (ref 0.1–1.0)
Monocytes Relative: 12 %
NEUTROS ABS: 8.5 10*3/uL — AB (ref 1.7–7.7)
Neutrophils Relative %: 64 %
PLATELETS: 430 10*3/uL — AB (ref 150–400)
RBC: 3.73 MIL/uL — ABNORMAL LOW (ref 3.87–5.11)
RDW: 13.3 % (ref 11.5–15.5)
WBC: 13.2 10*3/uL — ABNORMAL HIGH (ref 4.0–10.5)

## 2016-06-26 MED ORDER — ONDANSETRON HCL 4 MG PO TABS: 4.0000 mg | ORAL_TABLET | Freq: Four times a day (QID) | ORAL | Status: DC | PRN

## 2016-06-26 MED ORDER — HYDROCHLOROTHIAZIDE 12.5 MG PO CAPS
12.5000 mg | ORAL_CAPSULE | Freq: Every day | ORAL | Status: DC
  Administered 2016-06-27 – 2016-06-30 (×4): 12.5 mg via ORAL
  Filled 2016-06-26 (×4): qty 1

## 2016-06-26 MED ORDER — ACETAMINOPHEN 650 MG RE SUPP: 650.0000 mg | Freq: Four times a day (QID) | RECTAL | Status: DC | PRN

## 2016-06-26 MED ORDER — ACETAMINOPHEN 325 MG PO TABS
650.0000 mg | ORAL_TABLET | Freq: Four times a day (QID) | ORAL | Status: DC | PRN
  Administered 2016-06-28 – 2016-06-30 (×3): 650 mg via ORAL
  Filled 2016-06-26 (×3): qty 2

## 2016-06-26 MED ORDER — ONDANSETRON HCL 4 MG/2ML IJ SOLN
4.0000 mg | Freq: Four times a day (QID) | INTRAMUSCULAR | Status: DC | PRN
  Administered 2016-06-27: 4 mg via INTRAVENOUS
  Filled 2016-06-26: qty 2

## 2016-06-26 MED ORDER — LOSARTAN POTASSIUM-HCTZ 100-12.5 MG PO TABS: 1.0000 | ORAL_TABLET | Freq: Every day | ORAL | Status: DC

## 2016-06-26 MED ORDER — OXYCODONE-ACETAMINOPHEN 5-325 MG PO TABS
1.0000 | ORAL_TABLET | Freq: Once | ORAL | Status: AC
  Administered 2016-06-26: 1 via ORAL
  Filled 2016-06-26: qty 1

## 2016-06-26 MED ORDER — LOSARTAN POTASSIUM 50 MG PO TABS
100.0000 mg | ORAL_TABLET | Freq: Every day | ORAL | Status: DC
  Administered 2016-06-27 – 2016-06-30 (×4): 100 mg via ORAL
  Filled 2016-06-26 (×4): qty 2

## 2016-06-26 MED ORDER — ALPRAZOLAM 0.5 MG PO TABS
0.5000 mg | ORAL_TABLET | Freq: Two times a day (BID) | ORAL | Status: DC | PRN
  Administered 2016-06-29: 0.5 mg via ORAL
  Filled 2016-06-26: qty 1

## 2016-06-26 MED ORDER — MORPHINE SULFATE (PF) 4 MG/ML IV SOLN
2.0000 mg | INTRAVENOUS | Status: DC | PRN
  Administered 2016-06-26 – 2016-06-27 (×4): 2 mg via INTRAVENOUS
  Filled 2016-06-26 (×4): qty 1

## 2016-06-26 MED ORDER — MORPHINE SULFATE (PF) 4 MG/ML IV SOLN
4.0000 mg | Freq: Once | INTRAVENOUS | Status: AC
  Administered 2016-06-26: 4 mg via INTRAVENOUS
  Filled 2016-06-26: qty 1

## 2016-06-26 MED ORDER — GABAPENTIN 300 MG PO CAPS
300.0000 mg | ORAL_CAPSULE | Freq: Every day | ORAL | Status: DC
  Administered 2016-06-26 – 2016-06-29 (×4): 300 mg via ORAL
  Filled 2016-06-26 (×4): qty 1

## 2016-06-26 NOTE — Consult Note (Signed)
[facility] Consult Note  Aris LotSally J [facility] Center[facility] 1955-09-19  [id number].    Requesting MD: Jeraldine LootsLockwood, MD Chief Complaint/Reason for Consult: Biopsy of left leg infection  HPI:  [name] is a 61 y.o. Female who is s/p excisional debridement and biopsy of left leg wound 06/09/16 by Dr. Ulice Boldillingham. She presents today with worsening pain/cellulitis of left leg and fever. Her symptoms started over 5 months ago after a pedicure that lead to infection of her toe with spread up her extremity. Despite outpatient antibiotic treatment and local wound care the infection persisted and caused painful necrosis of her left leg which led to the above procedure by Dr. Ulice Boldillingham. ID determined that the tissue samples obtained intra-operatively were not sufficient for diagnosis and additional bedside punch biopsies were performed. I am unable to find results from these biopsies. 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 She presents to the hospital again today as directed by her ID doctor, Dr. Daiva EvesVan Dam, for repeat debridement and larger tissue sample of necrotic left leg wound, along with AFB stain and culture. She has not received IV antibiotic therapy since her previous discharge, as she left AMA without a PICC line, due to what she describes as poor medical care.   ROS: Review of Systems  Constitutional: Positive for fever.  Skin:       Worsening left lower extremity pain and new LLE wound.  All other systems reviewed and are negative.   Family History  Problem Relation Age of Onset  . Hypertension Mother   . Hyperlipidemia Mother   . Hypertension Brother   . Asthma Brother     Past Medical History:  Diagnosis Date  . Allergy   . Anxiety   . Asthma   . COPD (chronic obstructive pulmonary disease) (HCC)   . Depression   . Hyperlipidemia   . Hypertension   . Lymphangitis 06/07/2016  . 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 FOOT SURGERY     Left  . HIP PINNING     3 each hip  . LYMPH NODE BIOPSY Left 06/09/2016   Procedure: BIOPSY LEFT LEG;  Surgeon: Peggye Formillingham, Claire S, DO;  Location: [facility];  Service: Plastics;  Laterality: Left;  . SLIPPED CAPITAL FEMORAL EPIPHYSIS PINNING      Social History:  reports that she has been smoking Cigarettes.  She has a 35.00 pack-year smoking history. She has never used smokeless tobacco. She reports that she does not drink alcohol or use drugs.  Allergies:  Allergies  Allergen Reactions  . Avelox [Moxifloxacin Hcl In Nacl] Anaphylaxis  . Terbinafine Hcl Swelling  . Zocor [Simvastatin - High Dose] Anaphylaxis  . Montelukast Sodium Other (See Comments)    Insomnia  . Pravastatin Other (See Comments)    Myalgias on 20mg     (Not in a hospital admission)  Blood pressure 133/81, pulse 80, temperature 97.5 F (36.4 C), temperature source Oral, resp. rate 18, height 5\' 9"  (1.753 m), weight 113.4 kg (250 lb), SpO2 95 %. Physical Exam: Physical Exam  Constitutional: She appears well-developed and well-nourished. No distress.  HENT:  Head: Normocephalic and atraumatic.  Right Ear: External ear normal.  Left Ear: External ear normal.  Eyes: Pupils are equal, round, and reactive to light. No scleral icterus.  Neck: Normal range of motion. No tracheal deviation present.  Cardiovascular: Normal rate, regular rhythm  and normal heart sounds.  Exam reveals no friction rub.   No murmur heard. Pulmonary/Chest: Effort normal and breath sounds normal. No stridor. No respiratory distress.  Abdominal: Soft. Bowel sounds are normal. She exhibits no distension. There is no tenderness. There is no guarding.  Musculoskeletal: Normal range of motion. She exhibits tenderness.  LLE with foot in cam boot. Left lower leg with roughly 5x7 cm bullae. Superiorly to bullae, medial to knee, is a large wound s/p  debridement with overlying necrosis. Significant surrounding cellulitis that is warm and tender to light touch.  Skin: She is not diaphoretic.    No results found for this or any previous visit (from the past 48 hour(s)). No results found.  Assessment/Plan Infectious wound of right lower extremity with associated cellulitis - S/P excisional debridement and biopsy of left leg wound 06/09/16 by Dr. Ulice Bold - followed by Dr. Daiva Eves with suspicion for nontuberculous mycobacterium - will discuss course of treatment with Dr. Daiva Eves to get a clearer picture of exactly what tissue sample wound be necessary to move forward with appropriate IV abx treatment.   According to previous notes by Dr. Daiva Eves, a larger tissue sample along with AFB stain/culture is required for definitive diagnosis. If a larger tissue sample is required, Dr. Phylliss Blakes will likely be able to perform debridement and biopsy tomorrow in OR. Outside of tissue diagnosis, I do not think that performing a larger excision of previous debridement would be beneficial for overall treatment, as it would result in a larger wound and more extensive wound care that will not improve patient outcome without appropriate antibiotic treatment. I do recommend initiating empiric antibiotic treatment according to Dr. Zenaida Niece Dam's recommendations.  Adam Phenix, [facility] 06/26/2016, 3:49 PM Pager: [phone] Consults: [phone] Mon-Fri 7:00 am-4:30 pm Sat-Sun 7:00 am-11:30 am

## 2016-06-26 NOTE — ED Notes (Signed)
Dinner tray has been given to patient.

## 2016-06-26 NOTE — ED Notes (Signed)
Pt is refusing labs until we hear from surgery.

## 2016-06-26 NOTE — ED Notes (Signed)
Report given to 4th floor. Pt ready for transport.

## 2016-06-26 NOTE — ED Notes (Signed)
Pt is alert and oriented x 4 and is verbally responsive. Pt has redness, swelling, and tenderness with touch to left leg. Pt has a wound to upper left leg  wound tissue appears necotic, and a second wound inferiorly laterally that appears like a raised red mass/lesion. Pt reports that pain medication has been effective at this time and denies pain.

## 2016-06-26 NOTE — ED Notes (Signed)
Dr. Van Dam remains at bedside.  

## 2016-06-26 NOTE — ED Provider Notes (Signed)
[facility]-EMERGENCY DEPT Provider Note   CSN: [id number] Arrival date & time: 06/26/16  1338     History   Chief Complaint Chief Complaint  Patient presents with  . Leg Swelling    HPI Rita Watkins is a 61 y.o. female.  HPI   Pt with hx left leg infection that began in February, with outpatient treatment with various antibiotics, admission 4/30-5/5 with excisional debridement and biopsy by Dr Ulice Bold (plastics) and followed by Dr Daiva Eves (ID) presents to ED today with worsening infection, recommended to come to ED for new biposy/AFB culture per notes.  Patient states she was told to come to ED for PICC line placement and that Dr Daiva Eves has a new plan for antibiotics for her.  States she was off antibiotics since discharge, leg has been worsening, fever starting today.   Past Medical History:  Diagnosis Date  . Allergy   . Anxiety   . Asthma   . COPD (chronic obstructive pulmonary disease) (HCC)   . Depression   . Hyperlipidemia   . Hypertension   . Lymphangitis 06/07/2016  . Mycobacterium infection, 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    Patient Active Problem List   Diagnosis Date Noted  . 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 Lymphangitis 06/07/2016  . Abscess   . Morbid obesity (HCC)   . Cellulitis 06/05/2016  . Left knee pain 03/15/2015  . Leg swelling 07/30/2014  . Chronic respiratory failure (HCC) 07/30/2014  . Leukocytosis 07/30/2014  . Otitis media 07/30/2014  . HTN (hypertension) 07/21/2014  . Pulmonary infiltrate in right lung on chest x-ray 07/21/2014  . Spinal stenosis of lumbar region 07/21/2014  . COPD pfts pending  07/15/2014  . Cigarette smoker 07/15/2014  . COPD exacerbation (HCC) 04/26/2014  . Acute respiratory failure with hypoxia (HCC) 04/26/2014  . Acute bronchiolitis due to other infectious organisms 04/26/2014  . Depression with anxiety 09/13/2012  . Panic attacks 06/17/2012  . Hyperlipidemia 09/23/2011  . Chronic pain 09/23/2011  . Vitamin D  deficiency 09/23/2011    Past Surgical History:  Procedure Laterality Date  . FOOT SURGERY     Left  . HIP PINNING     3 each hip  . LYMPH NODE BIOPSY Left 06/09/2016   Procedure: BIOPSY LEFT LEG;  Surgeon: Peggye Form, DO;  Location: [facility]S;  Service: Plastics;  Laterality: Left;  . SLIPPED CAPITAL FEMORAL EPIPHYSIS PINNING      OB History    No data available       Home Medications    Prior to Admission medications   Medication Sig Start Date End Date Taking? Authorizing Provider  acetaminophen (TYLENOL) 325 MG tablet Take 650 mg by mouth every 6 (six) hours as needed.   Yes [provider]  ALPRAZolam (XANAX) 0.5 MG tablet Take 1 tablet (0.5 mg total) by mouth 2 (two) times daily as needed for anxiety. 04/05/16  Yes Saguier, Ramon Dredge, PA-C  gabapentin (NEURONTIN) 300 MG capsule Take 1 capsule (300 mg total) by mouth at bedtime. 05/02/16  Yes Vivi Barrack, DPM  HYDROcodone-acetaminophen (NORCO/VICODIN) 5-325 MG tablet Take 1 tablet by mouth every 6 (six) hours as needed for moderate pain. 06/09/16  Yes Calvert Cantor, MD  ibuprofen (ADVIL,MOTRIN) 200 MG tablet Take 800 mg by mouth every 6 (six) hours as needed.   Yes [provider]  losartan-hydrochlorothiazide (HYZAAR) 100-12.5 MG tablet Take 1 tablet by mouth daily. 04/05/16  Yes Saguier, Ramon Dredge, PA-C  vitamin C (ASCORBIC ACID) 500 MG tablet  Take 500 mg by mouth daily.   Yes [provider]    Family History Family History  Problem Relation Age of Onset  . Hypertension Mother   . Hyperlipidemia Mother   . Hypertension Brother   . Asthma Brother     Social History Social History  Substance Use Topics  . Smoking status: Current Every Day Smoker    Packs/day: 1.00    Years: 35.00    Types: Cigarettes  . Smokeless tobacco: Never Used  . Alcohol use No     Allergies   Avelox [moxifloxacin hcl in nacl]; Terbinafine hcl; Zocor [simvastatin - high dose]; Montelukast sodium; and  Pravastatin   Review of Systems Review of Systems  All other systems reviewed and are negative.    Physical Exam Updated Vital Signs BP (!) 129/55 (BP Location: Right Arm)   Pulse 69   Temp 98 F (36.7 C)   Resp 20   Ht 5\' 9"  (1.753 m)   Wt 113.4 kg (250 lb)   SpO2 98%   BMI 36.92 kg/m   Physical Exam  Constitutional: She appears well-developed and well-nourished. No distress.  HENT:  Head: Normocephalic and atraumatic.  Neck: Normal range of motion. Neck supple.  Cardiovascular: Normal rate and regular rhythm.   Pulmonary/Chest: Effort normal and breath sounds normal. No respiratory distress. She has no wheezes.  Neurological: She is alert.  Skin: She is not diaphoretic.  Nursing note and vitals reviewed.        ED Treatments / Results  Labs (all labs ordered are listed, but only abnormal results are displayed) Labs Reviewed  BASIC METABOLIC PANEL - Abnormal; Notable for the following:       Result Value   Potassium 3.2 (*)    Chloride 99 (*)    Glucose, Bld 105 (*)    BUN 22 (*)    GFR calc non Af Amer 60 (*)    All other components within normal limits  CBC WITH DIFFERENTIAL/PLATELET - Abnormal; Notable for the following:    WBC 13.2 (*)    RBC 3.73 (*)    Hemoglobin 10.9 (*)    HCT 33.3 (*)    Platelets 430 (*)    Neutro Abs 8.5 (*)    Monocytes Absolute 1.6 (*)    All other components within normal limits    EKG  EKG Interpretation None       Radiology No results found.  Procedures Procedures (including critical care time)  Medications Ordered in ED Medications  morphine 4 MG/ML injection 4 mg (4 mg Intravenous Given 06/26/16 1755)  oxyCODONE-acetaminophen (PERCOCET/ROXICET) 5-325 MG per tablet 1 tablet (1 tablet Oral Given 06/26/16 1546)     Initial Impression / Assessment and Plan / ED Course  I have reviewed the triage vital signs and the nursing notes.  Pertinent labs & imaging results that were available during my care of  the patient were reviewed by me and considered in my medical decision making (see chart for details).  Clinical Course as of Jun 26 1920  Mon Jun 26, 2016  1451 I spoke with Dr Daiva EvesVan Dam and clarified his plan.  He would like the patient admitted to hospitalist service, to be seen in consult by Dr Ulice Boldillingham for repeat debridement and biopsy.  Biopsied tissues to be sent for culture. DO NOT start antibiotics empirically without Dr Zenaida NieceVan Dam's input.  Pt also with concern about not having insurance, will likely need inpatient social work consult.    [  EW]  1527 I spoke with Dr Ulice Bold who recommends that I call general surgery to take care of patient and request tissue cultures.    [EW]  1914 Discussed pt with Dr Toniann Fail who accepts admission  [EW]    Clinical Course User Index [EW] Trixie Dredge, PA-C    Pt with hx many months of left leg infection, worsening.  Per Dr Daiva Eves pt to come in for biopsies.  Pt seen by general surgery, plan for surgery tomorrow.  Pt also seen by Dr Daiva Eves.  To be admitted by [facility] hospitalists, Dr Toniann Fail accepting.    Final Clinical Impressions(s) / ED Diagnoses   Final diagnoses:  Mycobacterial infection  Cellulitis of left lower extremity  Skin bulla    New Prescriptions New Prescriptions   No medications on file     Trixie Dredge, Cordelia Poche 06/26/16 2252    Gerhard Munch, MD 06/30/16 [phone]

## 2016-06-26 NOTE — ED Notes (Signed)
Dr. Daiva EvesVan Dam remains at bedside.

## 2016-06-26 NOTE — ED Notes (Signed)
Bed: [id number] Expected date:  Expected time:  Means of arrival:  Comments: TRIAGE 2

## 2016-06-26 NOTE — ED Notes (Signed)
Patient has a boot to there left foot.

## 2016-06-26 NOTE — Telephone Encounter (Signed)
Thanks [name] I think yes and amputation might give Rita Watkins a lot of information especially if the toe has mycobacteria and at that week culture in the lab. She is a little bit difficult because she is so inpatient to get out of the hospital since possible I may give your ring tomorrow

## 2016-06-26 NOTE — Telephone Encounter (Signed)
Patient left message asking for ID advice, stating she had not heard from Dr Daiva EvesVan Dam since she left the hospital. Samule OhmKim Marley received request from Dr Daiva EvesVan Dam to work patient into clinic 5/9. This RN left message for patient on 5/8 asking her to call back immediately for appointment information. Patient calling today stating her leg is much worse, painful, more swollen, has more dead skin all over it.  Per Dr Daiva EvesVan Dam, [facility].   RN spoke with patient, she stated she did not understand, stating that she did not know anything about needing an IV or that one was even recommended. She states she doesn't know what to do because she is supposed to follow up with her podiatrist tomorrow for her toe. RN suggested that she follow Dr Zenaida NieceVan Dam's advice [facility].  RN advised that she let the ED know she is there at Dr Zenaida NieceVan Dam's request. She verbalized understanding, agreement. Andree CossHowell, Lashawnna Lambrecht M, RN

## 2016-06-26 NOTE — Consult Note (Signed)
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 Vanita Panda   HPI: Rita Watkins is an 61 y.o. female who had seen a few weeks ago at [facility]. She has a history of frequenting nail salons for pedicures including just before Christmas of 2017. A few days after Xmas, shortly before New Year's Eve she dropped a large water dispensing jug  of drinking water on her toe. She was wearing sandles at the time so the object directly impacted her bare feet. She did not notice any bleeding initially. HOwever she then developed blistering of the skin of her 4th toe which then scabbed. The toe became quite swollen and tender. She had been treating it with only topical antimicrobials before finally being seen by Dr. Jacqualyn Posey with Podiatry on February the 13th. Dr. Jacqualyn Posey observed ulceration of 4th toe. He obtained plain films which did not show osteo or fracture. She was given surgical shoe, loal wound care and keflex. Pt had experienced edema and was rx with lasix by PCP. Her wounds improved except for 4th digit which continued to be painful on followup visit in March with Podiatry. Pt given gabapentin for pain. Patient was then seen in late March and started on oral clindamycin. She began draining clear material from 4th digit though tenderness improved by exam in mid April. Local care and antibiotics, patient believes she was no on cephalexin again. Roughly 2 weeks prior to admission she developed area of redness on her thigh that progressed over ensuing 2 weeks with black center that expanded and area that was exquisitely tender. She was admitted to [facility] on the 30th and she was placed on vancomycin, zosyn then vancomycin, ceftriaxone and metronidazole. She had doppler's negative for DVT on admission CT-scan of tibia/fibula and foot showed: 5.1 x 2.4 cm fat  density mass in the anterior subcutaneous fat o the proximal left lower leg. I do not believe that the area in medial thigh was imaged here though.   I saw her on May the 2nd. I DC the broad spectrum abx she was on, narrowed to CTX. I asked for plastic surgery consult and the patient underwent extensive removal of tissue by Dr. Marla Roe in  the operating room. Unfortunately the tissue was essentially entirely sent to pathology. A few swabs were sent for culture.Dr. Marla Roe did perform two separate PUNCH biopsies which were sent for AFB smear and culture.  The pathologic findings were not helpful. In other words no granulomas were observed no mycobacteria fungi were observed. Had the material sent for in paraffin to pursue [place] in [place] for PCR was done for mycobacterial organisms fungal organisms and a broad PCR for bacterial pathogens. All these were negative. Note certainly this PCR technology can be compromised by fixatives such that that is those they're used to preserve the tissue for pathology.  The AFB or the smears weren't negative and cultures are negative at 2 weeks.  I tried to arrange to place her on an empiric regimen when she was still not hospital but she insisted on leaving that [place] would not wait for placement for PICC line and for attempts to arrange home health for home IV antibiotics and oral clarithromycin which we knew was going to be a challenge. I then offered an appointment the same week but she ended up not being scheduled in my clinic. According to  patient she actually plateaued in terms of her infectious soft tissue symptoms for the next week or so but then in the last week as had worsening pain at her biopsy site as well as in her fourth toe which is exquisitely tender to palpation. She is also had new areas that have began to develop bullae and along with some erythematous areas.  I feel very strongly that she would benefit from repeat biopsy of tissue that can be  sent but this time with extensive tissue sent for AFB smear and culture, fungal stain and culture.   I wanted also to image her leg with an MRI of CT to look for an abscess that might be present and which might also require debridement for therapeutic reasons and which might give [place] another target for culture and potentially pathological examination.    Past Medical History:  Diagnosis Date  . Allergy   . Anxiety   . Asthma   . COPD (chronic obstructive pulmonary disease) ([place])   . Depression   . Hyperlipidemia   . Hypertension   . Lymphangitis 06/07/2016  . 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 FOOT SURGERY     Left  . HIP PINNING     3 each hip  . LYMPH NODE BIOPSY Left 06/09/2016   Procedure: BIOPSY LEFT LEG;  Surgeon: Wallace Going, DO;  Location: [facility];  Service: Plastics;  Laterality: Left;  . SLIPPED CAPITAL FEMORAL EPIPHYSIS PINNING      Social History:  reports that she has been smoking Cigarettes.  She has a 35.00 pack-year smoking history. She has never used smokeless tobacco. She reports that she does not drink alcohol or use drugs.   Family History  Problem Relation Age of Onset  . Hypertension Mother   . Hyperlipidemia Mother   . Hypertension Brother   . Asthma Brother     Allergies  Allergen Reactions  . Avelox [Moxifloxacin Hcl In Nacl] Anaphylaxis  . Terbinafine Hcl Swelling  . Zocor [Simvastatin - High Dose] Anaphylaxis  . Montelukast Sodium Other (See Comments)    Insomnia  . Pravastatin Other (See Comments)    Myalgias on 31m     Medications: I have reviewed patients current medications as documented in Epic Anti-infectives    None         ROS:  as in HPI otherwise remainder of 12 point Review of Systems is negative   Blood pressure (!) 129/55, pulse 69, temperature 98 F (36.7 C), resp. rate 20, height 5' 9" (1.753 m), weight 250 lb (113.4 kg), SpO2 98 %. General:  Alert and awake, oriented x3, not in any acute distress. HEENT: anicteric sclera,  EOMI, oropharynx clear and without exudate Cardiovascular: egular rate, normal r,  no murmur rubs or gallops Pulmonary: no wheezing, no respiratory distress Gastrointestinal: soft  nondistended, normal bowel sounds, Musculoskeletal: no  clubbing or edema noted bilaterally  Skin,   Foot and toe 06/05/16:      Toe 06/07/16:     Leg 06/05/16:      Leg 06/07/16:       06/08/16:    Foot and toe 06/05/16:      Toe 06/07/16:       06/26/16:         Leg 06/05/16:      Leg 06/07/16:       06/08/16:      06/26/16:  Neuro: nonfocal   Results for orders placed or performed during the hospital encounter of 06/26/16 (from the past 48 hour(s))  Basic metabolic panel     Status: Abnormal   Collection Time: 06/26/16  5:53 PM  Result Value Ref Range   Sodium 137 135 - 145 mmol/L   Potassium 3.2 (L) 3.5 - 5.1 mmol/L   Chloride 99 (L) 101 - 111 mmol/L   CO2 27 22 - 32 mmol/L   Glucose, Bld 105 (H) 65 - 99 mg/dL   BUN 22 (H) 6 - 20 mg/dL   Creatinine, Ser 0.99 0.44 - 1.00 mg/dL   Calcium 9.1 8.9 - 10.3 mg/dL   GFR calc non Af Amer 60 (L) >60 mL/min   GFR calc Af Amer >60 >60 mL/min    Comment: (NOTE) The eGFR has been calculated using the CKD EPI equation. This calculation has not been validated in all clinical situations. eGFR's persistently <60 mL/min signify possible Chronic Kidney Disease.    Anion gap 11 5 - 15  CBC with Differential     Status: Abnormal   Collection Time: 06/26/16  5:53 PM  Result Value Ref Range   WBC 13.2 (H) 4.0 - 10.5 K/uL   RBC 3.73 (L) 3.87 - 5.11 MIL/uL   Hemoglobin 10.9 (L) 12.0 - 15.0 g/dL   HCT 33.3 (L) 36.0 - 46.0 %   MCV 89.3 78.0 - 100.0 fL   MCH 29.2 26.0 - 34.0 pg   MCHC 32.7 30.0 - 36.0 g/dL   RDW 13.3 11.5 - 15.5 %   Platelets 430 (H) 150 - 400 K/uL   Neutrophils  Relative % 64 %   Neutro Abs 8.5 (H) 1.7 - 7.7 K/uL   Lymphocytes Relative 23 %   Lymphs Abs 3.1 0.7 - 4.0 K/uL   Monocytes Relative 12 %   Monocytes Absolute 1.6 (H) 0.1 - 1.0 K/uL   Eosinophils Relative 1 %   Eosinophils Absolute 0.1 0.0 - 0.7 K/uL   Basophils Relative 0 %   Basophils Absolute 0.0 0.0 - 0.1 K/uL   @BRIEFLABTABLE(sdes,specrequest,cult,reptstatus)   ) Recent Results (from the past 720 hour(s))  Blood Culture (routine x 2)     Status: None   Collection Time: 06/05/16  9:35 AM  Result Value Ref Range Status   Specimen Description BLOOD LEFT ARM  Final   Special Requests   Final    BOTTLES DRAWN AEROBIC AND ANAEROBIC Blood Culture adequate volume   Culture   Final    NO GROWTH 5 DAYS Performed at [facility], [address] [place], [place] [address]    Report Status 06/10/2016 FINAL  Final  Blood Culture (routine x 2)     Status: None   Collection Time: 06/05/16 10:20 AM  Result Value Ref Range Status   Specimen Description BLOOD LEFT ARM  Final   Special Requests   Final    BOTTLES DRAWN AEROBIC AND ANAEROBIC Blood Culture adequate volume   Culture   Final    NO GROWTH 5 DAYS Performed at [facility], [address] [place], [place] [address]    Report Status 06/10/2016 FINAL  Final  Surgical pcr screen     Status: None   Collection Time: 06/09/16  1:07 AM  Result Value Ref Range Status   MRSA, PCR NEGATIVE NEGATIVE Final   Staphylococcus aureus NEGATIVE NEGATIVE Final    Comment:        The Xpert SA Assay (FDA   approved for NASAL specimens in patients over 74 years of age), is one component of a comprehensive surveillance program.  Test performance has been validated by [facility] for patients greater than or equal to 72 year old. It is not intended to diagnose infection nor to guide or monitor treatment.   Anaerobic culture     Status: None (Preliminary result)   Collection Time: 06/09/16  8:13 AM  Result Value Ref Range  Status   Specimen Description WOUND LEFT THIGH PROXIMAL  Final   Special Requests NONE  Final   Gram Stain   Final    FEW WBC PRESENT,BOTH PMN AND MONONUCLEAR NO ORGANISMS SEEN    Culture   Final    NO GROWTH 5 DAYS CONTINUING TO HOLD FOR 21 DAYS Performed at [facility], [place] [address], [place], [place] [address]    Report Status PENDING  Incomplete  Fungus Culture With Stain     Status: None (Preliminary result)   Collection Time: 06/09/16  8:13 AM  Result Value Ref Range Status   Fungus Stain Final report  Final    Comment: (NOTE) Performed At: [facility] [place], [place] [id number] Lindon Romp MD [id number]    Fungus (Mycology) Culture PENDING  Incomplete   Fungal Source WOUND  Final    Comment: LEFT THIGH PROXIMAL   Acid Fast Smear (AFB)     Status: None   Collection Time: 06/09/16  8:13 AM  Result Value Ref Range Status   AFB Specimen Processing Concentration  Final   Acid Fast Smear Negative  Final    Comment: (NOTE) Performed At: [facility] [place], [place] [id number] Lindon Romp MD QA:[id number]    Source (AFB) WOUND  Final    Comment: LEFT THIGH PROXIMAL   Aerobic Culture (superficial specimen)     Status: None (Preliminary result)   Collection Time: 06/09/16  8:13 AM  Result Value Ref Range Status   Specimen Description WOUND LEFT THIGH PROXIMAL  Final   Special Requests NONE  Final   Gram Stain NO WBC SEEN NO ORGANISMS SEEN   Final   Culture   Final    RARE STAPHYLOCOCCUS SPECIES (COAGULASE NEGATIVE) CONTINUING TO HOLD FOR 21 DAYS TO LOOK FOR NOCARDIA Performed at [facility], [place] [address], [place], [place] [address]    Report Status PENDING  Incomplete  Fungus Culture Result     Status: None   Collection Time: 06/09/16  8:13 AM  Result Value Ref Range Status   Result 1 Comment  Final    Comment: (NOTE) KOH/Calcofluor preparation:  no fungus observed. Performed At:  [facility] [place], [place] [id number] Lindon Romp MD [id number]   Acid Fast Smear (AFB)     Status: None   Collection Time: 06/09/16  8:17 AM  Result Value Ref Range Status   AFB Specimen Processing Concentration  Final   Acid Fast Smear Negative  Final    Comment: (NOTE) Performed At: [facility] [place], [place] [id number] Lindon Romp MD [id number]    Source (AFB) WOUND  Final    Comment: LEFT THIGH MEDIAL   Aerobic/Anaerobic Culture (surgical/deep wound)     Status: None (Preliminary result)   Collection Time: 06/09/16  8:17 AM  Result Value Ref Range Status   Specimen Description WOUND LEFT THIGH MEDIAL  Final   Special Requests Normal  Final   Gram  Stain   Final    FEW WBC PRESENT,BOTH PMN AND MONONUCLEAR NO ORGANISMS SEEN    Culture   Final    RARE STAPHYLOCOCCUS SPECIES (COAGULASE NEGATIVE) CONTINUING TO HOLD FOR 21 DAYS TO LOOK FOR NOCARDIA Performed at [facility], [address] [place], [place] [address]    Report Status PENDING  Incomplete   Organism ID, Bacteria STAPHYLOCOCCUS SPECIES (COAGULASE NEGATIVE)  Final      Susceptibility   Staphylococcus species (coagulase negative) - MIC*    CIPROFLOXACIN 4 RESISTANT Resistant     ERYTHROMYCIN >=8 RESISTANT Resistant     GENTAMICIN <=0.5 SENSITIVE Sensitive     OXACILLIN >=4 RESISTANT Resistant     TETRACYCLINE <=1 SENSITIVE Sensitive     VANCOMYCIN 1 SENSITIVE Sensitive     TRIMETH/SULFA <=10 SENSITIVE Sensitive     CLINDAMYCIN >=8 RESISTANT Resistant     RIFAMPIN <=0.5 SENSITIVE Sensitive     Inducible Clindamycin NEGATIVE Sensitive     * RARE STAPHYLOCOCCUS SPECIES (COAGULASE NEGATIVE)  Fungus Culture With Stain     Status: None (Preliminary result)   Collection Time: 06/09/16  8:17 AM  Result Value Ref Range Status   Fungus Stain Final report  Final    Comment: (NOTE) Performed At: [facility] [address]  [place], [place] [id number] [place] William F MD Ph:[id number]    Fungus (Mycology) Culture PENDING  Incomplete   Fungal Source WOUND  Final    Comment: LEFT THIGH MEDIAL Performed at [facility], [address] [place], [place] [address]   Fungus Culture Result     Status: None   Collection Time: 06/09/16  8:17 AM  Result Value Ref Range Status   Result 1 Comment  Final    Comment: (NOTE) KOH/Calcofluor preparation:  no fungus observed. Performed At: [facility] [place] [address] [place], [place] [id number] H[place] William F MD Ph:[id number]   Acid Fast Smear (AFB)     Status: None   Collection Time: 06/09/16  1:27 PM  Result Value Ref Range Status   AFB Specimen Processing Comment  Final    Comment: Tissue Grinding and Digestion/Decontamination   Acid Fast Smear Negative  Final    Comment: (NOTE) Performed At: [facility] [place] [address] [place], [place] [id number] H[place] William F MD Ph:[phone]    Source (AFB) LEFT  Final    Comment: LEG  Acid Fast Smear (AFB)     Status: None   Collection Time: 06/09/16  1:28 PM  Result Value Ref Range Status   AFB Specimen Processing Comment  Final    Comment: Tissue Grinding and Digestion/Decontamination   Acid Fast Smear Negative  Final    Comment: (NOTE) Performed At: [facility] [place] [address] [place], [place] [id number] H[place] William F MD Ph:[phone]    Source (AFB) LEFT  Final    Comment: LEG  Acid Fast Smear (AFB)     Status: None   Collection Time: 06/09/16  1:28 PM  Result Value Ref Range Status   AFB Specimen Processing Comment  Final    Comment: Tissue Grinding and Digestion/Decontamination   Acid Fast Smear Negative  Final    Comment: (NOTE) Performed At: [facility] [place] [address] , [place] [id number] Hancock William F MD Ph:[phone]    Source (AFB) LEFT  Final    Comment: LEG     Impression/Recommendation  Active Problems:   * No active hospital  problems. *   Rita Watkins   is a 60 y.o. 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  #1 Lymphangitis infection from toe to ankle to thigh:  I really think imaging could be helpful if we discovered an abscess deep in the tissue where we might be able to recover pus that can be sent for culture and where the intervention by surgery could be of therapeutic in addition to diagnostic help  NOTE for treatment purposes of NTM esp Mycobacteria abscessus which is predmominant in the [place] and in [place] in particular the ID and susceptibility of the organism being recovered is critical  AFTER WE HAVE EXTENSIVE TISSUE FOR AFB SMEAR AND CULTURE, AND FUNGAL SMEAR AND CULTURE   (Note to ensure this please do not send swabs to the micro-biology lab but send actual tissue, the mostly it can along with purulent material in sterile containers for the micro-lab can then process for cultures)  I would also consider sending for path but the last time we tried to do both we ended up with no culture  It may be helpful to send not just debrided necrotic tissue but also tissue that includes the necrotic material and adjacent healthy appearing tissue to help [place] discern the pathology  Fat necrosis seems unlikely, a vascultiis not likely either nor does pyoderma gangrenosum  Antibiotic regimen I would likely pick  Empirically would be   Continuous IV cefoxitin and IV tygacil + oral  Clarithromycin 515m bid  She will need charity care for the IV antibiotics and some means of obtaining clarithromycin which is not cheap.  She has no insurance I will put in case management consult.  I spent greater than 80  minutes with the patient including greater than 50% of time in face to face counsel of the patient and in coordination of her care with the emerge department  the primary team and would general surgery.   06/26/2016, 7:30 PM   Thank you so much for this interesting consult  [phone](pager) [phone](office) 06/26/2016, 7:30 PM  CRhina BrackettDam 06/26/2016, 7:30 PM

## 2016-06-26 NOTE — Telephone Encounter (Signed)
Pt. Is being admitted to [facility] by Dr. Daiva EvesVan Dam on 06/26/2016.

## 2016-06-26 NOTE — ED Triage Notes (Addendum)
Pt verbalizes told to return for antibiotic related to biopsy results of left leg infection. Pt continues to verbalizes treated self for fever of 101.5 with aspirin at 1015 today.

## 2016-06-26 NOTE — ED Notes (Signed)
Pt states general surgery is going to come back into room to inform exactly when surgery will take place. Pt would still like to hold on all orders till she is exactly sure she will be staying.

## 2016-06-26 NOTE — H&P (Signed)
History and Physical    Rita FieldingSally J Watkins ZOX:[id number] DOB: [birth year] DOA: 06/26/2016  PCP: Esperanza RichtersSaguier, Edward, PA-C  Patient coming from: Home.  Chief Complaint: Left leg cellulitis.  HPI: Rita FieldingSally J Watkins is a 61 y.o. female with history of hypertension who was recently admitted to the hospital for left leg cellulitis presents to the ER because of new lesions on the left leg. Patient is admitted during early part of this month for necrotic looking lesion on the left inner aspect of the thigh. Patient's wound initially started in February around the left fourth toe after patient had dropped weight on it. Following which patient still had blistered and started developing a wound for which patient has been following up with Dr. Loreta AveWagner podiatrist. During the second half of April last month patient started developing lesions on the inner aspect of the thigh and had come to the ER last and was admitted and started on antibiotics. Patient had undergone wound debridement and biopsy sent but only to pathology. Patient started noticing new wound on the inferior aspect of the previous ulceration. Ulcerations are painful but no active discharge seen. Appears necrotic. During previous admission patient had declined antibiotics at discharge.  ED Course: In the ER infectious disease and general surgery has been consulted. At this time plan is to get repeat biopsy but for now hold off antibiotics.  Review of Systems: As per HPI, rest all negative.   Past Medical History:  Diagnosis Date  . Allergy   . Anxiety   . Asthma   . COPD (chronic obstructive pulmonary disease) (HCC)   . Depression   . Hyperlipidemia   . Hypertension   . Lymphangitis 06/07/2016  . 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 FOOT SURGERY     Left  . HIP PINNING     3 each hip  . LYMPH NODE BIOPSY Left 06/09/2016   Procedure: BIOPSY LEFT LEG;  Surgeon: Peggye Formillingham, Claire S, DO;   Location: [facility];  Service: Plastics;  Laterality: Left;  . SLIPPED CAPITAL FEMORAL EPIPHYSIS PINNING       reports that she has been smoking Cigarettes.  She has a 35.00 pack-year smoking history. She has never used smokeless tobacco. She reports that she does not drink alcohol or use drugs.  Allergies  Allergen Reactions  . Avelox [Moxifloxacin Hcl In Nacl] Anaphylaxis  . Terbinafine Hcl Swelling  . Zocor [Simvastatin - High Dose] Anaphylaxis  . Montelukast Sodium Other (See Comments)    Insomnia  . Pravastatin Other (See Comments)    Myalgias on 20mg     Family History  Problem Relation Age of Onset  . Hypertension Mother   . Hyperlipidemia Mother   . Hypertension Brother   . Asthma Brother     Prior to Admission medications   Medication Sig Start Date End Date Taking? Authorizing Provider  acetaminophen (TYLENOL) 325 MG tablet Take 650 mg by mouth every 6 (six) hours as needed.   Yes [provider]  ALPRAZolam (XANAX) 0.5 MG tablet Take 1 tablet (0.5 mg total) by mouth 2 (two) times daily as needed for anxiety. 04/05/16  Yes Saguier, Ramon DredgeEdward, PA-C  gabapentin (NEURONTIN) 300 MG capsule Take 1 capsule (300 mg total) by mouth at bedtime. 05/02/16  Yes Vivi BarrackWagoner, Matthew R, DPM  HYDROcodone-acetaminophen (NORCO/VICODIN) 5-325 MG tablet Take 1 tablet by mouth every 6 (six) hours as needed for moderate pain. 06/09/16  Yes Calvert Cantorizwan, Saima, MD  ibuprofen (ADVIL,MOTRIN) 200 MG tablet Take 800 mg by mouth every 6 (six) hours as needed.   Yes [provider]  losartan-hydrochlorothiazide (HYZAAR) 100-12.5 MG tablet Take 1 tablet by mouth daily. 04/05/16  Yes Saguier, Ramon Dredge, PA-C  vitamin C (ASCORBIC ACID) 500 MG tablet Take 500 mg by mouth daily.   Yes [provider]    Physical Exam: Vitals:   06/26/16 1627 06/26/16 1835 06/26/16 1959 06/26/16 2051  BP: (!) 140/56 (!) 129/55  (!) 153/49  Pulse: 69 69  77  Resp: 20 20  18   Temp: 98 F (36.7 C)   98.8 F (37.1  C)  TempSrc:    Oral  SpO2: 96% 98% 98% 99%  Weight:    113 kg (249 lb 1.6 oz)  Height:    5\' 9"  (1.753 m)      Constitutional: Moderately built and nourished. Vitals:   06/26/16 1627 06/26/16 1835 06/26/16 1959 06/26/16 2051  BP: (!) 140/56 (!) 129/55  (!) 153/49  Pulse: 69 69  77  Resp: 20 20  18   Temp: 98 F (36.7 C)   98.8 F (37.1 C)  TempSrc:    Oral  SpO2: 96% 98% 98% 99%  Weight:    113 kg (249 lb 1.6 oz)  Height:    5\' 9"  (1.753 m)   Eyes: Anicteric no pallor. ENMT: No discharge from the ears eyes nose or mouth. Neck: No mass felt. No neck rigidity. Respiratory: No rhonchi or crepitations. Cardiovascular: S1-S2 heard no murmurs appreciated. Abdomen: Soft nontender bowel sounds present. Musculoskeletal: No edema. No joint effusion. Skin: Skin is 2 lesions one on the inferior aspect of the thigh in the medial aspect measuring around 10-5 cm and another one measuring almost similar on the anterior shin. The left fourth toe looks necrotic. Neurologic: Alert awake oriented to time place and person. Moves all extremities. Psychiatric: Appears normal. Normal affect.   Labs on Admission: I have personally reviewed following labs and imaging studies  CBC:  Recent Labs Lab 06/26/16 1753  WBC 13.2*  NEUTROABS 8.5*  HGB 10.9*  HCT 33.3*  MCV 89.3  PLT 430*   Basic Metabolic Panel:  Recent Labs Lab 06/26/16 1753  NA 137  K 3.2*  CL 99*  CO2 27  GLUCOSE 105*  BUN 22*  CREATININE 0.99  CALCIUM 9.1   GFR: Estimated Creatinine Clearance: 79 mL/min (by C-G formula based on SCr of 0.99 mg/dL). Liver Function Tests: No results for input(s): AST, ALT, ALKPHOS, BILITOT, PROT, ALBUMIN in the last 168 hours. No results for input(s): LIPASE, AMYLASE in the last 168 hours. No results for input(s): AMMONIA in the last 168 hours. Coagulation Profile: No results for input(s): INR, PROTIME in the last 168 hours. Cardiac Enzymes: No results for input(s): CKTOTAL,  CKMB, CKMBINDEX, TROPONINI in the last 168 hours. BNP (last 3 results) No results for input(s): PROBNP in the last 8760 hours. HbA1C: No results for input(s): HGBA1C in the last 72 hours. CBG: No results for input(s): GLUCAP in the last 168 hours. Lipid Profile: No results for input(s): CHOL, HDL, LDLCALC, TRIG, CHOLHDL, LDLDIRECT in the last 72 hours. Thyroid Function Tests: No results for input(s): TSH, T4TOTAL, FREET4, T3FREE, THYROIDAB in the last 72 hours. Anemia Panel: No results for input(s): VITAMINB12, FOLATE, FERRITIN, TIBC, IRON, RETICCTPCT in the last 72 hours. Urine analysis:    Component Value Date/Time   COLORURINE YELLOW 06/08/2016 1244   APPEARANCEUR CLEAR 06/08/2016 1244   LABSPEC 1.020 06/08/2016  1244   PHURINE 5.0 06/08/2016 1244   GLUCOSEU NEGATIVE 06/08/2016 1244   HGBUR MODERATE (A) 06/08/2016 1244   BILIRUBINUR NEGATIVE 06/08/2016 1244   KETONESUR NEGATIVE 06/08/2016 1244   PROTEINUR NEGATIVE 06/08/2016 1244   NITRITE NEGATIVE 06/08/2016 1244   LEUKOCYTESUR SMALL (A) 06/08/2016 1244   Sepsis Labs: @LABRCNTIP (procalcitonin:4,lacticidven:4) )No results found for this or any previous visit (from the past 240 hour(s)).   Radiological Exams on Admission: No results found.   Assessment/Plan Principal Problem:   Cellulitis of left lower extremity Active Problems:   HTN (hypertension)    1. Cellulitis with necrosis concerning for Mycobacterium infection - discussed with Dr. Algis Liming infectious disease consult. At this time plan is to get repeat biopsy for which general surgery has been consulted. Patient will be kept nothing by mouth past midnight. Antibiotics will be considered to the biopsy done. 2. Hypertension on ARB and hydrochlorothiazide which will be continued.   DVT prophylaxis: SCDs. Code Status: Full code.  Family Communication: Discussed with patient.  Disposition Plan: Home.  Consults called: Infectious disease and general surgery.    Admission status: Inpatient.    Eduard Clos MD [facility] Pager [phone].  If 7PM-7AM, please contact night-coverage [email or web address] Password [facility]  06/26/2016, 9:16 PM

## 2016-06-26 NOTE — Telephone Encounter (Signed)
I was looking at her chart and it really looks worse then when I last saw here. The areas on her leg are new since I saw her. The toe I am still concerned will need a partial amputation at least. [facility] [place] [facility] [place] [facility]. If she does not need/refuse the amputation I can biopsy the toe in the OR this week. In regards to the messages about further culture/biopsy, is this more from the leg that Dr. Ulice Boldillingham biopsied previously?  If you need anything else please feel free to contact me at [phone] or at the office [phone] and ask for me. Thanks.

## 2016-06-26 NOTE — ED Notes (Signed)
Dr. Daiva EvesVan Dam is at bedside.

## 2016-06-26 NOTE — ED Notes (Signed)
General surgery at bedside. 

## 2016-06-26 NOTE — ED Notes (Signed)
Patient allowed to order a heart healthy food tray. Patient tried to call in order and service response and was told No. AD was notified and help to obtain meal for patient.

## 2016-06-26 NOTE — Telephone Encounter (Signed)
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 I think she would benefit from repeat and more extensive biopsies this time sent not for pathology but for AFB culture. The AP cultures from the punch biopsies are negative on smear and the PCR from the pathology specimens were negative though they can also miss this organism when formalin is involved

## 2016-06-27 ENCOUNTER — Inpatient Hospital Stay (HOSPITAL_COMMUNITY): Payer: Self-pay | Admitting: Anesthesiology

## 2016-06-27 ENCOUNTER — Ambulatory Visit: Payer: Self-pay | Admitting: Podiatry

## 2016-06-27 ENCOUNTER — Encounter (HOSPITAL_COMMUNITY): Payer: Self-pay | Admitting: Certified Registered"

## 2016-06-27 ENCOUNTER — Encounter (HOSPITAL_COMMUNITY)
Admission: EM | Disposition: A | Discharge status: Home / Self Care | Payer: Self-pay | Source: Home / Self Care | Attending: Internal Medicine

## 2016-06-27 HISTORY — PX: IRRIGATION AND DEBRIDEMENT ABSCESS: SHX5252

## 2016-06-27 LAB — MAGNESIUM: MAGNESIUM: 1.8 mg/dL (ref 1.7–2.4)

## 2016-06-27 LAB — CBC
HEMATOCRIT: 31.8 % — AB (ref 36.0–46.0)
HEMOGLOBIN: 10.3 g/dL — AB (ref 12.0–15.0)
MCH: 28.9 pg (ref 26.0–34.0)
MCHC: 32.4 g/dL (ref 30.0–36.0)
MCV: 89.1 fL (ref 78.0–100.0)
Platelets: 363 10*3/uL (ref 150–400)
RBC: 3.57 MIL/uL — AB (ref 3.87–5.11)
RDW: 12.9 % (ref 11.5–15.5)
WBC: 9.8 10*3/uL (ref 4.0–10.5)

## 2016-06-27 LAB — BASIC METABOLIC PANEL
ANION GAP: 10 (ref 5–15)
BUN: 16 mg/dL (ref 6–20)
CALCIUM: 8.9 mg/dL (ref 8.9–10.3)
CO2: 28 mmol/L (ref 22–32)
Chloride: 100 mmol/L — ABNORMAL LOW (ref 101–111)
Creatinine, Ser: 0.76 mg/dL (ref 0.44–1.00)
GLUCOSE: 100 mg/dL — AB (ref 65–99)
POTASSIUM: 3.2 mmol/L — AB (ref 3.5–5.1)
Sodium: 138 mmol/L (ref 135–145)

## 2016-06-27 LAB — SURGICAL PCR SCREEN
MRSA, PCR: NEGATIVE
Staphylococcus aureus: NEGATIVE

## 2016-06-27 LAB — C-REACTIVE PROTEIN: CRP: 18.7 mg/dL — ABNORMAL HIGH (ref <1.0)

## 2016-06-27 LAB — SEDIMENTATION RATE: SED RATE: 120 mm/h — AB (ref 0–22)

## 2016-06-27 SURGERY — IRRIGATION AND DEBRIDEMENT ABSCESS
Anesthesia: General | Site: Leg Upper

## 2016-06-27 MED ORDER — PROPOFOL 10 MG/ML IV BOLUS
INTRAVENOUS | Status: AC
  Filled 2016-06-27: qty 20

## 2016-06-27 MED ORDER — OXYCODONE HCL 5 MG PO TABS
5.0000 mg | ORAL_TABLET | ORAL | Status: DC | PRN
  Administered 2016-06-27 – 2016-06-30 (×7): 5 mg via ORAL
  Filled 2016-06-27 (×8): qty 1

## 2016-06-27 MED ORDER — FENTANYL CITRATE (PF) 100 MCG/2ML IJ SOLN
INTRAMUSCULAR | Status: DC | PRN
  Administered 2016-06-27 (×2): 50 ug via INTRAVENOUS

## 2016-06-27 MED ORDER — LACTATED RINGERS IV SOLN
INTRAVENOUS | Status: DC | PRN
  Administered 2016-06-27 (×2): via INTRAVENOUS

## 2016-06-27 MED ORDER — MORPHINE SULFATE (PF) 4 MG/ML IV SOLN
2.0000 mg | INTRAVENOUS | Status: DC | PRN
  Administered 2016-06-27 – 2016-06-28 (×5): 2 mg via INTRAVENOUS
  Filled 2016-06-27 (×5): qty 1

## 2016-06-27 MED ORDER — POTASSIUM CHLORIDE CRYS ER 20 MEQ PO TBCR
40.0000 meq | EXTENDED_RELEASE_TABLET | Freq: Once | ORAL | Status: AC
  Administered 2016-06-27: 40 meq via ORAL
  Filled 2016-06-27 (×2): qty 2

## 2016-06-27 MED ORDER — FENTANYL CITRATE (PF) 100 MCG/2ML IJ SOLN
25.0000 ug | INTRAMUSCULAR | Status: DC | PRN
  Administered 2016-06-27 (×2): 50 ug via INTRAVENOUS

## 2016-06-27 MED ORDER — FENTANYL CITRATE (PF) 100 MCG/2ML IJ SOLN
INTRAMUSCULAR | Status: AC
  Administered 2016-06-27: 50 ug via INTRAVENOUS
  Filled 2016-06-27: qty 4

## 2016-06-27 MED ORDER — HYDROMORPHONE HCL 2 MG/ML IJ SOLN
INTRAMUSCULAR | Status: AC
  Filled 2016-06-27: qty 1

## 2016-06-27 MED ORDER — LIDOCAINE 2% (20 MG/ML) 5 ML SYRINGE
INTRAMUSCULAR | Status: AC
  Filled 2016-06-27: qty 5

## 2016-06-27 MED ORDER — PROPOFOL 10 MG/ML IV BOLUS
INTRAVENOUS | Status: DC | PRN
  Administered 2016-06-27: 200 mg via INTRAVENOUS

## 2016-06-27 MED ORDER — PROMETHAZINE HCL 25 MG/ML IJ SOLN: 6.2500 mg | INTRAMUSCULAR | Status: DC | PRN

## 2016-06-27 MED ORDER — SODIUM CHLORIDE 0.9 % IV SOLN
INTRAVENOUS | Status: DC
  Administered 2016-06-27: 19:00:00 via INTRAVENOUS
  Filled 2016-06-27 (×3): qty 250

## 2016-06-27 MED ORDER — CLARITHROMYCIN 250 MG PO TABS
500.0000 mg | ORAL_TABLET | Freq: Two times a day (BID) | ORAL | Status: DC
  Administered 2016-06-27 – 2016-06-30 (×6): 500 mg via ORAL
  Filled 2016-06-27 (×2): qty 2
  Filled 2016-06-27: qty 1
  Filled 2016-06-27: qty 2
  Filled 2016-06-27: qty 1
  Filled 2016-06-27 (×4): qty 2
  Filled 2016-06-27: qty 1

## 2016-06-27 MED ORDER — LIDOCAINE 2% (20 MG/ML) 5 ML SYRINGE
INTRAMUSCULAR | Status: DC | PRN
  Administered 2016-06-27: 50 mg via INTRAVENOUS

## 2016-06-27 MED ORDER — BUPIVACAINE HCL (PF) 0.25 % IJ SOLN
INTRAMUSCULAR | Status: AC
  Filled 2016-06-27: qty 30

## 2016-06-27 MED ORDER — FENTANYL CITRATE (PF) 100 MCG/2ML IJ SOLN
INTRAMUSCULAR | Status: AC
Start: 2016-06-27 — End: 2016-06-27
  Filled 2016-06-27: qty 2

## 2016-06-27 MED ORDER — HYDROMORPHONE HCL 1 MG/ML IJ SOLN
INTRAMUSCULAR | Status: DC | PRN
  Administered 2016-06-27 (×2): 0.5 mg via INTRAVENOUS
  Administered 2016-06-27: 1 mg via INTRAVENOUS

## 2016-06-27 MED ORDER — KETOROLAC TROMETHAMINE 30 MG/ML IJ SOLN
30.0000 mg | Freq: Once | INTRAMUSCULAR | Status: DC | PRN
  Administered 2016-06-27: 30 mg via INTRAVENOUS

## 2016-06-27 MED ORDER — MIDAZOLAM HCL 5 MG/5ML IJ SOLN
INTRAMUSCULAR | Status: DC | PRN
  Administered 2016-06-27: 2 mg via INTRAVENOUS

## 2016-06-27 MED ORDER — KETOROLAC TROMETHAMINE 30 MG/ML IJ SOLN
INTRAMUSCULAR | Status: AC
  Filled 2016-06-27: qty 1

## 2016-06-27 MED ORDER — ONDANSETRON HCL 4 MG/2ML IJ SOLN
INTRAMUSCULAR | Status: AC
  Filled 2016-06-27: qty 2

## 2016-06-27 MED ORDER — MIDAZOLAM HCL 2 MG/2ML IJ SOLN
INTRAMUSCULAR | Status: AC
  Filled 2016-06-27: qty 2

## 2016-06-27 SURGICAL SUPPLY — 31 items
BANDAGE ELASTIC 6 VELCRO ST LF (GAUZE/BANDAGES/DRESSINGS) ×3 IMPLANT
BLADE SURG 15 STRL LF DISP TIS (BLADE) ×1 IMPLANT
BLADE SURG 15 STRL SS (BLADE) ×2
BNDG GAUZE ELAST 4 BULKY (GAUZE/BANDAGES/DRESSINGS) ×3 IMPLANT
COVER SURGICAL LIGHT HANDLE (MISCELLANEOUS) ×3 IMPLANT
DECANTER SPIKE VIAL GLASS SM (MISCELLANEOUS) IMPLANT
DERMABOND ADVANCED (GAUZE/BANDAGES/DRESSINGS)
DERMABOND ADVANCED .7 DNX12 (GAUZE/BANDAGES/DRESSINGS) IMPLANT
DRAPE LAPAROTOMY T 102X78X121 (DRAPES) IMPLANT
DRAPE LAPAROTOMY TRNSV 102X78 (DRAPE) IMPLANT
ELECT PENCIL ROCKER SW 15FT (MISCELLANEOUS) ×3 IMPLANT
ELECT REM PT RETURN 15FT ADLT (MISCELLANEOUS) ×3 IMPLANT
GAUZE SPONGE 4X4 12PLY STRL (GAUZE/BANDAGES/DRESSINGS) ×3 IMPLANT
GLOVE BIO SURGEON STRL SZ 6 (GLOVE) ×3 IMPLANT
GLOVE INDICATOR 6.5 STRL GRN (GLOVE) ×3 IMPLANT
GOWN STRL REUS W/TWL LRG LVL3 (GOWN DISPOSABLE) ×3 IMPLANT
GOWN STRL REUS W/TWL XL LVL3 (GOWN DISPOSABLE) ×3 IMPLANT
KIT BASIN OR (CUSTOM PROCEDURE TRAY) ×3 IMPLANT
MARKER SKIN DUAL TIP RULER LAB (MISCELLANEOUS) IMPLANT
NEEDLE HYPO 22GX1.5 SAFETY (NEEDLE) ×3 IMPLANT
PACK BASIC VI WITH GOWN DISP (CUSTOM PROCEDURE TRAY) ×3 IMPLANT
PAD ABD 8X10 STRL (GAUZE/BANDAGES/DRESSINGS) ×3 IMPLANT
SPONGE LAP 18X18 X RAY DECT (DISPOSABLE) IMPLANT
STAPLER VISISTAT 35W (STAPLE) IMPLANT
SUT MNCRL AB 4-0 PS2 18 (SUTURE) ×3 IMPLANT
SUT VIC AB 3-0 SH 27 (SUTURE) ×2
SUT VIC AB 3-0 SH 27XBRD (SUTURE) ×1 IMPLANT
SYR CONTROL 10ML LL (SYRINGE) ×3 IMPLANT
TOWEL OR 17X26 10 PK STRL BLUE (TOWEL DISPOSABLE) ×3 IMPLANT
TOWEL OR NON WOVEN STRL DISP B (DISPOSABLE) ×3 IMPLANT
YANKAUER SUCT BULB TIP 10FT TU (MISCELLANEOUS) IMPLANT

## 2016-06-27 NOTE — Progress Notes (Signed)
PROGRESS NOTE    Rita Watkins  ZOX:[id number] DOB: May 08, 1955 DOA: 06/26/2016 PCP: Esperanza RichtersSaguier, Edward, PA-C     Brief Narrative:  61 y/o woman admitted from home on 5/21 due to new necrotic-like lesions on her left leg. She has been dealing with cellulitis/necrosis of her left toe since 12/17 after a traumatic injury. Was hospitalized in April for thigh lesions. Seen by ID who believes this may be lymphangitic spread of a mycobacterium infection that originated in the toe. Surgery has been consulted for tissue biopsies and cultures.   Assessment & Plan:   Principal Problem:   Cellulitis of left lower extremity Active Problems:   HTN (hypertension)   Left Leg Cellulitis -With concern for mycobacterial infection. -Discussed via phone with Dr. Daiva EvesVan Dam. Patient will be going to the OR today for cultures. Plan to withhold abx for now until after tissue is obtained. -Will need a long course of biaxin, which is costly and patient does not have insurance. Will request CM assistance.  HTN -Fair control, continue home medications.  Morbid Obesity -Counseled on weight reduction and exercise.  Hypokalemia -Replace orally. -Check Mg level.   DVT prophylaxis: SCDs Code Status: full code Family Communication: patient only Disposition Plan: OR later today  Consultants:   ID  General Surgery  Procedures:   none  Antimicrobials:  Anti-infectives    None       Subjective: Feels well, really would like to have toe amputated  Objective: Vitals:   06/26/16 1835 06/26/16 1959 06/26/16 2051 06/27/16 0434  BP: (!) 129/55  (!) 153/49 (!) 143/61  Pulse: 69  77 86  Resp: 20  18 18   Temp:   98.8 F (37.1 C) 98.9 F (37.2 C)  TempSrc:   Oral Oral  SpO2: 98% 98% 99% 98%  Weight:   113 kg (249 lb 1.6 oz)   Height:   5\' 9"  (1.753 m)     Intake/Output Summary (Last 24 hours) at 06/27/16 1031 Last data filed at 06/27/16 0758  Gross per 24 hour  Intake              120 ml    Output             1650 ml  Net            -1530 ml   Filed Weights   06/26/16 1353 06/26/16 2051  Weight: 113.4 kg (250 lb) 113 kg (249 lb 1.6 oz)    Examination:  General exam: Alert, awake, oriented x 3 Respiratory system: Clear to auscultation. Respiratory effort normal. Cardiovascular system:RRR. No murmurs, rubs, gallops. Gastrointestinal system: Abdomen is nondistended, soft and nontender. No organomegaly or masses felt. Normal bowel sounds heard. Central nervous system: Alert and oriented. No focal neurological deficits. Extremities:  See pics          Psychiatry: Judgement and insight appear normal. Mood & affect appropriate.     Data Reviewed: I have personally reviewed following labs and imaging studies  CBC:  Recent Labs Lab 06/26/16 1753 06/27/16 0359  WBC 13.2* 9.8  NEUTROABS 8.5*  --   HGB 10.9* 10.3*  HCT 33.3* 31.8*  MCV 89.3 89.1  PLT 430* 363   Basic Metabolic Panel:  Recent Labs Lab 06/26/16 1753 06/27/16 0359  NA 137 138  K 3.2* 3.2*  CL 99* 100*  CO2 27 28  GLUCOSE 105* 100*  BUN 22* 16  CREATININE 0.99 0.76  CALCIUM 9.1 8.9   GFR: Estimated Creatinine  Clearance: 97.7 mL/min (by C-G formula based on SCr of 0.76 mg/dL). Liver Function Tests: No results for input(s): AST, ALT, ALKPHOS, BILITOT, PROT, ALBUMIN in the last 168 hours. No results for input(s): LIPASE, AMYLASE in the last 168 hours. No results for input(s): AMMONIA in the last 168 hours. Coagulation Profile: No results for input(s): INR, PROTIME in the last 168 hours. Cardiac Enzymes: No results for input(s): CKTOTAL, CKMB, CKMBINDEX, TROPONINI in the last 168 hours. BNP (last 3 results) No results for input(s): PROBNP in the last 8760 hours. HbA1C: No results for input(s): HGBA1C in the last 72 hours. CBG: No results for input(s): GLUCAP in the last 168 hours. Lipid Profile: No results for input(s): CHOL, HDL, LDLCALC, TRIG, CHOLHDL, LDLDIRECT in the last 72  hours. Thyroid Function Tests: No results for input(s): TSH, T4TOTAL, FREET4, T3FREE, THYROIDAB in the last 72 hours. Anemia Panel: No results for input(s): VITAMINB12, FOLATE, FERRITIN, TIBC, IRON, RETICCTPCT in the last 72 hours. Urine analysis:    Component Value Date/Time   COLORURINE YELLOW 06/08/2016 1244   APPEARANCEUR CLEAR 06/08/2016 1244   LABSPEC 1.020 06/08/2016 1244   PHURINE 5.0 06/08/2016 1244   GLUCOSEU NEGATIVE 06/08/2016 1244   HGBUR MODERATE (A) 06/08/2016 1244   BILIRUBINUR NEGATIVE 06/08/2016 1244   KETONESUR NEGATIVE 06/08/2016 1244   PROTEINUR NEGATIVE 06/08/2016 1244   NITRITE NEGATIVE 06/08/2016 1244   LEUKOCYTESUR SMALL (A) 06/08/2016 1244   Sepsis Labs: @LABRCNTIP (procalcitonin:4,lacticidven:4)  )No results found for this or any previous visit (from the past 240 hour(s)).       Radiology Studies: No results found.      Scheduled Meds: . gabapentin  300 mg Oral QHS  . losartan  100 mg Oral Daily   And  . hydrochlorothiazide  12.5 mg Oral Daily   Continuous Infusions:   LOS: 1 day    Time spent: 25 minutes. Greater than 50% of this time was spent in direct contact with the patient coordinating care.     Chaya Jan, MD [facility] Pager [phone]  If 7PM-7AM, please contact night-coverage [email or web address] Password [facility] 06/27/2016, 10:31 AM

## 2016-06-27 NOTE — Anesthesia Preprocedure Evaluation (Addendum)
Anesthesia Evaluation  Patient identified by MRN, date of birth, ID band Patient awake    Reviewed: Allergy & Precautions, NPO status , Patient's Chart, lab work & pertinent test results  Airway Mallampati: II  TM Distance: >3 FB Neck ROM: Full    Dental  (+) Partial Lower, Lower Dentures, Dental Advisory Given   Pulmonary COPD, Current Smoker,    Pulmonary exam normal breath sounds clear to auscultation       Cardiovascular hypertension, Normal cardiovascular exam Rhythm:Regular Rate:Normal     Neuro/Psych negative neurological ROS  negative psych ROS   GI/Hepatic negative GI ROS, Neg liver ROS,   Endo/Other  negative endocrine ROS  Renal/GU negative Renal ROS  negative genitourinary   Musculoskeletal negative musculoskeletal ROS (+)   Abdominal   Peds negative pediatric ROS (+)  Hematology  (+) anemia ,   Anesthesia Other Findings   Reproductive/Obstetrics negative OB ROS                            Anesthesia Physical Anesthesia Plan  ASA: III  Anesthesia Plan: General   Post-op Pain Management:    Induction: Intravenous  Airway Management Planned: LMA  Additional Equipment:   Intra-op Plan:   Post-operative Plan: Extubation in OR  Informed Consent: I have reviewed the patients History and Physical, chart, labs and discussed the procedure including the risks, benefits and alternatives for the proposed anesthesia with the patient or authorized representative who has indicated his/her understanding and acceptance.   Dental advisory given  Plan Discussed with: CRNA and Surgeon  Anesthesia Plan Comments:         Anesthesia Quick Evaluation

## 2016-06-27 NOTE — Op Note (Signed)
Operative Note  Rita Watkins  [id number]  [id number]  06/27/2016   Surgeon: Berna Buehelsea [name]  Assistant: OR staff  Procedure performed: Excisional debridement of left medial thigh eschar (wound 11 x 6 x 1.5cm); Biopsy of left lower leg skin and soft tissue (wound 2 x 2 x 2cm), biopsy of left posterior lower leg skin and soft tissue (wound 2 x 2 x 2cm)   Preop diagnosis: Chronic soft tissue infection  Post-op diagnosis/intraop findings: Wounds as measured above. The soft tissue in the left lower leg was fat necrosis saturated with murky/purulent fluid. The left thigh wound base is woody necrotic fat  Specimens: All THREE specimens (Left thigh soft tissue, left lower leg skin/soft tissue, left posterior lower leg skin/soft tissue) are sent for acid-fast bacilli culture/sensitivities and fungal stain/culture.   Retained items: All three wounds packed with saline-moistened kerlix EBL: 20cc Complications: none  Description of procedure: After obtaining informed consent the patient was taken to the operating room and placed supine on operating room table wheregeneral LMA anesthesia was initiated, preoperative antibiotics were administered, SCDs applied, and a formal timeout was performed. The left leg was prepped and draped with betadine. Cautery was used to excise the escar and dessicated fat from the previously debrided left medial thigh wound down to fat. The tissue at the base of this wound was characterized by pale, woody edema-type fat and fat necrosis. I did not debride further as this woody edema seemed to extend all along the medial aspect of her leg. I then turned to the lower leg, where a 6x6cm superficial bulla had been present over the anteromedial skin. The blister was removed and a 2 cubic centimeter plug of skin and soft tissue was excised from the ecchymotic appearing area that had been under the blister. The Tissue here was also fat necrosis and firm edema with murky,  slightly purulent edema fluid. About half wasybetween the two wounds and a few cm posterior on the medial left leg a third 2 cubic centimeter plug of skin and soft tissue was excised with similar tissue findings to the previous. Hemostasis was ensured in all three wounds which were irrigated and gently packed with saline moistened kerlix. Dry gauze, ABDs and a loose ACE wrap were used to secure the dressing.  The patient was then awakened, extubated and taken to PACU in stable condition.   All counts were correct at the completion of the case.

## 2016-06-27 NOTE — Transfer of Care (Signed)
Immediate Anesthesia Transfer of Care Note  Patient: Rita FieldingSally J Watkins  Procedure(s) Performed: Procedure(s): IRRIGATION AND DEBRIDEMENT LEFT LEG WITH SOTFT TISSUE BIOPSY (N/A)  Patient Location: PACU  Anesthesia Type:General  Level of Consciousness: awake, alert  and oriented  Airway & Oxygen Therapy: Patient Spontanous Breathing and Patient connected to face mask oxygen  Post-op Assessment: Report given to RN and Post -op Vital signs reviewed and stable  Post vital signs: Reviewed and stable  Last Vitals:  Vitals:   06/27/16 0434 06/27/16 1401  BP: (!) 143/61 (!) 147/49  Pulse: 86 78  Resp: 18 18  Temp: 37.2 C 37.8 C    Last Pain:  Vitals:   06/27/16 1401  TempSrc: Oral  PainSc:       Patients Stated Pain Goal: 2 (06/27/16 1215)  Complications: No apparent anesthesia complications

## 2016-06-27 NOTE — Anesthesia Postprocedure Evaluation (Addendum)
Anesthesia Post Note  Patient: Rita Watkins  Procedure(s) Performed: Procedure(s) (LRB): IRRIGATION AND DEBRIDEMENT LEFT LEG WITH SOTFT TISSUE BIOPSY (N/A)  Patient location during evaluation: PACU Anesthesia Type: General Level of consciousness: awake and alert Pain management: pain level controlled Vital Signs Assessment: post-procedure vital signs reviewed and stable Respiratory status: spontaneous breathing, nonlabored ventilation, respiratory function stable and patient connected to nasal cannula oxygen Cardiovascular status: blood pressure returned to baseline and stable Postop Assessment: no signs of nausea or vomiting Anesthetic complications: no       Last Vitals:  Vitals:   06/27/16 1700 06/27/16 1801  BP: (!) 126/59 (!) 110/56  Pulse: 77 64  Resp: 13 14  Temp: 37.2 C 36.9 C    Last Pain:  Vitals:   06/27/16 1801  TempSrc: Oral  PainSc:                  Shelton SilvasKevin D Frederick Klinger

## 2016-06-27 NOTE — Anesthesia Procedure Notes (Signed)
Procedure Name: LMA Insertion Date/Time: 06/27/2016 3:13 PM Performed by: Enriqueta ShutterWILLIFORD, Virginia Curl D Pre-anesthesia Checklist: Patient identified, Emergency Drugs available, Suction available and Patient being monitored Patient Re-evaluated:Patient Re-evaluated prior to inductionOxygen Delivery Method: Circle system utilized Preoxygenation: Pre-oxygenation with 100% oxygen Intubation Type: IV induction Ventilation: Mask ventilation without difficulty LMA: LMA inserted LMA Size: 4.0 Tube type: Oral Number of attempts: 1 Placement Confirmation: positive ETCO2 and breath sounds checked- equal and bilateral Tube secured with: Tape Dental Injury: Teeth and Oropharynx as per pre-operative assessment

## 2016-06-27 NOTE — Progress Notes (Signed)
      Subjective/Chief Complaint: No acute events.    Objective: Vital signs in last 24 hours: Temp:  [97.5 F (36.4 C)-98.9 F (37.2 C)] 98.9 F (37.2 C) (05/22 0434) Pulse Rate:  [69-86] 86 (05/22 0434) Resp:  [18-20] 18 (05/22 0434) BP: (129-153)/(49-81) 143/61 (05/22 0434) SpO2:  [95 %-99 %] 98 % (05/22 0434) Weight:  [113 kg (249 lb 1.6 oz)-113.4 kg (250 lb)] 113 kg (249 lb 1.6 oz) (05/21 2051)    Intake/Output from previous day: 05/21 0701 - 05/22 0700 In: 120 [P.O.:120] Out: 1200 [Urine:1200] Intake/Output this shift: Total I/O In: -  Out: 450 [Urine:450]  General appearance: alert and cooperative Resp: unlabored respirations GI: soft, non-tender; bowel sounds normal; no masses,  no organomegaly Extremities: unchanged LLE wounds and edema  Lab Results:   Recent Labs  06/26/16 1753 06/27/16 0359  WBC 13.2* 9.8  HGB 10.9* 10.3*  HCT 33.3* 31.8*  PLT 430* 363   BMET  Recent Labs  06/26/16 1753 06/27/16 0359  NA 137 138  K 3.2* 3.2*  CL 99* 100*  CO2 27 28  GLUCOSE 105* 100*  BUN 22* 16  CREATININE 0.99 0.76  CALCIUM 9.1 8.9   PT/INR No results for input(s): LABPROT, INR in the last 72 hours. ABG No results for input(s): PHART, HCO3 in the last 72 hours.  Invalid input(s): PCO2, PO2  Studies/Results: No results found.  Anti-infectives: Anti-infectives    None      Assessment/Plan: s/p Procedure(s): IRRIGATION AND DEBRIDEMENT LEFT LEG WITH SOTFT TISSUE BIOPSY (N/A) To OR today- plan to excise the eschar on the left medial thigh and take several soft tissue biopsies from the leg to be sent for micro, AFB and fungal culture  LOS: 1 day    Berna BueChelsea A Jelesa Mangini 06/27/2016

## 2016-06-28 ENCOUNTER — Encounter (HOSPITAL_COMMUNITY): Payer: Self-pay | Admitting: Surgery

## 2016-06-28 DIAGNOSIS — A319 Mycobacterial infection, unspecified: Principal | ICD-10-CM

## 2016-06-28 DIAGNOSIS — R238 Other skin changes: Secondary | ICD-10-CM

## 2016-06-28 DIAGNOSIS — L03116 Cellulitis of left lower limb: Secondary | ICD-10-CM

## 2016-06-28 LAB — CBC
HCT: 28.4 % — ABNORMAL LOW (ref 36.0–46.0)
Hemoglobin: 9.2 g/dL — ABNORMAL LOW (ref 12.0–15.0)
MCH: 29.1 pg (ref 26.0–34.0)
MCHC: 32.4 g/dL (ref 30.0–36.0)
MCV: 89.9 fL (ref 78.0–100.0)
PLATELETS: 298 10*3/uL (ref 150–400)
RBC: 3.16 MIL/uL — ABNORMAL LOW (ref 3.87–5.11)
RDW: 13.1 % (ref 11.5–15.5)
WBC: 9.6 10*3/uL (ref 4.0–10.5)

## 2016-06-28 LAB — BASIC METABOLIC PANEL
ANION GAP: 11 (ref 5–15)
BUN: 16 mg/dL (ref 6–20)
CALCIUM: 8.6 mg/dL — AB (ref 8.9–10.3)
CO2: 30 mmol/L (ref 22–32)
Chloride: 98 mmol/L — ABNORMAL LOW (ref 101–111)
Creatinine, Ser: 0.88 mg/dL (ref 0.44–1.00)
GFR calc Af Amer: >60 mL/min (ref >60)
GLUCOSE: 89 mg/dL (ref 65–99)
Potassium: 3.3 mmol/L — ABNORMAL LOW (ref 3.5–5.1)
Sodium: 139 mmol/L (ref 135–145)

## 2016-06-28 MED ORDER — POTASSIUM CHLORIDE CRYS ER 20 MEQ PO TBCR
40.0000 meq | EXTENDED_RELEASE_TABLET | Freq: Once | ORAL | Status: AC
  Administered 2016-06-28: 40 meq via ORAL
  Filled 2016-06-28: qty 2

## 2016-06-28 MED ORDER — ENOXAPARIN SODIUM 40 MG/0.4ML ~~LOC~~ SOLN
40.0000 mg | SUBCUTANEOUS | Status: DC
  Filled 2016-06-28 (×2): qty 0.4

## 2016-06-28 NOTE — Progress Notes (Signed)
Subjective: No new complaints   Antibiotics:  Anti-infectives    Start     Dose/Rate Route Frequency Ordered Stop   06/27/16 2200  clarithromycin (BIAXIN) tablet 500 mg     500 mg Oral Every 12 hours 06/27/16 1744     06/27/16 1830  sodium chloride 0.9 % 250 mL with cefOXitin (MEFOXIN) 12 g infusion     11 mL/hr  Intravenous Continuous 06/27/16 1806        Medications: Scheduled Meds: . clarithromycin  500 mg Oral Q12H  . enoxaparin (LOVENOX) injection  40 mg Subcutaneous Q24H  . gabapentin  300 mg Oral QHS  . losartan  100 mg Oral Daily   And  . hydrochlorothiazide  12.5 mg Oral Daily   Continuous Infusions: . sodium chloride 0.9 % 250 mL with cefOXitin (MEFOXIN) 12 g infusion 11 mL/hr at 06/27/16 1923   PRN Meds:.acetaminophen **OR** acetaminophen, ALPRAZolam, morphine injection, ondansetron **OR** ondansetron (ZOFRAN) IV, oxyCODONE    Objective: Weight change:   Intake/Output Summary (Last 24 hours) at 06/28/16 1511 Last data filed at 06/28/16 1456  Gross per 24 hour  Intake          2406.78 ml  Output             2475 ml  Net           -68.22 ml   Blood pressure (!) 125/43, pulse 69, temperature 98.5 F (36.9 C), temperature source Oral, resp. rate 16, height 5\' 9"  (1.753 m), weight 249 lb 1.6 oz (113 kg), SpO2 95 %. Temp:  [98 F (36.7 C)-99 F (37.2 C)] 98.5 F (36.9 C) (05/23 1454) Pulse Rate:  [60-79] 69 (05/23 1454) Resp:  [12-16] 16 (05/23 1454) BP: (104-137)/(42-60) 125/43 (05/23 0533) SpO2:  [93 %-100 %] 95 % (05/23 1454)  Physical Exam: General: Alert and awake, oriented x3, not in any acute distress. HEENT: anicteric sclera,  EOMI CVS regular rate,  Chest:  no wheezing, resp distress Abdomen: soft nondistended, Extremities: skin: bandage in place  Neuro: nonfocal  CBC:  CBC Latest Ref Rng & Units 06/28/2016 06/27/2016 06/26/2016  WBC 4.0 - 10.5 K/uL 9.6 9.8 13.2(H)  Hemoglobin 12.0 - 15.0 g/dL 4.0(J) 10.3(L) 10.9(L)    Hematocrit 36.0 - 46.0 % 28.4(L) 31.8(L) 33.3(L)  Platelets 150 - 400 K/uL 298 363 430(H)      BMET  Recent Labs  06/27/16 0359 06/28/16 0508  NA 138 139  K 3.2* 3.3*  CL 100* 98*  CO2 28 30  GLUCOSE 100* 89  BUN 16 16  CREATININE 0.76 0.88  CALCIUM 8.9 8.6*     Liver Panel  No results for input(s): PROT, ALBUMIN, AST, ALT, ALKPHOS, BILITOT, BILIDIR, IBILI in the last 72 hours.     Sedimentation Rate  Recent Labs  06/27/16 0359  ESRSEDRATE 120*   C-Reactive Protein  Recent Labs  06/27/16 0359  CRP 18.7*    Micro Results: Recent Results (from the past 720 hour(s))  Blood Culture (routine x 2)     Status: None   Collection Time: 06/05/16  9:35 AM  Result Value Ref Range Status   Specimen Description BLOOD LEFT ARM  Final   Special Requests   Final    BOTTLES DRAWN AEROBIC AND ANAEROBIC Blood Culture adequate volume   Culture   Final    NO GROWTH 5 DAYS Performed at [facility], [address], [place], [place] [address]  Report Status 06/10/2016 FINAL  Final  Blood Culture (routine x 2)     Status: None   Collection Time: 06/05/16 10:20 AM  Result Value Ref Range Status   Specimen Description BLOOD LEFT ARM  Final   Special Requests   Final    BOTTLES DRAWN AEROBIC AND ANAEROBIC Blood Culture adequate volume   Culture   Final    NO GROWTH 5 DAYS Performed at [facility], [address], [place], [place] [address]    Report Status 06/10/2016 FINAL  Final  Surgical pcr screen     Status: None   Collection Time: 06/09/16  1:07 AM  Result Value Ref Range Status   MRSA, PCR NEGATIVE NEGATIVE Final   Staphylococcus aureus NEGATIVE NEGATIVE Final    Comment:        The Xpert SA Assay (FDA approved for NASAL specimens in patients over 4 years of age), is one component of a comprehensive surveillance program.  Test performance has been validated by [facility] for patients greater than or equal to 53 year old. It is not  intended to diagnose infection nor to guide or monitor treatment.   Anaerobic culture     Status: None (Preliminary result)   Collection Time: 06/09/16  8:13 AM  Result Value Ref Range Status   Specimen Description WOUND LEFT THIGH PROXIMAL  Final   Special Requests NONE  Final   Gram Stain   Final    FEW WBC PRESENT,BOTH PMN AND MONONUCLEAR NO ORGANISMS SEEN    Culture   Final    NO GROWTH 5 DAYS CONTINUING TO HOLD FOR 21 DAYS Performed at [facility], [address] [place], [place] [address]    Report Status PENDING  Incomplete  Fungus Culture With Stain     Status: None (Preliminary result)   Collection Time: 06/09/16  8:13 AM  Result Value Ref Range Status   Fungus Stain Final report  Final    Comment: (NOTE) Performed At: [facility] [address] [place], [place] [id number] Mila Homer MD WG:[id number]    Fungus (Mycology) Culture PENDING  Incomplete   Fungal Source WOUND  Final    Comment: LEFT THIGH PROXIMAL   Acid Fast Smear (AFB)     Status: None   Collection Time: 06/09/16  8:13 AM  Result Value Ref Range Status   AFB Specimen Processing Concentration  Final   Acid Fast Smear Negative  Final    Comment: (NOTE) Performed At: [facility] [address] [place], [place] [id number] Mila Homer MD [id number]    Source (AFB) WOUND  Final    Comment: LEFT THIGH PROXIMAL   Aerobic Culture (superficial specimen)     Status: None (Preliminary result)   Collection Time: 06/09/16  8:13 AM  Result Value Ref Range Status   Specimen Description WOUND LEFT THIGH PROXIMAL  Final   Special Requests NONE  Final   Gram Stain NO WBC SEEN NO ORGANISMS SEEN   Final   Culture   Final    RARE STAPHYLOCOCCUS SPECIES (COAGULASE NEGATIVE) CONTINUING TO HOLD FOR 21 DAYS TO LOOK FOR NOCARDIA Performed at [facility], [address] [place], [place] [address]    Report Status PENDING  Incomplete  Fungus Culture Result      Status: None   Collection Time: 06/09/16  8:13 AM  Result Value Ref Range Status   Result 1 Comment  Final    Comment: (NOTE)  KOH/Calcofluor preparation:  no fungus observed. Performed At: [facility] [address] [place], [place] [id number] Mila Homer MD WU:[id number]   Acid Fast Smear (AFB)     Status: None   Collection Time: 06/09/16  8:17 AM  Result Value Ref Range Status   AFB Specimen Processing Concentration  Final   Acid Fast Smear Negative  Final    Comment: (NOTE) Performed At: [facility] [address] [place], [place] [id number] Mila Homer MD VH:[id number]    Source (AFB) WOUND  Final    Comment: LEFT THIGH MEDIAL   Aerobic/Anaerobic Culture (surgical/deep wound)     Status: None (Preliminary result)   Collection Time: 06/09/16  8:17 AM  Result Value Ref Range Status   Specimen Description WOUND LEFT THIGH MEDIAL  Final   Special Requests Normal  Final   Gram Stain   Final    FEW WBC PRESENT,BOTH PMN AND MONONUCLEAR NO ORGANISMS SEEN    Culture   Final    RARE STAPHYLOCOCCUS SPECIES (COAGULASE NEGATIVE) CONTINUING TO HOLD FOR 21 DAYS TO LOOK FOR NOCARDIA Performed at [facility], [address], [place], [place] [address]    Report Status PENDING  Incomplete   Organism ID, Bacteria STAPHYLOCOCCUS SPECIES (COAGULASE NEGATIVE)  Final      Susceptibility   Staphylococcus species (coagulase negative) - MIC*    CIPROFLOXACIN 4 RESISTANT Resistant     ERYTHROMYCIN >=8 RESISTANT Resistant     GENTAMICIN <=0.5 SENSITIVE Sensitive     OXACILLIN >=4 RESISTANT Resistant     TETRACYCLINE <=1 SENSITIVE Sensitive     VANCOMYCIN 1 SENSITIVE Sensitive     TRIMETH/SULFA <=10 SENSITIVE Sensitive     CLINDAMYCIN >=8 RESISTANT Resistant     RIFAMPIN <=0.5 SENSITIVE Sensitive     Inducible Clindamycin NEGATIVE Sensitive     * RARE STAPHYLOCOCCUS SPECIES (COAGULASE NEGATIVE)  Fungus Culture With Stain     Status: None (Preliminary  result)   Collection Time: 06/09/16  8:17 AM  Result Value Ref Range Status   Fungus Stain Final report  Final    Comment: (NOTE) Performed At: [facility] [address] [place], [place] [id number] Mila Homer MD [id number]    Fungus (Mycology) Culture PENDING  Incomplete   Fungal Source WOUND  Final    Comment: LEFT THIGH MEDIAL Performed at [facility], [address], [place], [place] [address]   Fungus Culture Result     Status: None   Collection Time: 06/09/16  8:17 AM  Result Value Ref Range Status   Result 1 Comment  Final    Comment: (NOTE) KOH/Calcofluor preparation:  no fungus observed. Performed At: [facility] [address] [place], [place] [id number] Mila Homer MD OA:[id number]   Acid Fast Smear (AFB)     Status: None   Collection Time: 06/09/16  1:27 PM  Result Value Ref Range Status   AFB Specimen Processing Comment  Final    Comment: Tissue Grinding and Digestion/Decontamination   Acid Fast Smear Negative  Final    Comment: (NOTE) Performed At: [facility] [address] [place], [place] [id number] Mila Homer MD DD:[id number]    Source (AFB) LEFT  Final    Comment: LEG  Acid Fast Smear (AFB)     Status: None   Collection Time: 06/09/16  1:28 PM  Result Value Ref Range Status   AFB Specimen Processing Comment  Final    Comment: Tissue Grinding and  Digestion/Decontamination   Acid Fast Smear Negative  Final    Comment: (NOTE) Performed At: [facility] [address] [place], [place] [id number] Mila HomerHancock William F MD WU:[phone]    Source (AFB) LEFT  Final    Comment: LEG  Acid Fast Smear (AFB)     Status: None   Collection Time: 06/09/16  1:28 PM  Result Value Ref Range Status   AFB Specimen Processing Comment  Final    Comment: Tissue Grinding and Digestion/Decontamination   Acid Fast Smear Negative  Final    Comment: (NOTE) Performed At: [facility] [address]  [place], [place] [id number] Mila HomerHancock William F MD VH:[phone]    Source (AFB) LEFT  Final    Comment: LEG  Surgical pcr screen     Status: None   Collection Time: 06/27/16  1:10 PM  Result Value Ref Range Status   MRSA, PCR NEGATIVE NEGATIVE Final   Staphylococcus aureus NEGATIVE NEGATIVE Final    Comment:        The Xpert SA Assay (FDA approved for NASAL specimens in patients over 321 years of age), is one component of a comprehensive surveillance program.  Test performance has been validated by [facility] for patients greater than or equal to 63 year old. It is not intended to diagnose infection nor to guide or monitor treatment.     Studies/Results: No results found.    Assessment/Plan:  INTERVAL HISTORY:  Sp repeat surgery by CCS for AFB and fungal cultures   Principal Problem:   Cellulitis of left lower extremity Active Problems:   HTN (hypertension)    Rita Watkins is a 61 y.o. 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  #1 Lymphangitis infection from toe to ankle to thigh:  I started cefoxitin 12 gram daily continuous infusion  Biaxin 500 mg BID  I have discussed with Jeri ModenaPam Chandler with [facility]. Patient WILL NOT qualify for charity due to income exceeding their threshold  She will see what price would be for IV cefoxitin  CM looking into biaxin, hoping this can be provided [facility]  It is in long term interest of not spending money over and over again on readmissions and repeat surgeries to get here therapy  Biaxin and zyvox COULD be tried as outpatient oral therapy BUT the zyvox is not that reliable % wise vs M abscessus and not easy to take lon gtemr with out marrow toxiity  Pt is allergic to FQ.  I spent greater than 35 minutes with the patient including greater than 50% of time  in face to face counsel of the patient and in coordination of her care.  Greatly appreciate CM, CCS, WOC and primary team as well as Pharmacy.    LOS: 2 days   Acey LavCornelius Van Dam 06/28/2016, 3:11 PM

## 2016-06-28 NOTE — Progress Notes (Signed)
Spoke with patient at bedside. States she lives at home alone, still works and was otherwise independent. Concerned about LOS in the hospital. Discussed HH needs for wound care and [facility]. Patient will need teachable caregiver, states she has not family but does have friends that she can ask. Encouraged her to contact them to have a plan at d/c. Will have [facility] to assess for charity, discussed MATCH program for antibiotics, currently pays out of pocket for meds, has a PCP. Will continue to follow for d/c needs.

## 2016-06-28 NOTE — Progress Notes (Signed)
Patient ID: Rita Watkins Vardaman, female   DOB: 1955-07-10, 61 y.o.   MRN: [id number]    PROGRESS NOTE    Rita Watkins Hooper  [id number] DOB: 1955-07-10 DOA: 06/26/2016  PCP: Esperanza RichtersSaguier, Edward, PA-C   Brief Narrative:  Pt is 61 yo female with known HTN, presented to ED on 5/21 with new necrotic like lesion on left lower extremity. 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 Surgery team has done biopsy for further evaluation.   Assessment & Plan:   Principal Problem:   Cellulitis of left lower extremity - lymphangitis spread of an infection fron tow to an ankle to thigh - per ID, abx changed to cefoxitin 12 gm daily cont infusion, biaxin 500 mg BID - appreciate ID team following  - WBC is now WNL  Active Problems:   Thrombocytosis - reactive from acute infection - resolved     HTN (hypertension), essential - reasonable inpatient control   Morbid obesity - Body mass index is 36.79 kg/m.   Hypokalemia - supplement and repeat BMP In AM   DVT prophylaxis: Lovenox SQ Code Status: Full  Family Communication: Patient at bedside  Disposition Plan: Home once ABX issue resolved   Consultants:   ID  Surgery   Procedures:   Left LE biopsy 5/22 -->  Antimicrobials:   ABX changed to cefoxitin, biaxin per ID  Subjective: Pt reports feeling better this AM but still sore at the site of biopsy.   Objective: Vitals:   06/27/16 1900 06/27/16 2155 06/28/16 0533 06/28/16 1454  BP: (!) 104/42 (!) 108/44 (!) 125/43   Pulse: 65 67 60 69  Resp: 14 16 16 16   Temp: 98.9 F (37.2 C) 98.7 F (37.1 C) 98.6 F (37 C) 98.5 F (36.9 C)  TempSrc: Oral Oral Oral Oral  SpO2: 93% 100% 96% 95%  Weight:      Height:        Intake/Output Summary (Last 24 hours) at 06/28/16 1626 Last data filed at 06/28/16 1456  Gross per 24 hour  Intake          1206.78 ml  Output             2475 ml  Net         -1268.22 ml   Filed Weights   06/26/16 1353 06/26/16 2051  Weight: 113.4 kg (250 lb) 113 kg (249 lb 1.6 oz)    Examination:  General exam: Appears calm and comfortable  Respiratory system: Clear to auscultation. Respiratory effort normal. Cardiovascular system: S1 & S2 heard, RRR. No JVD, rubs, gallops or clicks. No pedal edema. Gastrointestinal system: Abdomen is nondistended, soft and nontender. No organomegaly or masses felt.  Central nervous system: Alert and oriented. No focal neurological deficits. Extremities: Symmetric 5 x 5 power.Bandage in place on left lower extremity  Psychiatry: Judgement and insight appear normal. Mood & affect appropriate.   Data Reviewed: I have personally reviewed following labs and imaging studies  CBC:  Recent Labs Lab 06/26/16 1753 06/27/16 0359 06/28/16 0508  WBC 13.2* 9.8 9.6  NEUTROABS 8.5*  --   --   HGB 10.9* 10.3* 9.2*  HCT 33.3* 31.8* 28.4*  MCV 89.3 89.1 89.9  PLT 430* 363 298   Basic Metabolic Panel:  Recent Labs Lab 06/26/16 1753 06/27/16 0359 06/27/16 1054 06/28/16 0508  NA 137 138  --  139  K 3.2* 3.2*  --  3.3*  CL 99* 100*  --  98*  CO2 27 28  --  30  GLUCOSE 105* 100*  --  89  BUN 22* 16  --  16  CREATININE 0.99 0.76  --  0.88  CALCIUM 9.1 8.9  --  8.6*  MG  --   --  1.8  --    Urine analysis:    Component Value Date/Time   COLORURINE YELLOW 06/08/2016 1244   APPEARANCEUR CLEAR 06/08/2016 1244   LABSPEC 1.020 06/08/2016 1244   PHURINE 5.0 06/08/2016 1244   GLUCOSEU NEGATIVE 06/08/2016 1244   HGBUR MODERATE (A) 06/08/2016 1244   BILIRUBINUR NEGATIVE 06/08/2016 1244   KETONESUR NEGATIVE 06/08/2016 1244   PROTEINUR NEGATIVE 06/08/2016 1244   NITRITE NEGATIVE 06/08/2016 1244   LEUKOCYTESUR SMALL (A) 06/08/2016 1244   Recent Results (from the past 240 hour(s))  Surgical pcr screen     Status: None   Collection Time: 06/27/16  1:10 PM  Result Value Ref Range Status   MRSA, PCR NEGATIVE NEGATIVE Final   Staphylococcus aureus NEGATIVE  NEGATIVE Final    Comment:        The Xpert SA Assay (FDA approved for NASAL specimens in patients over 20 years of age), is one component of a comprehensive surveillance program.  Test performance has been validated by [facility] for patients greater than or equal to 88 year old. It is not intended to diagnose infection nor to guide or monitor treatment.      Radiology Studies: No results found.  Scheduled Meds: . clarithromycin  500 mg Oral Q12H  . enoxaparin (LOVENOX) injection  40 mg Subcutaneous Q24H  . gabapentin  300 mg Oral QHS  . losartan  100 mg Oral Daily   And  . hydrochlorothiazide  12.5 mg Oral Daily   Continuous Infusions: . sodium chloride 0.9 % 250 mL with cefOXitin (MEFOXIN) 12 g infusion 11 mL/hr at 06/27/16 1923     LOS: 2 days    Time spent: 20 minutes    Debbora Presto, MD [facility] Pager [phone]  If 7PM-7AM, please contact night-coverage [email or web address] Password [facility] 06/28/2016, 4:26 PM

## 2016-06-28 NOTE — Consult Note (Signed)
WOC Nurse wound consult note Reason for Consult: dressing change options Wound type: full thickness Pressure Injury POA: na Measurement: per Dr. Eustaquio Boydenonnors note  11cm x 6cm x 1.5 and 2cm x 2cm x2cm Wound bed:red granulating Drainage (amount, consistency, odor) slight Periwound: intact Dressing procedure/placement/frequency: Continue with NS moistened gauze BID.  Pt states an inability to purchase other dressing supplies and this will be sufficient anyway.  Pt has been trained on dressing change procedure. We will not follow, but will remain available to this patient, to nursing, and the medical and/or surgical teams.  Please re-consult if we need to assist further.    Barnett HatterMelinda Soul Hackman, RN-C, WTA-C Wound Treatment Associate

## 2016-06-28 NOTE — Progress Notes (Signed)
Patient ID: Rita FieldingSally J Mojica, female   DOB: 1955-12-23, 61 y.o.   MRN: [id number]  [facility]  1 Day Post-Op  Subjective: CC- LLE soft tissue infection Patient reports LLE pain but overall doing well. Tolerated first dressing change. She is ambulating with walker with no issues. Tolerating diet.  Wondering when she will be discharged because she does not have insurance and cannot afford to stay in the hospital very long.  Objective: Vital signs in last 24 hours: Temp:  [98 F (36.7 C)-100.1 F (37.8 C)] 98.6 F (37 C) (05/23 0533) Pulse Rate:  [60-79] 60 (05/23 0533) Resp:  [12-18] 16 (05/23 0533) BP: (104-147)/(42-60) 125/43 (05/23 0533) SpO2:  [93 %-100 %] 96 % (05/23 0533)    Intake/Output from previous day: 05/22 0701 - 05/23 0700 In: 2166.8 [P.O.:750; I.V.:1416.8] Out: 2275 [Urine:2275] Intake/Output this shift: No intake/output data recorded.  PE: Gen:  Alert, NAD, pleasant Pulm:  Effort normal Abd: Soft, NT/ND, +BS LLE: foot WWP with 2+ DP pulse, SILT. 2+ edema. 3 surgical wounds along anterior/medial lower leg and distal thigh, all are clean with no purulent drainage or foul odor  Lab Results:   Recent Labs  06/27/16 0359 06/28/16 0508  WBC 9.8 9.6  HGB 10.3* 9.2*  HCT 31.8* 28.4*  PLT 363 298   BMET  Recent Labs  06/27/16 0359 06/28/16 0508  NA 138 139  K 3.2* 3.3*  CL 100* 98*  CO2 28 30  GLUCOSE 100* 89  BUN 16 16  CREATININE 0.76 0.88  CALCIUM 8.9 8.6*   PT/INR No results for input(s): LABPROT, INR in the last 72 hours. CMP     Component Value Date/Time   NA 139 06/28/2016 0508   K 3.3 (L) 06/28/2016 0508   CL 98 (L) 06/28/2016 0508   CO2 30 06/28/2016 0508   GLUCOSE 89 06/28/2016 0508   BUN 16 06/28/2016 0508   CREATININE 0.88 06/28/2016 0508   CREATININE 0.77 07/24/2014 1443   CALCIUM 8.6 (L) 06/28/2016 0508   PROT 8.0 06/05/2016 0939   ALBUMIN 3.4 (L) 06/05/2016 0939   AST 13 (L) 06/05/2016 0939   ALT  11 (L) 06/05/2016 0939   ALKPHOS 82 06/05/2016 0939   BILITOT 0.6 06/05/2016 0939   GFRNONAA >60 06/28/2016 0508   GFRNONAA >89 01/17/2013 1032   GFRAA >60 06/28/2016 0508   GFRAA >89 01/17/2013 1032   Lipase  No results found for: LIPASE     Studies/Results: No results found.  Anti-infectives: Anti-infectives    Start     Dose/Rate Route Frequency Ordered Stop   06/27/16 2200  clarithromycin (BIAXIN) tablet 500 mg     500 mg Oral Every 12 hours 06/27/16 1744     06/27/16 1830  sodium chloride 0.9 % 250 mL with cefOXitin (MEFOXIN) 12 g infusion     11 mL/hr  Intravenous Continuous 06/27/16 1806         Assessment/Plan Chronic soft tissue infection S/p Excisional debridement of left medial thigh eschar (wound 11 x 6 x 1.5cm); Biopsy of left lower leg skin and soft tissue (wound 2 x 2 x 2cm), biopsy of left posterior lower leg skin and soft tissue (wound 2 x 2 x 2cm) 5/22 Dr. Fredricka Bonineonnor - POD 1 - cultures pending - damp to dry dressing changes BID - elevate LLE for swelling  HTN Hypokalemia - replace  ID - mefoxin 5/22>>, biaxin 5/22>> VTE - SCDs, lovenox FEN - carb modified diet  Plan -  Will consult WOC for wound care recommendations; continue damp to dry dressing changes BID. Antibiotics per ID. Follow cultures.   LOS: 2 days    Edson Snowball , [facility] 06/28/2016, 8:55 AM Pager: [phone] Consults: [phone] Mon-Fri 7:00 am-4:30 pm Sat-Sun 7:00 am-11:30 am

## 2016-06-29 DIAGNOSIS — Z5329 Procedure and treatment not carried out because of patient's decision for other reasons: Secondary | ICD-10-CM

## 2016-06-29 LAB — CBC
HEMATOCRIT: 27.4 % — AB (ref 36.0–46.0)
HEMOGLOBIN: 8.9 g/dL — AB (ref 12.0–15.0)
MCH: 28.9 pg (ref 26.0–34.0)
MCHC: 32.5 g/dL (ref 30.0–36.0)
MCV: 89 fL (ref 78.0–100.0)
Platelets: 315 10*3/uL (ref 150–400)
RBC: 3.08 MIL/uL — AB (ref 3.87–5.11)
RDW: 13.2 % (ref 11.5–15.5)
WBC: 10 10*3/uL (ref 4.0–10.5)

## 2016-06-29 LAB — ANAEROBIC CULTURE

## 2016-06-29 LAB — BASIC METABOLIC PANEL
Anion gap: 9 (ref 5–15)
BUN: 12 mg/dL (ref 6–20)
CHLORIDE: 98 mmol/L — AB (ref 101–111)
CO2: 31 mmol/L (ref 22–32)
CREATININE: 0.87 mg/dL (ref 0.44–1.00)
Calcium: 8.5 mg/dL — ABNORMAL LOW (ref 8.9–10.3)
GFR calc Af Amer: >60 mL/min (ref >60)
GFR calc non Af Amer: >60 mL/min (ref >60)
GLUCOSE: 100 mg/dL — AB (ref 65–99)
POTASSIUM: 3.6 mmol/L (ref 3.5–5.1)
Sodium: 138 mmol/L (ref 135–145)

## 2016-06-29 MED ORDER — MORPHINE SULFATE (PF) 2 MG/ML IV SOLN
2.0000 mg | INTRAVENOUS | Status: DC | PRN
  Administered 2016-06-29 (×3): 2 mg via INTRAVENOUS
  Filled 2016-06-29 (×3): qty 1

## 2016-06-29 MED ORDER — LINEZOLID 600 MG PO TABS
600.0000 mg | ORAL_TABLET | Freq: Two times a day (BID) | ORAL | Status: DC
  Administered 2016-06-29 – 2016-06-30 (×3): 600 mg via ORAL
  Filled 2016-06-29 (×3): qty 1

## 2016-06-29 NOTE — Discharge Instructions (Signed)
WOUND CARE: °- dressing to be changed twice daily °- supplies: sterile saline, kerlix, scissors, ABD pads, tape  °- remove dressing and all packing carefully, moistening with sterile saline as needed to avoid packing/internal dressing sticking to the wound. °- clean edges of skin around the wound with water/gauze, making sure there is no tape debris or leakage left on skin that could cause skin irritation or breakdown. °- dampen and clean kerlix with sterile saline and pack wound from wound base to skin level, making sure to take note of any possible areas of wound tracking, tunneling and packing appropriately. Wound can be packed loosely. Trim kerlix to size if a whole kerlix is not required. °- cover wound with a dry ABD pad and secure with tape.  °- write the date/time on the dry dressing/tape to better track when the last dressing change occurred. °- change dressing as needed if leakage occurs, wound gets contaminated, or patient requests to shower. °- patient may shower daily with wound open and following the shower the wound should be dried and a clean dressing placed.  ° °

## 2016-06-29 NOTE — Progress Notes (Signed)
Spoke with patient again at bedside. She has declined PICC and IV abx until cultures have completed. She currently is on Zyvox and Biaxin. Provided her with coupons to reduce the cost of these. [facility], nursing visits would be about $180 per visit. She has decided she can do wound care on her own, nurses have begun teaching. Will continue to follow for d/c needs.

## 2016-06-29 NOTE — Progress Notes (Signed)
Subjective: No new complaints   Antibiotics:  Anti-infectives    Start     Dose/Rate Route Frequency Ordered Stop   06/29/16 1000  linezolid (ZYVOX) tablet 600 mg     600 mg Oral Every 12 hours 06/29/16 0947     06/27/16 2200  clarithromycin (BIAXIN) tablet 500 mg     500 mg Oral Every 12 hours 06/27/16 1744     06/27/16 1830  sodium chloride 0.9 % 250 mL with cefOXitin (MEFOXIN) 12 g infusion  Status:  Discontinued     11 mL/hr  Intravenous Continuous 06/27/16 1806 06/29/16 0947      Medications: Scheduled Meds: . clarithromycin  500 mg Oral Q12H  . enoxaparin (LOVENOX) injection  40 mg Subcutaneous Q24H  . gabapentin  300 mg Oral QHS  . losartan  100 mg Oral Daily   And  . hydrochlorothiazide  12.5 mg Oral Daily  . linezolid  600 mg Oral Q12H   Continuous Infusions:  PRN Meds:.acetaminophen **OR** acetaminophen, ALPRAZolam, morphine injection, ondansetron **OR** ondansetron (ZOFRAN) IV, oxyCODONE    Objective: Weight change:   Intake/Output Summary (Last 24 hours) at 06/29/16 1748 Last data filed at 06/29/16 1500  Gross per 24 hour  Intake             1245 ml  Output             2650 ml  Net            -1405 ml   Blood pressure (!) 144/58, pulse 91, temperature 99.1 F (37.3 C), temperature source Oral, resp. rate 18, height 5\' 9"  (1.753 m), weight 249 lb 1.6 oz (113 kg), SpO2 96 %. Temp:  [98.4 F (36.9 C)-99.1 F (37.3 C)] 99.1 F (37.3 C) (05/24 1410) Pulse Rate:  [73-91] 91 (05/24 1410) Resp:  [16-18] 18 (05/24 1410) BP: (139-148)/(51-58) 144/58 (05/24 1410) SpO2:  [95 %-96 %] 96 % (05/24 1410)  Physical Exam: General: Alert and awake, oriented x3, not in any acute distress. HEENT: anicteric sclera,  EOMI CVS regular rate,  Chest:  no wheezing, resp distress Abdomen: soft nondistended, Extremities: skin: bandage in place  Neuro: nonfocal  CBC:  CBC Latest Ref Rng & Units 06/29/2016 06/28/2016 06/27/2016  WBC 4.0 - 10.5 K/uL 10.0 9.6  9.8  Hemoglobin 12.0 - 15.0 g/dL 0.8(M) 5.7(Q) 10.3(L)  Hematocrit 36.0 - 46.0 % 27.4(L) 28.4(L) 31.8(L)  Platelets 150 - 400 K/uL 315 298 363      BMET  Recent Labs  06/28/16 0508 06/29/16 0435  NA 139 138  K 3.3* 3.6  CL 98* 98*  CO2 30 31  GLUCOSE 89 100*  BUN 16 12  CREATININE 0.88 0.87  CALCIUM 8.6* 8.5*     Liver Panel  No results for input(s): PROT, ALBUMIN, AST, ALT, ALKPHOS, BILITOT, BILIDIR, IBILI in the last 72 hours.     Sedimentation Rate  Recent Labs  06/27/16 0359  ESRSEDRATE 120*   C-Reactive Protein  Recent Labs  06/27/16 0359  CRP 18.7*    Micro Results: Recent Results (from the past 720 hour(s))  Blood Culture (routine x 2)     Status: None   Collection Time: 06/05/16  9:35 AM  Result Value Ref Range Status   Specimen Description BLOOD LEFT ARM  Final   Special Requests   Final    BOTTLES DRAWN AEROBIC AND ANAEROBIC Blood Culture adequate volume   Culture   Final  NO GROWTH 5 DAYS Performed at [facility], [address] [place], [place] [address]    Report Status 06/10/2016 FINAL  Final  Blood Culture (routine x 2)     Status: None   Collection Time: 06/05/16 10:20 AM  Result Value Ref Range Status   Specimen Description BLOOD LEFT ARM  Final   Special Requests   Final    BOTTLES DRAWN AEROBIC AND ANAEROBIC Blood Culture adequate volume   Culture   Final    NO GROWTH 5 DAYS Performed at [facility], [address] [place], [place] [address]    Report Status 06/10/2016 FINAL  Final  Surgical pcr screen     Status: None   Collection Time: 06/09/16  1:07 AM  Result Value Ref Range Status   MRSA, PCR NEGATIVE NEGATIVE Final   Staphylococcus aureus NEGATIVE NEGATIVE Final    Comment:        The Xpert SA Assay (FDA approved for NASAL specimens in patients over 121 years of age), is one component of a comprehensive surveillance program.  Test performance has been validated by [facility] for patients  greater than or equal to 63 year old. It is not intended to diagnose infection nor to guide or monitor treatment.   Anaerobic culture     Status: None   Collection Time: 06/09/16  8:13 AM  Result Value Ref Range Status   Specimen Description WOUND LEFT THIGH PROXIMAL  Final   Special Requests NONE  Final   Gram Stain   Final    FEW WBC PRESENT,BOTH PMN AND MONONUCLEAR NO ORGANISMS SEEN    Culture   Final    NO ANAEROBES ISOLATED Performed at [facility], [address] [place], [place] [address]    Report Status 06/29/2016 FINAL  Final  Fungus Culture With Stain     Status: None (Preliminary result)   Collection Time: 06/09/16  8:13 AM  Result Value Ref Range Status   Fungus Stain Final report  Final    Comment: (NOTE) Performed At: [facility] [address] [place], [place] [id number] Mila HomerHancock William F MD UX:[id number]:[phone]    Fungus (Mycology) Culture PENDING  Incomplete   Fungal Source WOUND  Final    Comment: LEFT THIGH PROXIMAL   Acid Fast Smear (AFB)     Status: None   Collection Time: 06/09/16  8:13 AM  Result Value Ref Range Status   AFB Specimen Processing Concentration  Final   Acid Fast Smear Negative  Final    Comment: (NOTE) Performed At: [facility] [address] [place], [place] [id number] Mila HomerHancock William F MD VQ:[id number]:[phone]    Source (AFB) WOUND  Final    Comment: LEFT THIGH PROXIMAL   Aerobic Culture (superficial specimen)     Status: None (Preliminary result)   Collection Time: 06/09/16  8:13 AM  Result Value Ref Range Status   Specimen Description WOUND LEFT THIGH PROXIMAL  Final   Special Requests NONE  Final   Gram Stain NO WBC SEEN NO ORGANISMS SEEN   Final   Culture   Final    RARE STAPHYLOCOCCUS SPECIES (COAGULASE NEGATIVE) CONTINUING TO HOLD FOR 21 DAYS TO LOOK FOR NOCARDIA Performed at [facility], [address] [place], [place] [address]    Report Status PENDING  Incomplete  Fungus Culture Result      Status: None   Collection Time: 06/09/16  8:13 AM  Result Value Ref Range Status  Result 1 Comment  Final    Comment: (NOTE) KOH/Calcofluor preparation:  no fungus observed. Performed At: [facility] [address] [place], [place] [id number] Mila Homer MD [id number]   Acid Fast Smear (AFB)     Status: None   Collection Time: 06/09/16  8:17 AM  Result Value Ref Range Status   AFB Specimen Processing Concentration  Final   Acid Fast Smear Negative  Final    Comment: (NOTE) Performed At: [facility] [address] [place], [place] [id number] Mila Homer MD QI:[id number]    Source (AFB) WOUND  Final    Comment: LEFT THIGH MEDIAL   Aerobic/Anaerobic Culture (surgical/deep wound)     Status: None (Preliminary result)   Collection Time: 06/09/16  8:17 AM  Result Value Ref Range Status   Specimen Description WOUND LEFT THIGH MEDIAL  Final   Special Requests Normal  Final   Gram Stain   Final    FEW WBC PRESENT,BOTH PMN AND MONONUCLEAR NO ORGANISMS SEEN    Culture   Final    RARE STAPHYLOCOCCUS SPECIES (COAGULASE NEGATIVE) CONTINUING TO HOLD FOR 21 DAYS TO LOOK FOR NOCARDIA NO ANAEROBES ISOLATED Performed at [facility], [address] [place], [place] [address]    Report Status PENDING  Incomplete   Organism ID, Bacteria STAPHYLOCOCCUS SPECIES (COAGULASE NEGATIVE)  Final      Susceptibility   Staphylococcus species (coagulase negative) - MIC*    CIPROFLOXACIN 4 RESISTANT Resistant     ERYTHROMYCIN >=8 RESISTANT Resistant     GENTAMICIN <=0.5 SENSITIVE Sensitive     OXACILLIN >=4 RESISTANT Resistant     TETRACYCLINE <=1 SENSITIVE Sensitive     VANCOMYCIN 1 SENSITIVE Sensitive     TRIMETH/SULFA <=10 SENSITIVE Sensitive     CLINDAMYCIN >=8 RESISTANT Resistant     RIFAMPIN <=0.5 SENSITIVE Sensitive     Inducible Clindamycin NEGATIVE Sensitive     * RARE STAPHYLOCOCCUS SPECIES (COAGULASE NEGATIVE)  Fungus Culture With Stain      Status: None (Preliminary result)   Collection Time: 06/09/16  8:17 AM  Result Value Ref Range Status   Fungus Stain Final report  Final    Comment: (NOTE) Performed At: [facility] [address] [place], [place] [id number] Mila Homer MD [id number]:[id number]    Fungus (Mycology) Culture PENDING  Incomplete   Fungal Source WOUND  Final    Comment: LEFT THIGH MEDIAL Performed at [facility], [address], [place], [place] [address]   Fungus Culture Result     Status: None   Collection Time: 06/09/16  8:17 AM  Result Value Ref Range Status   Result 1 Comment  Final    Comment: (NOTE) KOH/Calcofluor preparation:  no fungus observed. Performed At: [facility] [address] [place], [place] [id number] Mila Homer MD YH:[id number]   Acid Fast Smear (AFB)     Status: None   Collection Time: 06/09/16  1:27 PM  Result Value Ref Range Status   AFB Specimen Processing Comment  Final    Comment: Tissue Grinding and Digestion/Decontamination   Acid Fast Smear Negative  Final    Comment: (NOTE) Performed At: [facility] [address] [place], [place] [id number] Mila Homer MD XT:[id number]    Source (AFB) LEFT  Final    Comment: LEG  Acid Fast Smear (AFB)     Status: None   Collection Time: 06/09/16  1:28 PM  Result Value Ref Range Status  AFB Specimen Processing Comment  Final    Comment: Tissue Grinding and Digestion/Decontamination   Acid Fast Smear Negative  Final    Comment: (NOTE) Performed At: [facility] [address] [place], [place] [id number] Mila Homer MD WU:[id number]    Source (AFB) LEFT  Final    Comment: LEG  Acid Fast Smear (AFB)     Status: None   Collection Time: 06/09/16  1:28 PM  Result Value Ref Range Status   AFB Specimen Processing Comment  Final    Comment: Tissue Grinding and Digestion/Decontamination   Acid Fast Smear Negative  Final    Comment: (NOTE) Performed At: [facility] [address] [place], [place] [id number] Mila Homer MD VH:[id number]    Source (AFB) LEFT  Final    Comment: LEG  Surgical pcr screen     Status: None   Collection Time: 06/27/16  1:10 PM  Result Value Ref Range Status   MRSA, PCR NEGATIVE NEGATIVE Final   Staphylococcus aureus NEGATIVE NEGATIVE Final    Comment:        The Xpert SA Assay (FDA approved for NASAL specimens in patients over 15 years of age), is one component of a comprehensive surveillance program.  Test performance has been validated by [facility] for patients greater than or equal to 74 year old. It is not intended to diagnose infection nor to guide or monitor treatment.     Studies/Results: No results found.    Assessment/Plan:  INTERVAL HISTORY:  Sp repeat surgery by CCS for AFB and fungal cultures  Patient does not want to pay for parenteral therapy including cefoxitin at 61 dollars per day but wiling down the road to do so   Principal Problem:   Cellulitis of left lower extremity Active Problems:   HTN (hypertension)    RAMESHA POSTER is a 61 y.o. 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   Patient declined IV therapy. She states she needs to go back to work first and make money and then when she knows for sure what the organism is and what therapy will work she will be willing  UNDER THESE CIRCUMSTANCES I recommended   biaxin 500mg  bid  zyvox 600mg  po bid  NOTE zyvox is only active vs 20% of M abscessus species and with biaxin there is risk if it is the ONLY active agent that M abscessus will have induced MACROLIDE R and we will lose this drug  She will need CBC and CMP within 10 days of DC  I will arrange for followup in our clinic   I spent greater than 35 minutes with the patient including greater  than 50% of time in face to face counsel of the patient and in coordination of her care.  Greatly appreciate CM, CCS, WOC and primary team as well as Pharmacy.  I will sign off for now.  Please call with further questions.     LOS: 3 days   Acey Lav 06/29/2016, 5:48 PM

## 2016-06-29 NOTE — Progress Notes (Addendum)
Patient ID: Rita Watkins, female   DOB: 04/21/1955, 61 y.o.   MRN: [id number]    PROGRESS NOTE    Rita FieldingSally J Watkins  WUJ:[id number] DOB: 04/21/1955 DOA: 06/26/2016  PCP: Esperanza RichtersSaguier, Edward, PA-C   Brief Narrative:  Pt is 61 yo female with known HTN, presented to ED on 5/21 with new necrotic like lesion on left lower extremity. 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 Surgery team has done biopsy for further evaluation.   Assessment & Plan:  Principal Problem:   Cellulitis of left lower extremity - lymphangitis spread of an infection fron tow to an ankle to thigh - Status post excisional the bright amend of left medial thigh eschar, biopsy of the left lower leg skin and soft tissue wound, biopsy of left posterior lower leg skin and soft tissue wound, 06/27/2016 by Dr. Doylene Canardonner - Postop day 2, patient overall improving - Cultures pending - Damp to dry dressing changes twice a day recommended - Patient advised to keep left lower extremity elevated - Antibiotics per ID, appreciate recommendations  Active Problems:   Thrombocytosis - Suspected to be related to the above - Now resolved and no need for further monitoring    HTN (hypertension), essential - Blood pressure stable    Obesity - Body mass index is 36.79 kg/m.    Hypokalemia - Supplemented and within normal limits   DVT prophylaxis: Lovenox SQ Code Status: Full Family Communication: Patient at bedside  Disposition Plan: Home once antibiotic regimen resolved  Consultants:   ID  Surgery  Subjective: Patient reports feeling better. Wants to go home  Objective: Vitals:   06/28/16 0533 06/28/16 1454 06/28/16 2110 06/29/16 0527  BP: (!) 125/43  (!) 148/51 (!) 139/55  Pulse: 60 69 86 73  Resp: 16 16 16 16   Temp: 98.6 F (37 C) 98.5 F (36.9 C) 98.7 F (37.1 C) 98.4 F (36.9 C)  TempSrc: Oral Oral Oral Oral  SpO2: 96% 95% 96% 95%  Weight:      Height:          Intake/Output Summary (Last 24 hours) at 06/29/16 1228 Last data filed at 06/29/16 [id number]  Gross per 24 hour  Intake             1289 ml  Output             2350 ml  Net            -1061 ml   Filed Weights   06/26/16 1353 06/26/16 2051  Weight: 113.4 kg (250 lb) 113 kg (249 lb 1.6 oz)    Examination:  General exam: Appears calm and comfortable  Respiratory system: Clear to auscultation. Respiratory effort normal. Cardiovascular system: S1 & S2 heard, RRR. No JVD, rubs, gallops or clicks. No pedal edema. Gastrointestinal system: Abdomen is nondistended, soft and nontender.  Central nervous system: Alert and oriented. No focal neurological deficits. Extremities: bandages in place,   Data Reviewed: I have personally reviewed following labs and imaging studies  CBC:  Recent Labs Lab 06/26/16 1753 06/27/16 0359 06/28/16 0508 06/29/16 0435  WBC 13.2* 9.8 9.6 10.0  NEUTROABS 8.5*  --   --   --   HGB 10.9* 10.3* 9.2* 8.9*  HCT 33.3* 31.8* 28.4* 27.4*  MCV 89.3 89.1 89.9 89.0  PLT 430* 363 298 315   Basic Metabolic Panel:  Recent Labs Lab 06/26/16 1753 06/27/16 0359 06/27/16 1054 06/28/16 0508 06/29/16 0435  NA 137 138  --  139 138  K 3.2* 3.2*  --  3.3* 3.6  CL 99* 100*  --  98* 98*  CO2 27 28  --  30 31  GLUCOSE 105* 100*  --  89 100*  BUN 22* 16  --  16 12  CREATININE 0.99 0.76  --  0.88 0.87  CALCIUM 9.1 8.9  --  8.6* 8.5*  MG  --   --  1.8  --   --    Urine analysis:    Component Value Date/Time   COLORURINE YELLOW 06/08/2016 1244   APPEARANCEUR CLEAR 06/08/2016 1244   LABSPEC 1.020 06/08/2016 1244   PHURINE 5.0 06/08/2016 1244   GLUCOSEU NEGATIVE 06/08/2016 1244   HGBUR MODERATE (A) 06/08/2016 1244   BILIRUBINUR NEGATIVE 06/08/2016 1244   KETONESUR NEGATIVE 06/08/2016 1244   PROTEINUR NEGATIVE 06/08/2016 1244   NITRITE NEGATIVE 06/08/2016 1244   LEUKOCYTESUR SMALL (A) 06/08/2016 1244   Recent Results (from the past 240 hour(s))  Surgical pcr  screen     Status: None   Collection Time: 06/27/16  1:10 PM  Result Value Ref Range Status   MRSA, PCR NEGATIVE NEGATIVE Final   Staphylococcus aureus NEGATIVE NEGATIVE Final    Comment:        The Xpert SA Assay (FDA approved for NASAL specimens in patients over 33 years of age), is one component of a comprehensive surveillance program.  Test performance has been validated by [facility] for patients greater than or equal to 62 year old. It is not intended to diagnose infection nor to guide or monitor treatment.     Radiology Studies: No results found.  Scheduled Meds: . clarithromycin  500 mg Oral Q12H  . enoxaparin (LOVENOX) injection  40 mg Subcutaneous Q24H  . gabapentin  300 mg Oral QHS  . losartan  100 mg Oral Daily   And  . hydrochlorothiazide  12.5 mg Oral Daily  . linezolid  600 mg Oral Q12H   Continuous Infusions:   LOS: 3 days   Time spent: 20 minutes   Debbora Presto, MD [facility] Pager [phone]  If 7PM-7AM, please contact night-coverage [email or web address] Password [email or web address] 06/29/2016, 12:28 PM

## 2016-06-29 NOTE — Progress Notes (Signed)
Patient ID: Rita Watkins, female   DOB: 25-Jun-1955, 61 y.o.   MRN: [id number]  [facility] Surgery Progress Note  2 Days Post-Op  Subjective: CC- LLE soft tissue infection Sitting up in bed. States that she is ready to go home. Feels comfortable doing her own dressing changes. She is ambulating with no issues. Sore, but pain is well controlled. Tolerating diet. WBC WNL, afebrile.  Objective: Vital signs in last 24 hours: Temp:  [98.4 F (36.9 C)-98.7 F (37.1 C)] 98.4 F (36.9 C) (05/24 0527) Pulse Rate:  [69-86] 73 (05/24 0527) Resp:  [16] 16 (05/24 0527) BP: (139-148)/(51-55) 139/55 (05/24 0527) SpO2:  [95 %-96 %] 95 % (05/24 0527)    Intake/Output from previous day: 05/23 0701 - 05/24 0700 In: 1813 [P.O.:1560; I.V.:253] Out: 2650 [Urine:2650] Intake/Output this shift: No intake/output data recorded.  PE: Gen:  Alert, NAD, pleasant Pulm:  Effort normal Abd: Soft, NT/ND, +BS LLE: foot WWP with 2+ DP pulse, SILT. 2+ edema. 3 surgical wounds along anterior/medial lower leg and distal thigh, all are clean with no purulent drainage or foul odor, surrounding erythema improving     Lab Results:   Recent Labs  06/28/16 0508 06/29/16 0435  WBC 9.6 10.0  HGB 9.2* 8.9*  HCT 28.4* 27.4*  PLT 298 315   BMET  Recent Labs  06/28/16 0508 06/29/16 0435  NA 139 138  K 3.3* 3.6  CL 98* 98*  CO2 30 31  GLUCOSE 89 100*  BUN 16 12  CREATININE 0.88 0.87  CALCIUM 8.6* 8.5*   PT/INR No results for input(s): LABPROT, INR in the last 72 hours. CMP     Component Value Date/Time   NA 138 06/29/2016 0435   K 3.6 06/29/2016 0435   CL 98 (L) 06/29/2016 0435   CO2 31 06/29/2016 0435   GLUCOSE 100 (H) 06/29/2016 0435   BUN 12 06/29/2016 0435   CREATININE 0.87 06/29/2016 0435   CREATININE 0.77 07/24/2014 1443   CALCIUM 8.5 (L) 06/29/2016 0435   PROT 8.0 06/05/2016 0939   ALBUMIN 3.4 (L) 06/05/2016 0939   AST 13 (L) 06/05/2016 0939   ALT 11 (L) 06/05/2016 0939   ALKPHOS 82 06/05/2016 0939   BILITOT 0.6 06/05/2016 0939   GFRNONAA >60 06/29/2016 0435   GFRNONAA >89 01/17/2013 1032   GFRAA >60 06/29/2016 0435   GFRAA >89 01/17/2013 1032   Lipase  No results found for: LIPASE     Studies/Results: No results found.  Anti-infectives: Anti-infectives    Start     Dose/Rate Route Frequency Ordered Stop   06/27/16 2200  clarithromycin (BIAXIN) tablet 500 mg     500 mg Oral Every 12 hours 06/27/16 1744     06/27/16 1830  sodium chloride 0.9 % 250 mL with cefOXitin (MEFOXIN) 12 g infusion     11 mL/hr  Intravenous Continuous 06/27/16 1806         Assessment/Plan Chronic soft tissue infection S/p Excisional debridement of left medial thigh eschar (wound 11 x 6 x 1.5cm); Biopsy of left lower leg skin and soft tissue (wound 2 x 2 x 2cm), biopsy of left posterior lower leg skin and soft tissue (wound 2 x 2 x 2cm) 5/22 Dr. Fredricka Bonine - POD 2 - cultures pending - damp to dry dressing changes BID - elevate LLE for swelling  HTN Hypokalemia - resolved  ID - mefoxin 5/22>>, biaxin 5/22>> VTE - SCDs, lovenox FEN - carb modified diet  Plan - Wound stable. Continue  BID damp to dry dressing changes. Antibiotics per ID. Follow cultures. General surgery will continue to follow peripherally; when ready for discharge wound care instructions and follow-up are in AVS.   LOS: 3 days    [name] , [facility] 06/29/2016, 7:51 AM Pager: [phone] Consults: [phone] Mon-Fri 7:00 am-4:30 pm Sat-Sun 7:00 am-11:30 am

## 2016-06-30 ENCOUNTER — Telehealth: Payer: Self-pay | Admitting: *Deleted

## 2016-06-30 LAB — ACID FAST SMEAR (AFB, MYCOBACTERIA)
Acid Fast Smear: NEGATIVE
Acid Fast Smear: NEGATIVE

## 2016-06-30 LAB — AEROBIC/ANAEROBIC CULTURE W GRAM STAIN (SURGICAL/DEEP WOUND)

## 2016-06-30 LAB — AEROBIC CULTURE W GRAM STAIN (SUPERFICIAL SPECIMEN)

## 2016-06-30 LAB — AEROBIC CULTURE  (SUPERFICIAL SPECIMEN): GRAM STAIN: NONE SEEN

## 2016-06-30 LAB — ACID FAST SMEAR (AFB): ACID FAST SMEAR - AFSCU2: NEGATIVE

## 2016-06-30 LAB — AEROBIC/ANAEROBIC CULTURE (SURGICAL/DEEP WOUND): SPECIAL REQUESTS: NORMAL

## 2016-06-30 MED ORDER — CLARITHROMYCIN 500 MG PO TABS: 500.0000 mg | ORAL_TABLET | Freq: Two times a day (BID) | ORAL | 0 refills | Status: DC

## 2016-06-30 MED ORDER — LINEZOLID 600 MG PO TABS: 600.0000 mg | ORAL_TABLET | Freq: Two times a day (BID) | ORAL | 0 refills | Status: DC

## 2016-06-30 MED ORDER — OXYCODONE HCL 5 MG PO TABS: 5.0000 mg | ORAL_TABLET | Freq: Four times a day (QID) | ORAL | 0 refills | Status: DC | PRN

## 2016-06-30 MED ORDER — ALPRAZOLAM 0.5 MG PO TABS: 0.5000 mg | ORAL_TABLET | Freq: Two times a day (BID) | ORAL | 0 refills | Status: DC | PRN

## 2016-06-30 MED FILL — oxyCODONE HCL 5 MG TABS: 5 | 5 days supply | Qty: 20 | Fill #0

## 2016-06-30 NOTE — Telephone Encounter (Signed)
No problem I did speak with the patient . I said OK to temporarily take meds through early next week ie biaxin monotherapy

## 2016-06-30 NOTE — Progress Notes (Signed)
S: No acute events.   Vitals, labs, intake/output, and orders reviewed at this time.  Gen: A&Ox3, no distress  H&N: EOMI, atraumatic, neck supple Chest: unlabored respirations, RRR Abd: soft, nontender, nondistended Ext: warm, LLE bandage in place, edema slightly better Neuro: grossly normal  Lines/tubes/drains: PIV  A/P:  POD 3 debridement of left thigh wound and soft tissue biopsies for cultue Continue twice daily damp to dry dressing changes.  ABX per Dr. Daiva EvesVan Dam  General surgery will sign off. Wound care instructions and Rx for pain meds in chart. Please contact with questions or concerns at any time.    Phylliss Blakeshelsea Cayle Cordoba, MD [facility] [place] Pager [phone]

## 2016-06-30 NOTE — Telephone Encounter (Signed)
Thank you Jackie

## 2016-06-30 NOTE — Telephone Encounter (Signed)
Patient requesting call from Dr. Daiva EvesVan Dam at # [phone]. She was just discharged from hospital and [facility] Zyvox with a coupon that she was given. Pharmacist said that only works if she has insurance and she does not. The cost is $10,000 and she said, "that is not happening". Please call patient with possible alternative. Thanks, Wendall MolaJacqueline Cockerham

## 2016-06-30 NOTE — Telephone Encounter (Signed)
Explained that we will have to try to work on this on Tuesday with ID pharmacy  I am not sure how we are going to be able to solve her problem without her paying out of pocket for something.  Really her best option would be to buya ticket and [facility] (which we could probably arrange and there her meds including IV, oral and surgeries etc would all be free but twhen I proposed this she refused

## 2016-06-30 NOTE — Progress Notes (Signed)
Discharge instructions reviewed with patient, pt verbalized understanding.    Maguadalupe Lata RN

## 2016-06-30 NOTE — Discharge Summary (Signed)
Physician Discharge Summary  Rita Watkins ZOX:[id number] DOB: [birth year] DOA: 06/26/2016  PCP: Esperanza Richters, PA-C  Admit date: 06/26/2016 Discharge date: 06/30/2016  Recommendations for Outpatient Follow-up:  1. Pt will need to follow up with PCP in 1-2 weeks post discharge 2. Please obtain BMP to evaluate electrolytes and kidney function 3. Please also check CBC to evaluate Hg and Hct levels 4. ID team will set up an appointment for pt  5. Please note that biopsy report is pending on discharge  6. ID team has in the mean time recommended PO ABX with Biaxin and Zyvox as pt has declined PICC line placement that was initially recommended due to cost 7. Pt given script for one month with plan to have ID follow up and provide additional script for ABX as clinically indicated   Discharge Diagnoses:  Principal Problem:   Cellulitis of left lower extremity Active Problems:   HTN (hypertension)  Discharge Condition: Stable  Diet recommendation: Heart healthy diet discussed in details   Brief Narrative:  Pt is 61 yo female with known HTN, presented to ED on 5/21 with new necrotic like lesion on left lower extremity. 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 Surgery team has done biopsy for further evaluation.   Assessment & Plan:  Principal Problem: Cellulitis of left lower extremity - lymphangitis spread of an infection fron tow to an ankle to thigh - Status post excisional the bright amend of left medial thigh eschar, biopsy of the left lower leg skin and soft tissue wound, biopsy of left posterior lower leg skin and soft tissue wound, 06/27/2016 by Dr. Doylene Canard - Postop day 3 - pt says she feels better, she has been educated on wound care and dressing changes by surgery team  - biopsy cultures pending at the time of discharge  - recommend damp to dry dressing changes BID - pt has declined PICC line mostly for financial  reason, says that once biopsy report available and if absolutely needed  Active Problems: Thrombocytosis - resolved   HTN (hypertension), essential - remains stable     Obesity - Body mass index is 36.79 kg/m.    Hypokalemia - WNL this AM   DVT prophylaxis: Lovenox SQ Code Status: Full Family Communication: Patient at bedside  Disposition Plan: Home   Consultants:   ID  Surgery  Procedures/Studies: Ct Tibia Fibula Left W Contrast  Result Date: 06/07/2016 CLINICAL DATA:  Dropped a 5 gallon water jug on left foot in Jan. Trouble with foot and leg since. EXAM: CT OF THE LOWER LEFT TIBIA AND FIBULA WITH CONTRAST CT OF THE LOWER LEFT FOOT WITH CONTRAST TECHNIQUE: Multidetector CT imaging of the lower left tibia and fibula was performed according to the standard protocol following intravenous contrast administration. Multidetector CT imaging of the lower left foot was performed according to the standard protocol following intravenous contrast administration. COMPARISON:  None. CONTRAST:  ISOVUE-300 IOPAMIDOL (ISOVUE-300) INJECTION 61%, ISOVUE-300 IOPAMIDOL (ISOVUE-300) INJECTION 61% FINDINGS: Bones/Joint/Cartilage No acute fracture or dislocation of the left tibia or fibula. No acute fracture or dislocation of the left foot. Hallux valgus of the left foot. Prior osteotomies of the second and fifth metacarpal necks. Prior osteotomy of the calcaneus. Normal alignment. No joint effusion. 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 Subchondral sclerosis and cystic changes in the medial femoral condyle and medial tibial plateau. Tricompartmental marginal osteophytes. Moderate  osteoarthritis of the tibiotalar joint. Intact ankle mortise. Mild osteoarthritis of the first MTP joint. Mild osteoarthritis of the first IP joint. Mild osteoarthritis of the second, third and fourth PIP  and DIP joint. No aggressive lytic or sclerotic osseous lesion. Tiny plantar calcaneal spur. Ligaments Ligaments are suboptimally evaluated by CT. Muscles and Tendons The muscles are normal. No muscle atrophy. No intramuscular hematoma or fluid collection. Soft tissue 5.1 x 2.4 cm fat density mass in the anterior subcutaneous fat of the proximal left lower leg with an adjacent coarse calcification likely reflecting an area of fat necrosis versus less likely lipoma. Mild soft tissue edema in the right lower leg and foot. No focal fluid collection or hematoma. IMPRESSION: 1.  No acute osseous injury of the left tibia or fibula. 2.  No acute osseous injury of the left foot. 3. No soft tissue hematoma. 4. 5.1 x 2.4 cm fat density mass in the anterior subcutaneous fat of the proximal left lower leg with an adjacent coarse calcification likely reflecting an area of fat necrosis versus less likely lipoma. 5. 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 6. 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 Electronically Signed   By: Elige Ko   On: 06/07/2016 09:43   Ct Foot Left W Contrast  Result Date: 06/07/2016 CLINICAL DATA:  Dropped a 5 gallon water jug on left foot in Jan. Trouble with foot and leg since. EXAM: CT OF THE LOWER LEFT TIBIA AND FIBULA WITH CONTRAST CT OF THE LOWER LEFT FOOT WITH CONTRAST TECHNIQUE: Multidetector CT imaging of the lower left tibia and fibula was performed according to the standard protocol following intravenous contrast administration. Multidetector CT imaging of the lower left foot was performed according to the standard protocol following intravenous contrast administration. COMPARISON:  None. CONTRAST:  ISOVUE-300 IOPAMIDOL (ISOVUE-300) INJECTION 61%, ISOVUE-300 IOPAMIDOL (ISOVUE-300) INJECTION 61% FINDINGS: Bones/Joint/Cartilage No acute fracture or dislocation of the left tibia or fibula. No acute fracture or dislocation of the left foot. Hallux valgus  of the left foot. Prior osteotomies of the second and fifth metacarpal necks. Prior osteotomy of the calcaneus. Normal alignment. No joint effusion. 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 Subchondral sclerosis and cystic changes in the medial femoral condyle and medial tibial plateau. Tricompartmental marginal osteophytes. Moderate osteoarthritis of the tibiotalar joint. Intact ankle mortise. Mild osteoarthritis of the first MTP joint. Mild osteoarthritis of the first IP joint. Mild osteoarthritis of the second, third and fourth PIP and DIP joint. No aggressive lytic or sclerotic osseous lesion. Tiny plantar calcaneal spur. Ligaments Ligaments are suboptimally evaluated by CT. Muscles and Tendons The muscles are normal. No muscle atrophy. No intramuscular hematoma or fluid collection. Soft tissue 5.1 x 2.4 cm fat density mass in the anterior subcutaneous fat of the proximal left lower leg with an adjacent coarse calcification likely reflecting an area of fat necrosis versus less likely lipoma. Mild soft tissue edema in the right lower leg and foot. No focal fluid collection or hematoma. IMPRESSION: 1.  No acute osseous injury of the left tibia or fibula. 2.  No acute osseous injury of the left foot. 3. No soft tissue hematoma. 4. 5.1 x 2.4 cm fat density mass in the anterior subcutaneous fat of the proximal left lower leg with an adjacent coarse calcification likely reflecting an area of fat necrosis versus less likely lipoma. 5. 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 6. 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 Electronically Signed   By: Elige KoHetal  Patel   On: 06/07/2016 09:43   [place] Venous Img Lower Unilateral Left  Result Date: 06/05/2016 CLINICAL DATA:  Left lower leg pain, erythema and swelling for 2 weeks. History of left foot infection. EXAM: LEFT LOWER EXTREMITY VENOUS DOPPLER  ULTRASOUND TECHNIQUE: Gray-scale sonography with graded compression, as well as color Doppler and duplex ultrasound were performed to evaluate the lower extremity deep venous systems from the level of the common femoral vein and including the common femoral, femoral, profunda femoral, popliteal and calf veins including the posterior tibial, peroneal and gastrocnemius veins when visible. The superficial great saphenous vein was also interrogated. Spectral Doppler was utilized to evaluate flow at rest and with distal augmentation maneuvers in the common femoral, femoral and popliteal veins. COMPARISON:  Foot radiographs 03/31/2016. FINDINGS: Contralateral Common Femoral Vein: Respiratory phasicity is normal and symmetric with the symptomatic side. No evidence of thrombus. Normal compressibility. Common Femoral Vein: No evidence of thrombus. Normal compressibility, respiratory phasicity and response to augmentation. Saphenofemoral Junction: No evidence of thrombus. Normal compressibility and flow on color Doppler imaging. Profunda Femoral Vein: No evidence of thrombus. Normal compressibility and flow on color Doppler imaging. Femoral Vein: No evidence of thrombus. Normal compressibility, respiratory phasicity and response to augmentation. Popliteal Vein: No evidence of thrombus. Normal compressibility, respiratory phasicity and response to augmentation. Calf Veins: Suboptimally visualized.  No evidence of thrombus. Superficial Great Saphenous Vein: No evidence of thrombus. Normal compressibility and flow on color Doppler imaging. Other Findings: Assessment for augmentation was mildly limited by patient pain. Soft tissue edema noted in the lower leg. IMPRESSION: 1. No evidence of left lower extremity DVT. 2. Lower leg edema.  Mild limitations as above. Electronically Signed   By: Carey BullocksWilliam  Veazey M.D.   On: 06/05/2016 11:21    Discharge Exam: Vitals:   06/29/16 2041 06/30/16 0523  BP: (!) 130/56 (!) 144/56  Pulse:  69 77  Resp: 18 16  Temp: 98.5 F (36.9 C) 98.7 F (37.1 C)   Vitals:   06/29/16 0527 06/29/16 1410 06/29/16 2041 06/30/16 0523  BP: (!) 139/55 (!) 144/58 (!) 130/56 (!) 144/56  Pulse: 73 91 69 77  Resp: 16 18 18 16   Temp: 98.4 F (36.9 C) 99.1 F (37.3 C) 98.5 F (36.9 C) 98.7 F (37.1 C)  TempSrc: Oral Oral Oral Oral  SpO2: 95% 96% 99% 94%  Weight:      Height:        General: Pt is alert, follows commands appropriately, not in acute distress Cardiovascular: Regular rate and rhythm, S1/S2 +, no murmurs, no rubs, no gallops Respiratory: Clear to auscultation bilaterally, no wheezing, no crackles, no rhonchi Abdominal: Soft, non tender, non distended, bowel sounds +, no guarding  Discharge Instructions  Discharge Instructions    Diet - low sodium heart healthy    Complete by:  As directed    Increase activity slowly    Complete by:  As directed      Allergies as of 06/30/2016      Reactions   Avelox [moxifloxacin Hcl In Nacl] Anaphylaxis   Terbinafine Hcl Swelling   Zocor [simvastatin - High Dose] Anaphylaxis   Montelukast Sodium Other (See Comments)   Insomnia   Pravastatin Other (See Comments)   Myalgias on 20mg       Medication List    STOP taking these medications   HYDROcodone-acetaminophen 5-325 MG tablet Commonly known as:  NORCO/VICODIN  TAKE these medications   acetaminophen 325 MG tablet Commonly known as:  TYLENOL Take 650 mg by mouth every 6 (six) hours as needed.   ALPRAZolam 0.5 MG tablet Commonly known as:  XANAX Take 1 tablet (0.5 mg total) by mouth 2 (two) times daily as needed for anxiety.   clarithromycin 500 MG tablet Commonly known as:  BIAXIN Take 1 tablet (500 mg total) by mouth every 12 (twelve) hours.   gabapentin 300 MG capsule Commonly known as:  NEURONTIN Take 1 capsule (300 mg total) by mouth at bedtime.   ibuprofen 200 MG tablet Commonly known as:  ADVIL,MOTRIN Take 800 mg by mouth every 6 (six) hours as needed.    linezolid 600 MG tablet Commonly known as:  ZYVOX Take 1 tablet (600 mg total) by mouth every 12 (twelve) hours.   losartan-hydrochlorothiazide 100-12.5 MG tablet Commonly known as:  HYZAAR Take 1 tablet by mouth daily.   oxyCODONE 5 MG immediate release tablet Commonly known as:  Oxy IR/ROXICODONE Take 1 tablet (5 mg total) by mouth every 6 (six) hours as needed for severe pain or breakthrough pain.   vitamin C 500 MG tablet Commonly known as:  ASCORBIC ACID Take 500 mg by mouth daily.      Follow-up Information    Berna Bue, MD. Call in 4 week(s).   Specialty:  General Surgery Why:  Call as soon as you leave the hospital to make a follow-up appointment from your surgery in about 4 weeks. Contact information: [phone]        Saguier, Ramon Dredge, PA-C Follow up.   Specialties:  Internal Medicine, Family Medicine Contact information: [address] [place] [address] [phone]        Daiva Eves, Lisette Grinder, MD. Schedule an appointment as soon as possible for a visit.   Specialty:  Infectious Diseases Contact information: [address] [place] [address] [phone]            The results of significant diagnostics from this hospitalization (including imaging, microbiology, ancillary and laboratory) are listed below for reference.     Microbiology: Recent Results (from the past 240 hour(s))  Surgical pcr screen     Status: None   Collection Time: 06/27/16  1:10 PM  Result Value Ref Range Status   MRSA, PCR NEGATIVE NEGATIVE Final   Staphylococcus aureus NEGATIVE NEGATIVE Final    Comment:        The Xpert SA Assay (FDA approved for NASAL specimens in patients over 18 years of age), is one component of a comprehensive surveillance program.  Test performance has been validated by [facility] for patients greater than or equal to 34 year old. It is not intended to diagnose infection nor to guide or monitor  treatment.      Labs: Basic Metabolic Panel:  Recent Labs Lab 06/26/16 1753 06/27/16 0359 06/27/16 1054 06/28/16 0508 06/29/16 0435  NA 137 138  --  139 138  K 3.2* 3.2*  --  3.3* 3.6  CL 99* 100*  --  98* 98*  CO2 27 28  --  30 31  GLUCOSE 105* 100*  --  89 100*  BUN 22* 16  --  16 12  CREATININE 0.99 0.76  --  0.88 0.87  CALCIUM 9.1 8.9  --  8.6* 8.5*  MG  --   --  1.8  --   --    CBC:  Recent Labs Lab 06/26/16 1753 06/27/16  6578 06/28/16 0508 06/29/16 0435  WBC 13.2* 9.8 9.6 10.0  NEUTROABS 8.5*  --   --   --   HGB 10.9* 10.3* 9.2* 8.9*  HCT 33.3* 31.8* 28.4* 27.4*  MCV 89.3 89.1 89.9 89.0  PLT 430* 363 298 315   SIGNED: Time coordinating discharge: 30 minutes  Debbora Presto, MD  [facility] 06/30/2016, 11:11 AM Pager [phone]  If 7PM-7AM, please contact night-coverage [email or web address] Password [email or web address]

## 2016-07-04 ENCOUNTER — Telehealth: Payer: Self-pay | Admitting: *Deleted

## 2016-07-04 NOTE — Telephone Encounter (Signed)
Yes that would be great. This is such a tough case!

## 2016-07-04 NOTE — Telephone Encounter (Signed)
Should I start working on getting Linezolid for her?

## 2016-07-04 NOTE — Telephone Encounter (Signed)
Excellent

## 2016-07-04 NOTE — Telephone Encounter (Signed)
THanks so much Cassie!

## 2016-07-04 NOTE — Telephone Encounter (Signed)
Patient called to inform Dr. Daiva EvesVan Dam that she was able to "secure" the Zyvox and she is currently taking both antibiotics that were prescribed for her. Wendall MolaJacqueline Quenna Doepke

## 2016-07-04 NOTE — Telephone Encounter (Signed)
Will do.  I know! Wish we had a clear picture. Will start application for linezolid.

## 2016-07-05 ENCOUNTER — Other Ambulatory Visit: Payer: Self-pay | Admitting: Pharmacist

## 2016-07-05 DIAGNOSIS — L03116 Cellulitis of left lower limb: Secondary | ICD-10-CM

## 2016-07-05 MED ORDER — LINEZOLID 600 MG PO TABS: 600.0000 mg | ORAL_TABLET | Freq: Two times a day (BID) | ORAL | 3 refills | Status: DC

## 2016-07-07 ENCOUNTER — Encounter (HOSPITAL_COMMUNITY): Payer: Self-pay

## 2016-07-07 NOTE — Addendum Note (Signed)
Addendum  created 07/07/16 1237 by Hailyn Zarr D, MD   Sign clinical note    

## 2016-07-08 NOTE — Addendum Note (Signed)
Addendum  created 07/08/16 0921 by Werner Labella, MD   Sign clinical note    

## 2016-07-13 ENCOUNTER — Telehealth: Payer: Self-pay | Admitting: Infectious Diseases

## 2016-07-13 NOTE — Telephone Encounter (Signed)
Pt called worried about adr.  She is on zyvox, biaxin for non-tubercuous mycobacteria infection.  States she is "very scared and worried"  Has not been able to work or drive- has had difficulty focusing.  Has eaten very little. Has n/v twice. Has no appetite.  No fevers, has had chills,. I asked her to stop her anbx, call back from ID in AM I asked if she wanted to be seen in ED and she did not want to.  I asked her to call me overnight if she does not feel well.

## 2016-07-14 ENCOUNTER — Telehealth: Payer: Self-pay

## 2016-07-14 DIAGNOSIS — R110 Nausea: Secondary | ICD-10-CM

## 2016-07-14 MED ORDER — ONDANSETRON HCL 4 MG PO TABS: 4.0000 mg | ORAL_TABLET | Freq: Three times a day (TID) | ORAL | 0 refills | Status: DC | PRN

## 2016-07-14 NOTE — Telephone Encounter (Signed)
I will give her a call. She is a very difficult case. I think in this context stopping is best thing. If it is NTM esp M abscessus it will be back. We could see if zofran or phenergan might help but I suspect working is priority number 1 for her right now. She will have recurrence and we will start over then I expect

## 2016-07-14 NOTE — Telephone Encounter (Signed)
Patient states Dr Daiva EvesVan Dam left a message on her voicemail stating he would call medicaiton into pharmacy for nausea.  She checked with her pharmacy and there was no medicaiton.   I spoke with Dr Daiva EvesVan Dam and given verbal order :  Zofran 4 mgs one four times a day # 60 tablets.  Verbal order ran back and verified.   I will call to pharmacy.   Laurell Josephsammy K Jizel Cheeks, RN

## 2016-07-15 LAB — FUNGUS CULTURE RESULT

## 2016-07-15 LAB — FUNGUS CULTURE WITH STAIN

## 2016-07-15 LAB — FUNGAL ORGANISM REFLEX

## 2016-07-17 NOTE — Telephone Encounter (Signed)
zofran has been called in

## 2016-07-18 ENCOUNTER — Telehealth: Payer: Self-pay | Admitting: Podiatry

## 2016-07-18 NOTE — Telephone Encounter (Signed)
Thank you :)

## 2016-07-18 NOTE — Telephone Encounter (Signed)
lvm per Dr Ardelle AntonWagoner to see if pt would like to schedule an appt to follow up with him after she was in the hospital.

## 2016-07-19 ENCOUNTER — Ambulatory Visit (INDEPENDENT_AMBULATORY_CARE_PROVIDER_SITE_OTHER): Payer: Self-pay | Admitting: Infectious Disease

## 2016-07-19 ENCOUNTER — Encounter: Payer: Self-pay | Admitting: Infectious Disease

## 2016-07-19 VITALS — BP 106/65 | HR 76 | Temp 98.1°F | Ht 69.0 in

## 2016-07-19 DIAGNOSIS — A319 Mycobacterial infection, unspecified: Secondary | ICD-10-CM

## 2016-07-19 DIAGNOSIS — R42 Dizziness and giddiness: Secondary | ICD-10-CM

## 2016-07-19 DIAGNOSIS — L0291 Cutaneous abscess, unspecified: Secondary | ICD-10-CM

## 2016-07-19 DIAGNOSIS — F17210 Nicotine dependence, cigarettes, uncomplicated: Secondary | ICD-10-CM

## 2016-07-19 DIAGNOSIS — I891 Lymphangitis: Secondary | ICD-10-CM

## 2016-07-19 DIAGNOSIS — R110 Nausea: Secondary | ICD-10-CM | POA: Insufficient documentation

## 2016-07-19 DIAGNOSIS — I96 Gangrene, not elsewhere classified: Secondary | ICD-10-CM

## 2016-07-19 DIAGNOSIS — I776 Arteritis, unspecified: Secondary | ICD-10-CM

## 2016-07-19 DIAGNOSIS — R238 Other skin changes: Secondary | ICD-10-CM

## 2016-07-19 HISTORY — DX: Gangrene, not elsewhere classified: I96

## 2016-07-19 HISTORY — DX: Nausea: R11.0

## 2016-07-19 HISTORY — DX: Dizziness and giddiness: R42

## 2016-07-19 LAB — COMPLETE METABOLIC PANEL WITH GFR
ALT: 4 U/L — ABNORMAL LOW (ref 6–29)
AST: 8 U/L — ABNORMAL LOW (ref 10–35)
Albumin: 3.2 g/dL — ABNORMAL LOW (ref 3.6–5.1)
Alkaline Phosphatase: 85 U/L (ref 33–130)
BUN: 28 mg/dL — ABNORMAL HIGH (ref 7–25)
CALCIUM: 8.5 mg/dL — AB (ref 8.6–10.4)
CHLORIDE: 98 mmol/L (ref 98–110)
CO2: 28 mmol/L (ref 20–31)
Creat: 1.4 mg/dL — ABNORMAL HIGH (ref 0.50–0.99)
GFR, EST AFRICAN AMERICAN: 46 mL/min — AB (ref >=60)
GFR, EST NON AFRICAN AMERICAN: 40 mL/min — AB (ref >=60)
Glucose, Bld: 100 mg/dL — ABNORMAL HIGH (ref 65–99)
Potassium: 4.6 mmol/L (ref 3.5–5.3)
Sodium: 136 mmol/L (ref 135–146)
Total Bilirubin: 0.5 mg/dL (ref 0.2–1.2)
Total Protein: 7.1 g/dL (ref 6.1–8.1)

## 2016-07-19 LAB — CBC WITH DIFFERENTIAL/PLATELET
BASOS PCT: 0 %
Basophils Absolute: 0 cells/uL (ref 0–200)
EOS ABS: 154 {cells}/uL (ref 15–500)
Eosinophils Relative: 1 %
HEMATOCRIT: 29.4 % — AB (ref 35.0–45.0)
Hemoglobin: 9.3 g/dL — ABNORMAL LOW (ref 11.7–15.5)
Lymphocytes Relative: 14 %
Lymphs Abs: 2156 cells/uL (ref 850–3900)
MCH: 27.8 pg (ref 27.0–33.0)
MCHC: 31.6 g/dL — ABNORMAL LOW (ref 32.0–36.0)
MCV: 87.8 fL (ref 80.0–100.0)
MONO ABS: 2156 {cells}/uL — AB (ref 200–950)
MONOS PCT: 14 %
MPV: 9.1 fL (ref 7.5–12.5)
NEUTROS ABS: 10934 {cells}/uL — AB (ref 1500–7800)
Neutrophils Relative %: 71 %
PLATELETS: 327 10*3/uL (ref 140–400)
RBC: 3.35 MIL/uL — AB (ref 3.80–5.10)
RDW: 13.4 % (ref 11.0–15.0)
WBC: 15.4 10*3/uL — AB (ref 3.8–10.8)

## 2016-07-19 MED FILL — traMADol HCL 50 MG TABS: 50 | 8 days supply | Qty: 30 | Fill #0

## 2016-07-19 NOTE — Telephone Encounter (Signed)
I have not seen her, therefore I cannot order pain medication for her. If she can come to [place] this week, I would be happy to see her before her scheduled appointment.

## 2016-07-19 NOTE — Progress Notes (Signed)
Subjective:   Chief complaint: Pain in her toe and also over her ulcers   Patient ID: Rita FieldingSally J Yellin, female    DOB: August 01, 1955, 61 y.o.   MRN: [id number]  HPI  61 y.o. female who had seen a few weeks ago [facility]. She has a history of frequenting nail salons for pedicures including just before Christmas of 2017. A few days after Xmas, shortly before New Year's Eve she dropped a large water dispensing jug of drinking water on her toe. She was wearing sandles at the time so the object directly impacted her bare feet. She did not notice any bleeding initially. HOwever she then developed blistering of the skin of her 4th toe which then scabbed. The toe became quite swollen and tender. She had been treating it with only topical antimicrobials before finally being seen by Dr. Ardelle AntonWagoner with Podiatry on February the 13th. Dr. Ardelle AntonWagoner observed ulceration of 4th toe. He obtained plain films which did not show osteo or fracture. She was given surgical shoe, loal wound care and keflex. Pt had experienced edema and was rx with lasix by PCP. Her wounds improved except for 4th digit which continued to be painful on followup visit in March with Podiatry. Pt given gabapentin for pain. Patient was then seen in late March and started on oral clindamycin. She began draining clear material from 4th digit though tenderness improved by exam in mid April. Local care and antibiotics, patient believes she was no on cephalexin again. Roughly 2 weeks prior to admission she developed area of redness on her thigh that progressed over ensuing 2 weeks with black center that expanded and area that was exquisitely tender. She was admitted to [facility] on the 30th and she was placed on vancomycin, zosyn then vancomycin, ceftriaxone and metronidazole. She had doppler's negative for DVT on admission CT-scan of tibia/fibula and foot showed: 5.1 x 2.4 cm fat density mass in the anterior subcutaneous fat o the proximal left lower leg.  I do not believe that the area in medial thigh was imaged here though.   I saw her on May the 2nd. I DC the broad spectrum abx she was on, narrowed to CTX. I asked for plastic surgery consult and the patient underwent extensive removal of tissue by Dr. Ulice Watkins in  the operating room. Unfortunately the tissue was essentially entirely sent to pathology. A few swabs were sent for culture.Dr. Ulice [facility] did perform two separate PUNCH biopsies which were sent for AFB smear and culture.  The pathologic findings were not helpful. In other words no granulomas were observed no mycobacteria fungi were observed. Had the material sent for in paraffin to pursue [place] for PCR was done for mycobacterial organisms fungal organisms and a broad PCR for bacterial pathogens. All these were negative. Note certainly this PCR technology can be compromised by fixatives such that that is those they're used to preserve the tissue for pathology.  The AFB or the smears weren't negative and cultures are negative at 2 weeks. She had worsening of her erythema around her lymphangitic areas and was readmitted to the hospital on May 21. She refused imaging to look deeper in the thigh. She was seen by general surgery who obtained deep specimens of tissue which were all sent for AFB stain and culture. No repeat specimens were sent to pathology due to concerns about not settings sufficient specimens to the microbiology lab. We attempted to construct an IV regimen to treat her with optimal  empiric coverage for possible Mycobacterium abscesses. Unfortunately however she did not qualify for advance [facility] program and she did not have insurance. Instead we sent her home on oral clarithromycin and linezolid.  AFB stains were all negative yet again care. Cultures are still not finalized some May 4 nor are they from recent specimens. She continues to have ulcers where she has had debridement and still some necrotic  appearing tissue on her shin. Her toe still is very tender to palpation and causes her great deal of distress. While being on the antibiotic. Prescribe she has been experiencing dizziness as well as nausea. The nausea has been made more bearable with prescription of Zofran. She has not had follow-up CBC done. I do think we also need to entertain the possibility that she has a noninfectious pathology such as an unusual form of pyoderma gangrenosum  Past Medical History:  Diagnosis Date  . Allergy   . Anxiety   . Asthma   . COPD (chronic obstructive pulmonary disease) (HCC)   . Depression   . Dizziness 07/19/2016  . Hyperlipidemia   . Hypertension   . Lymphangitis 06/07/2016  . 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 Nausea 07/19/2016  . Necrosis (HCC) 07/19/2016    Past Surgical History:  Procedure Laterality Date  . FOOT SURGERY     Left  . HIP PINNING     3 each hip  . IRRIGATION AND DEBRIDEMENT ABSCESS N/A 06/27/2016   Procedure: IRRIGATION AND DEBRIDEMENT LEFT LEG WITH SOTFT TISSUE BIOPSY;  Surgeon: Berna Bue, MD;  Location: [facility];  Service: General;  Laterality: N/A;  . LYMPH NODE BIOPSY Left 06/09/2016   Procedure: BIOPSY LEFT LEG;  Surgeon: Peggye Form, DO;  Location: [facility];  Service: Plastics;  Laterality: Left;  . SLIPPED CAPITAL FEMORAL EPIPHYSIS PINNING      Family History  Problem Relation Age of Onset  . Hypertension Mother   . Hyperlipidemia Mother   . Hypertension Brother   . Asthma Brother       Social History   Social History  . Marital status: Widowed    Spouse name: N/A  . Number of children: N/A  . 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 Smoking status: Current Every Day Smoker    Packs/day: 1.00    Years: 35.00    Types: Cigarettes  . Smokeless tobacco: Never Used  . Alcohol use No  . Drug use: No  . Sexual activity: No   Other Topics Concern  .  None   Social History Narrative   Does not exercise.    Allergies  Allergen Reactions  . Avelox [Moxifloxacin Hcl In Nacl] Anaphylaxis  . Terbinafine Hcl Swelling  . Zocor [Simvastatin - High Dose] Anaphylaxis  . Montelukast Sodium Other (See Comments)    Insomnia  . Pravastatin Other (See Comments)    Myalgias on 20mg      Current Outpatient Prescriptions:  .  ALPRAZolam (XANAX) 0.5 MG tablet, Take 1 tablet (0.5 mg total) by mouth 2 (two) times daily as needed for anxiety., Disp: 20 tablet, Rfl: 0 .  clarithromycin (BIAXIN) 500 MG tablet, Take 1 tablet (500 mg total) by mouth every 12 (twelve) hours., Disp: 60 tablet, Rfl: 0 .  linezolid (ZYVOX) 600 MG tablet, Take 1 tablet (600 mg total) by mouth every 12 (twelve) hours., Disp: 60 tablet, Rfl: 3 .  losartan-hydrochlorothiazide (HYZAAR) 100-12.5 MG  tablet, Take 1 tablet by mouth daily., Disp: 90 tablet, Rfl: 1 .  ondansetron (ZOFRAN) 4 MG tablet, Take 1 tablet (4 mg total) by mouth every 8 (eight) hours as needed for nausea or vomiting., Disp: 90 tablet, Rfl: 0 .  vitamin C (ASCORBIC ACID) 500 MG tablet, Take 500 mg by mouth daily., Disp: , Rfl:  .  acetaminophen (TYLENOL) 325 MG tablet, Take 650 mg by mouth every 6 (six) hours as needed., Disp: , Rfl:  .  gabapentin (NEURONTIN) 300 MG capsule, Take 1 capsule (300 mg total) by mouth at bedtime. (Patient not taking: Reported on 07/19/2016), Disp: 90 capsule, Rfl: 3 .  ibuprofen (ADVIL,MOTRIN) 200 MG tablet, Take 800 mg by mouth every 6 (six) hours as needed., Disp: , Rfl:  .  oxyCODONE (OXY IR/ROXICODONE) 5 MG immediate release tablet, Take 1 tablet (5 mg total) by mouth every 6 (six) hours as needed for severe pain or breakthrough pain. (Patient not taking: Reported on 07/19/2016), Disp: 20 tablet, Rfl: 0   Review of Systems  Constitutional: Negative for chills and fever.  HENT: Negative for congestion and sore throat.   Eyes: Negative for photophobia.  Respiratory: Negative for  cough, shortness of breath and wheezing.   Cardiovascular: Negative for chest pain, palpitations and leg swelling.  Gastrointestinal: Positive for nausea. Negative for abdominal pain, blood in stool, constipation, diarrhea and vomiting.  Genitourinary: Negative for dysuria, flank pain and hematuria.  Musculoskeletal: Positive for arthralgias and myalgias. Negative for back pain.  Skin: Positive for color change and wound. Negative for rash.  Neurological: Positive for dizziness. Negative for weakness and headaches.  Hematological: Does not bruise/bleed easily.  Psychiatric/Behavioral: Negative for suicidal ideas.       Objective:   Physical Exam  Constitutional: She is oriented to person, place, and time. She appears well-developed and well-nourished. No distress.  HENT:  Head: Normocephalic and atraumatic.  Mouth/Throat: No oropharyngeal exudate.  Eyes: Conjunctivae and EOM are normal. No scleral icterus.  Neck: Normal range of motion. Neck supple.  Cardiovascular: Normal rate and regular rhythm.   Pulmonary/Chest: Effort normal. No respiratory distress. She has no wheezes.  Abdominal: She exhibits no distension.  Musculoskeletal: She exhibits no edema or tenderness.  Neurological: She is alert and oriented to person, place, and time. She exhibits normal muscle tone. Coordination normal.  Skin: Skin is warm and dry. No rash noted. She is not diaphoretic. There is erythema. No pallor.  Psychiatric: Her behavior is normal. Judgment and thought content normal. She exhibits a depressed mood.   Wounds on leg   Leg 06/05/16:      Leg 06/07/16:       06/08/16:    Foot and toe 06/05/16:      Toe 06/07/16:       06/26/16:         Leg 06/05/16:      Leg 06/07/16:       06/08/16:      06/26/16:            07/19/16: there is increased necrosis distally             Assessment & Plan:   Unusual  progressive soft tissue pathology: I have been fairly convinced that this is mycobacterial infection though no proof of this on culture. We will check CBC c diff and CMP today. I will consider empiric course of steroids such as prednisone AS [facility] as she remains on antibiotics at the same time.  She would  benefit from dermatological expertise but lacking insurance this is a major obstacle.  Nausea: Continue Zofran.  Dizziness hopefully she has not become anemic due to bone marrow suppression in the context of Zyvox.  Necrotic toe: will need amputation if so would like it sent to PATHOLOGY and for AFB and fungal cultures  I spent greater than 40 minutes with the patient including greater than 50% of time in face to face counsel of the patient and her friend with regards to her unusual soft tissue lymphangitic pathology differential diagnosis including mycobacterial infection fungal infection and noninfectious entities such as pyoderma, her nausea or dizziness and in coordination of her care.

## 2016-07-20 LAB — C-REACTIVE PROTEIN: CRP: 228 mg/L — AB (ref <8.0)

## 2016-07-20 LAB — SEDIMENTATION RATE

## 2016-07-24 ENCOUNTER — Telehealth: Payer: Self-pay | Admitting: Medical

## 2016-07-24 ENCOUNTER — Telehealth: Payer: Self-pay | Admitting: *Deleted

## 2016-07-24 ENCOUNTER — Other Ambulatory Visit: Payer: Self-pay | Admitting: Infectious Disease

## 2016-07-24 DIAGNOSIS — I891 Lymphangitis: Secondary | ICD-10-CM

## 2016-07-24 DIAGNOSIS — A319 Mycobacterial infection, unspecified: Secondary | ICD-10-CM

## 2016-07-24 LAB — ACID FAST CULTURE WITH REFLEXED SENSITIVITIES
ACID FAST CULTURE - AFSCU3: NEGATIVE
ACID FAST CULTURE - AFSCU3: NEGATIVE
ACID FAST CULTURE - AFSCU3: NEGATIVE

## 2016-07-24 LAB — ACID FAST CULTURE WITH REFLEXED SENSITIVITIES (MYCOBACTERIA)

## 2016-07-24 MED FILL — ALPRAZolam 0.5 MG TABS: 0.5 | 30 days supply | Qty: 60 | Fill #3

## 2016-07-24 NOTE — Telephone Encounter (Signed)
Caller name:Michelle w/ Infectious Disease Relationship to patient: Can be reached: Pharmacy:  Reason for call:Dr Daiva EvesVan Dam is requesting patient to have labs here tomorrow since she has a appt upstairs with Dr Loreta AveWagner, pt has problems with transportation. Requesting BMP w/GFR, CBC Diff w/ platelets.

## 2016-07-24 NOTE — Telephone Encounter (Signed)
-----   Message from Macy MisJacqueline A Cockerham, [place] sent at 07/24/2016  9:03 AM EDT ----- Hey Dr. Daiva EvesVan Dam I am in clinic this morning; so Micael HampshireMichelle Montrae Braithwaite, RN is going to get her scheduled. Thanks, Annice PihJackie ----- Message ----- From: Daiva EvesVan Dam, Lisette Grinderornelius N, MD Sent: 07/24/2016   8:56 AM To: Macy MisJacqueline A Cockerham, CMA  I actually already saw her once for hosptial followup so she should be rescheduled with me. I would like to recheck her labs later this week with BMP w GFR and CBC w diff for lab visit ----- Message ----- From: Macy Misockerham, Jacqueline A, CMA Sent: 07/21/2016   9:18 AM To: Randall Hissornelius N Van Dam, MD  Patient notified and transferred to Samule OhmKim Marley for hospital follow up appointment. Wendall MolaJacqueline Cockerham  ----- Message ----- From: Daiva EvesVan Dam, Lisette Grinderornelius N, MD Sent: 07/20/2016   1:40 PM To: Cherylann Ratelassie L Kuppelweiser, RPH, #  Creatinine is up a bit. Lets ask her to make sure she stays hydrated, Maybe the zofran will now have helped. Her inflammatory markers are off the charts. May need to consider corticosteroids in near future

## 2016-07-24 NOTE — Telephone Encounter (Signed)
Orders placed, please contact pt to schedule lab appointment.

## 2016-07-24 NOTE — Telephone Encounter (Signed)
Spoke with patient. She will need to coordinate transportation and is not sure she can get 2 rides (1 for her podiatrist appointment Tuesday and 1 for labs later). She asked if these trips could be consolidated.  Her podiatrist's office is in the same building as her PCP, Dr Production assistant, [place].  RN spoke with Candise BowensJen at Dr Milagros ReapSaguier's office to see if the CBC Diff/platelet and BMP wGFR could be drawn there. Candise BowensJen will ask for MD orders, will call [facility] back to confirm. Andree CossHowell, Trei Schoch M, RN

## 2016-07-24 NOTE — Progress Notes (Signed)
Pt needs repeat BBMP and CBC this week

## 2016-07-24 NOTE — Telephone Encounter (Signed)
That sounds reasonable.

## 2016-07-25 ENCOUNTER — Ambulatory Visit (INDEPENDENT_AMBULATORY_CARE_PROVIDER_SITE_OTHER): Payer: Self-pay | Admitting: Podiatry

## 2016-07-25 ENCOUNTER — Other Ambulatory Visit: Payer: Self-pay

## 2016-07-25 DIAGNOSIS — L97521 Non-pressure chronic ulcer of other part of left foot limited to breakdown of skin: Secondary | ICD-10-CM

## 2016-07-25 DIAGNOSIS — I891 Lymphangitis: Secondary | ICD-10-CM

## 2016-07-25 DIAGNOSIS — I96 Gangrene, not elsewhere classified: Secondary | ICD-10-CM

## 2016-07-25 LAB — ACID FAST CULTURE WITH REFLEXED SENSITIVITIES (MYCOBACTERIA): Acid Fast Culture: NEGATIVE

## 2016-07-25 LAB — BASIC METABOLIC PANEL WITH GFR
BUN: 34 mg/dL — ABNORMAL HIGH (ref 7–25)
CO2: 26 mmol/L (ref 20–31)
Calcium: 8.6 mg/dL (ref 8.6–10.4)
Chloride: 97 mmol/L — ABNORMAL LOW (ref 98–110)
Creat: 1.48 mg/dL — ABNORMAL HIGH (ref 0.50–0.99)
GFR, EST AFRICAN AMERICAN: 43 mL/min — AB (ref >=60)
GFR, EST NON AFRICAN AMERICAN: 38 mL/min — AB (ref >=60)
Glucose, Bld: 107 mg/dL — ABNORMAL HIGH (ref 65–99)
POTASSIUM: 4.2 mmol/L (ref 3.5–5.3)
Sodium: 133 mmol/L — ABNORMAL LOW (ref 135–146)

## 2016-07-25 LAB — CBC WITH DIFFERENTIAL/PLATELET
BASOS PCT: 0 %
Basophils Absolute: 0 cells/uL (ref 0–200)
Eosinophils Absolute: 342 cells/uL (ref 15–500)
Eosinophils Relative: 2 %
HCT: 27.3 % — ABNORMAL LOW (ref 35.0–45.0)
Hemoglobin: 8.8 g/dL — ABNORMAL LOW (ref 11.7–15.5)
Lymphocytes Relative: 7 %
Lymphs Abs: 1197 cells/uL (ref 850–3900)
MCH: 27.7 pg (ref 27.0–33.0)
MCHC: 32.2 g/dL (ref 32.0–36.0)
MCV: 85.8 fL (ref 80.0–100.0)
MONO ABS: 1710 {cells}/uL — AB (ref 200–950)
MONOS PCT: 10 %
MPV: 8.7 fL (ref 7.5–12.5)
NEUTROS ABS: 13851 {cells}/uL — AB (ref 1500–7800)
Neutrophils Relative %: 81 %
PLATELETS: 401 10*3/uL — AB (ref 140–400)
RBC: 3.18 MIL/uL — AB (ref 3.80–5.10)
RDW: 13.3 % (ref 11.0–15.0)
WBC: 17.1 10*3/uL — AB (ref 3.8–10.8)

## 2016-07-25 LAB — ACID FAST CULTURE WITH REFLEXED SENSITIVITIES

## 2016-07-25 MED ORDER — OXYCODONE-ACETAMINOPHEN 5-325 MG PO TABS: 1.0000 | ORAL_TABLET | Freq: Four times a day (QID) | ORAL | 0 refills | Status: DC | PRN

## 2016-07-25 MED FILL — OXYCODONE/APAP 5/325 MG TAB: 5-325 | 4 days supply | Qty: 30 | Fill #0

## 2016-07-25 NOTE — Progress Notes (Signed)
Subjective: 61 year old female presents the office today for concerns and evaluation of left fourth toe pain as well as ulceration. She states that given the continued nonhealing of the fourth toe she wanted to go ahead and proceed with amputation of the toe. Unfortunate she has undergone significant changes in her medical history since I last saw her. She has a large necrotic wound on her left leg that she is currently being managed by Dr. Daiva EvesVan Dam. She states that she is continuing very dressing changes for leg and that Dr. Algis LimingVandam is currently treating her for this. She presents today for evaluation of the toe. Denies any systemic complaints such as fevers, chills, nausea, vomiting. No acute changes since last appointment, and no other complaints at this time.   Objective: AAO x3, NAD DP/PT pulses palpable bilaterally, CRT less than 3 seconds Necrotic area to the distal aspect of the left fourth digit without any drainage or pus expressed. There is mild edema and erythema to the digit without any ascending synovitis. There is tenderness the entire fourth digit at this time. There is no pain to the metatarsal to other areas of the foot. There is chronic bilateral lower extremity edema. Large necrotic wounds are present the left leg. She presents today with a friend to his been with her throughout this whole process and she states the areas have been looking much better but over the last couple days she is started to notice a they're getting worse. She also states the toe has been getting worse since last week. She's continue tramadol for pain. There is no pain with calf compression.  Assessment: Nonhealing ulceration left fourth digit; ? Vasculitis/pyoderma gangrenosum  Plan: -Treatment options discussed including all alternatives, risks, and complications -In regards to the toe we discussed multiple different options the toe. She states mobile time she will see Bonita QuinLinda proceed with amputation of the  toe. Prior to her hospitalization medially even talked about this. She wishes to proceed with this as soon as possible given her pain. I discussed with her the surgery as well as the postoperative course. Discussed with her that is a high chance of nonhealing given the other wounds have not healed. However the wound to her third toe to other areas the wounds actually heel to her foot except for the distal portion of the fourth digit. I'm hopeful that this will heal uneventfully but given her other issues there is a risk of this. Also discussed the potential further amputation. At this point given her symptoms she is wanting to take the risk and she wishes to proceed. -The incision placement as well as the postoperative course was discussed with the patient. I discussed risks of the surgery which include, but not limited to, infection, bleeding, pain, swelling, need for further surgery, delayed or nonhealing, painful or ugly scar, numbness or sensation changes, over/under correction, recurrence, transfer lesions, further deformity, hardware failure, DVT/PE, loss of toe/foot. Patient understands these risks and wishes to proceed with surgery. The surgical consent was reviewed with the patient all 3 pages were signed. No promises or guarantees were given to the outcome of the procedure. All questions were answered to the best of my ability. Before the surgery the patient was encouraged to call the office if there is any further questions. [facility] basis. Discussed admission to the hospital for this but she would like to try this as an outpatient to help avoid another hospital stay. I  do not see a contraindication to outpatient surgery for this infection/toe.  -Discussed with her vasculitis and pyoderma Gangrenosum given the large necrotic areas to her legs. In agreement the dermatology would be at her for her. Will try for her to see [facility] in [place]  as they are apart of the [facility] system. If not able to see her she is willing to go to a [place] setting.   *WILL SEND PATHOLOGY AND CULTURES INCLUDING AFB AND FUNGAL.   Ovid Curd, DPM

## 2016-07-25 NOTE — Patient Instructions (Signed)

## 2016-07-25 NOTE — Telephone Encounter (Signed)
Left detailed message on pt's voicemail. Pt called back and scheduled lab appt for today at 3:30pm.

## 2016-07-26 ENCOUNTER — Telehealth: Payer: Self-pay | Admitting: Podiatry

## 2016-07-26 ENCOUNTER — Telehealth: Payer: Self-pay | Admitting: Pharmacist

## 2016-07-26 ENCOUNTER — Telehealth: Payer: Self-pay | Admitting: *Deleted

## 2016-07-26 DIAGNOSIS — I96 Gangrene, not elsewhere classified: Secondary | ICD-10-CM

## 2016-07-26 DIAGNOSIS — L97521 Non-pressure chronic ulcer of other part of left foot limited to breakdown of skin: Secondary | ICD-10-CM

## 2016-07-26 NOTE — Telephone Encounter (Signed)
Spoke to [place] Beach[place]ally today regarding the labs that were drawn yesterday. Her SCr is trending up, as well as her wbc.  Her sodium is also a little low.  She is taking tramadol, as well as 2-4 ibuprofen or Aleve "every few hours".  She is also still taking losartan-HCTZ for her blood pressure.  Spent some time discussing with her and going over her labs.  She states it is partly her fault too because she is in so much pain, she does not get up to drink water much during the day, so she is not staying hydrated.  I told her I understood she was in so much pain and just the combination of dehydration, the BP med, and all of those NSAIDs are taking a hit on her kidneys. Told her to try and limit her NSAID intake and to try and be more hydrated. Also instructed her to let her PCP's office know and to arrange getting those labs repeated next week. She said she would.  She is also getting her toe amputated tomorrow.  The toe will be sent off for culture and the bone will be sent for biopsy per patient. Will let Dr. Daiva EvesVan Dam know.

## 2016-07-26 NOTE — Telephone Encounter (Signed)
closed

## 2016-07-26 NOTE — Telephone Encounter (Addendum)
-----   Message from Vivi BarrackMatthew R Wagoner, DPM sent at 07/25/2016  5:23 PM EDT ----- Can we see if we can get her into dermatology [facility] to look for vasculitis? She has large necrotic ulcers in her legs that look like a vasculitis to me. This has been ongoing for several weeks at this point, I would like to get her in with a Dermatologist to be seen. I am scheduling her for an amputation of the toe on Thursday. 07/26/2016-Faxed referral, clinicals and demographics to [facility]. I informed Tresa EndoKelly - [facility] if had faxed a referral.07/28/2016-Pamela Notchey, pt's friend and boss states pt is not taking the pain medication and never has taken it but maybe every other day for pain even prior to the surgery, they have gone over the post-op orders with pt and stated understanding. Rinaldo Cloudamela states pt is being checked on by Tammy a friend in the area, and pt is not elevating her legs, and is disoriented. Rinaldo Cloudamela states, Tammy said her dressing is clean at last visit this morning and pt did get pt to take an oxycodone at about 10:00am. Tammy stated that when she arrived at pt's house pt was in the recliner with legs down and would not elevate.Left message reiterating post op instructions to not weight bear or dangle her feet for more than 15 minutes/hour, remain in the surgical shoe/boot at all time, take the Oxycodone as directed and it would be monitored so as cover her pain. I informed Dr. Ardelle AntonWagoner of the concerns of Rinaldo Cloudamela and Tammy,and he ordered Arterial Duplex. I left message informing pt of Arterial duplex orders and asked for a call back and that I would inform Rinaldo Cloudamela. I left message informing Rinaldo Cloudamela of the arterial duplex orders and that I had called twice and not spoken to pt but left messages to call back.08/01/2016-Pt states she began vomiting and diarrhea last night, has completed the antibiotic from the other doctor, only taking the pain medication from Dr. Ardelle AntonWagoner. Pt states may  have to cancel today's appt. Pt states she has been taken PeptoBismol, still having diarrhea. I told pt to get a  friend to bring her an anti-diarrheal like immodium, and anti-nausea medication.Pt states she has been taking the anti-nausea medication with the antibiotic and will try again. Pt states she will try that and keep today's appt and use puppy pads in the car. Pt states DR. Ardelle AntonWagoner was going to order another medication once she completed the antibiotic. Unable to leave message informing pt to contact Dr. Daiva EvesVan Dam concerning C-dif.08/02/2016-[facility] and she states pt is scheduled for 09/27/2016 at 3:30pm to arrive at 3:15pm to see Dr. Gwen PoundsKowalski. I informed pt of [facility] appt and gave the address and contact number if pt would like to reschedule.08/04/2016-Unable to leave a message mail box is full. 08/04/2016-Unable to leave a message mailbox is full. Mailbox is full unable to leave any messages. Left message on Rinaldo Cloudamela Notchey's line to have pt call. I spoke with Rinaldo CloudPamela and informed her Dr. Ardelle AntonWagoner had wanted her to see her PCP to be evaluated for C-diff and that Dr. Daiva EvesVan Dam was looking in to an additional medication. Rinaldo Cloudamela states Dr. Daiva EvesVan Dam was to start her on steroids and to be followed up by dermatologist in 09/2016. I asked Rinaldo Cloudamela to also make sure pt kept her phone available for safety purposes. Rinaldo Cloudamela stated she will speak with pt.

## 2016-07-27 ENCOUNTER — Encounter: Payer: Self-pay | Admitting: Podiatry

## 2016-07-27 DIAGNOSIS — M86672 Other chronic osteomyelitis, left ankle and foot: Secondary | ICD-10-CM

## 2016-07-27 LAB — FUNGAL ORGANISM REFLEX

## 2016-07-27 LAB — FUNGUS CULTURE WITH STAIN

## 2016-07-27 LAB — FUNGUS CULTURE RESULT

## 2016-07-27 NOTE — Telephone Encounter (Signed)
Thanks so much Cassie! 

## 2016-07-31 ENCOUNTER — Encounter: Payer: MEDICAID | Admitting: Podiatry

## 2016-08-01 ENCOUNTER — Ambulatory Visit (INDEPENDENT_AMBULATORY_CARE_PROVIDER_SITE_OTHER): Payer: Self-pay | Admitting: Podiatry

## 2016-08-01 ENCOUNTER — Encounter: Payer: Self-pay | Admitting: Podiatry

## 2016-08-01 DIAGNOSIS — Z899 Acquired absence of limb, unspecified: Secondary | ICD-10-CM

## 2016-08-01 MED ORDER — OXYCODONE-ACETAMINOPHEN 5-325 MG PO TABS: 1.0000 | ORAL_TABLET | Freq: Four times a day (QID) | ORAL | 0 refills | Status: DC | PRN

## 2016-08-01 MED FILL — OXYCOD/ACETAMINOPHEN 5-325M: 5-325 | 4 days supply | Qty: 30 | Fill #0

## 2016-08-01 NOTE — Telephone Encounter (Signed)
She needs to contact Dr. Daiva EvesVan Dam. She may have C-dif and needs to also see her PCP. If she cannot come today, please get her into [place] later this week or with Dr. Logan BoresEvans in [place]. She [place] in [place] believe.

## 2016-08-01 NOTE — Progress Notes (Signed)
Subjective: Rita Watkins is a 61 y.o. is seen today in office s/p left 4th toe ampuation preformed on 07/27/2016. They state their pain is improved to her left foot since surgery. She does continue with percocet for pain, although I suspect his is more from the leg. She has remained in the surgical shoe since last appointment. She's been trauma keep her leg elevated. Denies any systemic complaints such as fevers, chills, nausea, vomiting. No calf pain, chest pain, shortness of breath.   Patient's caregiver states that the necrosis get her leg is actually getting worse and has spread into the back of her leg.  Objective: General: No acute distress, AAOx3  DP/PT pulses palpable 2/4, CRT < 3 sec to all digits.  Protective sensation intact. Motor function intact.  Left FOOT: Incision is well coapted without any evidence of dehiscence. There is macerated tissue around the incisional incision remains coapted with sutures intact. There is no necrosis to the area. There is no swelling erythema, ascending cellulitis. There is no fluctuance, crepitus, malodor. Since the surgery the [place] of left foot has improved in the color to the foot looks better and both hurt and her caregiver agree into the foot looks better.  Necrosis extending just below the knee to the thigh. Pain is incision intact this. No pain with calf compression, swelling, warmth, erythema.   Assessment and Plan:  Status post left 4th toe surgery, doing well with no complications   -Treatment options discussed including all alternatives, risks, and complications -Betadine wet-to-dry was applied to the macerated tissue. Keep dressing clean, dry, intact. -Elevation. -I did order an arterial duplex for her post-op. Given the necrosis want to make sure that her arterial flow is adequate. -She only has finished antibiotics today that were prescribed per Dr. Daiva EvesVan Dam. She is asking about steroids. I discussed with her this is out of my realm of  treatment. Will discuss with Dr. Daiva EvesVan Dam. Also, I put in for a dermatology consult last week and she has not been scheduled. Will follow-up with them as well.  -Pain medication as needed. Refilled percocet.  -Monitor for any clinical signs or symptoms of infection and DVT/PE and directed to call the office immediately should any occur or go to the ER. -Follow-up in 1 week with Dr. Marylene LandStover as I will be out of the office or sooner if any problems arise. In the meantime, encouraged to call the office with any questions, concerns, change in symptoms.  *Next appointment, likely leave sutures intact. I will see her back in [place] in 2 weeks for suture removal.   Ovid CurdMatthew Wagoner, DPM

## 2016-08-02 ENCOUNTER — Telehealth: Payer: Self-pay | Admitting: Podiatry

## 2016-08-02 NOTE — Telephone Encounter (Signed)
-----   Message from Vivi BarrackMatthew R Wagoner, DPM sent at 08/01/2016  2:27 PM EDT ----- Can you please follow up to see the status of dermatology consult?

## 2016-08-02 NOTE — Telephone Encounter (Signed)
Dr. Ardelle AntonWagoner, patient stated you told her yesterday during her first post-op to start calling you every morning to remind you to contact Dr. Daiva EvesVan Dam about antibiotics for her. Please advise. Thank you.

## 2016-08-03 ENCOUNTER — Encounter: Payer: Self-pay | Admitting: Podiatry

## 2016-08-03 LAB — ACID FAST CULTURE WITH REFLEXED SENSITIVITIES (MYCOBACTERIA): Acid Fast Culture: NEGATIVE

## 2016-08-04 ENCOUNTER — Telehealth: Payer: Self-pay | Admitting: Podiatry

## 2016-08-04 ENCOUNTER — Encounter: Payer: Self-pay | Admitting: Podiatry

## 2016-08-04 NOTE — Telephone Encounter (Signed)
-----   Message from Matthew R Wagoner, DPM sent at 08/03/2016  4:41 PM EDT ----- Can you please let her know this and that Dr. Van Dam is working on this? Thanks.  ----- Message ----- From: Van Dam, Cornelius N, MD Sent: 08/03/2016   4:15 PM To: Matthew R Wagoner, DPM, #  My intent was to continue the antibiotics. The problem for her will be that I will not be able to get her discounted zyvox or clairthromycin indefinitely. Cassie can we see how much it would cost her to fill zyvox and Clarthromycin? I believe she is to see me soon ----- Message ----- From: Wagoner, Matthew R, DPM Sent: 08/03/2016   4:05 PM To: Cornelius N Van Dam, MD  I got her an appointment with Dermatology at [facility] but it is not until August. We have given her the information to call herself as well to try to get in before that. Also, she has finished antibiotics and it looks like the leg has worsened. Do you want to continue them, or should we go ahead and start steroids for her? I think her foot is looking good s/p amputation!  [name]    

## 2016-08-04 NOTE — Telephone Encounter (Signed)
-----   Message from Vivi BarrackMatthew R Wagoner, DPM sent at 08/03/2016  4:41 PM EDT ----- Can you please let her know this and that Dr. Daiva EvesVan Dam is working on this? Thanks.  ----- Message ----- From: Daiva EvesVan Dam, Lisette Grinderornelius N, MD Sent: 08/03/2016   4:15 PM To: Vivi BarrackMatthew R Wagoner, DPM, #  My intent was to continue the antibiotics. The problem for her will be that I will not be able to get her discounted zyvox or clairthromycin indefinitely. Cassie can we see how much it would cost her to fill zyvox and Clarthromycin? I believe she is to see me soon ----- Message ----- From: Vivi BarrackWagoner, Matthew R, DPM Sent: 08/03/2016   4:05 PM To: Randall Hissornelius N Van Dam, MD  I got her an appointment with Dermatology at [facility] but it is not until August. We have given her the information to call herself as well to try to get in before that. Also, she has finished antibiotics and it looks like the leg has worsened. Do you want to continue them, or should we go ahead and start steroids for her? I think her foot is looking good s/p amputation!  [name]

## 2016-08-04 NOTE — Telephone Encounter (Signed)
This is Rita AlmasSally Harp. I guess someone was just trying to reach me from Dr. Gabriel RungWagoner's office. I don't understand why they couldn't leave a voicemail its not full or anything. If you could please call me at [phone].

## 2016-08-04 NOTE — Telephone Encounter (Signed)
I didn't try to call. Val did you? I sent her a message through MyChart last night giving her an update (which we had discussed).

## 2016-08-08 ENCOUNTER — Telehealth: Payer: Self-pay | Admitting: Podiatry

## 2016-08-08 NOTE — Telephone Encounter (Signed)
FYI pt called earlier today( 229pm)and rescheduled her appt to Friday 7.6.18.She said she had diarrhea and would not be able to make it today.

## 2016-08-08 NOTE — Progress Notes (Signed)
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

## 2016-08-11 ENCOUNTER — Ambulatory Visit (INDEPENDENT_AMBULATORY_CARE_PROVIDER_SITE_OTHER): Payer: Self-pay | Admitting: Podiatry

## 2016-08-11 DIAGNOSIS — Z899 Acquired absence of limb, unspecified: Secondary | ICD-10-CM

## 2016-08-13 LAB — ACID FAST CULTURE WITH REFLEXED SENSITIVITIES (MYCOBACTERIA)
Acid Fast Culture: NEGATIVE
Acid Fast Culture: NEGATIVE

## 2016-08-13 LAB — ACID FAST CULTURE WITH REFLEXED SENSITIVITIES: ACID FAST CULTURE - AFSCU3: NEGATIVE

## 2016-08-14 ENCOUNTER — Telehealth: Payer: Self-pay | Admitting: Podiatry

## 2016-08-14 ENCOUNTER — Encounter (HOSPITAL_COMMUNITY): Payer: Self-pay | Admitting: Emergency Medicine

## 2016-08-14 ENCOUNTER — Inpatient Hospital Stay (HOSPITAL_COMMUNITY)
Admission: EM | Admit: 2016-08-14 | Discharge: 2016-08-17 | DRG: 603 | Payer: Self-pay | Attending: Internal Medicine | Admitting: Internal Medicine

## 2016-08-14 DIAGNOSIS — L88 Pyoderma gangrenosum: Principal | ICD-10-CM | POA: Diagnosis present

## 2016-08-14 DIAGNOSIS — Z6835 Body mass index (BMI) 35.0-35.9, adult: Secondary | ICD-10-CM

## 2016-08-14 DIAGNOSIS — F17210 Nicotine dependence, cigarettes, uncomplicated: Secondary | ICD-10-CM | POA: Diagnosis present

## 2016-08-14 DIAGNOSIS — E876 Hypokalemia: Secondary | ICD-10-CM | POA: Diagnosis present

## 2016-08-14 DIAGNOSIS — Z888 Allergy status to other drugs, medicaments and biological substances status: Secondary | ICD-10-CM

## 2016-08-14 DIAGNOSIS — Z886 Allergy status to analgesic agent status: Secondary | ICD-10-CM

## 2016-08-14 DIAGNOSIS — Z89422 Acquired absence of other left toe(s): Secondary | ICD-10-CM

## 2016-08-14 DIAGNOSIS — I10 Essential (primary) hypertension: Secondary | ICD-10-CM | POA: Diagnosis present

## 2016-08-14 DIAGNOSIS — E785 Hyperlipidemia, unspecified: Secondary | ICD-10-CM | POA: Diagnosis present

## 2016-08-14 DIAGNOSIS — W19XXXA Unspecified fall, initial encounter: Secondary | ICD-10-CM | POA: Diagnosis present

## 2016-08-14 DIAGNOSIS — Z79899 Other long term (current) drug therapy: Secondary | ICD-10-CM

## 2016-08-14 DIAGNOSIS — Z881 Allergy status to other antibiotic agents status: Secondary | ICD-10-CM

## 2016-08-14 DIAGNOSIS — X58XXXA Exposure to other specified factors, initial encounter: Secondary | ICD-10-CM | POA: Diagnosis present

## 2016-08-14 DIAGNOSIS — J449 Chronic obstructive pulmonary disease, unspecified: Secondary | ICD-10-CM | POA: Diagnosis present

## 2016-08-14 DIAGNOSIS — F418 Other specified anxiety disorders: Secondary | ICD-10-CM | POA: Diagnosis present

## 2016-08-14 DIAGNOSIS — I129 Hypertensive chronic kidney disease with stage 1 through stage 4 chronic kidney disease, or unspecified chronic kidney disease: Secondary | ICD-10-CM | POA: Diagnosis present

## 2016-08-14 DIAGNOSIS — N179 Acute kidney failure, unspecified: Secondary | ICD-10-CM | POA: Diagnosis present

## 2016-08-14 DIAGNOSIS — D631 Anemia in chronic kidney disease: Secondary | ICD-10-CM | POA: Diagnosis present

## 2016-08-14 DIAGNOSIS — S81802S Unspecified open wound, left lower leg, sequela: Secondary | ICD-10-CM

## 2016-08-14 DIAGNOSIS — N183 Chronic kidney disease, stage 3 (moderate): Secondary | ICD-10-CM | POA: Diagnosis present

## 2016-08-14 DIAGNOSIS — D72829 Elevated white blood cell count, unspecified: Secondary | ICD-10-CM | POA: Diagnosis present

## 2016-08-14 HISTORY — DX: Essential (primary) hypertension: I10

## 2016-08-14 HISTORY — DX: Pyoderma gangrenosum: L88

## 2016-08-14 LAB — COMPREHENSIVE METABOLIC PANEL
ALBUMIN: 2.6 g/dL — AB (ref 3.5–5.0)
ALK PHOS: 128 U/L — AB (ref 38–126)
ALT: 12 U/L — AB (ref 14–54)
ANION GAP: 13 (ref 5–15)
AST: 20 U/L (ref 15–41)
BILIRUBIN TOTAL: 0.7 mg/dL (ref 0.3–1.2)
BUN: 32 mg/dL — AB (ref 6–20)
CALCIUM: 8.4 mg/dL — AB (ref 8.9–10.3)
CO2: 24 mmol/L (ref 22–32)
Chloride: 98 mmol/L — ABNORMAL LOW (ref 101–111)
Creatinine, Ser: 1.61 mg/dL — ABNORMAL HIGH (ref 0.44–1.00)
GFR calc Af Amer: 39 mL/min — ABNORMAL LOW (ref >60)
GFR calc non Af Amer: 33 mL/min — ABNORMAL LOW (ref >60)
GLUCOSE: 102 mg/dL — AB (ref 65–99)
Potassium: 3.4 mmol/L — ABNORMAL LOW (ref 3.5–5.1)
SODIUM: 135 mmol/L (ref 135–145)
TOTAL PROTEIN: 8 g/dL (ref 6.5–8.1)

## 2016-08-14 LAB — CBC WITH DIFFERENTIAL/PLATELET
BASOS ABS: 0 10*3/uL (ref 0.0–0.1)
BASOS PCT: 0 %
EOS ABS: 0.2 10*3/uL (ref 0.0–0.7)
Eosinophils Relative: 1 %
HEMATOCRIT: 23.7 % — AB (ref 36.0–46.0)
HEMOGLOBIN: 7.7 g/dL — AB (ref 12.0–15.0)
Lymphocytes Relative: 13 %
Lymphs Abs: 2.5 10*3/uL (ref 0.7–4.0)
MCH: 26.5 pg (ref 26.0–34.0)
MCHC: 32.5 g/dL (ref 30.0–36.0)
MCV: 81.4 fL (ref 78.0–100.0)
Monocytes Absolute: 2 10*3/uL — ABNORMAL HIGH (ref 0.1–1.0)
Monocytes Relative: 10 %
NEUTROS ABS: 15.2 10*3/uL — AB (ref 1.7–7.7)
NEUTROS PCT: 76 %
Platelets: 504 10*3/uL — ABNORMAL HIGH (ref 150–400)
RBC: 2.91 MIL/uL — AB (ref 3.87–5.11)
RDW: 14.4 % (ref 11.5–15.5)
WBC: 20 10*3/uL — ABNORMAL HIGH (ref 4.0–10.5)

## 2016-08-14 LAB — I-STAT CHEM 8, ED
BUN: 30 mg/dL — AB (ref 6–20)
CALCIUM ION: 1.06 mmol/L — AB (ref 1.15–1.40)
CREATININE: 1.5 mg/dL — AB (ref 0.44–1.00)
Chloride: 95 mmol/L — ABNORMAL LOW (ref 101–111)
Glucose, Bld: 105 mg/dL — ABNORMAL HIGH (ref 65–99)
HCT: 25 % — ABNORMAL LOW (ref 36.0–46.0)
Hemoglobin: 8.5 g/dL — ABNORMAL LOW (ref 12.0–15.0)
Potassium: 3.4 mmol/L — ABNORMAL LOW (ref 3.5–5.1)
SODIUM: 135 mmol/L (ref 135–145)
TCO2: 26 mmol/L (ref 0–100)

## 2016-08-14 LAB — I-STAT CG4 LACTIC ACID, ED: Lactic Acid, Venous: 1.78 mmol/L (ref 0.5–1.9)

## 2016-08-14 MED ORDER — ONDANSETRON HCL 4 MG/2ML IJ SOLN: 4.0000 mg | Freq: Four times a day (QID) | INTRAMUSCULAR | Status: DC | PRN

## 2016-08-14 MED ORDER — ALPRAZOLAM 0.5 MG PO TABS
0.5000 mg | ORAL_TABLET | Freq: Two times a day (BID) | ORAL | Status: DC | PRN
  Administered 2016-08-14: 0.5 mg via ORAL
  Filled 2016-08-14: qty 1

## 2016-08-14 MED ORDER — PIPERACILLIN-TAZOBACTAM 3.375 G IVPB
3.3750 g | Freq: Three times a day (TID) | INTRAVENOUS | Status: DC
  Administered 2016-08-14 – 2016-08-15 (×3): 3.375 g via INTRAVENOUS
  Filled 2016-08-14 (×4): qty 50

## 2016-08-14 MED ORDER — ONDANSETRON HCL 4 MG PO TABS: 4.0000 mg | ORAL_TABLET | Freq: Three times a day (TID) | ORAL | Status: DC | PRN

## 2016-08-14 MED ORDER — MORPHINE SULFATE (PF) 4 MG/ML IV SOLN
4.0000 mg | Freq: Once | INTRAVENOUS | Status: AC
  Administered 2016-08-14: 4 mg via INTRAVENOUS
  Filled 2016-08-14: qty 1

## 2016-08-14 MED ORDER — VANCOMYCIN HCL 10 G IV SOLR
1250.0000 mg | INTRAVENOUS | Status: DC
  Administered 2016-08-15: 1250 mg via INTRAVENOUS
  Filled 2016-08-14: qty 1250

## 2016-08-14 MED ORDER — ACETAMINOPHEN 325 MG PO TABS: 650.0000 mg | ORAL_TABLET | Freq: Four times a day (QID) | ORAL | Status: DC | PRN

## 2016-08-14 MED ORDER — POTASSIUM CHLORIDE CRYS ER 20 MEQ PO TBCR
40.0000 meq | EXTENDED_RELEASE_TABLET | Freq: Once | ORAL | Status: AC
  Administered 2016-08-14: 40 meq via ORAL
  Filled 2016-08-14: qty 2

## 2016-08-14 MED ORDER — ONDANSETRON HCL 4 MG PO TABS: 4.0000 mg | ORAL_TABLET | Freq: Four times a day (QID) | ORAL | Status: DC | PRN

## 2016-08-14 MED ORDER — SODIUM CHLORIDE 0.9% FLUSH: 3.0000 mL | Freq: Two times a day (BID) | INTRAVENOUS | Status: DC

## 2016-08-14 MED ORDER — VITAMIN C 500 MG PO TABS
500.0000 mg | ORAL_TABLET | Freq: Every day | ORAL | Status: DC
  Administered 2016-08-15 – 2016-08-16 (×2): 500 mg via ORAL
  Filled 2016-08-14 (×2): qty 1

## 2016-08-14 MED ORDER — HYDROMORPHONE HCL-NACL 0.5-0.9 MG/ML-% IV SOSY
1.0000 mg | PREFILLED_SYRINGE | INTRAVENOUS | Status: DC | PRN
  Administered 2016-08-14 – 2016-08-16 (×6): 1 mg via INTRAVENOUS
  Filled 2016-08-14 (×6): qty 2

## 2016-08-14 MED ORDER — OXYCODONE-ACETAMINOPHEN 5-325 MG PO TABS
1.0000 | ORAL_TABLET | Freq: Four times a day (QID) | ORAL | Status: DC | PRN
  Administered 2016-08-14 – 2016-08-15 (×2): 2 via ORAL
  Filled 2016-08-14 (×2): qty 2

## 2016-08-14 MED ORDER — SODIUM CHLORIDE 0.9 % IV SOLN
INTRAVENOUS | Status: DC
  Administered 2016-08-14: 21:00:00 via INTRAVENOUS

## 2016-08-14 MED ORDER — VANCOMYCIN HCL 10 G IV SOLR
2250.0000 mg | INTRAVENOUS | Status: AC
  Administered 2016-08-14: 2250 mg via INTRAVENOUS
  Filled 2016-08-14: qty 500

## 2016-08-14 MED ORDER — PIPERACILLIN-TAZOBACTAM 3.375 G IVPB 30 MIN
3.3750 g | INTRAVENOUS | Status: AC
  Administered 2016-08-14: 3.375 g via INTRAVENOUS
  Filled 2016-08-14: qty 50

## 2016-08-14 NOTE — Progress Notes (Signed)
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 3rd and 4th left toe are purple/black. Concerning area of skin sloughing and yellowing also noted to left lower abdomen.

## 2016-08-14 NOTE — ED Provider Notes (Signed)
[facility]-EMERGENCY DEPT Provider Note   CSN: [id number] Arrival date & time: 08/14/16  1213     History   Chief Complaint Chief Complaint  Patient presents with  . leg infection    HPI Rita Watkins is a 61 y.o. female presenting with 3 week history of worsening leg wound.  Patient being seen by Dr. Algis Liming with infectious disease and Dr. Ardelle Anton with podiatry. Patient presenting today for worsening pain and spread of redness surrounding chronic wound. Patient states left leg with erythema spreading proximally. Additionally, patient presenting with new lesions on the abdomen 2 weeks. She reports that the lesions on her abdomen are similar to how her leg looked initially several months prior. Patient currently not on antibiotics, as they were stopped by Dr. Algis Liming. Patient supposed to get several tests (C. Difficile, arterial ultrasound) prior to being seen by dermatology and to be evaluated for pyoderma gangrenosum. She has not started steroids for treatment. Patient states that she is unable to care for herself at home, and she has missed last 2 appointments due to the fact that she physically is unable to make it to the appointments. Patient reports she fell today when her cane got caught. She denies injury from this fall, denies hitting her head or loss of consciousness.  Currently she reports no fever, chills, nausea, vomiting, abdominal pain, diarrhea, or urinary symptoms.  HPI  Past Medical History:  Diagnosis Date  . Allergy   . Anxiety   . Asthma   . COPD (chronic obstructive pulmonary disease) (HCC)   . Depression   . Dizziness 07/19/2016  . Hyperlipidemia   . Hypertension   . Lymphangitis 06/07/2016  . 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 Nausea 07/19/2016  . Necrosis (HCC) 07/19/2016    Patient Active Problem List   Diagnosis Date Noted  . Cellulitis 08/14/2016  . Necrosis (HCC) 07/19/2016  . Nausea 07/19/2016  . Dizziness 07/19/2016  . Cellulitis of left lower  extremity 06/26/2016  . Skin bulla   . Vasculitis (HCC)   . Necrotic toes (HCC)   . Mycobacterial infection 06/07/2016  . Lymphangitis 06/07/2016  . Abscess   . Morbid obesity (HCC)   . Left knee pain 03/15/2015  . Leg swelling 07/30/2014  . Chronic respiratory failure (HCC) 07/30/2014  . Leukocytosis 07/30/2014  . Otitis media 07/30/2014  . HTN (hypertension) 07/21/2014  . Pulmonary infiltrate in right lung on chest x-ray 07/21/2014  . Spinal stenosis of lumbar region 07/21/2014  . COPD pfts pending  07/15/2014  . Cigarette smoker 07/15/2014  . COPD exacerbation (HCC) 04/26/2014  . Acute respiratory failure with hypoxia (HCC) 04/26/2014  . Acute bronchiolitis due to other infectious organisms 04/26/2014  . Depression with anxiety 09/13/2012  . Panic attacks 06/17/2012  . Hyperlipidemia 09/23/2011  . Chronic pain 09/23/2011  . Vitamin D deficiency 09/23/2011    Past Surgical History:  Procedure Laterality Date  . FOOT SURGERY     Left  . HIP PINNING     3 each hip  . IRRIGATION AND DEBRIDEMENT ABSCESS N/A 06/27/2016   Procedure: IRRIGATION AND DEBRIDEMENT LEFT LEG WITH SOTFT TISSUE BIOPSY;  Surgeon: Berna Bue, MD;  Location: [facility] ORS;  Service: General;  Laterality: N/A;  . LYMPH NODE BIOPSY Left 06/09/2016   Procedure: BIOPSY LEFT LEG;  Surgeon: Peggye Form, DO;  Location: [facility] ORS;  Service: Plastics;  Laterality: Left;  . SLIPPED CAPITAL FEMORAL EPIPHYSIS PINNING      OB History  No data available       Home Medications    Prior to Admission medications   Medication Sig Start Date End Date Taking? Authorizing Provider  ALPRAZolam Prudy Feeler) 0.5 MG tablet Take 1 tablet (0.5 mg total) by mouth 2 (two) times daily as needed for anxiety. 06/30/16  Yes Dorothea Ogle, MD  losartan-hydrochlorothiazide [facility]) 100-12.5 MG tablet Take 1 tablet by mouth daily. 04/05/16  Yes Saguier, Ramon Dredge, PA-C  oxyCODONE-acetaminophen (PERCOCET/ROXICET) 5-325 MG tablet Take  1-2 tablets by mouth every 6 (six) hours as needed for severe pain. 08/01/16  Yes Vivi Barrack, DPM  vitamin C (ASCORBIC ACID) 500 MG tablet Take 500 mg by mouth daily.   Yes [provider]  acetaminophen (TYLENOL) 325 MG tablet Take 650 mg by mouth every 6 (six) hours as needed for moderate pain.     [provider]  clarithromycin (BIAXIN) 500 MG tablet Take 1 tablet (500 mg total) by mouth every 12 (twelve) hours. Patient not taking: Reported on 08/14/2016 06/30/16   Dorothea Ogle, MD  gabapentin (NEURONTIN) 300 MG capsule Take 1 capsule (300 mg total) by mouth at bedtime. Patient not taking: Reported on 07/19/2016 05/02/16   Vivi Barrack, DPM  ibuprofen (ADVIL,MOTRIN) 200 MG tablet Take 800 mg by mouth every 6 (six) hours as needed for moderate pain.     [provider]  linezolid (ZYVOX) 600 MG tablet Take 1 tablet (600 mg total) by mouth every 12 (twelve) hours. Patient not taking: Reported on 08/14/2016 07/05/16   Daiva Eves, Lisette Grinder, MD  ondansetron (ZOFRAN) 4 MG tablet Take 1 tablet (4 mg total) by mouth every 8 (eight) hours as needed for nausea or vomiting. 07/14/16   Randall Hiss, MD  oxyCODONE (OXY IR/ROXICODONE) 5 MG immediate release tablet Take 1 tablet (5 mg total) by mouth every 6 (six) hours as needed for severe pain or breakthrough pain. Patient not taking: Reported on 07/19/2016 06/30/16   Edson Snowball, PA-C    Family History Family History  Problem Relation Age of Onset  . Hypertension Mother   . Hyperlipidemia Mother   . Hypertension Brother   . Asthma Brother     Social History Social History  Substance Use Topics  . Smoking status: Current Every Day Smoker    Packs/day: 1.00    Years: 35.00    Types: Cigarettes  . Smokeless tobacco: Never Used  . Alcohol use No     Allergies   Avelox [moxifloxacin hcl in nacl]; Terbinafine hcl; Zocor [simvastatin - high dose]; Montelukast sodium; and Pravastatin   Review of  Systems Review of Systems  Constitutional: Negative for chills and fever.  Respiratory: Negative for cough and shortness of breath.   Cardiovascular: Negative for chest pain and palpitations.  Gastrointestinal: Negative for abdominal pain, constipation, diarrhea, nausea and vomiting.  Genitourinary: Negative for dysuria, frequency and hematuria.  Skin: Positive for color change and wound.  Psychiatric/Behavioral: Negative for confusion.     Physical Exam Updated Vital Signs BP 113/66 (BP Location: Left Arm)   Pulse 91   Temp 98.5 F (36.9 C) (Oral)   Resp 18   Ht 5\' 9"  (1.753 m)   Wt 108.9 kg (240 lb)   SpO2 97%   BMI 35.44 kg/m   Physical Exam  Constitutional: She is oriented to person, place, and time. She appears well-developed and well-nourished. No distress.  HENT:  Head: Normocephalic and atraumatic.  Eyes: Pupils are equal, round, and  reactive to light.  Neck: Normal range of motion.  Cardiovascular: Normal rate and regular rhythm.   Pulmonary/Chest: Effort normal and breath sounds normal.  Abdominal: Soft. There is no tenderness.  Multiple wet lesions on the abdomen on the left lower quadrant. Some surrounding erythema, but no active drainage seen.   Musculoskeletal:  Decreased range of motion due to pain. Patient is ambulatory with a cane.  Neurological: She is alert and oriented to person, place, and time.  Skin:  Patient with wound on medial left leg starting at the knee traveling distally towards the foot (see picture). Wound is black with purulent odor. Additionally, patient s/p fourth toe amputation. Surrounding toes showing new black tissue and no sensation.  Psychiatric: She has a normal mood and affect.  Nursing note and vitals reviewed.            ED Treatments / Results  Labs (all labs ordered are listed, but only abnormal results are displayed) Labs Reviewed  CBC WITH DIFFERENTIAL/PLATELET - Abnormal; Notable for the following:        Result Value   WBC 20.0 (*)    RBC 2.91 (*)    Hemoglobin 7.7 (*)    HCT 23.7 (*)    Platelets 504 (*)    Neutro Abs 15.2 (*)    Monocytes Absolute 2.0 (*)    All other components within normal limits  COMPREHENSIVE METABOLIC PANEL - Abnormal; Notable for the following:    Potassium 3.4 (*)    Chloride 98 (*)    Glucose, Bld 102 (*)    BUN 32 (*)    Creatinine, Ser 1.61 (*)    Calcium 8.4 (*)    Albumin 2.6 (*)    ALT 12 (*)    Alkaline Phosphatase 128 (*)    GFR calc non Af Amer 33 (*)    GFR calc Af Amer 39 (*)    All other components within normal limits  I-STAT CHEM 8, ED - Abnormal; Notable for the following:    Potassium 3.4 (*)    Chloride 95 (*)    BUN 30 (*)    Creatinine, Ser 1.50 (*)    Glucose, Bld 105 (*)    Calcium, Ion 1.06 (*)    Hemoglobin 8.5 (*)    HCT 25.0 (*)    All other components within normal limits  CULTURE, BLOOD (ROUTINE X 2)  CULTURE, BLOOD (ROUTINE X 2)  I-STAT CG4 LACTIC ACID, ED    EKG  EKG Interpretation None       Radiology No results found.  Procedures Procedures (including critical care time)  Medications Ordered in ED Medications  vancomycin (VANCOCIN) 2,250 mg in sodium chloride 0.9 % 500 mL IVPB (2,250 mg Intravenous New Bag/Given 08/14/16 1542)  piperacillin-tazobactam (ZOSYN) IVPB 3.375 g (not administered)  vancomycin (VANCOCIN) 1,250 mg in sodium chloride 0.9 % 250 mL IVPB (not administered)  piperacillin-tazobactam (ZOSYN) IVPB 3.375 g (3.375 g Intravenous New Bag/Given 08/14/16 1607)  morphine 4 MG/ML injection 4 mg (4 mg Intravenous Given 08/14/16 1608)     Initial Impression / Assessment and Plan / ED Course  I have reviewed the triage vital signs and the nursing notes.  Pertinent labs & imaging results that were available during my care of the patient were reviewed by me and considered in my medical decision making (see chart for details).     Patient presenting with worsening wound on medial left leg.  Patient reports some spread of erythema, and discoloration  of the previously look like healthy tissue. Patient with extensive wound from the knee almost to the ankle. Tissue is black and smell as purulent. Additionally, patient s/p fourth toe amputation, and new (within 2 wks) blackening of tissue surrounding toes with no sensation. Patient lives at home by herself, and is unable to take care of herself. Will order labs, lactate, blood cultures. Will start patient on IV antibiotics.  Labs show white count elevated at 20, creatinine elevated at 1.6 and lactate normal at 1.7. Vitals reassuring. Will give morphine for pain control. Will contact hospitalist regarding admission.  Discussed with hospitalist, and hospitalist will admit. Surgery consulted.  Surgery states they will round on patient in the morning.  Final Clinical Impressions(s) / ED Diagnoses   Final diagnoses:  Wound of left lower extremity, sequela    New Prescriptions New Prescriptions   No medications on file     Alveria ApleyCaccavale, Britania Shreeve, PA-C 08/14/16 1654

## 2016-08-14 NOTE — Telephone Encounter (Signed)
Left message informing pt's friend Lennie Odoramela Notchey, I would inform Dr[facility] Ard[facility] AntonWagoner [facility]

## 2016-08-14 NOTE — ED Provider Notes (Signed)
Medical screening examination/treatment/procedure(s) were conducted as a shared visit with non-physician practitioner(s) and myself.  I personally evaluated the patient during the encounter.   EKG Interpretation None     61 year old female here with severe left lower extremity infection. Wound smells of Pseudomonas and has eschar with exposed adipose tissue with purulent drainage. Was started on IV antibiotics and admitted to the hospital   Lorre NickAllen, Gissell Barra, MD 08/14/16 1446

## 2016-08-14 NOTE — ED Notes (Signed)
Called to give report on 3W, RN states that pt has been moved to a telemetry floor and will not be going to the unit.

## 2016-08-14 NOTE — Telephone Encounter (Signed)
I have spoken to her friend tonight. She was very thankful about the call back and discussed the case. I encouraged her to call us with any update or if there is anything else we can do for them and she was very appreciative.

## 2016-08-14 NOTE — H&P (Signed)
History and Physical    Rita Watkins:[id number] DOB: Jul 03, 1955 DOA: 08/14/2016  Referring MD/NP/PA: Dr. Freida Busman  PCP: Esperanza Richters, [place]   Outpatient Specialists: Jillyn Ledger ([facility]); Dr. Daiva Eves (ID)  Patient coming from: home  Chief Complaint: left leg wound  HPI: Rita Watkins is a 61 y.o. 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   Patient's history starts from January 2018 when she dropped bottle of water on her foot, has sustained chronic infection on her left toe which as noted above has worsened to the point of necrosis that she required amputation. Somewhere around April 2018 she started to develop redness and what seemed to be an abscess of the left knee medially which worsened so quickly to the point that she required few hospitalizations and apparently multiple cultures done before were not adequate to assess what exactly is going on. With the thought was that patient possibly has pyoderma gangrenosum. She finally underwent debridement and the cultures were taken but again the organism was not found per patient. She has had wound care at home. She has followed with Dr. Ardelle Anton of podiatry as well as with infectious disease. With presentation to the hospital is that she fell. Her friend went to check on her and when they looked at the left leg they felt that infection has exponentially grown over past week or so. Patient herself does not report any fevers or chills. She does say that her wound has gotten drastically worse over short period of time with foul smell, purulent drainage. Patient also has new lesion on the abdomen for past 2 weeks. She hasn't been able to schedule an appointment to take a look at that lesion.  ED Course: Vital signs are stable in ED. Blood pressure is 98/52 but it has improved to  113/66. Blood work is notable for leukocytosis of 20, hemoglobin 7.7 and subsequently 8.5. Potassium was 3.4 and he was supplemented beneath the. Creatinine was 1.61 and lactic acid 1.78. Patient was started on empiric antibiotics, vancomycin and Zosyn. Surgery will see her in consultation.  Review of Systems:  Constitutional: Negative for fever, chills, diaphoresis, activity change, appetite change and fatigue.  HENT: Negative for ear pain, nosebleeds, congestion, facial swelling, rhinorrhea, neck pain, neck stiffness and ear discharge.   Eyes: Negative for pain, discharge, redness, itching and visual disturbance.  Respiratory: Negative for cough, choking, chest tightness, shortness of breath, wheezing and stridor.   Cardiovascular: Negative for chest pain, palpitations and leg swelling.  Gastrointestinal: Negative for abdominal distention.  Genitourinary: Negative for dysuria, urgency, frequency, hematuria, flank pain, decreased urine volume, difficulty urinating and dyspareunia.  Musculoskeletal: Negative for back pain, joint swelling, arthralgias and gait problem.  Neurological: Negative for dizziness, tremors, seizures, syncope, facial asymmetry, speech difficulty, weakness, light-headedness, numbness and headaches.  Hematological: Negative for adenopathy. Does not bruise/bleed easily.  Psychiatric/Behavioral: Negative for hallucinations, behavioral problems, confusion, dysphoric mood, decreased concentration and agitation.   Past Medical History:  Diagnosis Date  . Allergy   . Anxiety   . Asthma   . COPD (chronic obstructive pulmonary disease) (HCC)   . Depression   . Dizziness 07/19/2016  . Hyperlipidemia   . Hypertension   . Lymphangitis 06/07/2016  . 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 Nausea 07/19/2016  . Necrosis (HCC) 07/19/2016    Past Surgical History:  Procedure Laterality Date  . FOOT SURGERY     Left  . HIP PINNING     3 each hip  . IRRIGATION AND  DEBRIDEMENT ABSCESS N/A 06/27/2016   Procedure: IRRIGATION AND DEBRIDEMENT LEFT LEG WITH SOTFT TISSUE BIOPSY;  Surgeon: Berna Bue, MD;  Location: [facility];  Service: General;  Laterality: N/A;  . LYMPH NODE BIOPSY Left 06/09/2016   Procedure: BIOPSY LEFT LEG;  Surgeon: Peggye Form, DO;  Location: [facility];  Service: Plastics;  Laterality: Left;  . SLIPPED CAPITAL FEMORAL EPIPHYSIS PINNING      Social history:  reports that she has been smoking Cigarettes.  She has a 35.00 pack-year smoking history. She has never used smokeless tobacco. She reports that she does not drink alcohol or use drugs.  Ambulation:Ambulates without assistance at baseline.   Allergies  Allergen Reactions  . Avelox [Moxifloxacin Hcl In Nacl] Anaphylaxis  . Terbinafine Hcl Swelling  . Zocor [Simvastatin - High Dose] Anaphylaxis  . Montelukast Sodium Other (See Comments)    Insomnia  . Pravastatin Other (See Comments)    Myalgias on 20mg     Family History  Problem Relation Age of Onset  . Hypertension Mother   . Hyperlipidemia Mother   . Hypertension Brother   . Asthma Brother     Prior to Admission medications   Medication Sig Start Date End Date Taking? Authorizing Provider  ALPRAZolam Prudy Feeler) 0.5 MG tablet Take 1 tablet (0.5 mg total) by mouth 2 (two) times daily as needed for anxiety. 06/30/16  Yes Dorothea Ogle, MD  losartan-hydrochlorothiazide [facility]) 100-12.5 MG tablet Take 1 tablet by mouth daily. 04/05/16  Yes Saguier, Ramon Dredge, PA-C  oxyCODONE-acetaminophen (PERCOCET/ROXICET) 5-325 MG tablet Take 1-2 tablets by mouth every 6 (six) hours as needed for severe pain. 08/01/16  Yes Vivi Barrack, DPM  vitamin C (ASCORBIC ACID) 500 MG tablet Take 500 mg by mouth daily.   Yes [provider]  acetaminophen (TYLENOL) 325 MG tablet Take 650 mg by mouth every 6 (six) hours as needed for moderate pain.     [provider]  clarithromycin (BIAXIN) 500 MG tablet Take 1 tablet (500 mg  total) by mouth every 12 (twelve) hours. Patient not taking: Reported on 08/14/2016 06/30/16   Dorothea Ogle, MD  gabapentin (NEURONTIN) 300 MG capsule Take 1 capsule (300 mg total) by mouth at bedtime. Patient not taking: Reported on 07/19/2016 05/02/16   Vivi Barrack, DPM  ibuprofen (ADVIL,MOTRIN) 200 MG tablet Take 800 mg by mouth every 6 (six) hours as needed for moderate pain.     [provider]  linezolid (ZYVOX) 600 MG tablet Take 1 tablet (600 mg total) by mouth every 12 (twelve) hours. Patient not taking: Reported on 08/14/2016 07/05/16   Daiva Eves, Lisette Grinder, MD  ondansetron (ZOFRAN) 4 MG tablet Take 1 tablet (4 mg total) by mouth every 8 (eight) hours as needed for nausea or vomiting. 07/14/16   Randall Hiss, MD  oxyCODONE (OXY IR/ROXICODONE) 5 MG immediate release tablet Take 1 tablet (5 mg total) by mouth every 6 (six) hours as needed for severe pain or breakthrough pain. Patient not taking: Reported on 07/19/2016 06/30/16   Edson Snowball, PA-C    Physical Exam: Vitals:   08/14/16 1345  BP: 113/66  Pulse: 91  Resp: 18  Temp: 98.5 F (36.9 C)  TempSrc: Oral  SpO2: 97%  Weight: 108.9 kg (240 lb)  Height: 5\' 9"  (1.753 m)  Constitutional: NAD, calm, comfortable Vitals:   08/14/16 1345  BP: 113/66  Pulse: 91  Resp: 18  Temp: 98.5 F (36.9 C)  TempSrc: Oral  SpO2: 97%  Weight: 108.9 kg (240 lb)  Height: 5\' 9"  (1.753 m)   Eyes: PERRL, lids and conjunctivae normal ENMT: Mucous membranes are moist. Posterior pharynx clear of any exudate or lesions.Normal dentition.  Neck: normal, supple, no masses, no thyromegaly Respiratory: clear to auscultation bilaterally, no wheezing, no crackles. Normal respiratory effort. No accessory muscle use.  Cardiovascular: Regular rate and rhythm, no murmurs / rubs / gallops. No extremity edema. 2+ pedal pulses. No carotid bruits.  Abdomen: no tenderness, no masses palpated. No hepatosplenomegaly. Bowel sounds positive.    Musculoskeletal: no contractures. No swelling in knee joints Neurologic: CN 2-12 grossly intact. Sensation intact, DTR normal. Strength 5/5 in all 4.  Psychiatric: Normal judgment and insight. Alert and oriented x 3. Normal mood.  Skin:       Labs on Admission: I have personally reviewed following labs and imaging studies  CBC:  Recent Labs Lab 08/14/16 1539 08/14/16 1611  WBC 20.0*  --   NEUTROABS 15.2*  --   HGB 7.7* 8.5*  HCT 23.7* 25.0*  MCV 81.4  --   PLT 504*  --    Basic Metabolic Panel:  Recent Labs Lab 08/14/16 1539 08/14/16 1611  NA 135 135  K 3.4* 3.4*  CL 98* 95*  CO2 24  --   GLUCOSE 102* 105*  BUN 32* 30*  CREATININE 1.61* 1.50*  CALCIUM 8.4*  --    GFR: Estimated Creatinine Clearance: 51.1 mL/min (A) (by C-G formula based on SCr of 1.5 mg/dL (H)). Liver Function Tests:  Recent Labs Lab 08/14/16 1539  AST 20  ALT 12*  ALKPHOS 128*  BILITOT 0.7  PROT 8.0  ALBUMIN 2.6*   No results for input(s): LIPASE, AMYLASE in the last 168 hours. No results for input(s): AMMONIA in the last 168 hours. Coagulation Profile: No results for input(s): INR, PROTIME in the last 168 hours. Cardiac Enzymes: No results for input(s): CKTOTAL, CKMB, CKMBINDEX, TROPONINI in the last 168 hours. BNP (last 3 results) No results for input(s): PROBNP in the last 8760 hours. HbA1C: No results for input(s): HGBA1C in the last 72 hours. CBG: No results for input(s): GLUCAP in the last 168 hours. Lipid Profile: No results for input(s): CHOL, HDL, LDLCALC, TRIG, CHOLHDL, LDLDIRECT in the last 72 hours. Thyroid Function Tests: No results for input(s): TSH, T4TOTAL, FREET4, T3FREE, THYROIDAB in the last 72 hours. Anemia Panel: No results for input(s): VITAMINB12, FOLATE, FERRITIN, TIBC, IRON, RETICCTPCT in the last 72 hours. Urine analysis:  Sepsis Labs: @LABRCNTIP (procalcitonin:4,lacticidven:4) )No results found for this or any previous visit (from the past 240  hour(s)).   Radiological Exams on Admission: No results found.  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 We will however continue this until patient is seen by surgery and determines the next step in management - Continue pain management efforts  Active Problems:   Depression with anxiety - Continue Xanax, gabapentin    Benign essential HTN - Blood pressure medication on hold  due to soft blood pressure    Acute renal failure superimposed on chronic kidney disease stage III   - Recent creatinine 1.48 - Creatinine on this admission within baseline values    Anemia of chronic kidney disease   - Monitor daily CBC     Hypokalemia  - Potassium supplemented     DVT prophylaxis: SCDs Code Status: Full code  Family Communication: Friend at the bedside  Disposition Plan: Admission to telemetry  Consults called: Surgery  Disposition plan: Further plan will depend as patient's clinical course evolves and further radiologic and laboratory data become available.    At the time of admission, it appears that the appropriate admission status for this patient is INPATIENT .Thisis judged to be reasonable and necessary in order to provide the required intensity of service to ensure the patient's safetygiven the patient presentation of toxic appearing severe infection of the left leg concerning for pyoderma gangrenosum and potentially requiring not only incision and debridement but also possible amputation in addition to radiographic and laboratory data in the context of chronic comorbidities.   Manson PasseyAlma Megen Madewell MD [facility] Pager [phone]  If 7PM-7AM, please contact night-coverage [email or web address] Password [email or web address]  08/14/2016, 4:47 PM

## 2016-08-14 NOTE — ED Triage Notes (Signed)
[facility] x couple weeks.  Patient also has abd pressure sore. Patient tripped and fell catching herself today and between that and infection, why she called out EMS.

## 2016-08-14 NOTE — ED Notes (Signed)
RN unavaliable to get report

## 2016-08-14 NOTE — Progress Notes (Signed)
Pharmacy Antibiotic Note  Rita FieldingSally J Watkins is a 61 y.o. female admitted on 08/14/2016 with wound infection.  Pharmacy has been consulted for Vancomycin and Zosyn dosing.  Plan:  Vancomycin 2250mg  IV x 1, then 1250mg  IV q24h.  Plan for Vancomycin trough level at steady state.  Zosyn 3.375g IV x 1 over 30 minutes, then Zosyn 3.375g IV q8h (infuse over 4 hours).  Monitor renal function (daily SCr), cultures, clinical course.   Height: 5\' 9"  (175.3 cm) Weight: 240 lb (108.9 kg) IBW/kg (Calculated) : 66.2  Temp (24hrs), Avg:98.5 F (36.9 C), Min:98.5 F (36.9 C), Max:98.5 F (36.9 C)   Recent Labs Lab 08/14/16 1539 08/14/16 1550 08/14/16 1611  WBC 20.0*  --   --   CREATININE  --   --  1.50*  LATICACIDVEN  --  1.78  --     Estimated Creatinine Clearance: 51.1 mL/min (A) (by C-G formula based on SCr of 1.5 mg/dL (H)).    Allergies  Allergen Reactions  . Avelox [Moxifloxacin Hcl In Nacl] Anaphylaxis  . Terbinafine Hcl Swelling  . Zocor [Simvastatin - High Dose] Anaphylaxis  . Montelukast Sodium Other (See Comments)    Insomnia  . Pravastatin Other (See Comments)    Myalgias on 20mg     Antimicrobials this admission: 7/9 >> Vancomycin >> 7/9 >> Zosyn >>  Dose adjustments this admission: --  Microbiology results: 7/9 BCx: sent   Thank you for allowing pharmacy to be a part of this patient's care.   Greer PickerelJigna Landry Lookingbill, PharmD, BCPS Pager: [phone] 08/14/2016 3:01 PM

## 2016-08-14 NOTE — Telephone Encounter (Signed)
This message is for [facility] and Dr. Ardelle AntonWagoner. I am a friend of Cheral AlmasSally Sherlin and I have been bringing her to her appointments. I wanted to call and let you know that we had to call an Ambulance to come [facility] and she should be there by now. If you want more information, you can give me a call at [phone]. She does have an appointment with you tomorrow but it will need to be cancelled.

## 2016-08-15 ENCOUNTER — Ambulatory Visit: Payer: Self-pay | Admitting: Infectious Disease

## 2016-08-15 ENCOUNTER — Encounter (HOSPITAL_COMMUNITY): Payer: Self-pay

## 2016-08-15 ENCOUNTER — Ambulatory Visit: Payer: Self-pay | Admitting: Podiatry

## 2016-08-15 DIAGNOSIS — Z79899 Other long term (current) drug therapy: Secondary | ICD-10-CM

## 2016-08-15 DIAGNOSIS — S81802S Unspecified open wound, left lower leg, sequela: Secondary | ICD-10-CM

## 2016-08-15 DIAGNOSIS — Z825 Family history of asthma and other chronic lower respiratory diseases: Secondary | ICD-10-CM

## 2016-08-15 DIAGNOSIS — Z9109 Other allergy status, other than to drugs and biological substances: Secondary | ICD-10-CM

## 2016-08-15 DIAGNOSIS — F329 Major depressive disorder, single episode, unspecified: Secondary | ICD-10-CM

## 2016-08-15 DIAGNOSIS — Z8249 Family history of ischemic heart disease and other diseases of the circulatory system: Secondary | ICD-10-CM

## 2016-08-15 DIAGNOSIS — F17210 Nicotine dependence, cigarettes, uncomplicated: Secondary | ICD-10-CM

## 2016-08-15 DIAGNOSIS — F419 Anxiety disorder, unspecified: Secondary | ICD-10-CM

## 2016-08-15 DIAGNOSIS — Z8349 Family history of other endocrine, nutritional and metabolic diseases: Secondary | ICD-10-CM

## 2016-08-15 LAB — COMPREHENSIVE METABOLIC PANEL
ALK PHOS: 107 U/L (ref 38–126)
ALT: 10 U/L — ABNORMAL LOW (ref 14–54)
ANION GAP: 12 (ref 5–15)
AST: 16 U/L (ref 15–41)
Albumin: 2 g/dL — ABNORMAL LOW (ref 3.5–5.0)
BUN: 30 mg/dL — ABNORMAL HIGH (ref 6–20)
CALCIUM: 8.2 mg/dL — AB (ref 8.9–10.3)
CO2: 26 mmol/L (ref 22–32)
CREATININE: 1.55 mg/dL — AB (ref 0.44–1.00)
Chloride: 99 mmol/L — ABNORMAL LOW (ref 101–111)
GFR, EST AFRICAN AMERICAN: 40 mL/min — AB (ref >60)
GFR, EST NON AFRICAN AMERICAN: 35 mL/min — AB (ref >60)
Glucose, Bld: 99 mg/dL (ref 65–99)
Potassium: 3.4 mmol/L — ABNORMAL LOW (ref 3.5–5.1)
SODIUM: 137 mmol/L (ref 135–145)
Total Bilirubin: 0.8 mg/dL (ref 0.3–1.2)
Total Protein: 6.4 g/dL — ABNORMAL LOW (ref 6.5–8.1)

## 2016-08-15 LAB — CBC
HCT: 22 % — ABNORMAL LOW (ref 36.0–46.0)
HEMOGLOBIN: 7 g/dL — AB (ref 12.0–15.0)
MCH: 26.2 pg (ref 26.0–34.0)
MCHC: 31.8 g/dL (ref 30.0–36.0)
MCV: 82.4 fL (ref 78.0–100.0)
PLATELETS: 411 10*3/uL — AB (ref 150–400)
RBC: 2.67 MIL/uL — AB (ref 3.87–5.11)
RDW: 14.6 % (ref 11.5–15.5)
WBC: 17 10*3/uL — AB (ref 4.0–10.5)

## 2016-08-15 LAB — HIV ANTIBODY (ROUTINE TESTING W REFLEX): HIV SCREEN 4TH GENERATION: NONREACTIVE

## 2016-08-15 LAB — GLUCOSE, CAPILLARY: GLUCOSE-CAPILLARY: 88 mg/dL (ref 65–99)

## 2016-08-15 MED ORDER — CLARITHROMYCIN 250 MG PO TABS
500.0000 mg | ORAL_TABLET | Freq: Two times a day (BID) | ORAL | Status: DC
  Administered 2016-08-15 – 2016-08-16 (×3): 500 mg via ORAL
  Filled 2016-08-15 (×3): qty 2

## 2016-08-15 MED ORDER — DEXTROSE 5 % IV SOLN
2.0000 g | Freq: Four times a day (QID) | INTRAVENOUS | Status: DC
  Administered 2016-08-15 – 2016-08-16 (×3): 2 g via INTRAVENOUS
  Filled 2016-08-15 (×5): qty 2

## 2016-08-15 MED ORDER — CEFOXITIN SODIUM 2 G IV SOLR: 8.0000 g | INTRAVENOUS | Status: DC

## 2016-08-15 MED ORDER — DEXTROSE 5 % IV SOLN
21.0000 mg/kg | INTRAVENOUS | Status: DC
  Administered 2016-08-15 – 2016-08-16 (×2): 1725 mg via INTRAVENOUS
  Filled 2016-08-15 (×2): qty 6.9

## 2016-08-15 MED ORDER — OXYCODONE-ACETAMINOPHEN 5-325 MG PO TABS
1.0000 | ORAL_TABLET | Freq: Four times a day (QID) | ORAL | Status: DC | PRN
  Administered 2016-08-16: 1 via ORAL
  Filled 2016-08-15: qty 1

## 2016-08-15 MED ORDER — METHYLPREDNISOLONE SODIUM SUCC 125 MG IJ SOLR
125.0000 mg | Freq: Four times a day (QID) | INTRAMUSCULAR | Status: DC
  Administered 2016-08-15 – 2016-08-16 (×6): 125 mg via INTRAVENOUS
  Filled 2016-08-15 (×6): qty 2

## 2016-08-15 MED ORDER — DEXTROSE 5 % IV SOLN: 2.0000 g | INTRAVENOUS | Status: DC

## 2016-08-15 NOTE — Progress Notes (Signed)
ABIs will be completed first thing 08/16/2016

## 2016-08-15 NOTE — Consult Note (Signed)
[facility]       Reason for Consult: soft tissue infection    Referring Physician: Dr. Rhona Leavens  Principal Problem:   Pyoderma gangrenosa Active Problems:   Depression with anxiety   Leukocytosis   Benign essential HTN   . clarithromycin  500 mg Oral Q12H  . methylPREDNISolone (SOLU-MEDROL) injection  125 mg Intravenous Q6H  . sodium chloride flush  3 mL Intravenous Q12H  . vitamin C  500 mg Oral Daily    Recommendations: Start methylprednisone for possible pyoderma gangrenosum  I will start therapy for possible Mycobacterial disease (fortuitum, choloneae, abscessus) Will need close monitoring of renal function on antibiotics ANCA, ANA, RF, APS labs   Assessment: She has a severe necrotizing disease that is spreading and now on abdomen.  Though Mycobactial disease such as above can cause severe disease like this I am worried about PG more and feel her best course of action is to try steroids.  She may also benefit being in a center where they have inpatient dermatology.      I discussed with the patient and caretaker at the bedside that without a definitive diagnosis I think our best option is to try both steroids and antibiotics even with the knowledge that if it is an infection, steroids may make it worse.  They agree with the plan.   Hepatitis C is negative, will check for PG associated causes  Antibiotics: Vancomycin and zosyn  HPI: Rita Watkins is a 61 y.o. female with above that has been progressing really since about February 2018.  She has been followed by my partner Dr. Daiva Eves who has attempted to get her IV therapy for possible Mycobacterial disease but the patient has been limited due to cost concerns.  She has had multiple cultures and pathology evaluations and no specific findings. She has required toe amputation by Dr. Ardelle Anton.  Her wounds have appeared in different places on the leg but now contiguous.  No fever, no chills.  She has a  significant amount of pain.    Review of Systems:  Constitutional: negative for fevers and chills Gastrointestinal: negative for diarrhea and constipation All other systems reviewed and are negative    Past Medical History:  Diagnosis Date  . Allergy   . Anxiety   . Asthma   . COPD (chronic obstructive pulmonary disease) (HCC)   . Depression   . Dizziness 07/19/2016  . Hyperlipidemia   . Hypertension   . Lymphangitis 06/07/2016  . 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 Nausea 07/19/2016  . Necrosis (HCC) 07/19/2016    Social History  Substance Use Topics  . Smoking status: Current Every Day Smoker    Packs/day: 1.00    Years: 35.00    Types: Cigarettes  . Smokeless tobacco: Never Used  . Alcohol use No    Family History  Problem Relation Age of Onset  . Hypertension Mother   . Hyperlipidemia Mother   . Hypertension Brother   . Asthma Brother     Allergies  Allergen Reactions  . Avelox [Moxifloxacin Hcl In Nacl] Anaphylaxis  . Terbinafine Hcl Swelling  . Zocor [Simvastatin - High Dose] Anaphylaxis  . Montelukast Sodium Other (See Comments)    Insomnia  . Pravastatin Other (See Comments)    Myalgias on 20mg     Physical Exam: Constitutional: chronically ill appearing, distressed with pain Vitals:   08/14/16 2113 08/15/16 0453  BP:  (!) 95/43  Pulse: 72 68  Resp:  16  Temp:  98.8 F (37.1 C)   EYES: anicteric ENMT:no thrush Cardiovascular: Cor RRR Respiratory: clear; Musculoskeletal: leg viewed and c/w pictures - necrotic appearing areas, new area developing on lateral side GI: areas on abdomen developing Skin: negatives: no rash otherwise   Lab Results  Component Value Date   WBC 17.0 (H) 08/15/2016   HGB 7.0 (L) 08/15/2016   HCT 22.0 (L) 08/15/2016   MCV 82.4 08/15/2016   PLT 411 (H) 08/15/2016    Lab Results  Component Value Date   CREATININE 1.55 (H) 08/15/2016   BUN 30 (H) 08/15/2016   NA 137 08/15/2016   K 3.4 (L) 08/15/2016    CL 99 (L) 08/15/2016   CO2 26 08/15/2016    Lab Results  Component Value Date   ALT 10 (L) 08/15/2016   AST 16 08/15/2016   ALKPHOS 107 08/15/2016     Microbiology: Recent Results (from the past 240 hour(s))  Blood culture (routine x 2)     Status: None (Preliminary result)   Collection Time: 08/14/16  3:39 PM  Result Value Ref Range Status   Specimen Description BLOOD LEFT FOREARM  Final   Special Requests   Final    BOTTLES DRAWN AEROBIC AND ANAEROBIC Blood Culture adequate volume   Culture   Final    NO GROWTH < 24 HOURS Performed at [facility], [address] [place], [place] [address]    Report Status PENDING  Incomplete  Blood culture (routine x 2)     Status: None (Preliminary result)   Collection Time: 08/14/16  3:39 PM  Result Value Ref Range Status   Specimen Description BLOOD RIGHT FOREARM  Final   Special Requests IN PEDIATRIC BOTTLE Blood Culture adequate volume  Final   Culture   Final    NO GROWTH < 24 HOURS Performed at [facility], [address] [place], [place] [address]    Report Status PENDING  Incomplete    Hipolito Martinezlopez, Molly MaduroOBERT, MD [email or web address] [phone] pager  [phone] cell 08/15/2016, 4:01 PM

## 2016-08-15 NOTE — Progress Notes (Signed)
PT Cancellation Note  Patient Details Name: Rita FieldingSally J Watkins MRN: [id number] DOB: 1955/12/04   Cancelled Treatment:    Reason Eval/Treat Not Completed: Patient not medically ready (has surgery consult, has infection. will await medical stability and  clearance to mobilize. )   Rada HayHill, Preciosa Bundrick Elizabeth 08/15/2016, 7:37 AM Blanchard KelchKaren Tagen Milby PT [phone]

## 2016-08-15 NOTE — Consult Note (Signed)
[facility] Surgery Consult Note  Rita Watkins [facility] 1955-04-26  [id number].    Requesting MD: Wyline Copas Chief Complaint/Reason for Consult: left leg wound  HPI:  Rita Watkins is a 61yo female admitted to [facility] yesterday with worsening left leg wound. Patient states that this wound started around 02/2016; she had a pedicure 01/2016 and subsequently dropped a water bottle on her toe a week later that lead to infection of her toe which spread up her extremity. She has since undergone debridement of left thigh wound and soft tissue biopsies for culture 06/09/16 by Dr. Marla Roe and 06/27/16 by Dr. Kae Heller. AFB stains, cultures, and and surgical pathology were negative. ID was following Ms. Diprima and attempted to discharge patient on IV antibiotics for possible mycobacterium but she did not have insurance nor did she quality for charity care; she was discharged on oral clarithromycin and linezolid. Patient states that she completed the antibiotics in June and has since not been on any medications. She has been followed by Dr. Tommy Medal in clinic. Last visit 07/19/16 they considered starting a course of steroids for possible pyoderma gangrenosum, but I do not believe that she ever took this medication.  Also since her last admission she underwent left 4th toe ampuation 07/27/2016 by Dr. Jacqualyn Posey. Surgical pathology reported acute inflammation and ulceration with focal osteomyelitis, but I do not see where the toe was send for AFB and fungal cultures. Patient states that she decided to come to the ED because the LLE caused her to fall, and she "has had enough."  PMH significant for HTN, anxiety, CKD  ROS: Review of Systems  Constitutional: Negative.   HENT: Negative.   Eyes: Negative.   Respiratory: Negative.   Cardiovascular: Negative.   Gastrointestinal: Negative.   Genitourinary: Negative.   Musculoskeletal: Positive for falls and joint pain.       LLE pain  Skin:       LLE wound  Neurological: Negative.    All systems reviewed and otherwise negative except for as above  Family History  Problem Relation Age of Onset  . Hypertension Mother   . Hyperlipidemia Mother   . Hypertension Brother   . Asthma Brother     Past Medical History:  Diagnosis Date  . Allergy   . Anxiety   . Asthma   . COPD (chronic obstructive pulmonary disease) ([place])   . Depression   . Dizziness 07/19/2016  . Hyperlipidemia   . Hypertension   . Lymphangitis 06/07/2016  . 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 Nausea 07/19/2016  . Necrosis ([place]) 07/19/2016    Past Surgical History:  Procedure Laterality Date  . FOOT SURGERY     Left  . HIP PINNING     3 each hip  . IRRIGATION AND DEBRIDEMENT ABSCESS N/A 06/27/2016   Procedure: IRRIGATION AND DEBRIDEMENT LEFT LEG WITH SOTFT TISSUE BIOPSY;  Surgeon: Clovis Riley, MD;  Location: [facility] ORS;  Service: General;  Laterality: N/A;  . LYMPH NODE BIOPSY Left 06/09/2016   Procedure: BIOPSY LEFT LEG;  Surgeon: Wallace Going, DO;  Location: [facility] ORS;  Service: Plastics;  Laterality: Left;  . SLIPPED CAPITAL FEMORAL EPIPHYSIS PINNING      Social History:  reports that she has been smoking Cigarettes.  She has a 35.00 pack-year smoking history. She has never used smokeless tobacco. She reports that she does not drink alcohol or use drugs.  Allergies:  Allergies  Allergen Reactions  . Avelox [Moxifloxacin Hcl In Nacl] Anaphylaxis  .  Terbinafine Hcl Swelling  . Zocor [Simvastatin - High Dose] Anaphylaxis  . Montelukast Sodium Other (See Comments)    Insomnia  . Pravastatin Other (See Comments)    Myalgias on 39m    Medications Prior to Admission  Medication Sig Dispense Refill  . ALPRAZolam (XANAX) 0.5 MG tablet Take 1 tablet (0.5 mg total) by mouth 2 (two) times daily as needed for anxiety. 20 tablet 0  . losartan-hydrochlorothiazide (HYZAAR) 100-12.5 MG tablet Take 1 tablet by mouth daily. 90 tablet 1  . oxyCODONE-acetaminophen (PERCOCET/ROXICET)  5-325 MG tablet Take 1-2 tablets by mouth every 6 (six) hours as needed for severe pain. 30 tablet 0  . vitamin C (ASCORBIC ACID) 500 MG tablet Take 500 mg by mouth daily.    .Marland Kitchenacetaminophen (TYLENOL) 325 MG tablet Take 650 mg by mouth every 6 (six) hours as needed for moderate pain.     . clarithromycin (BIAXIN) 500 MG tablet Take 1 tablet (500 mg total) by mouth every 12 (twelve) hours. (Patient not taking: Reported on 08/14/2016) 60 tablet 0  . gabapentin (NEURONTIN) 300 MG capsule Take 1 capsule (300 mg total) by mouth at bedtime. (Patient not taking: Reported on 07/19/2016) 90 capsule 3  . ibuprofen (ADVIL,MOTRIN) 200 MG tablet Take 800 mg by mouth every 6 (six) hours as needed for moderate pain.     .Marland Kitchenlinezolid (ZYVOX) 600 MG tablet Take 1 tablet (600 mg total) by mouth every 12 (twelve) hours. (Patient not taking: Reported on 08/14/2016) 60 tablet 3  . ondansetron (ZOFRAN) 4 MG tablet Take 1 tablet (4 mg total) by mouth every 8 (eight) hours as needed for nausea or vomiting. 90 tablet 0  . oxyCODONE (OXY IR/ROXICODONE) 5 MG immediate release tablet Take 1 tablet (5 mg total) by mouth every 6 (six) hours as needed for severe pain or breakthrough pain. (Patient not taking: Reported on 07/19/2016) 20 tablet 0    Prior to Admission medications   Medication Sig Start Date End Date Taking? Authorizing Provider  ALPRAZolam (Duanne Moron 0.5 MG tablet Take 1 tablet (0.5 mg total) by mouth 2 (two) times daily as needed for anxiety. 06/30/16  Yes MTheodis Blaze MD  losartan-hydrochlorothiazide ([facility] 100-12.5 MG tablet Take 1 tablet by mouth daily. 04/05/16  Yes Saguier, EPercell Miller PA-C  oxyCODONE-acetaminophen (PERCOCET/ROXICET) 5-325 MG tablet Take 1-2 tablets by mouth every 6 (six) hours as needed for severe pain. 08/01/16  Yes WTrula Slade DPM  vitamin C (ASCORBIC ACID) 500 MG tablet Take 500 mg by mouth daily.   Yes [provider]  acetaminophen (TYLENOL) 325 MG tablet Take 650 mg by mouth every  6 (six) hours as needed for moderate pain.     [provider]  clarithromycin (BIAXIN) 500 MG tablet Take 1 tablet (500 mg total) by mouth every 12 (twelve) hours. Patient not taking: Reported on 08/14/2016 06/30/16   MTheodis Blaze MD  gabapentin (NEURONTIN) 300 MG capsule Take 1 capsule (300 mg total) by mouth at bedtime. Patient not taking: Reported on 07/19/2016 05/02/16   WTrula Slade DPM  ibuprofen (ADVIL,MOTRIN) 200 MG tablet Take 800 mg by mouth every 6 (six) hours as needed for moderate pain.     [provider]  linezolid (ZYVOX) 600 MG tablet Take 1 tablet (600 mg total) by mouth every 12 (twelve) hours. Patient not taking: Reported on 08/14/2016 07/05/16   VTommy Medal CLavell Islam MD  ondansetron (ZOFRAN) 4 MG tablet Take 1 tablet (4 mg total) by mouth  every 8 (eight) hours as needed for nausea or vomiting. 07/14/16   Truman Hayward, MD  oxyCODONE (OXY IR/ROXICODONE) 5 MG immediate release tablet Take 1 tablet (5 mg total) by mouth every 6 (six) hours as needed for severe pain or breakthrough pain. Patient not taking: Reported on 07/19/2016 06/30/16   Izora Gala A, PA-C    Blood pressure (!) 95/43, pulse 68, temperature 98.8 F (37.1 C), temperature source Axillary, resp. rate 16, height 5' 9"  (1.753 m), weight 234 lb 9.1 oz (106.4 kg), SpO2 91 %. Physical Exam: General: WD/WN white female who is laying in bed in NAD with depressed affect  HEENT: head is normocephalic, atraumatic.  Sclera are noninjected.  Pupils equal and round.  Ears and nose without any masses or lesions.  Mouth is pink and moist. Dentition fair Heart: regular, rate, and rhythm.  No obvious murmurs, gallops, or rubs noted.  Lungs: CTAB, no wheezes, rhonchi, or rales noted.  Respiratory effort nonlabored Abd: soft, NT/ND, +BS, no masses, hernias, or organomegaly. Small area of ulcerated skin in the left lower abdomen with no fluctuance or active drainage Psych: A&Ox3 with a depressed  affect. Neuro: cranial nerves grossly intact, extremity CSM intact bilaterally, normal speech Skin: warm and dry with no other masses, lesions, or rashes LLE: foot WWP with 2+ pitting edema, motor function intact, unable to palpate DP pulse. Foot is s/p 4th toe amputation with sutures intact and foul smelling drainage, 2 toes more medial with black discoloration at tips. There is extensive skin necrosis and on the medial distal thigh extending to the midshaft medial and anterior lower leg; majority of wound with eschar but there are 2 areas with deep wound, purulent drainage, and foul smelling odor   Results for orders placed or performed during the hospital encounter of 08/14/16 (from the past 48 hour(s))  CBC with Differential     Status: Abnormal   Collection Time: 08/14/16  3:39 PM  Result Value Ref Range   WBC 20.0 (H) 4.0 - 10.5 K/uL   RBC 2.91 (L) 3.87 - 5.11 MIL/uL   Hemoglobin 7.7 (L) 12.0 - 15.0 g/dL   HCT 23.7 (L) 36.0 - 46.0 %   MCV 81.4 78.0 - 100.0 fL   MCH 26.5 26.0 - 34.0 pg   MCHC 32.5 30.0 - 36.0 g/dL   RDW 14.4 11.5 - 15.5 %   Platelets 504 (H) 150 - 400 K/uL   Neutrophils Relative % 76 %   Neutro Abs 15.2 (H) 1.7 - 7.7 K/uL   Lymphocytes Relative 13 %   Lymphs Abs 2.5 0.7 - 4.0 K/uL   Monocytes Relative 10 %   Monocytes Absolute 2.0 (H) 0.1 - 1.0 K/uL   Eosinophils Relative 1 %   Eosinophils Absolute 0.2 0.0 - 0.7 K/uL   Basophils Relative 0 %   Basophils Absolute 0.0 0.0 - 0.1 K/uL  Comprehensive metabolic panel     Status: Abnormal   Collection Time: 08/14/16  3:39 PM  Result Value Ref Range   Sodium 135 135 - 145 mmol/L   Potassium 3.4 (L) 3.5 - 5.1 mmol/L   Chloride 98 (L) 101 - 111 mmol/L   CO2 24 22 - 32 mmol/L   Glucose, Bld 102 (H) 65 - 99 mg/dL   BUN 32 (H) 6 - 20 mg/dL   Creatinine, Ser 1.61 (H) 0.44 - 1.00 mg/dL   Calcium 8.4 (L) 8.9 - 10.3 mg/dL   Total Protein 8.0 6.5 - 8.1 g/dL  Albumin 2.6 (L) 3.5 - 5.0 g/dL   AST 20 15 - 41 U/L   ALT 12  (L) 14 - 54 U/L   Alkaline Phosphatase 128 (H) 38 - 126 U/L   Total Bilirubin 0.7 0.3 - 1.2 mg/dL   GFR calc non Af Amer 33 (L) >60 mL/min   GFR calc Af Amer 39 (L) >60 mL/min    Comment: (NOTE) The eGFR has been calculated using the CKD EPI equation. This calculation has not been validated in all clinical situations. eGFR's persistently <60 mL/min signify possible Chronic Kidney Disease.    Anion gap 13 5 - 15  Blood culture (routine x 2)     Status: None (Preliminary result)   Collection Time: 08/14/16  3:39 PM  Result Value Ref Range   Specimen Description BLOOD LEFT FOREARM    Special Requests      BOTTLES DRAWN AEROBIC AND ANAEROBIC Blood Culture adequate volume   Culture      NO GROWTH < 24 HOURS Performed at [facility], [place] [address] [place], [place] [address]    Report Status PENDING   Blood culture (routine x 2)     Status: None (Preliminary result)   Collection Time: 08/14/16  3:39 PM  Result Value Ref Range   Specimen Description BLOOD RIGHT FOREARM    Special Requests IN PEDIATRIC BOTTLE Blood Culture adequate volume    Culture      NO GROWTH < 24 HOURS Performed at [facility] [address] [place],  [address]    Report Status PENDING   I-Stat CG4 Lactic Acid, ED     Status: None   Collection Time: 08/14/16  3:50 PM  Result Value Ref Range   Lactic Acid, Venous 1.78 0.5 - 1.9 mmol/L  I-Stat Chem 8, ED     Status: Abnormal   Collection Time: 08/14/16  4:11 PM  Result Value Ref Range   Sodium 135 135 - 145 mmol/L   Potassium 3.4 (L) 3.5 - 5.1 mmol/L   Chloride 95 (L) 101 - 111 mmol/L   BUN 30 (H) 6 - 20 mg/dL   Creatinine, Ser 1.50 (H) 0.44 - 1.00 mg/dL   Glucose, Bld 105 (H) 65 - 99 mg/dL   Calcium, Ion 1.06 (L) 1.15 - 1.40 mmol/L   TCO2 26 0 - 100 mmol/L   Hemoglobin 8.5 (L) 12.0 - 15.0 g/dL   HCT 25.0 (L) 36.0 - 46.0 %  Comprehensive metabolic panel     Status: Abnormal   Collection Time: 08/15/16  7:17 AM  Result Value Ref  Range   Sodium 137 135 - 145 mmol/L   Potassium 3.4 (L) 3.5 - 5.1 mmol/L   Chloride 99 (L) 101 - 111 mmol/L   CO2 26 22 - 32 mmol/L   Glucose, Bld 99 65 - 99 mg/dL   BUN 30 (H) 6 - 20 mg/dL   Creatinine, Ser 1.55 (H) 0.44 - 1.00 mg/dL   Calcium 8.2 (L) 8.9 - 10.3 mg/dL   Total Protein 6.4 (L) 6.5 - 8.1 g/dL   Albumin 2.0 (L) 3.5 - 5.0 g/dL   AST 16 15 - 41 U/L   ALT 10 (L) 14 - 54 U/L   Alkaline Phosphatase 107 38 - 126 U/L   Total Bilirubin 0.8 0.3 - 1.2 mg/dL   GFR calc non Af Amer 35 (L) >60 mL/min   GFR calc Af Amer 40 (L) >60 mL/min    Comment: (NOTE) The  eGFR has been calculated using the CKD EPI equation. This calculation has not been validated in all clinical situations. eGFR's persistently <60 mL/min signify possible Chronic Kidney Disease.    Anion gap 12 5 - 15  CBC     Status: Abnormal   Collection Time: 08/15/16  7:17 AM  Result Value Ref Range   WBC 17.0 (H) 4.0 - 10.5 K/uL   RBC 2.67 (L) 3.87 - 5.11 MIL/uL   Hemoglobin 7.0 (L) 12.0 - 15.0 g/dL   HCT 22.0 (L) 36.0 - 46.0 %   MCV 82.4 78.0 - 100.0 fL   MCH 26.2 26.0 - 34.0 pg   MCHC 31.8 30.0 - 36.0 g/dL   RDW 14.6 11.5 - 15.5 %   Platelets 411 (H) 150 - 400 K/uL  Glucose, capillary     Status: None   Collection Time: 08/15/16  7:47 AM  Result Value Ref Range   Glucose-Capillary 88 65 - 99 mg/dL   No results found.  Anti-infectives    Start     Dose/Rate Route Frequency Ordered Stop   08/15/16 1600  vancomycin (VANCOCIN) 1,250 mg in sodium chloride 0.9 % 250 mL IVPB     1,250 mg 166.7 mL/hr over 90 Minutes Intravenous Every 24 hours 08/14/16 1624     08/14/16 2200  piperacillin-tazobactam (ZOSYN) IVPB 3.375 g     3.375 g 12.5 mL/hr over 240 Minutes Intravenous Every 8 hours 08/14/16 1623     08/14/16 1530  vancomycin (VANCOCIN) 2,250 mg in sodium chloride 0.9 % 500 mL IVPB     2,250 mg 250 mL/hr over 120 Minutes Intravenous STAT 08/14/16 1449 08/14/16 1859   08/14/16 1500  piperacillin-tazobactam  (ZOSYN) IVPB 3.375 g     3.375 g 100 mL/hr over 30 Minutes Intravenous STAT 08/14/16 1447 08/14/16 1721       Assessment/Plan HTN Anxiety CKD  Left leg soft tissue infection, possible pyoderma gangrenosa - unsure if OR debridement is the best first option, could require amputation. Agree with IV antibiotics. Will obtain ABI's. Recommend vascular and ID consults. May need more advanced imaging (?CTA, ?MRI). Will discuss with MD.  ID - zosyn/vancomycin 7/9>> VTE - SCDs FEN - regular diet  Jerrye Beavers, [facility] 08/15/2016, 11:44 AM Pager: [phone] Consults: [phone] Mon-Fri 7:00 am-4:30 pm Sat-Sun 7:00 am-11:30 am

## 2016-08-15 NOTE — Progress Notes (Signed)
Pharmacy Antibiotic Note  Rita Watkins is a 61 y.o. female admitted on 08/14/2016 with for possible severe M abscesses/fortuitum/choloneae infection.  Pharmacy has been consulted for amikacin/cefoxitin dosing.  She has a severe necrotizing disease that is spreading and now on abdomen. Thought to be possible mycobactial disease as well as  Pyoderma gangrenosa. Has also been started on Solu-medrol.     Plan:  Cefoxitin 2 gr IV q6h  Amikacin 1725 mg IV q24h, using ABW of 82 kg and extended interval dose of 21 mg/kg  Clarithromycin 500 mg PO BID ( MD)  Consider increasing cefoxitin to q4h regimen if SCr improves.  Will obtain amikacin level 10 hours after dose to help guide therapy  Monitor clinical course, renal function, cultures as available    Height: 5\' 9"  (175.3 cm) Weight: 234 lb 9.1 oz (106.4 kg) IBW/kg (Calculated) : 66.2  Temp (24hrs), Avg:98.8 F (37.1 C), Min:97.8 F (36.6 C), Max:99.9 F (37.7 C)   Recent Labs Lab 08/14/16 1539 08/14/16 1550 08/14/16 1611 08/15/16 0717  WBC 20.0*  --   --  17.0*  CREATININE 1.61*  --  1.50* 1.55*  LATICACIDVEN  --  1.78  --   --     Estimated Creatinine Clearance: 48.9 mL/min (A) (by C-G formula based on SCr of 1.55 mg/dL (H)).    Allergies  Allergen Reactions  . Avelox [Moxifloxacin Hcl In Nacl] Anaphylaxis  . Terbinafine Hcl Swelling  . Zocor [Simvastatin - High Dose] Anaphylaxis  . Montelukast Sodium Other (See Comments)    Insomnia  . Pravastatin Other (See Comments)    Myalgias on 20mg     Antimicrobials this admission: 7/9 >> Vancomycin >> 7/10 7/9 >> Zosyn >> 7/10 7/10 >> amikacin >> 7/10 >> clarithromycin >> 7/10 >> cefoxitin >>  Dose adjustments this admission: 7/11 0330: amikacin level:   Microbiology results: 7/10 BCx: sent   Thank you for allowing pharmacy to be a part of this patient's care.   Adalberto ColeNikola Ellamay Fors, PharmD, BCPS Pager [phone] 08/15/2016 8:50 PM

## 2016-08-15 NOTE — Progress Notes (Signed)
PROGRESS NOTE    Rita Watkins  ZOX:[id number] DOB: [birth year] DOA: 08/14/2016 PCP: Marisue Brooklyn    Brief Narrative:  61 y.o. 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   Patient's history starts from January 2018 when she dropped bottle of water on her foot, has sustained chronic infection on her left toe which as noted above has worsened to the point of necrosis that she required amputation. Somewhere around April 2018 she started to develop redness and what seemed to be an abscess of the left knee medially which worsened so quickly to the point that she required few hospitalizations and apparently multiple cultures done before were not adequate to assess what exactly is going on. With the thought was that patient possibly has pyoderma gangrenosum. She finally underwent debridement and the cultures were taken but again the organism was not found per patient. She has had wound care at home. She has followed with Dr. Ardelle Anton of podiatry as well as with infectious disease. With presentation to the hospital is that she fell. Her friend went to check on her and when they looked at the left leg they felt that infection has exponentially grown over past week or so. Patient herself does not report any fevers or chills. She does say that her wound has gotten drastically worse over short period of time with foul smell, purulent drainage. Patient also has new lesion on the abdomen for past 2 weeks. She hasn't been able to schedule an appointment to take a look at that lesion.  Assessment & Plan:   Principal Problem:   Pyoderma gangrenosa Active Problems:   Depression with anxiety   Leukocytosis   Benign essential HTN  Principal Problem:   Pyoderma gangrenosa / Toxic appearing infection of the left leg / leukocytosis - large patch of necrotic  appearing tissues across inner aspect of L leg - Surgery and ID consulted - IV steroids started by ID - ABI's ordered. Pending - Concerns that pt will need incision and debridement and possibly even amputation - She was started on vancomycin and Zosyn - Continue pain management efforts  Active Problems:   Depression with anxiety - Continue Xanax, gabapentin - Appears to be stable at this time    Benign essential HTN - Blood pressure medication on hold due to soft blood pressure    Acute renal failure superimposed on chronic kidney disease stage III   - Presenting creatinine 1.48 - Cr currently 1.55 - Repeat BMET in  AM    Anemia of chronic kidney disease   - hemodynamically stable at this time - Repeat CBC in AM     Hypokalemia  - repeat bmet in AM - Continue to replace as needed   DVT prophylaxis: SCD's Code Status: Full Family Communication: Pt in room, family not at bedside Disposition Plan: Uncertain at this time  Consultants:   ID  General Surgery  Procedures:     Antimicrobials: Anti-infectives    Start     Dose/Rate Route Frequency Ordered Stop   08/16/16 0800  cefOXitin (MEFOXIN) 8 g in sodium chloride 0.9 % 210 mL continuous infusion  Status:  Discontinued     8 g 12.5 mL/hr over 20 Hours Intravenous Every 24 hours 08/15/16 1601 08/15/16 1636   08/15/16 2200  clarithromycin (BIAXIN) tablet 500 mg     500 mg Oral Every  12 hours 08/15/16 1601     08/15/16 1615  cefOXitin (MEFOXIN) 2 g in dextrose 5 % 50 mL IVPB  Status:  Discontinued     2 g 100 mL/hr over 30 Minutes Intravenous Every 4 hours 08/15/16 1601 08/15/16 1636   08/15/16 1600  vancomycin (VANCOCIN) 1,250 mg in sodium chloride 0.9 % 250 mL IVPB  Status:  Discontinued     1,250 mg 166.7 mL/hr over 90 Minutes Intravenous Every 24 hours 08/14/16 1624 08/15/16 1601   08/14/16 2200  piperacillin-tazobactam (ZOSYN) IVPB 3.375 g  Status:  Discontinued     3.375 g 12.5 mL/hr over 240 Minutes  Intravenous Every 8 hours 08/14/16 1623 08/15/16 1601   08/14/16 1530  vancomycin (VANCOCIN) 2,250 mg in sodium chloride 0.9 % 500 mL IVPB     2,250 mg 250 mL/hr over 120 Minutes Intravenous STAT 08/14/16 1449 08/14/16 1859   08/14/16 1500  piperacillin-tazobactam (ZOSYN) IVPB 3.375 g     3.375 g 100 mL/hr over 30 Minutes Intravenous STAT 08/14/16 1447 08/14/16 1721       Subjective: Without complaints  Objective: Vitals:   08/14/16 1905 08/14/16 2101 08/14/16 2113 08/15/16 0453  BP: (!) 98/52 (!) 101/56  (!) 95/43  Pulse: 68 (!) 148 72 68  Resp: 18 18  16   Temp: 97.8 F (36.6 C) 97.8 F (36.6 C)  98.8 F (37.1 C)  TempSrc: Oral Oral  Axillary  SpO2: 97% 99%  91%  Weight:    106.4 kg (234 lb 9.1 oz)  Height:        Intake/Output Summary (Last 24 hours) at 08/15/16 1641 Last data filed at 08/15/16 1505  Gross per 24 hour  Intake          2154.17 ml  Output                0 ml  Net          2154.17 ml   Filed Weights   08/14/16 1345 08/15/16 0453  Weight: 108.9 kg (240 lb) 106.4 kg (234 lb 9.1 oz)    Examination:  General exam: Appears calm and comfortable  Respiratory system: Clear to auscultation. Respiratory effort normal. Cardiovascular system: S1 & S2 heard, RRR. Gastrointestinal system: Abdomen is nondistended, soft and nontender. No organomegaly or masses felt. Normal bowel sounds heard. Central nervous system: Alert and oriented. No focal neurological deficits. Extremities: Symmetric 5 x 5 power. Skin: large patch of necrosis over inner-LLE, sanguinous drainage staining sheets Psychiatry: Judgement and insight appear normal. Mood & affect appropriate.   Data Reviewed: I have personally reviewed following labs and imaging studies  CBC:  Recent Labs Lab 08/14/16 1539 08/14/16 1611 08/15/16 0717  WBC 20.0*  --  17.0*  NEUTROABS 15.2*  --   --   HGB 7.7* 8.5* 7.0*  HCT 23.7* 25.0* 22.0*  MCV 81.4  --  82.4  PLT 504*  --  411*   Basic Metabolic  Panel:  Recent Labs Lab 08/14/16 1539 08/14/16 1611 08/15/16 0717  NA 135 135 137  K 3.4* 3.4* 3.4*  CL 98* 95* 99*  CO2 24  --  26  GLUCOSE 102* 105* 99  BUN 32* 30* 30*  CREATININE 1.61* 1.50* 1.55*  CALCIUM 8.4*  --  8.2*   GFR: Estimated Creatinine Clearance: 48.9 mL/min (A) (by C-G formula based on SCr of 1.55 mg/dL (H)). Liver Function Tests:  Recent Labs Lab 08/14/16 1539 08/15/16 0717  AST 20 16  ALT 12*  10*  ALKPHOS 128* 107  BILITOT 0.7 0.8  PROT 8.0 6.4*  ALBUMIN 2.6* 2.0*   No results for input(s): LIPASE, AMYLASE in the last 168 hours. No results for input(s): AMMONIA in the last 168 hours. Coagulation Profile: No results for input(s): INR, PROTIME in the last 168 hours. Cardiac Enzymes: No results for input(s): CKTOTAL, CKMB, CKMBINDEX, TROPONINI in the last 168 hours. BNP (last 3 results) No results for input(s): PROBNP in the last 8760 hours. HbA1C: No results for input(s): HGBA1C in the last 72 hours. CBG:  Recent Labs Lab 08/15/16 0747  GLUCAP 88   Lipid Profile: No results for input(s): CHOL, HDL, LDLCALC, TRIG, CHOLHDL, LDLDIRECT in the last 72 hours. Thyroid Function Tests: No results for input(s): TSH, T4TOTAL, FREET4, T3FREE, THYROIDAB in the last 72 hours. Anemia Panel: No results for input(s): VITAMINB12, FOLATE, FERRITIN, TIBC, IRON, RETICCTPCT in the last 72 hours. Sepsis Labs:  Recent Labs Lab 08/14/16 1550  LATICACIDVEN 1.78    Recent Results (from the past 240 hour(s))  Blood culture (routine x 2)     Status: None (Preliminary result)   Collection Time: 08/14/16  3:39 PM  Result Value Ref Range Status   Specimen Description BLOOD LEFT FOREARM  Final   Special Requests   Final    BOTTLES DRAWN AEROBIC AND ANAEROBIC Blood Culture adequate volume   Culture   Final    NO GROWTH < 24 HOURS Performed at [facility], [address] [place], [place] [address]    Report Status PENDING  Incomplete  Blood culture  (routine x 2)     Status: None (Preliminary result)   Collection Time: 08/14/16  3:39 PM  Result Value Ref Range Status   Specimen Description BLOOD RIGHT FOREARM  Final   Special Requests IN PEDIATRIC BOTTLE Blood Culture adequate volume  Final   Culture   Final    NO GROWTH < 24 HOURS Performed at [facility], [address] [place], [place] [address]    Report Status PENDING  Incomplete     Radiology Studies: No results found.  Scheduled Meds: . clarithromycin  500 mg Oral Q12H  . methylPREDNISolone (SOLU-MEDROL) injection  125 mg Intravenous Q6H  . sodium chloride flush  3 mL Intravenous Q12H  . vitamin C  500 mg Oral Daily   Continuous Infusions: . sodium chloride 50 mL/hr at 08/14/16 2100     LOS: 1 day   Kambrea Carrasco, Scheryl MartenSTEPHEN K, MD [facility] Pager [phone]  If 7PM-7AM, please contact night-coverage [email or web address] Password [email or web address] 08/15/2016, 4:41 PM

## 2016-08-16 ENCOUNTER — Inpatient Hospital Stay (HOSPITAL_COMMUNITY): Payer: Self-pay

## 2016-08-16 DIAGNOSIS — I70269 Atherosclerosis of native arteries of extremities with gangrene, unspecified extremity: Secondary | ICD-10-CM

## 2016-08-16 DIAGNOSIS — T8189XA Other complications of procedures, not elsewhere classified, initial encounter: Secondary | ICD-10-CM

## 2016-08-16 DIAGNOSIS — L88 Pyoderma gangrenosum: Principal | ICD-10-CM

## 2016-08-16 HISTORY — DX: Other complications of procedures, not elsewhere classified, initial encounter: T81.89XA

## 2016-08-16 LAB — CBC
HCT: 21.1 % — ABNORMAL LOW (ref 36.0–46.0)
Hemoglobin: 6.8 g/dL — CL (ref 12.0–15.0)
MCH: 26.5 pg (ref 26.0–34.0)
MCHC: 32.2 g/dL (ref 30.0–36.0)
MCV: 82.1 fL (ref 78.0–100.0)
PLATELETS: 402 10*3/uL — AB (ref 150–400)
RBC: 2.57 MIL/uL — AB (ref 3.87–5.11)
RDW: 14.5 % (ref 11.5–15.5)
WBC: 19.7 10*3/uL — ABNORMAL HIGH (ref 4.0–10.5)

## 2016-08-16 LAB — BASIC METABOLIC PANEL
Anion gap: 10 (ref 5–15)
BUN: 24 mg/dL — ABNORMAL HIGH (ref 6–20)
CHLORIDE: 101 mmol/L (ref 101–111)
CO2: 26 mmol/L (ref 22–32)
CREATININE: 1.44 mg/dL — AB (ref 0.44–1.00)
Calcium: 8.1 mg/dL — ABNORMAL LOW (ref 8.9–10.3)
GFR calc non Af Amer: 38 mL/min — ABNORMAL LOW (ref >60)
GFR, EST AFRICAN AMERICAN: 44 mL/min — AB (ref >60)
Glucose, Bld: 160 mg/dL — ABNORMAL HIGH (ref 65–99)
Potassium: 3.6 mmol/L (ref 3.5–5.1)
Sodium: 137 mmol/L (ref 135–145)

## 2016-08-16 LAB — PROTEIN ELECTROPHORESIS, SERUM
A/G Ratio: 0.5 — ABNORMAL LOW (ref 0.7–1.7)
ALPHA-1-GLOBULIN: 0.4 g/dL (ref 0.0–0.4)
ALPHA-2-GLOBULIN: 1 g/dL (ref 0.4–1.0)
Albumin ELP: 1.9 g/dL — ABNORMAL LOW (ref 2.9–4.4)
BETA GLOBULIN: 0.9 g/dL (ref 0.7–1.3)
GLOBULIN, TOTAL: 3.9 g/dL (ref 2.2–3.9)
Gamma Globulin: 1.5 g/dL (ref 0.4–1.8)
Total Protein ELP: 5.8 g/dL — ABNORMAL LOW (ref 6.0–8.5)

## 2016-08-16 LAB — PREPARE RBC (CROSSMATCH)

## 2016-08-16 LAB — ANCA TITERS: Atypical P-ANCA titer: <1:20 {titer}

## 2016-08-16 LAB — GLUCOSE, CAPILLARY: Glucose-Capillary: 172 mg/dL — ABNORMAL HIGH (ref 65–99)

## 2016-08-16 LAB — RHEUMATOID FACTOR: Rhuematoid fact SerPl-aCnc: 35.9 IU/mL — ABNORMAL HIGH (ref 0.0–13.9)

## 2016-08-16 LAB — ABO/RH: ABO/RH(D): O POS

## 2016-08-16 MED ORDER — METHYLPREDNISOLONE SODIUM SUCC 125 MG IJ SOLR: 125.0000 mg | Freq: Four times a day (QID) | INTRAMUSCULAR | 0 refills | Status: DC

## 2016-08-16 MED ORDER — SODIUM CHLORIDE 0.9 % IV SOLN: Freq: Once | INTRAVENOUS | Status: DC

## 2016-08-16 MED ORDER — HYDROMORPHONE HCL-NACL 0.5-0.9 MG/ML-% IV SOSY
1.0000 mg | PREFILLED_SYRINGE | Freq: Once | INTRAVENOUS | Status: AC
  Administered 2016-08-16: 1 mg via INTRAVENOUS
  Filled 2016-08-16: qty 2

## 2016-08-16 MED ORDER — DIPHENHYDRAMINE HCL 50 MG/ML IJ SOLN
12.5000 mg | Freq: Three times a day (TID) | INTRAMUSCULAR | Status: DC | PRN
  Administered 2016-08-16: 12.5 mg via INTRAVENOUS
  Filled 2016-08-16: qty 1

## 2016-08-16 MED ORDER — CEFOXITIN SODIUM 2 G IV SOLR
2.0000 g | INTRAVENOUS | Status: DC
  Administered 2016-08-16 (×3): 2 g via INTRAVENOUS
  Filled 2016-08-16 (×5): qty 2

## 2016-08-16 NOTE — Progress Notes (Signed)
[facility]   Reason for visit: Follow up on possible pyoderma gangrenosum  Interval History: started on steroids with antibiotics yesterday.  Some increase in pain with a tightening feeling.  No fever, no chills No associated n/v/d. No rashes Cefoxitin day 2 Amikacin day 2 Clarithromycin day 2 Solumedrol day 2  Physical Exam: Constitutional:  Vitals:   08/16/16 1029 08/16/16 1300  BP: (!) 93/39 104/61  Pulse: (!) 58 (!) 54  Resp: 16 16  Temp: 97.9 F (36.6 C) 98 F (36.7 C)   patient appears in NAD HENT: no thrush Respiratory: Normal respiratory effort; CTA B Cardiovascular: RRR MS: large wound on left leg appears more dry today  Review of Systems: Constitutional: negative for fevers and chills Respiratory: negative for cough or sputum Gastrointestinal: negative for diarrhea  Lab Results  Component Value Date   WBC 19.7 (H) 08/16/2016   HGB 6.8 (LL) 08/16/2016   HCT 21.1 (L) 08/16/2016   MCV 82.1 08/16/2016   PLT 402 (H) 08/16/2016    Lab Results  Component Value Date   CREATININE 1.44 (H) 08/16/2016   BUN 24 (H) 08/16/2016   NA 137 08/16/2016   K 3.6 08/16/2016   CL 101 08/16/2016   CO2 26 08/16/2016    Lab Results  Component Value Date   ALT 10 (L) 08/15/2016   AST 16 08/15/2016   ALKPHOS 107 08/15/2016     Microbiology: Recent Results (from the past 240 hour(s))  Blood culture (routine x 2)     Status: None (Preliminary result)   Collection Time: 08/14/16  3:39 PM  Result Value Ref Range Status   Specimen Description BLOOD LEFT FOREARM  Final   Special Requests   Final    BOTTLES DRAWN AEROBIC AND ANAEROBIC Blood Culture adequate volume   Culture   Final    NO GROWTH 2 DAYS Performed at [facility], [address] [place], [place] [address]    Report Status PENDING  Incomplete  Blood culture (routine x 2)     Status: None (Preliminary result)   Collection Time: 08/14/16  3:39 PM  Result Value Ref Range  Status   Specimen Description BLOOD RIGHT FOREARM  Final   Special Requests IN PEDIATRIC BOTTLE Blood Culture adequate volume  Final   Culture   Final    NO GROWTH 2 DAYS Performed at [facility], [address] [place], [place] [address]    Report Status PENDING  Incomplete  Culture, blood (routine x 2)     Status: None (Preliminary result)   Collection Time: 08/14/16  9:37 PM  Result Value Ref Range Status   Specimen Description BLOOD RIGHT ANTECUBITAL  Final   Special Requests IN PEDIATRIC BOTTLE Blood Culture adequate volume  Final   Culture   Final    NO GROWTH 1 DAY Performed at [facility], [address], [place], [place] [address]    Report Status PENDING  Incomplete  Culture, blood (routine x 2)     Status: None (Preliminary result)   Collection Time: 08/14/16  9:37 PM  Result Value Ref Range Status   Specimen Description BLOOD LEFT ANTECUBITAL  Final   Special Requests IN PEDIATRIC BOTTLE Blood Culture adequate volume  Final   Culture   Final    NO GROWTH 1 DAY Performed at [facility], [address], [place], [place] [address]    Report Status PENDING  Incomplete  Impression/Plan:  1. Possible pyoderma gangrenosum - may be some drying that hopefully is a sign of some improvement.  I feel she would best benefit with evaluation inpatient by a dermatologist which is not available here and should be transferred to an academic medical center.  That will allow possible better diagnosis, treatment and expertise if cyclosporin is indicated or some other option.   Will continue with steroids  2.  Possible Mycobacterial disease - I think this is less likely.  Tissue has been sent multiple times for culture and path and no evidence of infectious origin.  I though will have her continue Mycobacterial treatment for now.

## 2016-08-16 NOTE — Progress Notes (Signed)
Pharmacy Antibiotic Note  Rita FieldingSally J Watkins is a 61 y.o. female with a complicated history of progressive necrotizing L leg wound since Feb 2018 requiring 4th toe amputation, admitted on 08/14/2016 d/t acute worsening of wound with spread to abdomen. Concern is for non-TB mycobacterial infection vs pyoderma gangrenosum. Pharmacy has been consulted for amikacin/cefoxitin dosing. Has also been started on Solu-medrol to r/o PG.  Today, 08/16/2016:  WBC, SCr both elevated, stable  Plan:  Increase cefoxitin to 2 gr IV q4h with stable renal function (recommended dose 12 mg/d for mycobacterial infxn)  Amikacin 1725 mg IV q24h, using ABW of 82 kg and extended interval dose of 21 mg/kg  Amikacin level drawn this AM, 11 hr after 1st dose but usually takes several days to result  Clarithromycin 500 mg PO BID (per MD)  Monitor clinical course, renal function, cultures as available  Note Cefoxitin may falsely elevate SCr readings; ensure SCr drawn as close to cefoxitin trough as possible    Height: 5\' 9"  (175.3 cm) Weight: 234 lb 9.1 oz (106.4 kg) IBW/kg (Calculated) : 66.2  Temp (24hrs), Avg:98.3 F (36.8 C), Min:97.5 F (36.4 C), Max:99.9 F (37.7 C)   Recent Labs Lab 08/14/16 1539 08/14/16 1550 08/14/16 1611 08/15/16 0717 08/16/16 0432  WBC 20.0*  --   --  17.0* 19.7*  CREATININE 1.61*  --  1.50* 1.55* 1.44*  LATICACIDVEN  --  1.78  --   --   --     Estimated Creatinine Clearance: 52.6 mL/min (A) (by C-G formula based on SCr of 1.44 mg/dL (H)).    Allergies  Allergen Reactions  . Avelox [Moxifloxacin Hcl In Nacl] Anaphylaxis  . Terbinafine Hcl Swelling  . Zocor [Simvastatin - High Dose] Anaphylaxis  . Montelukast Sodium Other (See Comments)    Insomnia  . Pravastatin Other (See Comments)    Myalgias on 20mg     Antimicrobials this admission: 7/9 >> Vancomycin >> 7/10 7/9 >> Zosyn >> 7/10 7/10 >> amikacin >> 7/10 >> clarithromycin >> 7/10 >> cefoxitin >>   Dose  adjustments this admission: 7/11 0430: amikacin level (11 hr) - will take several days to result   Microbiology results: 7/9 BCx (x4): sent  Thank you for allowing pharmacy to be a part of this patient's care.   Bernadene Personrew Jasmina Gendron, PharmD, BCPS Pager: [phone] 08/16/2016, 11:58 AM

## 2016-08-16 NOTE — Progress Notes (Signed)
PROGRESS NOTE    Rita FieldingSally J Mcaulay  GNF:[id number] DOB: [birth year] DOA: 08/14/2016 PCP: Marisue BrooklynSaguier, Edward, PA-C    Brief Narrative:  61 y.o. 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   Patient's history starts from January 2018 when she dropped bottle of water on her foot, has sustained chronic infection on her left toe which as noted above has worsened to the point of necrosis that she required amputation. Somewhere around April 2018 she started to develop redness and what seemed to be an abscess of the left knee medially which worsened so quickly to the point that she required few hospitalizations and apparently multiple cultures done before were not adequate to assess what exactly is going on. With the thought was that patient possibly has pyoderma gangrenosum. She finally underwent debridement and the cultures were taken but again the organism was not found per patient. She has had wound care at home. She has followed with Dr. Ardelle AntonWagoner of podiatry as well as with infectious disease. With presentation to the hospital is that she fell. Her friend went to check on her and when they looked at the left leg they felt that infection has exponentially grown over past week or so. Patient herself does not report any fevers or chills. She does say that her wound has gotten drastically worse over short period of time with foul smell, purulent drainage. Patient also has new lesion on the abdomen for past 2 weeks. She hasn't been able to schedule an appointment to take a look at that lesion.  Assessment & Plan:   Principal Problem:   Pyoderma gangrenosa Active Problems:   Depression with anxiety   Leukocytosis   Benign essential HTN  Principal Problem:   Pyoderma gangrenosa / Toxic appearing infection of the left leg / leukocytosis -patient started to have  left 4th toe infection since 02/2016, she received multiple rounds of abx since 02/2016, she underwent left 4th  Toe amputation in 07/2016,  -she started to notice left leg wound in 05/2016, she is hospitalized the third time for rapid progressing left leg wound, multiple debridements/biopsy/pathology and culture all unrevealing, now she started to have left lower abdomen skin wound started a few weeks ago - rapid progressive left lower leg wound with large patch of necrotic appearing tissues across inner aspect of L leg, she does has leukocytosis, but does not have fever, does not look septic - ABI's ordered. But due to pain, evaluation of left lower extremity was not adequate, patient declined MRI leg dur to claustrophobic, she has ct leg in 06/2016, can consider CTA leg -RF elevated at 35.9, ana, anca, antiphospholipid antibodies pending, blood culture no growth -  general surgery consulted and has signed off, - She was started on vancomycin and Zosyn, abx changed to cefoxitin and amikacin to cover possible mycobacterial disease,- IV steroids started by ID to cover possible pyoderma gangrenosum, infectious disease recommend [facility] to [facility] for inpatient dermatology evaluation      Benign essential HTN - Blood pressure medication on hold due to soft blood pressure    Acute renal failure superimposed on chronic kidney disease stage III   - Presenting creatinine 1.61 (cr in 06/2016 was 0.7- 0.8) - Cr currently 1.44 - Repeat BMET in  AM, renal dosing meds    Anemia of chronic kidney disease   hgb 10-11 in 06/2016, 8-9  in 07/2016, hgb this hospitalization 7-8 - hemodynamically stable at this time - no acute blood loss, suspect anemia of chronic disease and chronic blood loss from leg, tbili 0.8 , hemolysis is less likely -hgb 6.8 this am on 7/11, transfuse prbc x1    Hypokalemia  -  Continue to replace as needed   Depression with anxiety - Continue Xanax, gabapentin - Appears to be  stable at this time  Body mass index is 34.64 kg/m.    DVT prophylaxis: SCD's Code Status: Full Family Communication: Pt in room, family not at bedside Disposition Plan: transfer to academic center  Consultants:   ID  General Surgery  Procedures:   none  Antimicrobials: Anti-infectives    Start     Dose/Rate Route Frequency Ordered Stop   08/16/16 0800  cefOXitin (MEFOXIN) 8 g in sodium chloride 0.9 % 210 mL continuous infusion  Status:  Discontinued     8 g 12.5 mL/hr over 20 Hours Intravenous Every 24 hours 08/15/16 1601 08/15/16 1636   08/15/16 1800  clarithromycin (BIAXIN) tablet 500 mg     500 mg Oral Every 12 hours 08/15/16 1601     08/15/16 1800  cefOXitin (MEFOXIN) 2 g in dextrose 5 % 50 mL IVPB     2 g 100 mL/hr over 30 Minutes Intravenous Every 6 hours 08/15/16 1650     08/15/16 1800  amikacin (AMIKIN) 1,725 mg in dextrose 5 % 100 mL IVPB     21 mg/kg  82.3 kg (Adjusted) 106.9 mL/hr over 60 Minutes Intravenous Every 24 hours 08/15/16 1703     08/15/16 1615  cefOXitin (MEFOXIN) 2 g in dextrose 5 % 50 mL IVPB  Status:  Discontinued     2 g 100 mL/hr over 30 Minutes Intravenous Every 4 hours 08/15/16 1601 08/15/16 1636   08/15/16 1600  vancomycin (VANCOCIN) 1,250 mg in sodium chloride 0.9 % 250 mL IVPB  Status:  Discontinued     1,250 mg 166.7 mL/hr over 90 Minutes Intravenous Every 24 hours 08/14/16 1624 08/15/16 1601   08/14/16 2200  piperacillin-tazobactam (ZOSYN) IVPB 3.375 g  Status:  Discontinued     3.375 g 12.5 mL/hr over 240 Minutes Intravenous Every 8 hours 08/14/16 1623 08/15/16 1601   08/14/16 1530  vancomycin (VANCOCIN) 2,250 mg in sodium chloride 0.9 % 500 mL IVPB     2,250 mg 250 mL/hr over 120 Minutes Intravenous STAT 08/14/16 1449 08/14/16 1859   08/14/16 1500  piperacillin-tazobactam (ZOSYN) IVPB 3.375 g     3.375 g 100 mL/hr over 30 Minutes Intravenous STAT 08/14/16 1447 08/14/16 1721      Subjective: No fever, intermittent leg pain  requiring prn iv pain meds  Objective: Vitals:   08/15/16 0453 08/15/16 1500 08/15/16 2117 08/16/16 0509  BP: (!) 95/43 (!) 114/50 (!) 101/53 (!) 114/47  Pulse: 68 84 68 (!) 52  Resp: 16 20 16 16   Temp: 98.8 F (37.1 C) 99.9 F (37.7 C) 98.1 F (36.7 C) (!) 97.5 F (36.4 C)  TempSrc: Axillary Oral Oral Oral  SpO2: 91% 96% 97% 95%  Weight: 106.4 kg (234 lb 9.1 oz)     Height:        Intake/Output Summary (Last 24 hours) at 08/16/16 0800 Last data filed at 08/16/16 0700  Gross per 24 hour  Intake          2307.73 ml  Output                0  ml  Net          2307.73 ml   Filed Weights   08/14/16 1345 08/15/16 0453  Weight: 108.9 kg (240 lb) 106.4 kg (234 lb 9.1 oz)    Examination:  General exam: Appears calm and comfortable  Respiratory system: Clear to auscultation. Respiratory effort normal. Cardiovascular system: S1 & S2 heard, RRR. Gastrointestinal system: Abdomen is nondistended, soft and nontender. No organomegaly or masses felt. Normal bowel sounds heard. Central nervous system: Alert and oriented. No focal neurological deficits. Extremities: Symmetric 5 x 5 power. Skin: large patch of necrosis over inner-LLE, sanguinous drainage staining sheets, few scatter shallow ulcer on left lower abdomen Psychiatry: Judgement and insight appear normal. Mood & affect appropriate.   Data Reviewed: I have personally reviewed following labs and imaging studies  CBC:  Recent Labs Lab 08/14/16 1539 08/14/16 1611 08/15/16 0717 08/16/16 0432  WBC 20.0*  --  17.0* 19.7*  NEUTROABS 15.2*  --   --   --   HGB 7.7* 8.5* 7.0* 6.8*  HCT 23.7* 25.0* 22.0* 21.1*  MCV 81.4  --  82.4 82.1  PLT 504*  --  411* 402*   Basic Metabolic Panel:  Recent Labs Lab 08/14/16 1539 08/14/16 1611 08/15/16 0717 08/16/16 0432  NA 135 135 137 137  K 3.4* 3.4* 3.4* 3.6  CL 98* 95* 99* 101  CO2 24  --  26 26  GLUCOSE 102* 105* 99 160*  BUN 32* 30* 30* 24*  CREATININE 1.61* 1.50* 1.55*  1.44*  CALCIUM 8.4*  --  8.2* 8.1*   GFR: Estimated Creatinine Clearance: 52.6 mL/min (A) (by C-G formula based on SCr of 1.44 mg/dL (H)). Liver Function Tests:  Recent Labs Lab 08/14/16 1539 08/15/16 0717  AST 20 16  ALT 12* 10*  ALKPHOS 128* 107  BILITOT 0.7 0.8  PROT 8.0 6.4*  ALBUMIN 2.6* 2.0*   No results for input(s): LIPASE, AMYLASE in the last 168 hours. No results for input(s): AMMONIA in the last 168 hours. Coagulation Profile: No results for input(s): INR, PROTIME in the last 168 hours. Cardiac Enzymes: No results for input(s): CKTOTAL, CKMB, CKMBINDEX, TROPONINI in the last 168 hours. BNP (last 3 results) No results for input(s): PROBNP in the last 8760 hours. HbA1C: No results for input(s): HGBA1C in the last 72 hours. CBG:  Recent Labs Lab 08/15/16 0747  GLUCAP 88   Lipid Profile: No results for input(s): CHOL, HDL, LDLCALC, TRIG, CHOLHDL, LDLDIRECT in the last 72 hours. Thyroid Function Tests: No results for input(s): TSH, T4TOTAL, FREET4, T3FREE, THYROIDAB in the last 72 hours. Anemia Panel: No results for input(s): VITAMINB12, FOLATE, FERRITIN, TIBC, IRON, RETICCTPCT in the last 72 hours. Sepsis Labs:  Recent Labs Lab 08/14/16 1550  LATICACIDVEN 1.78    Recent Results (from the past 240 hour(s))  Blood culture (routine x 2)     Status: None (Preliminary result)   Collection Time: 08/14/16  3:39 PM  Result Value Ref Range Status   Specimen Description BLOOD LEFT FOREARM  Final   Special Requests   Final    BOTTLES DRAWN AEROBIC AND ANAEROBIC Blood Culture adequate volume   Culture   Final    NO GROWTH < 24 HOURS Performed at [facility], [address], [place], [place] [address]    Report Status PENDING  Incomplete  Blood culture (routine x 2)     Status: None (Preliminary result)   Collection Time: 08/14/16  3:39 PM  Result Value  Ref Range Status   Specimen Description BLOOD RIGHT FOREARM  Final   Special Requests IN PEDIATRIC  BOTTLE Blood Culture adequate volume  Final   Culture   Final    NO GROWTH < 24 HOURS Performed at [facility], [address] [place], [place] [address]    Report Status PENDING  Incomplete     Radiology Studies: No results found.  Scheduled Meds: . clarithromycin  500 mg Oral Q12H  . methylPREDNISolone (SOLU-MEDROL) injection  125 mg Intravenous Q6H  . sodium chloride flush  3 mL Intravenous Q12H  . vitamin C  500 mg Oral Daily   Continuous Infusions: . sodium chloride 50 mL/hr at 08/14/16 2100  . sodium chloride    . amikacin (AMIKIN) Extended Interval dosing IVPB Stopped (08/15/16 1838)  . cefOXitin Stopped (08/16/16 6045)     LOS: 2 days    Time spent>  Cobie Marcoux, MD PhD [facility] Pager [phone]  If 7PM-7AM, please contact night-coverage [email or web address] Password [email or web address] 08/16/2016, 8:00 AM

## 2016-08-16 NOTE — Progress Notes (Signed)
CSW received a call from pt's RN requesting assistance [facility]  CSW is normally not involved in hospital-to-hospital and [facility]  AC is Psychologist, clinicalassisting RN with transfer shortly.  Please reconsult if future social work needs arise.  CSW signing off, as social work intervention is no longer needed.  Dorothe PeaJonathan F. Avy Barlett, Francesco SorLCSWA, LCAS, CSI Clinical Social Worker Ph: [phone]

## 2016-08-16 NOTE — Progress Notes (Signed)
VASCULAR LAB PRELIMINARY  ARTERIAL  ABI completed:    RIGHT    LEFT    PRESSURE WAVEFORM  PRESSURE WAVEFORM  BRACHIAL 139 Triphasic BRACHIAL 122 Triphasic  DP 106 Triphasic DP Not audible   AT   AT Not audible   PT 121 Triphasic PT Unable to tolerate cuff Severely Dampened monophasic  PER   PER      RIGHT LEFT  ABI 0.87 Unable to obtain  \Left - Unable to obtain ABIs. Patient though totally cooperative was unable to tolerate the pressure of the cuff on her leg due to the condition of the leg even with pain medication . She could barely tolerate the Doppler probe when Doppler waveforms were taken. There was no audible signals in the left pedal artries except for the posterior tibial artery which was severely dampened monophasic. Right ABI indicate a mild reduction in arterial flow with normal Doppler waveforms at rest.   Valera Vallas, RVS 08/16/2016, 12:40 PM

## 2016-08-16 NOTE — Progress Notes (Signed)
PT Cancellation Note  Patient Details Name: Rita FieldingSally J Watkins MRN: [id number] DOB: [birth year]   Cancelled Treatment:    Reason Eval/Treat Not Completed: Pain limiting ability to participate. Will mobilize as patient able to tolerate. States the [facility] that [facility]ates [facility].    Sharen HeckHill, Evie Crumpler Elizabeth Krystianna Soth PT [phone]  08/16/2016, 4:34 PM

## 2016-08-16 NOTE — Progress Notes (Signed)
[facility] called for transport. 

## 2016-08-16 NOTE — Progress Notes (Signed)
[facility] to [facility],RN on 9N

## 2016-08-16 NOTE — Discharge Summary (Addendum)
Discharge Summary  Rita Watkins:[id number] DOB: 07/07/1955  PCP: Esperanza Richters, PA-C  Admit date: 08/14/2016 Discharge date: 08/16/2016  Time spent: >37mins  Patient is transferred to [facility] for progressive left lower leg wound, need dermatology eval   Discharge Diagnoses:  Active Hospital Problems   Diagnosis Date Noted  . 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 Benign essential HTN 08/14/2016  . Leukocytosis 07/30/2014  . Depression with anxiety 09/13/2012    Resolved Hospital Problems   Diagnosis Date Noted Date Resolved  No resolved problems to display.    Discharge Condition: stable  Diet recommendation: heart healthy  Filed Weights   08/14/16 1345 08/15/16 0453  Weight: 108.9 kg (240 lb) 106.4 kg (234 lb 9.1 oz)    History of present illness:   PCP: Esperanza Richters, PA-C   Outpatient Specialists: Jillyn Ledger ([facility]); Dr. Daiva Eves (ID)  Patient coming from: home  Chief Complaint: left leg wound  HPI: Rita Watkins is a 61 y.o. 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   Patient's history starts from January 2018 when she dropped bottle of water on her foot, has sustained chronic infection on her left toe which as noted above has worsened to the point of necrosis that she required amputation. Somewhere around April 2018 she started to develop redness and what seemed to be an abscess of the left knee medially which worsened so quickly to the point that she required few hospitalizations and apparently multiple cultures done before were not adequate to assess what exactly is going on. With the thought was that patient possibly has pyoderma gangrenosum. She finally underwent debridement and the cultures were taken but again the organism was not found per patient. She has  had wound care at home. She has followed with Dr. Ardelle Anton of podiatry as well as with infectious disease. With presentation to the hospital is that she fell. Her friend went to check on her and when they looked at the left leg they felt that infection has exponentially grown over past week or so. Patient herself does not report any fevers or chills. She does say that her wound has gotten drastically worse over short period of time with foul smell, purulent drainage. Patient also has new lesion on the abdomen for past 2 weeks. She hasn't been able to schedule an appointment to take a look at that lesion.  ED Course: Vital signs are stable in ED. Blood pressure is 98/52 but it has improved to 113/66. Blood work is notable for leukocytosis of 20, hemoglobin 7.7 and subsequently 8.5. Potassium was 3.4 and he was supplemented beneath the. Creatinine was 1.61 and lactic acid 1.78. Patient was started on empiric antibiotics, vancomycin and Zosyn. Surgery will see her in consultation.  Hospital Course:  Principal Problem:   Pyoderma gangrenosa Active Problems:   Depression with anxiety   Leukocytosis   Benign essential HTN   Principal Problem: Pyoderma gangrenosa / Toxic appearing infection of the left leg / leukocytosis -patient started to have left 4th toe infection since 02/2016, she received multiple rounds of abx since 02/2016, she underwent left 4th  Toe amputation in 07/2016,  -she started to notice left leg wound in 05/2016, she is hospitalized the third time for rapid progressing left leg wound, multiple debridements/biopsy/pathology and culture all unrevealing, now she started to  have left lower abdomen skin wound started a few weeks ago - rapid progressive left lower leg wound with large patch of necrotic appearing tissues across inner aspect of L leg, she does has leukocytosis, but does not have fever, does not look septic - ABI's ordered. But due to pain, evaluation of left lower extremity  was not adequate, patient declined MRI leg dur to claustrophobic, venous ultrasound done in 05/2016 negative for DVT. she has ct leg in 06/2016, can consider CTA leg when cr improves,  -RF elevated at 35.9, ana, anca, antiphospholipid antibodies pending, blood culture no growth -  general surgery consulted and has signed off, - She was started on vancomycin and Zosyn, abx changed to cefoxitin and amikacin to cover possible mycobacterial disease,- IV steroids started by ID to cover possible pyoderma gangrenosum, infectious disease [facility] for [facility] dermatology evaluation - I have discussed with [facility] hospitalist physician Dr Carollee Massed. Who graciously accepted the hospital transfer.   Benign essential HTN - home Blood pressure medication losartan/hctz on hold due to soft blood pressure and cr elevation  Acute renal failure superimposed on chronic kidney disease stage III  - Presenting creatinine 1.61 (cr in 06/2016 was 0.7- 0.8) - Cr currently 1.44 - Repeat BMET in  AM, renal dosing meds, continue hold home bp meds  Anemia of chronic kidney disease  -hgb 10-11 in 06/2016, 8-9 in 07/2016, hgb this hospitalization 7-8 - hemodynamically stable at this time - no acute blood loss, suspect anemia of chronic disease and chronic blood loss from leg, tbili 0.8 , hemolysis is less likely -hgb 6.8 this am on 7/11, transfuse prbc x1  Hypokalemia  -  Continue to replace as needed   Depression with anxiety - Continue Xanax, gabapentin - Appears to be stable at this time  Body mass index is 34.64 kg/m.    DVT prophylaxis: SCD's Code Status: Full Family Communication: Pt in room, family not at bedside Disposition Plan: transfer to academic center  Consultants:   ID  General Surgery  Procedures:   none  Antimicrobials:            Anti-infectives     Start        Dose/Rate  Route  Frequency  Ordered   Stop    08/16/16 0800   cefOXitin (MEFOXIN) 8 g in sodium chloride 0.9 % 210 mL continuous infusion  Status:  Discontinued      8 g  12.5 mL/hr over 20 Hours  Intravenous  Every 24 hours  08/15/16 1601  08/15/16 1636    08/15/16 1800   clarithromycin (BIAXIN) tablet 500 mg      500 mg  Oral  Every 12 hours  08/15/16 1601       08/15/16 1800   cefOXitin (MEFOXIN) 2 g in dextrose 5 % 50 mL IVPB      2 g  100 mL/hr over 30 Minutes  Intravenous  Every 6 hours  08/15/16 1650       08/15/16 1800   amikacin (AMIKIN) 1,725 mg in dextrose 5 % 100 mL IVPB      21 mg/kg  82.3 kg (Adjusted)  106.9 mL/hr over 60 Minutes  Intravenous  Every 24 hours  08/15/16 1703       08/15/16 1615   cefOXitin (MEFOXIN) 2 g in dextrose 5 % 50 mL IVPB  Status:  Discontinued      2 g  100 mL/hr over 30 Minutes  Intravenous  Every 4 hours  08/15/16 1601  08/15/16 1636    08/15/16 1600   vancomycin (VANCOCIN) 1,250 mg in sodium chloride 0.9 % 250 mL IVPB  Status:  Discontinued      1,250 mg  166.7 mL/hr over 90 Minutes  Intravenous  Every 24 hours  08/14/16 1624  08/15/16 1601    08/14/16 2200   piperacillin-tazobactam (ZOSYN) IVPB 3.375 g  Status:  Discontinued      3.375 g  12.5 mL/hr over 240 Minutes  Intravenous  Every 8 hours  08/14/16 1623  08/15/16 1601    08/14/16 1530   vancomycin (VANCOCIN) 2,250 mg in sodium chloride 0.9 % 500 mL IVPB      2,250 mg  250 mL/hr over 120 Minutes  Intravenous  STAT  08/14/16 1449  08/14/16 1859    08/14/16 1500   piperacillin-tazobactam (ZOSYN) IVPB 3.375 g      3.375 g  100 mL/hr over 30 Minutes  Intravenous  STAT  08/14/16 1447  08/14/16 1721        Discharge Exam: BP 111/64 (BP Location: Left Arm)   Pulse 61   Temp 98 F (36.7 C) (Oral)   Resp 18   Ht 5\' 9"  (1.753 m)   Wt 106.4 kg (234 lb 9.1 oz)   SpO2 100%   BMI 34.64 kg/m   General: NAD Cardiovascular:  RRR Respiratory: CTABL Skin:  large patch of necrosis over inner-LLE, sanguinous drainage staining sheets, few scatter shallow ulcers on left lower abdomen  Discharge Instructions You were cared for by a hospitalist during your hospital stay. If you have any questions about your discharge medications or the care you received while you were in the hospital after you are discharged, you can call the unit and asked to speak with the hospitalist on call if the hospitalist that took care of you is not available. Once you are discharged, your primary care physician will handle any further medical issues. Please note that NO REFILLS for any discharge medications will be authorized once you are discharged, as it is imperative that you return to your primary care physician (or establish a relationship with a primary care physician if you do not have one) for your aftercare needs so that they can reassess your need for medications and monitor your lab values.  Discharge Instructions    Diet - low sodium heart healthy    Complete by:  As directed    Increase activity slowly    Complete by:  As directed      Allergies as of 08/16/2016      Reactions   Avelox [moxifloxacin Hcl In Nacl] Anaphylaxis   Terbinafine Hcl Swelling   Zocor [simvastatin - High Dose] Anaphylaxis   Montelukast Sodium Other (See Comments)   Insomnia   Pravastatin Other (See Comments)   Myalgias on 20mg       Medication List    STOP taking these medications   clarithromycin 500 MG tablet Commonly known as:  BIAXIN   ibuprofen 200 MG tablet Commonly known as:  ADVIL,MOTRIN   linezolid 600 MG tablet Commonly known as:  ZYVOX   losartan-hydrochlorothiazide 100-12.5 MG tablet Commonly known as:  HYZAAR   oxyCODONE 5 MG immediate release tablet Commonly known as:  Oxy IR/ROXICODONE     TAKE these medications   acetaminophen 325 MG tablet Commonly known as:  TYLENOL Take 650 mg by mouth every 6 (six) hours as needed for  moderate pain.   ALPRAZolam 0.5  MG tablet Commonly known as:  XANAX Take 1 tablet (0.5 mg total) by mouth 2 (two) times daily as needed for anxiety.   gabapentin 300 MG capsule Commonly known as:  NEURONTIN Take 1 capsule (300 mg total) by mouth at bedtime.   methylPREDNISolone sodium succinate 125 mg/2 mL injection Commonly known as:  SOLU-MEDROL Inject 2 mLs (125 mg total) into the vein every 6 (six) hours. Start taking on:  08/17/2016   ondansetron 4 MG tablet Commonly known as:  ZOFRAN Take 1 tablet (4 mg total) by mouth every 8 (eight) hours as needed for nausea or vomiting.   oxyCODONE-acetaminophen 5-325 MG tablet Commonly known as:  PERCOCET/ROXICET Take 1-2 tablets by mouth every 6 (six) hours as needed for severe pain.   vitamin C 500 MG tablet Commonly known as:  ASCORBIC ACID Take 500 mg by mouth daily.      Allergies  Allergen Reactions  . Avelox [Moxifloxacin Hcl In Nacl] Anaphylaxis  . Terbinafine Hcl Swelling  . Zocor [Simvastatin - High Dose] Anaphylaxis  . Montelukast Sodium Other (See Comments)    Insomnia  . Pravastatin Other (See Comments)    Myalgias on 20mg       The results of significant diagnostics from this hospitalization (including imaging, microbiology, ancillary and laboratory) are listed below for reference.    Significant Diagnostic Studies: No results found.  Microbiology: Recent Results (from the past 240 hour(s))  Blood culture (routine x 2)     Status: None (Preliminary result)   Collection Time: 08/14/16  3:39 PM  Result Value Ref Range Status   Specimen Description BLOOD LEFT FOREARM  Final   Special Requests   Final    BOTTLES DRAWN AEROBIC AND ANAEROBIC Blood Culture adequate volume   Culture   Final    NO GROWTH 2 DAYS Performed at [facility], [address] [place], [place] [address]    Report Status PENDING  Incomplete  Blood culture (routine x 2)     Status: None (Preliminary result)   Collection Time:  08/14/16  3:39 PM  Result Value Ref Range Status   Specimen Description BLOOD RIGHT FOREARM  Final   Special Requests IN PEDIATRIC BOTTLE Blood Culture adequate volume  Final   Culture   Final    NO GROWTH 2 DAYS Performed at [facility], [address] [place], [place] [address]    Report Status PENDING  Incomplete  Culture, blood (routine x 2)     Status: None (Preliminary result)   Collection Time: 08/14/16  9:37 PM  Result Value Ref Range Status   Specimen Description BLOOD RIGHT ANTECUBITAL  Final   Special Requests IN PEDIATRIC BOTTLE Blood Culture adequate volume  Final   Culture   Final    NO GROWTH 1 DAY Performed at [facility], [address] [place], [place] [address]    Report Status PENDING  Incomplete  Culture, blood (routine x 2)     Status: None (Preliminary result)   Collection Time: 08/14/16  9:37 PM  Result Value Ref Range Status   Specimen Description BLOOD LEFT ANTECUBITAL  Final   Special Requests IN PEDIATRIC BOTTLE Blood Culture adequate volume  Final   Culture   Final    NO GROWTH 1 DAY Performed at [facility], [address] [place], [place] [address]    Report Status PENDING  Incomplete     Labs: Basic Metabolic Panel:  Recent Labs Lab 08/14/16  1539 08/14/16 1611 08/15/16 0717 08/16/16 0432  NA 135 135 137 137  K 3.4* 3.4* 3.4* 3.6  CL 98* 95* 99* 101  CO2 24  --  26 26  GLUCOSE 102* 105* 99 160*  BUN 32* 30* 30* 24*  CREATININE 1.61* 1.50* 1.55* 1.44*  CALCIUM 8.4*  --  8.2* 8.1*   Liver Function Tests:  Recent Labs Lab 08/14/16 1539 08/15/16 0717  AST 20 16  ALT 12* 10*  ALKPHOS 128* 107  BILITOT 0.7 0.8  PROT 8.0 6.4*  ALBUMIN 2.6* 2.0*   No results for input(s): LIPASE, AMYLASE in the last 168 hours. No results for input(s): AMMONIA in the last 168 hours. CBC:  Recent Labs Lab 08/14/16 1539 08/14/16 1611 08/15/16 0717 08/16/16 0432  WBC 20.0*  --  17.0* 19.7*  NEUTROABS 15.2*  --   --    --   HGB 7.7* 8.5* 7.0* 6.8*  HCT 23.7* 25.0* 22.0* 21.1*  MCV 81.4  --  82.4 82.1  PLT 504*  --  411* 402*   Cardiac Enzymes: No results for input(s): CKTOTAL, CKMB, CKMBINDEX, TROPONINI in the last 168 hours. BNP: BNP (last 3 results) No results for input(s): BNP in the last 8760 hours.  ProBNP (last 3 results) No results for input(s): PROBNP in the last 8760 hours.  CBG:  Recent Labs Lab 08/15/16 0747 08/16/16 0736  GLUCAP 88 172*       SignedAlbertine Grates MD, PhD  [facility] 08/16/2016, 6:48 PM

## 2016-08-17 ENCOUNTER — Encounter: Payer: Self-pay | Admitting: Podiatry

## 2016-08-17 DIAGNOSIS — S81802S Unspecified open wound, left lower leg, sequela: Secondary | ICD-10-CM

## 2016-08-17 HISTORY — DX: Unspecified open wound, left lower leg, sequela: S81.802S

## 2016-08-17 LAB — ANTIPHOSPHOLIPID SYNDROME EVAL, BLD
Anticardiolipin IgA: <9 APL U/mL (ref 0–11)
Anticardiolipin IgG: <9 GPL U/mL (ref 0–14)
Anticardiolipin IgM: 16 MPL U/mL — ABNORMAL HIGH (ref 0–12)
DRVVT: 45.4 s (ref 0.0–47.0)
PTT Lupus Anticoagulant: 38.7 s (ref 0.0–51.9)
Phosphatydalserine, IgA: 1 APS IgA (ref 0–20)
Phosphatydalserine, IgG: 3 GPS IgG (ref 0–11)
Phosphatydalserine, IgM: 22 MPS IgM (ref 0–25)

## 2016-08-17 LAB — BPAM RBC
BLOOD PRODUCT EXPIRATION DATE: 201807282359
ISSUE DATE / TIME: 201807111004
Unit Type and Rh: 5100

## 2016-08-17 LAB — TYPE AND SCREEN
ABO/RH(D): O POS
Antibody Screen: NEGATIVE
Unit division: 0

## 2016-08-17 LAB — ANA W/REFLEX IF POSITIVE: Anti Nuclear Antibody(ANA): NEGATIVE

## 2016-08-17 NOTE — Progress Notes (Signed)
Patient transferred in stable [facility].

## 2016-08-18 DIAGNOSIS — I82433 Acute embolism and thrombosis of popliteal vein, bilateral: Secondary | ICD-10-CM

## 2016-08-18 DIAGNOSIS — I2699 Other pulmonary embolism without acute cor pulmonale: Secondary | ICD-10-CM

## 2016-08-18 DIAGNOSIS — Z720 Tobacco use: Secondary | ICD-10-CM

## 2016-08-18 HISTORY — DX: Other pulmonary embolism without acute cor pulmonale: I26.99

## 2016-08-18 HISTORY — DX: Tobacco use: Z72.0

## 2016-08-18 HISTORY — DX: Acute embolism and thrombosis of popliteal vein, bilateral: I82.433

## 2016-08-18 LAB — AMIKACIN LEVEL: Amikacin Rm: 33.1 ug/mL — ABNORMAL HIGH (ref 1.0–30.0)

## 2016-08-19 DIAGNOSIS — I739 Peripheral vascular disease, unspecified: Secondary | ICD-10-CM | POA: Insufficient documentation

## 2016-08-19 HISTORY — DX: Peripheral vascular disease, unspecified: I73.9

## 2016-08-19 LAB — CULTURE, BLOOD (ROUTINE X 2)
CULTURE: NO GROWTH
Culture: NO GROWTH
SPECIAL REQUESTS: ADEQUATE
Special Requests: ADEQUATE

## 2016-08-20 LAB — CULTURE, BLOOD (ROUTINE X 2)
CULTURE: NO GROWTH
Culture: NO GROWTH
Special Requests: ADEQUATE
Special Requests: ADEQUATE

## 2016-08-22 DIAGNOSIS — M319 Necrotizing vasculopathy, unspecified: Secondary | ICD-10-CM

## 2016-08-22 DIAGNOSIS — D62 Acute posthemorrhagic anemia: Secondary | ICD-10-CM

## 2016-08-22 HISTORY — DX: Acute posthemorrhagic anemia: D62

## 2016-08-22 HISTORY — DX: Necrotizing vasculopathy, unspecified: M31.9

## 2016-08-23 ENCOUNTER — Encounter: Payer: Self-pay | Admitting: Podiatry

## 2016-08-23 NOTE — Progress Notes (Signed)
Called patients friend to check on Kennon RoundsSally. Got an update on her and offered my support. Patients friend was appreciative of my call and appreciative of my input through this process.

## 2016-09-20 DIAGNOSIS — E669 Obesity, unspecified: Secondary | ICD-10-CM

## 2016-09-20 HISTORY — DX: Obesity, unspecified: E66.9

## 2016-10-27 ENCOUNTER — Other Ambulatory Visit: Payer: Self-pay

## 2016-10-27 NOTE — Progress Notes (Signed)
error 

## 2017-03-16 IMAGING — CR DG CHEST 2V
2 series · 2 of 2 positions shown · non-contrast
Comparison: PA and lateral chest x-ray April 25, 2014

CLINICAL DATA: Wheezing and shortness of breath, history of asthma
and COPD, currently smoking

EXAM:
CHEST  2 VIEW

[lateral]
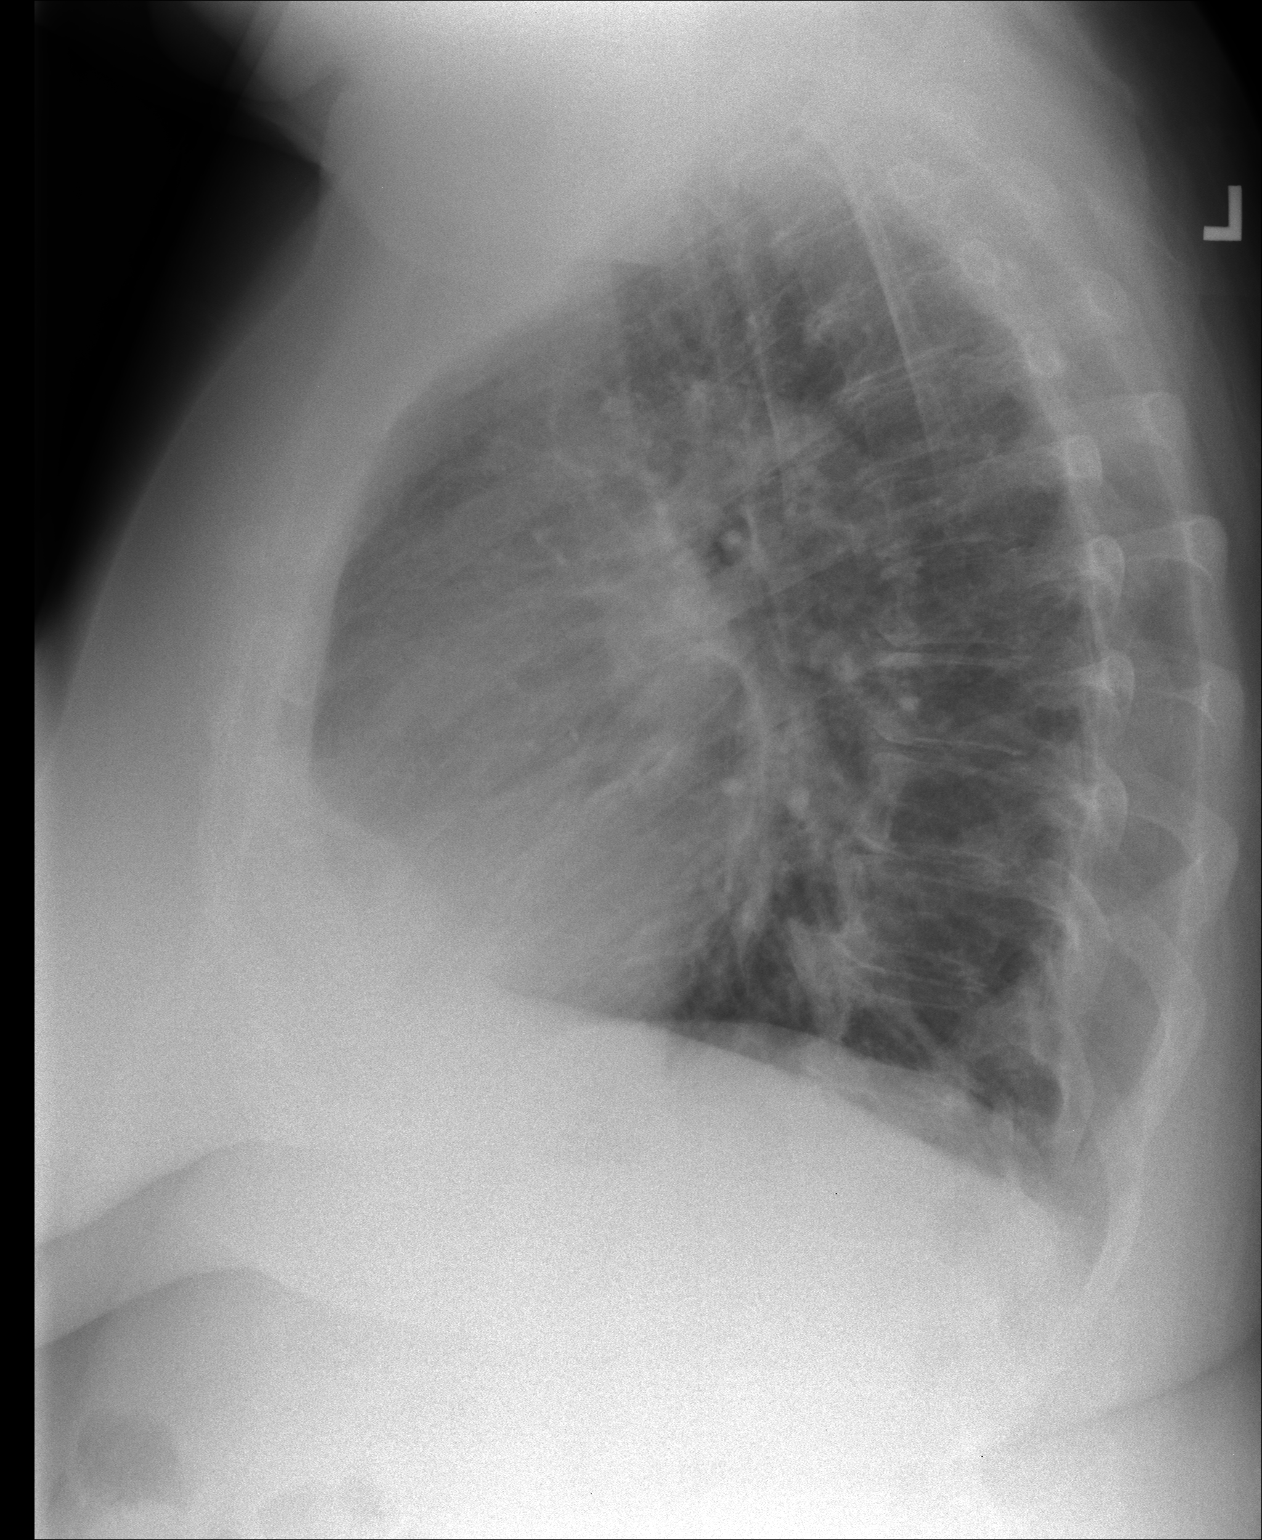

[PA]
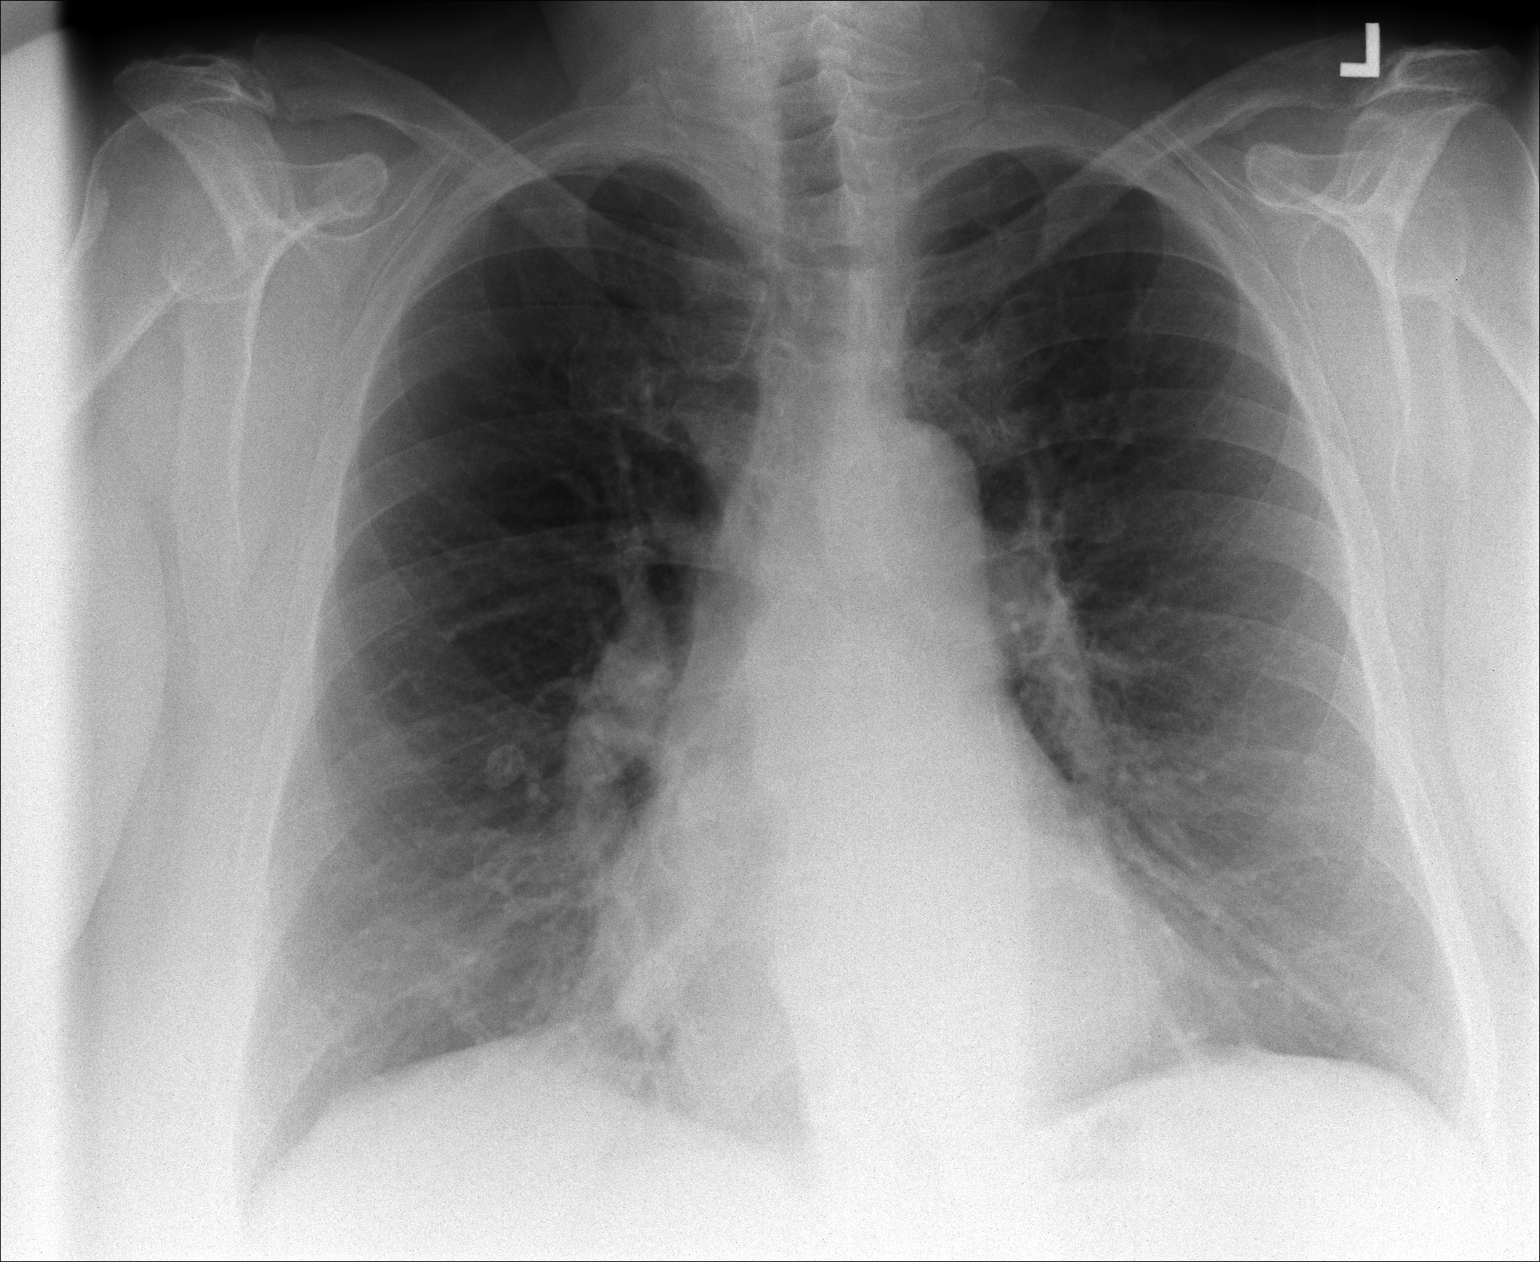

[2 of 2 positions shown; findings below may reference images not displayed]

FINDINGS: The lungs are mildly hyperinflated with hemidiaphragm flattening.
There is no focal infiltrate. The interstitial markings are mildly
prominent but stable. The heart and pulmonary vascularity are
normal. The mediastinum is normal in width. The trachea is midline.
The bony thorax exhibits no acute abnormality.
IMPRESSION: COPD. There is no pneumonia, CHF, nor other acute cardiopulmonary
abnormality.

## 2017-06-13 ENCOUNTER — Encounter: Payer: Self-pay | Admitting: Medical

## 2017-06-13 ENCOUNTER — Ambulatory Visit (INDEPENDENT_AMBULATORY_CARE_PROVIDER_SITE_OTHER): Payer: Self-pay | Admitting: Medical

## 2017-06-13 VITALS — BP 130/60 | HR 97 | Temp 97.0°F | Resp 16 | Ht 69.0 in | Wt 200.0 lb

## 2017-06-13 DIAGNOSIS — Z86718 Personal history of other venous thrombosis and embolism: Secondary | ICD-10-CM

## 2017-06-13 DIAGNOSIS — I10 Essential (primary) hypertension: Secondary | ICD-10-CM

## 2017-06-13 DIAGNOSIS — G546 Phantom limb syndrome with pain: Secondary | ICD-10-CM

## 2017-06-13 DIAGNOSIS — Z89619 Acquired absence of unspecified leg above knee: Secondary | ICD-10-CM

## 2017-06-13 LAB — COMPREHENSIVE METABOLIC PANEL
ALBUMIN: 3.9 g/dL (ref 3.5–5.2)
ALT: 10 U/L (ref 0–35)
AST: 15 U/L (ref 0–37)
Alkaline Phosphatase: 102 U/L (ref 39–117)
BUN: 17 mg/dL (ref 6–23)
CALCIUM: 9.5 mg/dL (ref 8.4–10.5)
CHLORIDE: 105 meq/L (ref 96–112)
CO2: 28 mEq/L (ref 19–32)
Creatinine, Ser: 0.73 mg/dL (ref 0.40–1.20)
GFR: 85.4 mL/min (ref >60.00)
Glucose, Bld: 100 mg/dL — ABNORMAL HIGH (ref 70–99)
POTASSIUM: 4.8 meq/L (ref 3.5–5.1)
SODIUM: 140 meq/L (ref 135–145)
Total Bilirubin: 0.4 mg/dL (ref 0.2–1.2)
Total Protein: 7.8 g/dL (ref 6.0–8.3)

## 2017-06-13 LAB — PROTIME-INR
INR: 4.5 ratio — AB (ref 0.8–1.0)
PROTHROMBIN TIME: 51.2 s — AB (ref 9.6–13.1)

## 2017-06-13 MED ORDER — DULOXETINE HCL 30 MG PO CPEP: ORAL_CAPSULE | ORAL | 0 refills | Status: DC

## 2017-06-13 MED ORDER — BENAZEPRIL HCL 20 MG PO TABS: ORAL_TABLET | ORAL | 0 refills | Status: DC

## 2017-06-13 MED ORDER — ALPRAZOLAM 0.5 MG PO TABS: 0.5000 mg | ORAL_TABLET | Freq: Two times a day (BID) | ORAL | 0 refills | Status: DC | PRN

## 2017-06-13 MED ORDER — GABAPENTIN 300 MG PO CAPS: 300.0000 mg | ORAL_CAPSULE | Freq: Every day | ORAL | 0 refills | Status: DC

## 2017-06-13 MED FILL — GABAPENTIN 300 MG CAPSULE: 300 | 30 days supply | Qty: 30 | Fill #0

## 2017-06-13 NOTE — Patient Instructions (Signed)
Your blood pressure is good today.  But your readings at the nursing home were showing very low diastolic levels.  I want you to check your blood pressure twice daily at home and document those readings.  Then give me update in a week I might make adjustments if needed.  For your history of amputation and graft, please watch those areas.  If you get any breakdown I would try to quickly get an appointment with a wound care.  For neuropathic pain, I did write a prescription of gabapentin to use at night.  Hold OxyContin presently as you expressed desire not to use that.  Try mirrow therapy as well if needed as you report that helped you in the past.  Good job on stopping smoking.  For history of DVT but INR value of 5 today INR fast POCT test, I want you to not take your Coumadin dose tonight or tomorrow until we verify INR value with our lab.  After INR back from our lab will make adjustments going forward.   He will likely need INR recheck in 1 week.  But also follow-up in 1 month for routine checkup regarding the above conditions.

## 2017-06-13 NOTE — Progress Notes (Signed)
Subjective:    Patient ID: Rita Watkins, female    DOB: 03/30/1955, 62 y.o.   MRN: [id number]  HPI  Pt in for follow up.  Pt had 6 month battle with chronic leg wound and eventual sepsis. She had to have amputation in order to save her life per pt. Both wound specialist at [facility] and [place] had extreme difficult time treating her. 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  Pt has a prosthetic limb that might need some adjustment. Pt has wound care team that explained how to treat to left thigh stub and how to treat rt lateral thigh graft area.   Pt has lost weight since last seen.  She is using manual wheel chair daily. She is doing a 2 mile route aily. Trying to build core muscles with exercise PT gave her.  Pt is breathing better now. Pt states has not used inhaler in months. Stopped smoking August 14, 2016 of last year.  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 She is currently on coumadin.   Pt states with new bp med her diastoic numbers sometimes are as low as 35. Other times 40-50. Systolics were 110-125. Pt never is light headed. No syncope. These were reading at the nursing home  Pt last inr was 5 weeks ago.   Review of Systems  Constitutional: Negative for chills, fatigue and fever.  Respiratory: Negative for cough, chest tightness, shortness of breath and wheezing.   Cardiovascular: Negative for chest pain and palpitations.  Gastrointestinal: Negative for abdominal pain.  Musculoskeletal: Negative for back pain.  Skin: Negative for rash.       Pt following her graft and stump  Neurological: Negative for dizziness, weakness, numbness and headaches.       Pt has some phantom pain. She is on oxycontin. She does not want to be on oxy. She wants to try gabapentin.  Hematological: Negative for adenopathy. Does not bruise/bleed easily.  Psychiatric/Behavioral: Negative for behavioral problems, confusion, sleep disturbance and  suicidal ideas. The patient is not nervous/anxious.     Past Medical History:  Diagnosis Date  . Allergy   . Anxiety   . Asthma   . COPD (chronic obstructive pulmonary disease) (HCC)   . Depression   . Dizziness 07/19/2016  . Hyperlipidemia   . Hypertension   . Lymphangitis 06/07/2016  . 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 Nausea 07/19/2016  . Necrosis (HCC) 07/19/2016     Social History   Socioeconomic History  . Marital status: Widowed    Spouse name: Not on file  . Number of children: Not on file  . Years of education: high schoo  . Highest education level: Not on file  Occupational History  . Occupation: ADM. ASSISTANT    Employer: [employer]  Social Needs  . Financial resource strain: Not on file  . Food insecurity:    Worry: Not on file    Inability: Not on file  . Transportation needs:    Medical: Not on file    Non-medical: Not on file  Tobacco Use  . Smoking status: Former Smoker    Packs/day: 1.00    Years: 35.00    Pack years: 35.00    Types: Cigarettes    Last attempt to quit: 09/14/2016    Years since quitting: 0.7  . Smokeless tobacco: Never Used  Substance and Sexual Activity  . Alcohol use: No    Alcohol/week: 0.0 oz  .  Drug use: No  . Sexual activity: Never  Lifestyle  . Physical activity:    Days per week: Not on file    Minutes per session: Not on file  . Stress: Not on file  Relationships  . Social connections:    Talks on phone: Not on file    Gets together: Not on file    Attends religious service: Not on file    Active member of club or organization: Not on file    Attends meetings of clubs or organizations: Not on file    Relationship status: Not on file  . Intimate partner violence:    Fear of current or ex partner: Not on file    Emotionally abused: Not on file    Physically abused: Not on file    Forced sexual activity: Not on file  Other Topics Concern  . Not on file  Social History Narrative   Does not exercise.     Past Surgical History:  Procedure Laterality Date  . FOOT SURGERY     Left  . HIP PINNING     3 each hip  . IRRIGATION AND DEBRIDEMENT ABSCESS N/A 06/27/2016   Procedure: IRRIGATION AND DEBRIDEMENT LEFT LEG WITH SOTFT TISSUE BIOPSY;  Surgeon: Berna Bue, MD;  Location: [facility];  Service: General;  Laterality: N/A;  . LYMPH NODE BIOPSY Left 06/09/2016   Procedure: BIOPSY LEFT LEG;  Surgeon: Peggye Form, DO;  Location: [facility];  Service: Plastics;  Laterality: Left;  . SLIPPED CAPITAL FEMORAL EPIPHYSIS PINNING      Family History  Problem Relation Age of Onset  . Hypertension Mother   . Hyperlipidemia Mother   . Hypertension Brother   . Asthma Brother     Allergies  Allergen Reactions  . Avelox [Moxifloxacin Hcl In Nacl] Anaphylaxis  . Terbinafine Hcl Swelling  . Zocor [Simvastatin - High Dose] Anaphylaxis  . Montelukast Sodium Other (See Comments)    Insomnia  . Pravastatin Other (See Comments)    Myalgias on     Current Outpatient Medications on File Prior to Visit  Medication Sig Dispense Refill  . acetaminophen (TYLENOL) 325 MG tablet Take 650 mg by mouth every 6 (six) hours as needed for moderate pain.     Marland Kitchen ALPRAZolam (XANAX) 0.5 MG tablet Take 1 tablet (0.5 mg total) by mouth 2 (two) times daily as needed for anxiety. 20 tablet 0  . benazepril (LOTENSIN) 20 MG tablet TK 1 T PO BID  0  . DULoxetine (CYMBALTA) 30 MG capsule TK 1 C PO D  0  . gabapentin (NEURONTIN) 300 MG capsule Take 1 capsule (300 mg total) by mouth at bedtime. 90 capsule 3  . methylPREDNISolone sodium succinate (SOLU-MEDROL) 125 mg/2 mL injection Inject 2 mLs (125 mg total) into the vein every 6 (six) hours. 1 each 0  . ondansetron (ZOFRAN) 4 MG tablet Take 1 tablet (4 mg total) by mouth every 8 (eight) hours as needed for nausea or vomiting. 90 tablet 0  . oxyCODONE-acetaminophen (PERCOCET/ROXICET) 5-325 MG tablet Take 1-2 tablets by mouth every 6 (six) hours as needed for severe  pain. 30 tablet 0  . vitamin C (ASCORBIC ACID) 500 MG tablet Take 500 mg by mouth daily.    Marland Kitchen warfarin (COUMADIN) 4 MG tablet Take 8 mg by mouth daily.     No current facility-administered medications on file prior to visit.     BP (!) 151/50   Pulse 97   Temp (!)  97 F (36.1 C) (Oral)   Resp 16   Wt 200 lb (90.7 kg)   SpO2 100%   BMI 29.53 kg/m       Objective:   Physical Exam  General Mental Status- Alert. General Appearance- Not in acute distress.   Skin General: Color- Normal Color. Moisture- Normal Moisture.  Neck Carotid Arteries- Normal color. Moisture- Normal Moisture. No carotid bruits. No JVD.  Chest and Lung Exam Auscultation: Breath Sounds:-Normal.  Cardiovascular Auscultation:Rythm- Regular. Murmurs & Other Heart Sounds:Auscultation of the heart reveals- No Murmurs.  Abdomen Inspection:-Inspeection Normal. Palpation/Percussion:Note:No mass. Palpation and Percussion of the abdomen reveal- Non Tender, Non Distended + BS, no rebound or guarding.  Neurologic Cranial Nerve exam:- CN III-XII intact(No nystagmus), symmetric smile. Strength:- 5/5 equal and symmetric strength both upper and lower extremities.  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 Rt leg- no pedal edem. No swelling. Negative homans sign.      Assessment & Plan:  Your blood pressure is good today.  But your readings at the nursing home were showing very low diastolic levels.  I want you to check your blood pressure twice daily at home and document those readings.  Then give me update in a week I might make adjustments if needed.  For your history of amputation and graft, please watch those areas.  If you get any breakdown I would try to quickly get an appointment with a wound care.  For neuropathic pain, I did write a prescription of gabapentin to use at night.  Hold OxyContin presently as you expressed desire not to use that.  Try mirrow therapy as well if needed as you report  that helped you in the past.  Good job on stopping smoking.  For history of DVT but INR value of 5 today INR fast POCT test, I want you to not take your Coumadin dose tonight or tomorrow until we verify INR value with our lab.  After INR back from our lab will make adjustments going forward.   He will likely need INR recheck in 1 week.  But also follow-up in 1 month for routine checkup regarding the above conditions.  For anxiety also refilled her xanax. On follow up will likely place her back on controlled med contract in one month. Refilled her cymbalta as well.  Esperanza Richters, PA-C

## 2017-06-14 MED FILL — DULoxetine HCL 30 MG CPEP: 30 | 30 days supply | Qty: 30 | Fill #0

## 2017-06-14 MED FILL — ALPRAZolam 0.5 MG TABS: 0.5 | 30 days supply | Qty: 60 | Fill #0

## 2017-06-14 MED FILL — BENAZEPRIL HCL 20 MG TABLET: 20 | 30 days supply | Qty: 60 | Fill #0

## 2017-06-20 ENCOUNTER — Ambulatory Visit (INDEPENDENT_AMBULATORY_CARE_PROVIDER_SITE_OTHER): Payer: Self-pay

## 2017-06-20 DIAGNOSIS — Z7901 Long term (current) use of anticoagulants: Secondary | ICD-10-CM

## 2017-06-20 LAB — POCT INR: INR: 1.3

## 2017-06-20 NOTE — Progress Notes (Signed)
Pre visit review using our clinic review tool, if applicable. No additional management support is needed unless otherwise documented below in the visit note.  Pt here today for INR check.   At last check on 06/13/2017- her INR was confirmed at 4.5- she was instructed to hold coumadin 5/8 through 5/10, then starting on 5/11 take coumadin  daily and recheck INR today. Pt denies negative findings.   INR today: 1.3 Goal is 2.0-3.0.   Per Ramon Dredge: Tomorrow and Friday take  of Coumadin and then go back to  daily. Schedule follow-up with Ramon Dredge in 1 week.   Pt verbalized understanding.

## 2017-06-20 NOTE — Patient Instructions (Signed)
Tomorrow and Friday take Coumadin  (1.5 tablets of ) then go back to  daily. Follow-up with Ramon Dredge in 1 week to discuss BP's and recheck INR.

## 2017-06-27 ENCOUNTER — Encounter: Payer: Self-pay | Admitting: Medical

## 2017-06-27 ENCOUNTER — Ambulatory Visit (INDEPENDENT_AMBULATORY_CARE_PROVIDER_SITE_OTHER): Payer: Self-pay | Admitting: Medical

## 2017-06-27 ENCOUNTER — Telehealth: Payer: Self-pay | Admitting: Medical

## 2017-06-27 MED ORDER — AMLODIPINE BESYLATE 5 MG PO TABS: 5.0000 mg | ORAL_TABLET | Freq: Every day | ORAL | 0 refills | Status: DC

## 2017-06-27 MED FILL — AMLODIPINE BESYLATE 5 MG TA: 5 | 30 days supply | Qty: 30 | Fill #0

## 2017-06-27 NOTE — Progress Notes (Signed)
   Subjective:    Patient ID: Rita Watkins, female    DOB: 30-Aug-1955, 62 y.o.   MRN: [id number]  HPI Pt was inr check today. But they roomed her and I had brief conversation.. Advised. Inr 1.7 today. I advised to use coumadin 8 mg today and tomorrow and then resume 4 mg daily. Also her bp have been 140-150 range diastolic over past week. So added amlodipine.  Pt instructed to follow up one week for repeat inr and blood pressure check. Nurse visit. I won't charge her for this visit. I won't charge office visit with me. But if they change to nurse visit would ok that charge.   Review of Systems     Objective:   Physical Exam        Assessment & Plan:

## 2017-06-27 NOTE — Telephone Encounter (Signed)
rx amlodipine 5 mg q day.

## 2017-07-04 ENCOUNTER — Ambulatory Visit (INDEPENDENT_AMBULATORY_CARE_PROVIDER_SITE_OTHER): Payer: Self-pay | Admitting: Medical

## 2017-07-04 ENCOUNTER — Encounter: Payer: Self-pay | Admitting: Medical

## 2017-07-04 ENCOUNTER — Other Ambulatory Visit: Payer: Self-pay

## 2017-07-04 VITALS — BP 118/78 | HR 69

## 2017-07-04 DIAGNOSIS — Z7901 Long term (current) use of anticoagulants: Secondary | ICD-10-CM

## 2017-07-04 LAB — POCT INR: INR: 1.7 — AB (ref 2.0–3.0)

## 2017-07-04 MED ORDER — WARFARIN SODIUM 4 MG PO TABS: 8.0000 mg | ORAL_TABLET | Freq: Every day | ORAL | 1 refills | Status: DC

## 2017-07-04 MED FILL — WARFARIN SODIUM 5 MG TABLET: 5 | 15 days supply | Qty: 30 | Fill #0

## 2017-07-04 NOTE — Patient Instructions (Signed)
Per E. Saguier patient to take Coumadin 10 mg today and Thursday, 8 mg on Friday and resume Coumadin 4 mg daily.

## 2017-07-04 NOTE — Progress Notes (Addendum)
Pre visit review using our clinic tool,if applicable. No additional management support is needed unless otherwise documented below in the visit note.   Patient in for BP check and INR check per order from E. Saguier,PA-C dated 06/27/17.  Last INR = 1.7   Last BP = 156/55  INR today = 1.7 again  Per E. Saguier patient to take Coumadin 10 mg today and Thursday, 8 mg on Friday and resume Coumadin 4 mg daily. Return in 1 week for INR check.  BP ok today continue taking BP medications as ordered.  Patient agreed,appointment scheduled.   This is the advise I gave after discussing with Clydie Braun LPN.  Esperanza Richters, PA-C

## 2017-07-11 ENCOUNTER — Telehealth: Payer: Self-pay | Admitting: *Deleted

## 2017-07-11 ENCOUNTER — Ambulatory Visit (INDEPENDENT_AMBULATORY_CARE_PROVIDER_SITE_OTHER): Payer: Self-pay | Admitting: *Deleted

## 2017-07-11 DIAGNOSIS — Z79899 Other long term (current) drug therapy: Secondary | ICD-10-CM

## 2017-07-11 DIAGNOSIS — Z86718 Personal history of other venous thrombosis and embolism: Secondary | ICD-10-CM

## 2017-07-11 DIAGNOSIS — F411 Generalized anxiety disorder: Secondary | ICD-10-CM

## 2017-07-11 LAB — POCT INR: INR: 2.5 (ref 2.0–3.0)

## 2017-07-11 MED ORDER — GABAPENTIN 300 MG PO CAPS: 300.0000 mg | ORAL_CAPSULE | Freq: Every day | ORAL | 5 refills | Status: DC

## 2017-07-11 MED ORDER — DULOXETINE HCL 30 MG PO CPEP: ORAL_CAPSULE | ORAL | 1 refills | Status: DC

## 2017-07-11 MED ORDER — BENAZEPRIL HCL 20 MG PO TABS: ORAL_TABLET | ORAL | 5 refills | Status: DC

## 2017-07-11 MED FILL — GABAPENTIN 300 MG CAPSULE: 300 | 30 days supply | Qty: 30 | Fill #0

## 2017-07-11 MED FILL — DULoxetine HCL 30 MG CPEP: 30 | 30 days supply | Qty: 30 | Fill #0

## 2017-07-11 MED FILL — BENAZEPRIL HCL 20 MG TABLET: 20 | 30 days supply | Qty: 60 | Fill #0

## 2017-07-11 NOTE — Patient Instructions (Signed)
Continue taking Coumadin 5mg  once a day and return for INR check on 08/10/17 at 2pm.

## 2017-07-11 NOTE — Progress Notes (Addendum)
Pt here for INR check per Esperanza RichtersEdward Saguier, PA-C.  Goal INR = 2.0-3.0  Last INR = 1.7 on 07/04/17  Pt was advised to take Coumadin 10mg  Wed & Thurs, 8mg  on Friday then resume 4mg  daily and return in 1 week for INR check. Pharmacy did not have medication available in that strength and pt was given 5mg . She has been taking 5mg  once a day since Saturday.  Pt denies recent antibiotics, no dietary changes and no unusual bruising / bleeding.  INR today = 2.5  Pt advised per Ramon DredgeEdward, to continue Coumadin 5mg  once a day and return in 1 month for repeat INR.  Nurse visit scheduled for 08/10/17 at 2pm.  Since in range advised to make no changes. Pt was advised by pharmacist last week on how to take. She did not follow my regimen advised as the 4 mg tab not available. Talked with Romeo AppleBen Pharmacist and ok'd adjusted regimen based on the 5mg  tab which they had in stock.  Esperanza RichtersEdward Saguier, PA-C

## 2017-07-11 NOTE — Telephone Encounter (Signed)
Pt in office today for INR check and requests refills on:  Alprazolam Gabapentin Benzepril Duloxetine  Refills sent on all except Alprazolam. Please see below and advise?  Last alprazolam RX: 06/13/17, #60 x no refills. Last OV: 07/04/17 Next OV: none scheduled UDS: 11/29/15, low risk; 1 yr CSC: 11/29/15 CSR: See NARX report on [facility] for PCP.

## 2017-07-12 MED ORDER — DULOXETINE HCL 30 MG PO CPEP: ORAL_CAPSULE | ORAL | 5 refills | Status: DC

## 2017-07-12 MED ORDER — GABAPENTIN 300 MG PO CAPS: 300.0000 mg | ORAL_CAPSULE | Freq: Every day | ORAL | 5 refills | Status: DC

## 2017-07-12 MED ORDER — BENAZEPRIL HCL 20 MG PO TABS: ORAL_TABLET | ORAL | 5 refills | Status: DC

## 2017-07-12 NOTE — Telephone Encounter (Signed)
I refilled her meds except xanax. She need to give uds. Those meds on her narx report given by hospital providers when she was fighting severe infection in her leg that was eventually amputated. Please notify her of requirment to give uds. She is cash pay so please make sure done with [facility].

## 2017-07-13 NOTE — Addendum Note (Signed)
Addended by: Mervin KungFERGERSON, Jeane Cashatt A on: 07/13/2017 11:44 AM   Modules accepted: Orders

## 2017-07-13 NOTE — Telephone Encounter (Signed)
Notified pt of below and she voices understanding. States weather is supposed to be rainy from now through next Tuesday. Pt is unable to go out in the rain due to being an amputee and has to use a walker. She is unable to use walker and hold umbrella at the same time. Pt scheduled lab appt for 07/18/17 at 11:10am. Future lab order has been entered. Advised pt that Contract would be left at the front desk for her to sign and she voices understanding.

## 2017-07-18 ENCOUNTER — Other Ambulatory Visit: Payer: Self-pay

## 2017-07-23 MED FILL — WARFARIN SODIUM 5 MG TABLET: 5 | 15 days supply | Qty: 30 | Fill #1

## 2017-08-02 ENCOUNTER — Other Ambulatory Visit: Payer: Self-pay | Admitting: Medical

## 2017-08-02 MED FILL — AMLODIPINE BESYLATE 5 MG TA: 5 | 30 days supply | Qty: 30 | Fill #0

## 2017-08-10 ENCOUNTER — Ambulatory Visit: Payer: Self-pay

## 2017-08-13 MED FILL — BENAZEPRIL HCL 20 MG TABLET: 20 | 30 days supply | Qty: 60 | Fill #1

## 2017-08-13 MED FILL — DULoxetine HCL 30 MG CPEP: 30 | 30 days supply | Qty: 30 | Fill #1

## 2017-08-13 MED FILL — GABAPENTIN 300 MG CAPSULE: 300 | 30 days supply | Qty: 30 | Fill #1

## 2017-08-13 MED FILL — WARFARIN SODIUM 5 MG TABLET: 5 | 15 days supply | Qty: 30 | Fill #2

## 2017-08-14 ENCOUNTER — Ambulatory Visit (INDEPENDENT_AMBULATORY_CARE_PROVIDER_SITE_OTHER): Payer: Self-pay

## 2017-08-14 DIAGNOSIS — Z79899 Other long term (current) drug therapy: Secondary | ICD-10-CM

## 2017-08-14 DIAGNOSIS — Z7901 Long term (current) use of anticoagulants: Secondary | ICD-10-CM

## 2017-08-14 LAB — POCT INR: INR: 2.2 (ref 2.0–3.0)

## 2017-08-14 NOTE — Progress Notes (Signed)
Pre visit review using our clinic tool,if applicable. No additional management support is needed unless otherwise documented below in the visit note.  Pt here for INR check per order from Whole FoodsEdward Saguier, PA-C  Goal INR = 2.0-3.0  Last INR = 2.5  Pt currently takes Coumadin 5 mg daily  Pt denies recent antibiotics, no dietary changes and no unusual bruising / bleeding.  INR today = 2.2  Pt advised per Esperanza RichtersEdward Saguier, PA-C to continue taking Coumadin 5 mg daily and return for INR re-check in 1 month. Appointment scheduled.

## 2017-09-10 MED FILL — BENAZEPRIL HCL 20 MG TABLET: 20 | 30 days supply | Qty: 60 | Fill #2

## 2017-09-10 MED FILL — GABAPENTIN 300 MG CAPSULE: 300 | 30 days supply | Qty: 30 | Fill #2

## 2017-09-10 MED FILL — WARFARIN SODIUM 5 MG TABLET: 5 | 15 days supply | Qty: 30 | Fill #3

## 2017-09-14 ENCOUNTER — Ambulatory Visit: Payer: Self-pay

## 2017-09-17 ENCOUNTER — Ambulatory Visit (INDEPENDENT_AMBULATORY_CARE_PROVIDER_SITE_OTHER): Payer: Self-pay | Admitting: Medical

## 2017-09-17 ENCOUNTER — Encounter: Payer: Self-pay | Admitting: Medical

## 2017-09-17 VITALS — BP 140/65 | HR 65 | Temp 98.6°F | Resp 16 | Ht 69.0 in

## 2017-09-17 DIAGNOSIS — Z86718 Personal history of other venous thrombosis and embolism: Secondary | ICD-10-CM

## 2017-09-17 DIAGNOSIS — F411 Generalized anxiety disorder: Secondary | ICD-10-CM

## 2017-09-17 DIAGNOSIS — Z79899 Other long term (current) drug therapy: Secondary | ICD-10-CM

## 2017-09-17 DIAGNOSIS — I10 Essential (primary) hypertension: Secondary | ICD-10-CM

## 2017-09-17 DIAGNOSIS — R739 Hyperglycemia, unspecified: Secondary | ICD-10-CM

## 2017-09-17 LAB — COMPREHENSIVE METABOLIC PANEL
ALT: 10 U/L (ref 0–35)
AST: 14 U/L (ref 0–37)
Albumin: 4 g/dL (ref 3.5–5.2)
Alkaline Phosphatase: 117 U/L (ref 39–117)
BILIRUBIN TOTAL: 0.5 mg/dL (ref 0.2–1.2)
BUN: 14 mg/dL (ref 6–23)
CALCIUM: 9.8 mg/dL (ref 8.4–10.5)
CHLORIDE: 104 meq/L (ref 96–112)
CO2: 31 meq/L (ref 19–32)
CREATININE: 0.81 mg/dL (ref 0.40–1.20)
GFR: 75.68 mL/min (ref >60.00)
Glucose, Bld: 91 mg/dL (ref 70–99)
Potassium: 5.5 mEq/L — ABNORMAL HIGH (ref 3.5–5.1)
SODIUM: 140 meq/L (ref 135–145)
Total Protein: 7.7 g/dL (ref 6.0–8.3)

## 2017-09-17 LAB — PROTIME-INR
INR: 2.4 ratio — AB (ref 0.8–1.0)
PROTHROMBIN TIME: 27.3 s — AB (ref 9.6–13.1)

## 2017-09-17 LAB — HEMOGLOBIN A1C: Hgb A1c MFr Bld: 5.3 % (ref 4.6–6.5)

## 2017-09-17 MED ORDER — ALPRAZOLAM 0.5 MG PO TABS: 0.5000 mg | ORAL_TABLET | Freq: Two times a day (BID) | ORAL | 2 refills | Status: DC | PRN

## 2017-09-17 MED ORDER — AMLODIPINE BESYLATE 5 MG PO TABS: 5.0000 mg | ORAL_TABLET | Freq: Every day | ORAL | 0 refills | Status: DC

## 2017-09-17 MED ORDER — AMLODIPINE BESYLATE 5 MG PO TABS: 5.0000 mg | ORAL_TABLET | Freq: Every day | ORAL | 3 refills | Status: DC

## 2017-09-17 MED ORDER — DULOXETINE HCL 30 MG PO CPEP: ORAL_CAPSULE | ORAL | 5 refills | Status: DC

## 2017-09-17 MED FILL — ALPRAZolam 0.5 MG TABS: 0.5 | 30 days supply | Qty: 60 | Fill #0

## 2017-09-17 MED FILL — DULoxetine HCL 30 MG CPEP: 30 | 30 days supply | Qty: 30 | Fill #0

## 2017-09-17 MED FILL — AMLODIPINE BESYLATE 5 MG TA: 5 | 30 days supply | Qty: 30 | Fill #0

## 2017-09-17 NOTE — Patient Instructions (Signed)
For history of htn, I did refill your amlodipine since bp was high. Continue other bp meds.  For hx of dvt, I placed inr order. You have about one week of coumadin left. Will refill med accordingly when get inr result.  For history of anxiety I refilled your anxiety medication. You signed controlled med contract. Please give uds in lab.  For high blood sugar history will get 3 month blood sugar average test today. Low sugar diet recommended.  Follow up date to be determined after lab review.

## 2017-09-17 NOTE — Progress Notes (Signed)
Subjective:    Patient ID: Rita Watkins, female    DOB: 1955/06/27, 62 y.o.   MRN: [id number]  HPI  Pt in for follow up. Her bp is little high today. No cardiac or neurologic signs or symptoms.She is not on amlodipine for 2 weeks.  Pt has history of anxiety. She had anxiety for 5 years since husband passed. Mom passed away this May and had amputation of left lower ext past year. She signed contract today and gave uds today.  Also history of DVT and has been on coumadin. Pt last inr 2.1. Will get INR today. She has about 6 tabs left.   Review of Systems  Constitutional: Negative for chills, fatigue and fever.  Respiratory: Negative for cough, chest tightness, shortness of breath and wheezing.   Cardiovascular: Negative for chest pain and palpitations.  Gastrointestinal: Negative for abdominal pain.  Skin: Negative for rash.  Neurological: Negative for dizziness, seizures, weakness, light-headedness and headaches.  Psychiatric/Behavioral: Negative for agitation, behavioral problems, confusion, dysphoric mood and sleep disturbance. The patient is nervous/anxious.    Past Medical History:  Diagnosis Date  . Allergy   . Anxiety   . Asthma   . COPD (chronic obstructive pulmonary disease) (HCC)   . Depression   . Dizziness 07/19/2016  . Hyperlipidemia   . Hypertension   . Lymphangitis 06/07/2016  . 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 Nausea 07/19/2016  . Necrosis (HCC) 07/19/2016     Social History   Socioeconomic History  . Marital status: Widowed    Spouse name: Not on file  . Number of children: Not on file  . Years of education: high schoo  . Highest education level: Not on file  Occupational History  . Occupation: ADM. ASSISTANT    Employer: [employer]  Social Needs  . Financial resource strain: Not on file  . Food insecurity:    Worry: Not on file    Inability: Not on file  . Transportation needs:    Medical: Not on file    Non-medical: Not on file    Tobacco Use  . Smoking status: Former Smoker    Packs/day: 1.00    Years: 35.00    Pack years: 35.00    Types: Cigarettes    Last attempt to quit: 09/14/2016    Years since quitting: 1.0  . Smokeless tobacco: Never Used  Substance and Sexual Activity  . Alcohol use: No    Alcohol/week: 0.0 standard drinks  . Drug use: No  . Sexual activity: Never  Lifestyle  . Physical activity:    Days per week: Not on file    Minutes per session: Not on file  . Stress: Not on file  Relationships  . Social connections:    Talks on phone: Not on file    Gets together: Not on file    Attends religious service: Not on file    Active member of club or organization: Not on file    Attends meetings of clubs or organizations: Not on file    Relationship status: Not on file  . Intimate partner violence:    Fear of current or ex partner: Not on file    Emotionally abused: Not on file    Physically abused: Not on file    Forced sexual activity: Not on file  Other Topics Concern  . Not on file  Social History Narrative   Does not exercise.    Past Surgical History:  Procedure Laterality Date  .  FOOT SURGERY     Left  . HIP PINNING     3 each hip  . IRRIGATION AND DEBRIDEMENT ABSCESS N/A 06/27/2016   Procedure: IRRIGATION AND DEBRIDEMENT LEFT LEG WITH SOTFT TISSUE BIOPSY;  Surgeon: Berna Bueonnor, Chelsea A, MD;  Location: [facility];  Service: General;  Laterality: N/A;  . LYMPH NODE BIOPSY Left 06/09/2016   Procedure: BIOPSY LEFT LEG;  Surgeon: Peggye Formillingham, Claire S, DO;  Location: [facility];  Service: Plastics;  Laterality: Left;  . SLIPPED CAPITAL FEMORAL EPIPHYSIS PINNING      Family History  Problem Relation Age of Onset  . Hypertension Mother   . Hyperlipidemia Mother   . Hypertension Brother   . Asthma Brother     Allergies  Allergen Reactions  . Avelox [Moxifloxacin Hcl In Nacl] Anaphylaxis  . Terbinafine Hcl Swelling  . Zocor [Simvastatin - High Dose] Anaphylaxis  . Montelukast Sodium  Other (See Comments)    Insomnia  . Pravastatin Other (See Comments)    Myalgias on 20mg     Current Outpatient Medications on File Prior to Visit  Medication Sig Dispense Refill  . acetaminophen (TYLENOL) 325 MG tablet Take 650 mg by mouth every 6 (six) hours as needed for moderate pain.     . benazepril (LOTENSIN) 20 MG tablet 1 tab po bid 60 tablet 5  . gabapentin (NEURONTIN) 300 MG capsule Take 1 capsule (300 mg total) by mouth at bedtime. 30 capsule 5  . methylPREDNISolone sodium succinate (SOLU-MEDROL) 125 mg/2 mL injection Inject 2 mLs (125 mg total) into the vein every 6 (six) hours. 1 each 0  . ondansetron (ZOFRAN) 4 MG tablet Take 1 tablet (4 mg total) by mouth every 8 (eight) hours as needed for nausea or vomiting. 90 tablet 0  . vitamin C (ASCORBIC ACID) 500 MG tablet Take 500 mg by mouth daily.    Marland Kitchen. warfarin (COUMADIN) 5 MG tablet Take 5 mg by mouth daily.  1   No current facility-administered medications on file prior to visit.     BP 140/65   Pulse 65   Temp 98.6 F (37 C) (Oral)   Resp 16   Ht 5\' 9"  (1.753 m)   SpO2 98%   BMI 29.71 kg/m       Objective:   Physical Exam  General Mental Status- Alert. General Appearance- Not in acute distress.   Skin General: Color- Normal Color. Moisture- Normal Moisture.  Neck Carotid Arteries- Normal color. Moisture- Normal Moisture. No carotid bruits. No JVD.  Chest and Lung Exam Auscultation: Breath Sounds:-Normal.  Cardiovascular Auscultation:Rythm- Regular. Murmurs & Other Heart Sounds:Auscultation of the heart reveals- No Murmurs. .  Neurologic Cranial Nerve exam:- CN III-XII intact(No nystagmus), symmetric smile. Strength:- 5/5 equal and symmetric strength both upper and lower extremities.      Assessment & Plan:  For history of htn, I did refill your amlodipine since bp was high. Continue other bp meds.  For hx of dvt, I placed inr order. You have about one week of coumadin left. Will refill med  accordingly when get inr result.  For history of anxiety I refilled your anxiety medication. You signed controlled med contract. Please give uds in lab.  For high blood sugar history will get 3 month blood sugar average test today. Low sugar diet recommended.  Follow up date to be determined after lab review.  Esperanza RichtersEdward Angelina Neece, PA-C

## 2017-09-18 ENCOUNTER — Telehealth: Payer: Self-pay | Admitting: Medical

## 2017-09-18 DIAGNOSIS — E875 Hyperkalemia: Secondary | ICD-10-CM

## 2017-09-18 MED ORDER — WARFARIN SODIUM 5 MG PO TABS: 5.0000 mg | ORAL_TABLET | Freq: Every day | ORAL | 1 refills | Status: DC

## 2017-09-18 NOTE — Telephone Encounter (Signed)
Future order cmp placed. Recheck k level.

## 2017-09-18 NOTE — Telephone Encounter (Signed)
Refilled coumadin today. INR was 2.4.

## 2017-09-19 MED FILL — WARFARIN SODIUM 5 MG TABLET: 5 | 30 days supply | Qty: 30 | Fill #0

## 2017-09-20 LAB — PAIN MGMT, PROFILE 8 W/CONF, U
6 ACETYLMORPHINE: NEGATIVE ng/mL (ref <10)
AMPHETAMINES: NEGATIVE ng/mL (ref <500)
Alcohol Metabolites: NEGATIVE ng/mL (ref <500)
Alphahydroxyalprazolam: 154 ng/mL — ABNORMAL HIGH (ref <25)
Alphahydroxymidazolam: NEGATIVE ng/mL (ref <50)
Alphahydroxytriazolam: NEGATIVE ng/mL (ref <50)
Aminoclonazepam: NEGATIVE ng/mL (ref <25)
BENZODIAZEPINES: POSITIVE ng/mL — AB (ref <100)
Buprenorphine, Urine: NEGATIVE ng/mL (ref <5)
Cocaine Metabolite: NEGATIVE ng/mL (ref <150)
Creatinine: 53.8 mg/dL
HYDROXYETHYLFLURAZEPAM: NEGATIVE ng/mL (ref <50)
Lorazepam: NEGATIVE ng/mL (ref <50)
MARIJUANA METABOLITE: NEGATIVE ng/mL (ref <20)
MDMA: NEGATIVE ng/mL (ref <500)
NORDIAZEPAM: NEGATIVE ng/mL (ref <50)
OPIATES: NEGATIVE ng/mL (ref <100)
OXIDANT: NEGATIVE ug/mL (ref <200)
OXYCODONE: NEGATIVE ng/mL (ref <100)
Oxazepam: NEGATIVE ng/mL (ref <50)
Temazepam: NEGATIVE ng/mL (ref <50)
pH: 5.53 (ref 4.5–9.0)

## 2017-10-11 MED FILL — BENAZEPRIL HCL 20 MG TABLET: 20 | 30 days supply | Qty: 60 | Fill #3

## 2017-10-11 MED FILL — GABAPENTIN 300 MG CAPSULE: 300 | 30 days supply | Qty: 30 | Fill #3

## 2017-10-19 ENCOUNTER — Other Ambulatory Visit: Payer: Self-pay | Admitting: Medical

## 2017-10-19 MED FILL — WARFARIN SODIUM 5 MG TABLET: 5 | 30 days supply | Qty: 30 | Fill #1

## 2017-10-19 MED FILL — ALPRAZolam 0.5 MG TABS: 0.5 | 30 days supply | Qty: 60 | Fill #1

## 2017-10-19 MED FILL — DULoxetine HCL 30 MG CPEP: 30 | 30 days supply | Qty: 30 | Fill #1

## 2017-10-22 MED FILL — AMLODIPINE BESYLATE 5 MG TA: 5 | 30 days supply | Qty: 30 | Fill #0

## 2017-11-12 MED FILL — GABAPENTIN 300 MG CAPSULE: 300 | 30 days supply | Qty: 30 | Fill #4

## 2017-11-12 MED FILL — BENAZEPRIL HCL 20 MG TABLET: 20 | 30 days supply | Qty: 60 | Fill #4

## 2017-11-15 ENCOUNTER — Ambulatory Visit (INDEPENDENT_AMBULATORY_CARE_PROVIDER_SITE_OTHER): Payer: Self-pay | Admitting: Medical

## 2017-11-15 VITALS — BP 123/68 | HR 74 | Temp 98.1°F | Resp 16

## 2017-11-15 DIAGNOSIS — F411 Generalized anxiety disorder: Secondary | ICD-10-CM

## 2017-11-15 DIAGNOSIS — Z86718 Personal history of other venous thrombosis and embolism: Secondary | ICD-10-CM

## 2017-11-15 DIAGNOSIS — Z23 Encounter for immunization: Secondary | ICD-10-CM

## 2017-11-15 DIAGNOSIS — G546 Phantom limb syndrome with pain: Secondary | ICD-10-CM

## 2017-11-15 DIAGNOSIS — I10 Essential (primary) hypertension: Secondary | ICD-10-CM

## 2017-11-15 MED ORDER — AMLODIPINE BESYLATE 5 MG PO TABS
5.0000 mg | ORAL_TABLET | Freq: Every day | ORAL | 0 refills | Status: DC
Start: 2017-11-15 — End: 2017-12-17

## 2017-11-15 MED ORDER — WARFARIN SODIUM 5 MG PO TABS: 5.0000 mg | ORAL_TABLET | Freq: Every day | ORAL | 1 refills | Status: DC

## 2017-11-15 MED FILL — WARFARIN SODIUM 5 MG TABLET: 5 | 30 days supply | Qty: 30 | Fill #0

## 2017-11-15 MED FILL — AMLODIPINE BESYLATE 5 MG TA: 5 | 30 days supply | Qty: 30 | Fill #0

## 2017-11-15 NOTE — Progress Notes (Signed)
Subjective:    Patient ID: Rita Watkins, female    DOB: 10-25-1955, 62 y.o.   MRN: [id number]  HPI  Pt in for follow up.  Pt has hx of dvt and her INR today is in 2 level range. So in the 2-3 range but borderline. Pt does admit eating a lot of greens recently.   She did get flu vaccine today.   Pt bp is good today. No cardio or neurologic signs or symptoms. Refilled amlodipine today.  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  Pt has anxiety and taking xanax twice a day. It is helping/controlling her anxiety. She is on contract and had uds.. Pt notes cymalta helping as well.  Pt states PT is going well. She feels like he might be able to transition to cane.   Review of Systems  Constitutional: Negative for chills, fatigue and fever.  HENT: Negative for congestion, drooling, facial swelling, hearing loss, sinus pressure and sinus pain.   Respiratory: Negative for cough, chest tightness, shortness of breath and wheezing.   Cardiovascular: Negative for chest pain and palpitations.  Gastrointestinal: Negative for abdominal pain.  Musculoskeletal: Negative for back pain.  Skin: Negative for rash.  Neurological: Negative for dizziness, speech difficulty, weakness, numbness and headaches.       See hpi.  Hematological: Negative for adenopathy. Does not bruise/bleed easily.  Psychiatric/Behavioral: Negative for behavioral problems, confusion and suicidal ideas. The patient is not nervous/anxious.        Mood is well controlled.    Past Medical History:  Diagnosis Date  . Allergy   . Anxiety   . Asthma   . COPD (chronic obstructive pulmonary disease) (HCC)   . Depression   . Dizziness 07/19/2016  . Hyperlipidemia   . Hypertension   . Lymphangitis 06/07/2016  . 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 Nausea 07/19/2016  . Necrosis (HCC) 07/19/2016     Social History   Socioeconomic History  . Marital status: Widowed   Spouse name: Not on file  . Number of children: Not on file  . Years of education: high schoo  . Highest education level: Not on file  Occupational History  . Occupation: ADM. ASSISTANT    Employer: [employer]  Social Needs  . Financial resource strain: Not on file  . Food insecurity:    Worry: Not on file    Inability: Not on file  . Transportation needs:    Medical: Not on file    Non-medical: Not on file  Tobacco Use  . Smoking status: Former Smoker    Packs/day: 1.00    Years: 35.00    Pack years: 35.00    Types: Cigarettes    Last attempt to quit: 09/14/2016    Years since quitting: 1.1  . Smokeless tobacco: Never Used  Substance and Sexual Activity  . Alcohol use: No    Alcohol/week: 0.0 standard drinks  . Drug use: No  . Sexual activity: Never  Lifestyle  . Physical activity:    Days per week: Not on file    Minutes per session: Not on file  . Stress: Not on file  Relationships  . Social connections:    Talks on phone: Not on file    Gets together: Not on file    Attends religious service: Not on file    Active member of club or organization: Not on file    Attends meetings of clubs  or organizations: Not on file    Relationship status: Not on file  . Intimate partner violence:    Fear of current or ex partner: Not on file    Emotionally abused: Not on file    Physically abused: Not on file    Forced sexual activity: Not on file  Other Topics Concern  . Not on file  Social History Narrative   Does not exercise.    Past Surgical History:  Procedure Laterality Date  . FOOT SURGERY     Left  . HIP PINNING     3 each hip  . IRRIGATION AND DEBRIDEMENT ABSCESS N/A 06/27/2016   Procedure: IRRIGATION AND DEBRIDEMENT LEFT LEG WITH SOTFT TISSUE BIOPSY;  Surgeon: Berna Bue, MD;  Location: [facility];  Service: General;  Laterality: N/A;  . LYMPH NODE BIOPSY Left 06/09/2016   Procedure: BIOPSY LEFT LEG;  Surgeon: Peggye Form, DO;  Location: [facility];   Service: Plastics;  Laterality: Left;  . SLIPPED CAPITAL FEMORAL EPIPHYSIS PINNING      Family History  Problem Relation Age of Onset  . Hypertension Mother   . Hyperlipidemia Mother   . Hypertension Brother   . Asthma Brother     Allergies  Allergen Reactions  . Avelox [Moxifloxacin Hcl In Nacl] Anaphylaxis  . Terbinafine Hcl Swelling  . Zocor [Simvastatin - High Dose] Anaphylaxis  . Montelukast Sodium Other (See Comments)    Insomnia  . Pravastatin Other (See Comments)    Myalgias on 20mg     Current Outpatient Medications on File Prior to Visit  Medication Sig Dispense Refill  . acetaminophen (TYLENOL) 325 MG tablet Take 650 mg by mouth every 6 (six) hours as needed for moderate pain.     Marland Kitchen ALPRAZolam (XANAX) 0.5 MG tablet Take 1 tablet (0.5 mg total) by mouth 2 (two) times daily as needed for anxiety. 60 tablet 2  . amLODipine (NORVASC) 5 MG tablet Take 1 tablet (5 mg total) by mouth daily. 30 tablet 3  . benazepril (LOTENSIN) 20 MG tablet 1 tab po bid 60 tablet 5  . DULoxetine (CYMBALTA) 30 MG capsule 1 capsule po q day 30 capsule 5  . gabapentin (NEURONTIN) 300 MG capsule Take 1 capsule (300 mg total) by mouth at bedtime. 30 capsule 5  . methylPREDNISolone sodium succinate (SOLU-MEDROL) 125 mg/2 mL injection Inject 2 mLs (125 mg total) into the vein every 6 (six) hours. 1 each 0  . ondansetron (ZOFRAN) 4 MG tablet Take 1 tablet (4 mg total) by mouth every 8 (eight) hours as needed for nausea or vomiting. 90 tablet 0  . vitamin C (ASCORBIC ACID) 500 MG tablet Take 500 mg by mouth daily.     No current facility-administered medications on file prior to visit.     BP 123/68   Pulse 74   Temp 98.1 F (36.7 C) (Oral)   Resp 16       Objective:   Physical Exam  General Mental Status- Alert. General Appearance- Not in acute distress.   Neck Carotid Arteries- Normal color. Moisture- Normal Moisture. No carotid bruits. No JVD.  Chest and Lung  Exam Auscultation: Breath Sounds:-Normal.  Cardiovascular Auscultation:Rythm- Regular. Murmurs & Other Heart Sounds:Auscultation of the heart reveals- No Murmurs.  Abdomen Inspection:-Inspeection Normal. Palpation/Percussion:Note:No mass. Palpation and Percussion of the abdomen reveal- Non Tender, Non Distended + BS, no rebound or guarding.    Neurologic Cranial Nerve exam:- CN III-XII intact(No nystagmus), symmetric smile.  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 No changes. Would cut back little on green since at lower range of INR.  For bp continue current med regimen. Well controlled today.  For phantom pain continue with gabapentin.  For anxiety and mood continue with cymbalta and xanax.  Follow up in 6 month or as needed. But one month for repeat INR.  25 minutes spent with pt. 50% of time spent counseling patient chronic conditions.  Esperanza Richters, PA-C

## 2017-11-15 NOTE — Patient Instructions (Addendum)
For hx of dvt continue current regimen of coumadin. No changes. Would cut back little on greens since at lower range of INR.  For bp continue current med regimen. Well controlled today.  For phantom pain continue with gabapentin.  For anxiety and mood continue with cymbalta and xanax.  Follow up in 6 month or as needed.  But one month for repeat INR.

## 2017-11-19 MED FILL — ALPRAZolam 0.5 MG TABS: 0.5 | 30 days supply | Qty: 60 | Fill #2

## 2017-11-19 MED FILL — DULoxetine HCL 30 MG CPEP: 30 | 30 days supply | Qty: 30 | Fill #2

## 2017-12-12 MED FILL — GABAPENTIN 300 MG CAPSULE: 300 | 30 days supply | Qty: 30 | Fill #5

## 2017-12-12 MED FILL — BENAZEPRIL HCL 20 MG TABLET: 20 | 30 days supply | Qty: 60 | Fill #5

## 2017-12-17 ENCOUNTER — Other Ambulatory Visit: Payer: Self-pay | Admitting: Medical

## 2017-12-17 MED FILL — AMLODIPINE BESYLATE 5 MG TA: 5 | 30 days supply | Qty: 30 | Fill #0

## 2017-12-17 MED FILL — WARFARIN SODIUM 5 MG TABLET: 5 | 30 days supply | Qty: 30 | Fill #1

## 2017-12-19 ENCOUNTER — Other Ambulatory Visit: Payer: Self-pay | Admitting: Medical

## 2017-12-19 ENCOUNTER — Ambulatory Visit: Payer: Self-pay

## 2017-12-19 MED FILL — DULoxetine HCL 30 MG CPEP: 30 | 30 days supply | Qty: 30 | Fill #3

## 2017-12-19 NOTE — Telephone Encounter (Signed)
Rx xanax refill sent to pharmacy.

## 2017-12-19 NOTE — Telephone Encounter (Signed)
Last alprazolam RX: 09/17/17, 60 x 2 refill Last OV: 11/15/17 Next OV: due in 6 months, not yet scheduled UDS: 09/17/17, high risk use CSC: 09/17/17 CSR:  No discrepancies identified

## 2017-12-20 ENCOUNTER — Ambulatory Visit (INDEPENDENT_AMBULATORY_CARE_PROVIDER_SITE_OTHER): Payer: Self-pay

## 2017-12-20 DIAGNOSIS — Z7901 Long term (current) use of anticoagulants: Secondary | ICD-10-CM

## 2017-12-20 LAB — POCT INR: INR: 2.4 (ref 2.0–3.0)

## 2017-12-20 MED FILL — ALPRAZolam 0.5 MG TABS: 0.5 | 30 days supply | Qty: 60 | Fill #0

## 2017-12-20 NOTE — Progress Notes (Addendum)
Pre visit review using our clinic tool,if applicable. No additional management support is needed unless otherwise documented below in the visit note.   Pt here for INR check per order from Dr. Carmelia RollerWendling.  Goal INR = 2.0-3.0  Last INR = 2.2  Pt currently takes Coumadin 5 mg daily.  Pt denies recent antibiotics, no dietary changes and no unusual bruising / bleeding.  INR today = 2.4  Pt advised per E. Saguier, no changes in Coumadin dosage and return in 1 month for re-chedk.   Above is what I advsied.Same coumadin regimen. No changes. Follow up in one month.  Esperanza RichtersEdward Saguier, PA-C

## 2018-01-16 ENCOUNTER — Ambulatory Visit: Payer: Self-pay

## 2018-01-23 ENCOUNTER — Other Ambulatory Visit: Payer: Self-pay | Admitting: Medical

## 2018-01-23 MED FILL — BENAZEPRIL HCL 20 MG TABLET: 20 | 30 days supply | Qty: 60 | Fill #0

## 2018-01-23 MED FILL — ALPRAZolam 0.5 MG TABS: 0.5 | 30 days supply | Qty: 60 | Fill #1

## 2018-01-23 MED FILL — GABAPENTIN 300 MG CAPSULE: 300 | 30 days supply | Qty: 30 | Fill #0

## 2018-01-23 MED FILL — DULoxetine HCL 30 MG CPEP: 30 | 30 days supply | Qty: 30 | Fill #4

## 2018-01-24 MED FILL — AMLODIPINE BESYLATE 5 MG TA: 5 | 90 days supply | Qty: 90 | Fill #0

## 2018-01-24 NOTE — Telephone Encounter (Signed)
Pt overdue for INR check? 

## 2018-01-25 MED FILL — WARFARIN SODIUM 5 MG TABLET: 5 | 30 days supply | Qty: 30 | Fill #0

## 2018-02-22 MED FILL — DULoxetine HCL 30 MG CPEP: 30 | 30 days supply | Qty: 30 | Fill #5

## 2018-02-22 MED FILL — ALPRAZolam 0.5 MG TABS: 0.5 | 30 days supply | Qty: 60 | Fill #2

## 2018-02-22 MED FILL — BENAZEPRIL HCL 20 MG TABLET: 20 | 30 days supply | Qty: 60 | Fill #1

## 2018-02-22 MED FILL — GABAPENTIN 300 MG CAPSULE: 300 | 30 days supply | Qty: 30 | Fill #1

## 2018-03-19 ENCOUNTER — Other Ambulatory Visit: Payer: Self-pay | Admitting: Medical

## 2018-03-19 ENCOUNTER — Encounter: Payer: Self-pay | Admitting: Medical

## 2018-03-20 ENCOUNTER — Telehealth: Payer: Self-pay | Admitting: Medical

## 2018-03-20 MED ORDER — AMLODIPINE BESYLATE 5 MG PO TABS: 5.0000 mg | ORAL_TABLET | Freq: Every day | ORAL | 0 refills | Status: DC

## 2018-03-20 MED ORDER — DULOXETINE HCL 30 MG PO CPEP: ORAL_CAPSULE | ORAL | 5 refills | Status: DC

## 2018-03-20 MED ORDER — WARFARIN SODIUM 5 MG PO TABS: ORAL_TABLET | ORAL | 3 refills | Status: DC

## 2018-03-20 MED ORDER — ALPRAZOLAM 0.5 MG PO TABS: 0.5000 mg | ORAL_TABLET | Freq: Two times a day (BID) | ORAL | 0 refills | Status: DC | PRN

## 2018-03-20 NOTE — Telephone Encounter (Signed)
Rx meds she requested she requeste via my chart.See my chart response.

## 2018-03-21 MED FILL — ALPRAZolam 0.5 MG TABS: 0.5 | 30 days supply | Qty: 60 | Fill #0

## 2018-03-21 MED FILL — BENAZEPRIL HCL 20 MG TABLET: 20 | 30 days supply | Qty: 60 | Fill #2

## 2018-03-21 MED FILL — AMLODIPINE BESYLATE 5 MG TA: 5 | 90 days supply | Qty: 90 | Fill #0

## 2018-03-21 MED FILL — GABAPENTIN 300 MG CAPSULE: 300 | 30 days supply | Qty: 30 | Fill #2

## 2018-03-21 MED FILL — DULoxetine HCL 30 MG CPEP: 30 | 30 days supply | Qty: 30 | Fill #0

## 2018-03-21 MED FILL — WARFARIN SODIUM 5 MG TABLET: 5 | 30 days supply | Qty: 30 | Fill #0

## 2018-04-19 MED FILL — BENAZEPRIL HCL 20 MG TABLET: 20 | 30 days supply | Qty: 60 | Fill #3

## 2018-04-19 MED FILL — DULoxetine HCL 30 MG CPEP: 30 | 30 days supply | Qty: 30 | Fill #1

## 2018-04-19 MED FILL — WARFARIN SODIUM 5 MG TABLET: 5 | 30 days supply | Qty: 30 | Fill #1

## 2018-04-19 MED FILL — GABAPENTIN 300 MG CAPSULE: 300 | 30 days supply | Qty: 30 | Fill #3

## 2018-05-03 ENCOUNTER — Ambulatory Visit (INDEPENDENT_AMBULATORY_CARE_PROVIDER_SITE_OTHER): Payer: Self-pay | Admitting: Medical

## 2018-05-03 ENCOUNTER — Encounter: Payer: Self-pay | Admitting: Medical

## 2018-05-03 ENCOUNTER — Other Ambulatory Visit: Payer: Self-pay

## 2018-05-03 VITALS — BP 147/69 | Temp 99.1°F

## 2018-05-03 DIAGNOSIS — Z7901 Long term (current) use of anticoagulants: Secondary | ICD-10-CM

## 2018-05-03 DIAGNOSIS — F411 Generalized anxiety disorder: Secondary | ICD-10-CM

## 2018-05-03 DIAGNOSIS — Z86718 Personal history of other venous thrombosis and embolism: Secondary | ICD-10-CM

## 2018-05-03 DIAGNOSIS — I10 Essential (primary) hypertension: Secondary | ICD-10-CM

## 2018-05-03 MED ORDER — ALPRAZOLAM 0.5 MG PO TABS: 0.5000 mg | ORAL_TABLET | Freq: Two times a day (BID) | ORAL | 1 refills | Status: DC | PRN

## 2018-05-03 MED ORDER — AMLODIPINE BESYLATE 5 MG PO TABS: 5.0000 mg | ORAL_TABLET | Freq: Every day | ORAL | 0 refills | Status: DC

## 2018-05-03 MED FILL — AMLODIPINE BESYLATE 5 MG TA: 5 | 90 days supply | Qty: 90 | Fill #0

## 2018-05-03 MED FILL — ALPRAZolam 0.5 MG TABS: 0.5 | 30 days supply | Qty: 60 | Fill #0

## 2018-05-03 NOTE — Patient Instructions (Addendum)
Patient's blood pressure has been a little bit higher recently and she attributes some of that to being anxious regarding the viral pandemic.  Also she ran out of her amlodipine recently.  So we will send in amlodipine refill to her pharmacy.  Patient is currently on Coumadin for history of DVT.  Her INR has not been done well.  I placed future order for INR today and she will get that done on Monday morning.  I think this would be the safest time for her to get that done.  Discussed risk of not getting INR versus risk of getting virus.  For history of anxiety, I will refill her Xanax.  She is up-to-date on her contract and UDS.  Follow-up date to be determined after lab review.  Probably will be in about 3 months.

## 2018-05-03 NOTE — Progress Notes (Signed)
Subjective:    Patient ID: Rita Watkins, female    DOB: [birth year], 63 y.o.   MRN: [id number]  HPI   Visit done via webex. Pt was at home and I was at clinic in my office.  Pt bp relatively high. No cardiac or neurologic signs or symptoms. Her blood pressure is relatively well controlled high as she is nervous about covid. Also has been out of norvasc  Pt has hx of anxiety. She has been video chatting and doing prayer meeting/church on line. She needs for refills.  Pt is on coumadin. She has inr last done in Jan 15, 2019 and it was  2.5.   Pt had 2 falls in December. She has prosthetic. She went to PT. She states finally gained confidence to walk by mid feb. She is still careful   Review of Systems  Constitutional: Negative for chills, fatigue and fever.  HENT: Negative for congestion.   Respiratory: Negative for cough, choking, chest tightness, shortness of breath and wheezing.   Cardiovascular: Negative for chest pain and palpitations.  Gastrointestinal: Negative for abdominal pain.  Genitourinary: Negative for dysuria.  Musculoskeletal: Negative for myalgias and neck pain.  Skin: Negative for rash.  Hematological: Negative for adenopathy. Does not bruise/bleed easily.  Psychiatric/Behavioral: Negative for behavioral problems, confusion, decreased concentration, dysphoric mood and sleep disturbance. The patient is nervous/anxious.    Past Medical History:  Diagnosis Date  . Allergy   . Anxiety   . Asthma   . COPD (chronic obstructive pulmonary disease) (HCC)   . Depression   . Dizziness 07/19/2016  . Hyperlipidemia   . Hypertension   . Lymphangitis 06/07/2016  . 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 Nausea 07/19/2016  . Necrosis (HCC) 07/19/2016     Social History   Socioeconomic History  . Marital status: Widowed    Spouse name: Not on file  . Number of children: Not on file  . Years of education: high schoo  . Highest education level: Not on file   Occupational History  . Occupation: ADM. ASSISTANT    Employer: [employer]  Social Needs  . Financial resource strain: Not on file  . Food insecurity:    Worry: Not on file    Inability: Not on file  . Transportation needs:    Medical: Not on file    Non-medical: Not on file  Tobacco Use  . Smoking status: Former Smoker    Packs/day: 1.00    Years: 35.00    Pack years: 35.00    Types: Cigarettes    Last attempt to quit: 09/14/2016    Years since quitting: 1.6  . Smokeless tobacco: Never Used  Substance and Sexual Activity  . Alcohol use: No    Alcohol/week: 0.0 standard drinks  . Drug use: No  . Sexual activity: Never  Lifestyle  . Physical activity:    Days per week: Not on file    Minutes per session: Not on file  . Stress: Not on file  Relationships  . Social connections:    Talks on phone: Not on file    Gets together: Not on file    Attends religious service: Not on file    Active member of club or organization: Not on file    Attends meetings of clubs or organizations: Not on file    Relationship status: Not on file  . Intimate partner violence:    Fear of current or ex partner: Not on file    Emotionally  abused: Not on file    Physically abused: Not on file    Forced sexual activity: Not on file  Other Topics Concern  . Not on file  Social History Narrative   Does not exercise.    Past Surgical History:  Procedure Laterality Date  . FOOT SURGERY     Left  . HIP PINNING     3 each hip  . IRRIGATION AND DEBRIDEMENT ABSCESS N/A 06/27/2016   Procedure: IRRIGATION AND DEBRIDEMENT LEFT LEG WITH SOTFT TISSUE BIOPSY;  Surgeon: Berna Bue, MD;  Location: [facility];  Service: General;  Laterality: N/A;  . LYMPH NODE BIOPSY Left 06/09/2016   Procedure: BIOPSY LEFT LEG;  Surgeon: Peggye Form, DO;  Location: [facility];  Service: Plastics;  Laterality: Left;  . SLIPPED CAPITAL FEMORAL EPIPHYSIS PINNING      Family History  Problem Relation Age of Onset  .  Hypertension Mother   . Hyperlipidemia Mother   . Hypertension Brother   . Asthma Brother     Allergies  Allergen Reactions  . Avelox [Moxifloxacin Hcl In Nacl] Anaphylaxis  . Terbinafine Hcl Swelling  . Zocor [Simvastatin - High Dose] Anaphylaxis  . Montelukast Sodium Other (See Comments)    Insomnia  . Pravastatin Other (See Comments)    Myalgias on 20mg     Current Outpatient Medications on File Prior to Visit  Medication Sig Dispense Refill  . acetaminophen (TYLENOL) 325 MG tablet Take 650 mg by mouth every 6 (six) hours as needed for moderate pain.     Marland Kitchen ALPRAZolam (XANAX) 0.5 MG tablet Take 1 tablet (0.5 mg total) by mouth 2 (two) times daily as needed. for anxiety 60 tablet 0  . amLODipine (NORVASC) 5 MG tablet Take 1 tablet (5 mg total) by mouth daily. 90 tablet 0  . benazepril (LOTENSIN) 20 MG tablet 1 tab po bid 60 tablet 5  . DULoxetine (CYMBALTA) 30 MG capsule 1 capsule po q day 30 capsule 5  . gabapentin (NEURONTIN) 300 MG capsule Take 1 capsule (300 mg total) by mouth at bedtime. 30 capsule 5  . methylPREDNISolone sodium succinate (SOLU-MEDROL) 125 mg/2 mL injection Inject 2 mLs (125 mg total) into the vein every 6 (six) hours. 1 each 0  . ondansetron (ZOFRAN) 4 MG tablet Take 1 tablet (4 mg total) by mouth every 8 (eight) hours as needed for nausea or vomiting. 90 tablet 0  . vitamin C (ASCORBIC ACID) 500 MG tablet Take 500 mg by mouth daily.    Marland Kitchen warfarin (COUMADIN) 5 MG tablet TAKE AS DIRECTED BY COUMADIN CLINIC 30 tablet 3   No current facility-administered medications on file prior to visit.     BP (!) 147/69   Temp 99.1 F (37.3 C)       Objective:   Physical Exam  NA      Assessment & Plan:  Patient's blood pressure has been a little bit higher recently and she attributes some of that to being anxious regarding the viral pandemic.  Also she ran out of her amlodipine recently.  So we will send in amlodipine refill to her pharmacy.  Patient is  currently on Coumadin for history of DVT.  Her INR has not been done well.  I placed future order for INR today and she will get that done on Monday morning.  I think this would be the safest time for her to get that done.  Discussed risk of not getting INR versus risk of getting virus.  For history of anxiety, I will refill her Xanax.  She is up-to-date on her contract and UDS.  Follow-up date to be determined after lab review.  Probably will be in about 3 months.  Esperanza Richters, PA-C

## 2018-05-06 ENCOUNTER — Other Ambulatory Visit: Payer: Self-pay

## 2018-05-06 ENCOUNTER — Other Ambulatory Visit (INDEPENDENT_AMBULATORY_CARE_PROVIDER_SITE_OTHER): Payer: Self-pay

## 2018-05-06 DIAGNOSIS — I10 Essential (primary) hypertension: Secondary | ICD-10-CM

## 2018-05-06 LAB — COMPREHENSIVE METABOLIC PANEL
ALT: 11 U/L (ref 0–35)
AST: 15 U/L (ref 0–37)
Albumin: 3.8 g/dL (ref 3.5–5.2)
Alkaline Phosphatase: 131 U/L — ABNORMAL HIGH (ref 39–117)
BUN: 25 mg/dL — ABNORMAL HIGH (ref 6–23)
CO2: 26 mEq/L (ref 19–32)
Calcium: 9.1 mg/dL (ref 8.4–10.5)
Chloride: 104 mEq/L (ref 96–112)
Creatinine, Ser: 0.84 mg/dL (ref 0.40–1.20)
GFR: 68.14 mL/min (ref >60.00)
Glucose, Bld: 77 mg/dL (ref 70–99)
POTASSIUM: 4.6 meq/L (ref 3.5–5.1)
Sodium: 137 mEq/L (ref 135–145)
Total Bilirubin: 0.3 mg/dL (ref 0.2–1.2)
Total Protein: 7.6 g/dL (ref 6.0–8.3)

## 2018-05-08 NOTE — Addendum Note (Signed)
Addended by: Harley Alto on: 05/08/2018 11:10 AM   Modules accepted: Orders

## 2018-05-09 ENCOUNTER — Other Ambulatory Visit: Payer: Self-pay

## 2018-05-09 ENCOUNTER — Encounter: Payer: Self-pay | Admitting: Medical

## 2018-05-09 ENCOUNTER — Other Ambulatory Visit (INDEPENDENT_AMBULATORY_CARE_PROVIDER_SITE_OTHER): Payer: Self-pay

## 2018-05-09 DIAGNOSIS — Z7901 Long term (current) use of anticoagulants: Secondary | ICD-10-CM

## 2018-05-09 DIAGNOSIS — Z86718 Personal history of other venous thrombosis and embolism: Secondary | ICD-10-CM

## 2018-05-09 LAB — PROTIME-INR
INR: 2.3 ratio — ABNORMAL HIGH (ref 0.8–1.0)
Prothrombin Time: 26.2 s — ABNORMAL HIGH (ref 9.6–13.1)

## 2018-05-10 ENCOUNTER — Telehealth: Payer: Self-pay

## 2018-05-10 NOTE — Telephone Encounter (Signed)
Called patient with lab results.  

## 2018-05-17 MED FILL — WARFARIN SODIUM 5 MG TABLET: 5 | 30 days supply | Qty: 30 | Fill #2

## 2018-05-17 MED FILL — BENAZEPRIL HCL 20 MG TABLET: 20 | 30 days supply | Qty: 60 | Fill #4

## 2018-05-17 MED FILL — DULoxetine HCL 30 MG CPEP: 30 | 30 days supply | Qty: 30 | Fill #2

## 2018-05-18 MED FILL — GABAPENTIN 300 MG CAPSULE: 300 | 30 days supply | Qty: 30 | Fill #4

## 2018-06-17 MED FILL — DULoxetine HCL 30 MG CPEP: 30 | 30 days supply | Qty: 30 | Fill #3

## 2018-06-17 MED FILL — WARFARIN SODIUM 5 MG TABLET: 5 | 30 days supply | Qty: 30 | Fill #3

## 2018-06-17 MED FILL — GABAPENTIN 300 MG CAPSULE: 300 | 30 days supply | Qty: 30 | Fill #5

## 2018-06-17 MED FILL — BENAZEPRIL HCL 20 MG TABLET: 20 | 30 days supply | Qty: 60 | Fill #5

## 2018-06-28 ENCOUNTER — Encounter: Payer: Self-pay | Admitting: Medical

## 2018-06-28 MED ORDER — AMLODIPINE BESYLATE 5 MG PO TABS: 5.0000 mg | ORAL_TABLET | Freq: Every day | ORAL | 1 refills | Status: DC

## 2018-06-28 MED FILL — AMLODIPINE BESYLATE 5 MG TA: 5 | 90 days supply | Qty: 90 | Fill #0

## 2018-07-01 ENCOUNTER — Telehealth: Payer: Self-pay | Admitting: Medical

## 2018-07-01 MED ORDER — WARFARIN SODIUM 5 MG PO TABS: ORAL_TABLET | ORAL | 3 refills | Status: DC

## 2018-07-01 MED ORDER — AMLODIPINE BESYLATE 5 MG PO TABS: 5.0000 mg | ORAL_TABLET | Freq: Every day | ORAL | 1 refills | Status: DC

## 2018-07-01 MED ORDER — ALPRAZOLAM 0.5 MG PO TABS: 0.5000 mg | ORAL_TABLET | Freq: Two times a day (BID) | ORAL | 1 refills | Status: DC | PRN

## 2018-07-01 NOTE — Telephone Encounter (Signed)
  rx amlodipine sent to pt pharmacy.

## 2018-07-02 MED FILL — ALPRAZOLAM 0.5 MG TABS: 0.5 | 30 days supply | Qty: 60 | Fill #0

## 2018-07-03 MED FILL — WARFARIN SODIUM 5 MG TABLET: 5 | 30 days supply | Qty: 30 | Fill #0

## 2018-07-29 MED FILL — DULoxetine HCL 30 MG CPEP: 30 | 30 days supply | Qty: 30 | Fill #4

## 2018-07-31 ENCOUNTER — Other Ambulatory Visit: Payer: Self-pay | Admitting: Medical

## 2018-07-31 MED FILL — GABAPENTIN 300 MG CAPSULE: 300 | 30 days supply | Qty: 30 | Fill #0

## 2018-08-02 MED FILL — WARFARIN SODIUM 5 MG TABLET: 5 | 30 days supply | Qty: 30 | Fill #1

## 2018-08-02 MED FILL — ALPRAZOLAM 0.5 MG TABS: 0.5 | 30 days supply | Qty: 60 | Fill #1

## 2018-08-22 ENCOUNTER — Other Ambulatory Visit: Payer: Self-pay | Admitting: Medical

## 2018-08-22 MED FILL — BENAZEPRIL HCL 20 MG TABLET: 20 | 30 days supply | Qty: 60 | Fill #0

## 2018-08-22 MED FILL — DULoxetine HCL 30 MG CPEP: 30 | 30 days supply | Qty: 30 | Fill #5

## 2018-09-06 MED FILL — WARFARIN SODIUM 5 MG TABLET: 5 | 30 days supply | Qty: 30 | Fill #2

## 2018-09-06 MED FILL — AMLODIPINE BESYLATE 5 MG TA: 5 | 90 days supply | Qty: 90 | Fill #1

## 2018-09-06 MED FILL — GABAPENTIN 300 MG CAPSULE: 300 | 30 days supply | Qty: 30 | Fill #1

## 2018-09-19 ENCOUNTER — Other Ambulatory Visit: Payer: Self-pay | Admitting: Medical

## 2018-09-19 MED FILL — BENAZEPRIL HCL 20 MG TABLET: 20 | 30 days supply | Qty: 60 | Fill #1

## 2018-09-19 MED FILL — ALPRAZOLAM 0.5 MG TABS: 0.5 | 30 days supply | Qty: 60 | Fill #1

## 2018-09-20 MED FILL — DULoxetine HCL 30 MG CPEP: 30 | 30 days supply | Qty: 30 | Fill #0

## 2018-10-14 MED FILL — GABAPENTIN 300 MG CAPSULE: 300 | 30 days supply | Qty: 30 | Fill #2

## 2018-10-14 MED FILL — WARFARIN SODIUM 5 MG TABLET: 5 | 30 days supply | Qty: 30 | Fill #3

## 2018-10-21 MED FILL — BENAZEPRIL HCL 20 MG TABLET: 20 | 30 days supply | Qty: 60 | Fill #2

## 2018-10-21 MED FILL — DULoxetine HCL 30 MG CPEP: 30 | 30 days supply | Qty: 30 | Fill #1

## 2018-11-12 ENCOUNTER — Other Ambulatory Visit: Payer: Self-pay | Admitting: Medical

## 2018-11-12 MED FILL — GABAPENTIN 300 MG CAPSULE: 300 | 30 days supply | Qty: 30 | Fill #3

## 2018-11-12 NOTE — Telephone Encounter (Signed)
Pt id due for follow up.

## 2018-11-22 MED FILL — DULoxetine HCL 30 MG CPEP: 30 | 30 days supply | Qty: 30 | Fill #2

## 2018-11-22 MED FILL — BENAZEPRIL HCL 20 MG TABLET: 20 | 30 days supply | Qty: 60 | Fill #3

## 2018-12-17 ENCOUNTER — Other Ambulatory Visit: Payer: Self-pay | Admitting: Medical

## 2018-12-17 ENCOUNTER — Other Ambulatory Visit: Payer: Self-pay

## 2018-12-17 MED FILL — AMLODIPINE BESYLATE 5 MG TA: 5 | 90 days supply | Qty: 90 | Fill #0

## 2018-12-17 MED FILL — GABAPENTIN 300 MG CAPSULE: 300 | 30 days supply | Qty: 30 | Fill #4

## 2018-12-17 MED FILL — BENAZEPRIL HCL 20 MG TABLET: 20 | 30 days supply | Qty: 60 | Fill #4

## 2018-12-17 MED FILL — DULoxetine HCL 30 MG CPEP: 30 | 30 days supply | Qty: 30 | Fill #3

## 2018-12-17 MED FILL — WARFARIN SODIUM 5 MG TABLET: 5 | 30 days supply | Qty: 30 | Fill #1

## 2018-12-17 NOTE — Telephone Encounter (Signed)
Refill Request: Alprazolam  Last RX:07/01/18 Last OV:05/03/18 Next OV:12/18/18 UDS:09/18/18 CSC:09/18/18 CSR:

## 2018-12-18 ENCOUNTER — Ambulatory Visit (INDEPENDENT_AMBULATORY_CARE_PROVIDER_SITE_OTHER): Payer: Medicare Other | Admitting: Medical

## 2018-12-18 ENCOUNTER — Encounter: Payer: Self-pay | Admitting: Medical

## 2018-12-18 VITALS — BP 137/80 | HR 67 | Temp 97.3°F | Resp 16 | Wt 217.0 lb

## 2018-12-18 DIAGNOSIS — R739 Hyperglycemia, unspecified: Secondary | ICD-10-CM

## 2018-12-18 DIAGNOSIS — R600 Localized edema: Secondary | ICD-10-CM

## 2018-12-18 DIAGNOSIS — F411 Generalized anxiety disorder: Secondary | ICD-10-CM

## 2018-12-18 DIAGNOSIS — Z7901 Long term (current) use of anticoagulants: Secondary | ICD-10-CM

## 2018-12-18 DIAGNOSIS — Z23 Encounter for immunization: Secondary | ICD-10-CM | POA: Diagnosis not present

## 2018-12-18 DIAGNOSIS — Z89619 Acquired absence of unspecified leg above knee: Secondary | ICD-10-CM

## 2018-12-18 DIAGNOSIS — I10 Essential (primary) hypertension: Secondary | ICD-10-CM | POA: Diagnosis not present

## 2018-12-18 DIAGNOSIS — Z79899 Other long term (current) drug therapy: Secondary | ICD-10-CM | POA: Diagnosis not present

## 2018-12-18 DIAGNOSIS — G629 Polyneuropathy, unspecified: Secondary | ICD-10-CM

## 2018-12-18 LAB — HEMOGLOBIN A1C: Hgb A1c MFr Bld: 5.4 % (ref 4.6–6.5)

## 2018-12-18 LAB — CBC WITH DIFFERENTIAL/PLATELET
Basophils Absolute: 0 10*3/uL (ref 0.0–0.1)
Basophils Relative: 0.8 % (ref 0.0–3.0)
Eosinophils Absolute: 0.2 10*3/uL (ref 0.0–0.7)
Eosinophils Relative: 3 % (ref 0.0–5.0)
HCT: 37.4 % (ref 36.0–46.0)
Hemoglobin: 12.3 g/dL (ref 12.0–15.0)
Lymphocytes Relative: 31.8 % (ref 12.0–46.0)
Lymphs Abs: 1.9 10*3/uL (ref 0.7–4.0)
MCHC: 32.8 g/dL (ref 30.0–36.0)
MCV: 89.2 fl (ref 78.0–100.0)
Monocytes Absolute: 0.6 10*3/uL (ref 0.1–1.0)
Monocytes Relative: 9.9 % (ref 3.0–12.0)
Neutro Abs: 3.2 10*3/uL (ref 1.4–7.7)
Neutrophils Relative %: 54.5 % (ref 43.0–77.0)
Platelets: 266 10*3/uL (ref 150.0–400.0)
RBC: 4.19 Mil/uL (ref 3.87–5.11)
RDW: 14.5 % (ref 11.5–15.5)
WBC: 5.8 10*3/uL (ref 4.0–10.5)

## 2018-12-18 LAB — COMPREHENSIVE METABOLIC PANEL
ALT: 16 U/L (ref 0–35)
AST: 18 U/L (ref 0–37)
Albumin: 3.9 g/dL (ref 3.5–5.2)
Alkaline Phosphatase: 110 U/L (ref 39–117)
BUN: 19 mg/dL (ref 6–23)
CO2: 27 mEq/L (ref 19–32)
Calcium: 8.8 mg/dL (ref 8.4–10.5)
Chloride: 106 mEq/L (ref 96–112)
Creatinine, Ser: 0.93 mg/dL (ref 0.40–1.20)
GFR: 60.47 mL/min (ref >60.00)
Glucose, Bld: 90 mg/dL (ref 70–99)
Potassium: 5 mEq/L (ref 3.5–5.1)
Sodium: 137 mEq/L (ref 135–145)
Total Bilirubin: 0.3 mg/dL (ref 0.2–1.2)
Total Protein: 7.3 g/dL (ref 6.0–8.3)

## 2018-12-18 LAB — POCT INR: INR: 3.4 — AB (ref 2.0–3.0)

## 2018-12-18 LAB — LIPID PANEL
Cholesterol: 296 mg/dL — ABNORMAL HIGH (ref 0–200)
HDL: 42 mg/dL (ref >39.00)
NonHDL: 254.48
Total CHOL/HDL Ratio: 7
Triglycerides: 227 mg/dL — ABNORMAL HIGH (ref 0.0–149.0)
VLDL: 45.4 mg/dL — ABNORMAL HIGH (ref 0.0–40.0)

## 2018-12-18 LAB — BRAIN NATRIURETIC PEPTIDE: Pro B Natriuretic peptide (BNP): 88 pg/mL (ref 0.0–100.0)

## 2018-12-18 LAB — LDL CHOLESTEROL, DIRECT: Direct LDL: 179 mg/dL

## 2018-12-18 MED ORDER — GABAPENTIN 300 MG PO CAPS: 300.0000 mg | ORAL_CAPSULE | Freq: Every day | ORAL | 5 refills | Status: DC

## 2018-12-18 MED ORDER — DULOXETINE HCL 30 MG PO CPEP: ORAL_CAPSULE | ORAL | 5 refills | Status: DC

## 2018-12-18 MED ORDER — WARFARIN SODIUM 5 MG PO TABS: ORAL_TABLET | ORAL | 3 refills | Status: DC

## 2018-12-18 MED ORDER — ALPRAZOLAM 0.5 MG PO TABS: 0.5000 mg | ORAL_TABLET | Freq: Two times a day (BID) | ORAL | 2 refills | Status: DC | PRN

## 2018-12-18 MED ORDER — AMLODIPINE BESYLATE 5 MG PO TABS: 5.0000 mg | ORAL_TABLET | Freq: Every day | ORAL | 1 refills | Status: DC

## 2018-12-18 MED FILL — ALPRAZolam 0.5 MG TABS: 0.5 | 30 days supply | Qty: 60 | Fill #0

## 2018-12-18 NOTE — Patient Instructions (Addendum)
For your history of anxiety, will refill your Xanax and have the updated controlled medication contract as well as give UDS.  For history of DVT and elevated INR, I want you to not use warfarin tomorrow and reduce your Friday dose to 2.5mg .  Then on Saturday resume 5mg .  Schedule repeat INR next Wednesday or Thursday.  For elevated sugar history, will get A1c today.  For history of neuropathy and phantom pain post amputation, continue with gabapentin.  Your blood pressure is better on recheck today.  Continue current BP medication regimen.  For recent minimal pedal edema, will get BMP and go ahead and get chest x-ray.  Other labs today will include CBC, CMP and lipid panel.  Follow-up next Wednesday or Thursday for INR.  We will get flu vaccine and PCV 13 today.

## 2018-12-18 NOTE — Progress Notes (Signed)
Subjective:    Patient ID: Rita Watkins, female    DOB: April 27, 1955, 63 y.o.   MRN: [id number]  HPI  Pt in for follow up.   Pt inr is elevated today at level 3.4. She is on 5 mg once a day.   Pt got flu vaccine today.   Pt has hx of anxiety. She uses xanax. She was on contract and gave uds. But expired since August 2019. So needs update contract today and will give uds.  Pt has hx of htn. She is on amlodipine and lotensin.  Pt had cologuard in 2019 and was negative. Pt states someone at [facility] rehab had ordered that study.   Pt had been on cymbalta when she was hospitalized when had amputation. She states has helped her mood.  She is on gabapentin and helps with phantom pains per her report.   Pt has some pedal edema rt leg near ankle.   Review of Systems  Constitutional: Negative for chills, fatigue and fever.  Respiratory: Negative for cough, chest tightness, shortness of breath and wheezing.   Cardiovascular: Negative for chest pain and palpitations.  Gastrointestinal: Negative for abdominal pain and nausea.  Musculoskeletal: Negative for gait problem.       Rt leg- mild rt lower ext pedal edema. Near ankle.  Neurological: Negative for dizziness, seizures, syncope, weakness and headaches.  Hematological: Negative for adenopathy. Does not bruise/bleed easily.  Psychiatric/Behavioral: Negative for behavioral problems and confusion. The patient is not nervous/anxious.     Past Medical History:  Diagnosis Date  . Allergy   . Anxiety   . Asthma   . COPD (chronic obstructive pulmonary disease) (HCC)   . Depression   . Dizziness 07/19/2016  . Hyperlipidemia   . Hypertension   . Lymphangitis 06/07/2016  . 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 Nausea 07/19/2016  . Necrosis (HCC) 07/19/2016     Social History   Socioeconomic History  . Marital status: Widowed    Spouse name: Not on file  . Number of children: Not on file  . Years of education: high schoo   . Highest education level: Not on file  Occupational History  . Occupation: ADM. ASSISTANT    Employer: [employer]  Social Needs  . Financial resource strain: Not on file  . Food insecurity    Worry: Not on file    Inability: Not on file  . Transportation needs    Medical: Not on file    Non-medical: Not on file  Tobacco Use  . Smoking status: Former Smoker    Packs/day: 1.00    Years: 35.00    Pack years: 35.00    Types: Cigarettes    Quit date: 09/14/2016    Years since quitting: 2.2  . Smokeless tobacco: Never Used  Substance and Sexual Activity  . Alcohol use: No    Alcohol/week: 0.0 standard drinks  . Drug use: No  . Sexual activity: Never  Lifestyle  . Physical activity    Days per week: Not on file    Minutes per session: Not on file  . Stress: Not on file  Relationships  . Social Musician on phone: Not on file    Gets together: Not on file    Attends religious service: Not on file    Active member of club or organization: Not on file    Attends meetings of clubs or organizations: Not on file    Relationship status: Not on  file  . Intimate partner violence    Fear of current or ex partner: Not on file    Emotionally abused: Not on file    Physically abused: Not on file    Forced sexual activity: Not on file  Other Topics Concern  . Not on file  Social History Narrative   Does not exercise.    Past Surgical History:  Procedure Laterality Date  . FOOT SURGERY     Left  . HIP PINNING     3 each hip  . IRRIGATION AND DEBRIDEMENT ABSCESS N/A 06/27/2016   Procedure: IRRIGATION AND DEBRIDEMENT LEFT LEG WITH SOTFT TISSUE BIOPSY;  Surgeon: Berna Bueonnor, Chelsea A, MD;  Location: [facility];  Service: General;  Laterality: N/A;  . LYMPH NODE BIOPSY Left 06/09/2016   Procedure: BIOPSY LEFT LEG;  Surgeon: Peggye Formillingham, Claire S, DO;  Location: [facility];  Service: Plastics;  Laterality: Left;  . SLIPPED CAPITAL FEMORAL EPIPHYSIS PINNING      Family History  Problem  Relation Age of Onset  . Hypertension Mother   . Hyperlipidemia Mother   . Hypertension Brother   . Asthma Brother     Allergies  Allergen Reactions  . Avelox [Moxifloxacin Hcl In Nacl] Anaphylaxis  . Terbinafine Hcl Swelling  . Zocor [Simvastatin - High Dose] Anaphylaxis  . Montelukast Sodium Other (See Comments)    Insomnia  . Pravastatin Other (See Comments)    Myalgias on 20mg     Current Outpatient Medications on File Prior to Visit  Medication Sig Dispense Refill  . ALPRAZolam (XANAX) 0.5 MG tablet Take 1 tablet (0.5 mg total) by mouth 2 (two) times daily as needed. for anxiety 60 tablet 1  . amLODipine (NORVASC) 5 MG tablet Take 1 tablet (5 mg total) by mouth daily. 90 tablet 1  . benazepril (LOTENSIN) 20 MG tablet TAKE 1 TABLET BY MOUTH TWICE DAILY 60 tablet 5  . DULoxetine (CYMBALTA) 30 MG capsule TAKE 1 CAPSULE BY MOUTH ONCE DAILY 30 capsule 5  . gabapentin (NEURONTIN) 300 MG capsule TAKE 1 CAPSULE (300 MG TOTAL) BY MOUTH AT BEDTIME. 30 capsule 5  . methylPREDNISolone sodium succinate (SOLU-MEDROL) 125 mg/2 mL injection Inject 2 mLs (125 mg total) into the vein every 6 (six) hours. 1 each 0  . warfarin (COUMADIN) 5 MG tablet TAKE AS DIRECTED BY COUMADIN CLINIC 30 tablet 3   No current facility-administered medications on file prior to visit.     BP (!) 150/49   Pulse 62   Temp (!) 97.3 F (36.3 C) (Temporal)   Resp 16   SpO2 100%       Objective:   Physical Exam  General Mental Status- Alert. General Appearance- Not in acute distress.   Skin General: Color- Normal Color. Moisture- Normal Moisture.  Neck Carotid Arteries- Normal color. Moisture- Normal Moisture. No carotid bruits. No JVD.  Chest and Lung Exam Auscultation: Breath Sounds:-Normal.  Cardiovascular Auscultation:Rythm- Regular. Murmurs & Other Heart Sounds:Auscultation of the heart reveals- No Murmurs.  Abdomen Inspection:-Inspeection Normal. Palpation/Percussion:Note:No mass.  Palpation and Percussion of the abdomen reveal- Non Tender, Non Distended + BS, no rebound or guarding.   Neurologic Cranial Nerve exam:- CN III-XII intact(No nystagmus), symmetric smile. Strength:- 5/5 equal and symmetric strength both upper and lower extremities.  Rt lower ext- faint 1+ pedal edama lateral aspect of calf. Negative homan sign.  Left lower ext- prosthesis.       Assessment & Plan:  For your history of anxiety, will refill your Xanax and  have the updated controlled medication contract as well as give UDS.  For history of DVT and elevated INR, I want you to not use warfarin tomorrow and reduce your Friday dose to 2.5mg .  Then on Saturday resume 5mg .  Schedule repeat INR next Wednesday or Thursday.  For elevated sugar history, will get A1c today.  For history of neuropathy and phantom pain post amputation, continue with gabapentin.  Your blood pressure is better on recheck today.  Continue current BP medication regimen.  For recent minimal pedal edema, will get BMP and go ahead and get chest x-ray.  Other labs today will include CBC, CMP and lipid panel.  Follow-up next Wednesday or Thursday for INR.  We will get flu vaccine and PCV 13 today.  40 minutes spent with patient.  50% time spent counseling patient on plan going forward.

## 2018-12-19 NOTE — Telephone Encounter (Signed)
Where is the note that's says last fill 12/18/18. The last refill I see is 07/01/18 with one refill.

## 2018-12-19 NOTE — Telephone Encounter (Signed)
I tried to send this rx to pt pharmacy yesterday. But note on message sent to me states she filled this on 12/17/2018. So will you call pharmacy and cancel the prescription I sent yesterday.

## 2018-12-21 LAB — DRUG TOX MONITOR 1 W/CONF, ORAL FLD

## 2018-12-21 NOTE — Telephone Encounter (Signed)
On med list states 12/18/2018 benzo was sent to pharmacy with 2 refills. So If pt requesting refill can't rx. I was sent a refill request. See medication list.

## 2018-12-26 ENCOUNTER — Ambulatory Visit: Payer: Medicare Other

## 2019-01-16 MED FILL — ALPRAZolam 0.5 MG TABS: 0.5 | 30 days supply | Qty: 60 | Fill #1

## 2019-01-16 MED FILL — WARFARIN SODIUM 5 MG TABLET: 5 | 30 days supply | Qty: 30 | Fill #2

## 2019-01-16 MED FILL — BENAZEPRIL HCL 20 MG TABLET: 20 | 30 days supply | Qty: 60 | Fill #5

## 2019-01-16 MED FILL — DULoxetine HCL 30 MG CPEP: 30 | 30 days supply | Qty: 30 | Fill #4

## 2019-01-16 MED FILL — GABAPENTIN 300 MG CAPSULE: 300 | 30 days supply | Qty: 30 | Fill #5

## 2019-02-17 ENCOUNTER — Other Ambulatory Visit: Payer: Self-pay | Admitting: Medical

## 2019-02-17 MED FILL — WARFARIN SODIUM 5 MG TABLET: 5 | 30 days supply | Qty: 30 | Fill #3

## 2019-02-17 MED FILL — GABAPENTIN 300 MG CAPSULE: 300 | 30 days supply | Qty: 30 | Fill #0

## 2019-02-17 MED FILL — ALPRAZolam 0.5 MG TABS: 0.5 | 30 days supply | Qty: 60 | Fill #2

## 2019-02-17 MED FILL — BENAZEPRIL HCL 20 MG TABLET: 20 | 30 days supply | Qty: 60 | Fill #0

## 2019-02-17 MED FILL — DULoxetine HCL 30 MG CPEP: 30 | 30 days supply | Qty: 30 | Fill #5

## 2019-03-18 MED FILL — GABAPENTIN 300 MG CAPSULE: 300 | 30 days supply | Qty: 30 | Fill #1

## 2019-03-18 MED FILL — BENAZEPRIL HCL 20 MG TABLET: 20 | 30 days supply | Qty: 60 | Fill #1

## 2019-03-18 MED FILL — DULoxetine HCL 30 MG CPEP: 30 | 30 days supply | Qty: 30 | Fill #0

## 2019-03-18 MED FILL — WARFARIN SODIUM 5 MG TABLET: 5 | 30 days supply | Qty: 30 | Fill #0

## 2019-04-14 MED FILL — DULoxetine HCL 30 MG CPEP: 30 | 30 days supply | Qty: 30 | Fill #1

## 2019-04-14 MED FILL — AMLODIPINE BESYLATE 5 MG TA: 5 | 90 days supply | Qty: 90 | Fill #1

## 2019-04-14 MED FILL — WARFARIN SODIUM 5 MG TABLET: 5 | 30 days supply | Qty: 30 | Fill #1

## 2019-04-14 MED FILL — GABAPENTIN 300 MG CAPSULE: 300 | 30 days supply | Qty: 30 | Fill #2

## 2019-04-14 MED FILL — BENAZEPRIL HCL 20 MG TABLET: 20 | 30 days supply | Qty: 60 | Fill #2

## 2019-05-14 MED FILL — BENAZEPRIL HCL 20 MG TABLET: 20 | 30 days supply | Qty: 60 | Fill #3

## 2019-05-14 MED FILL — GABAPENTIN 300 MG CAPSULE: 300 | 30 days supply | Qty: 30 | Fill #3

## 2019-05-14 MED FILL — DULoxetine HCL 30 MG CPEP: 30 | 30 days supply | Qty: 30 | Fill #2

## 2019-05-14 MED FILL — WARFARIN SODIUM 5 MG TABLET: 5 | 30 days supply | Qty: 30 | Fill #2

## 2019-05-23 ENCOUNTER — Emergency Department (HOSPITAL_BASED_OUTPATIENT_CLINIC_OR_DEPARTMENT_OTHER): Payer: Medicare Other

## 2019-05-23 ENCOUNTER — Other Ambulatory Visit: Payer: Self-pay

## 2019-05-23 ENCOUNTER — Emergency Department (HOSPITAL_BASED_OUTPATIENT_CLINIC_OR_DEPARTMENT_OTHER)
Admission: EM | Admit: 2019-05-23 | Discharge: 2019-05-23 | Disposition: A | Discharge status: Home / Self Care | Payer: Medicare Other | Attending: Emergency Medicine | Admitting: Emergency Medicine

## 2019-05-23 ENCOUNTER — Encounter (HOSPITAL_BASED_OUTPATIENT_CLINIC_OR_DEPARTMENT_OTHER): Payer: Self-pay | Admitting: *Deleted

## 2019-05-23 DIAGNOSIS — S5012XA Contusion of left forearm, initial encounter: Secondary | ICD-10-CM | POA: Insufficient documentation

## 2019-05-23 DIAGNOSIS — S59912A Unspecified injury of left forearm, initial encounter: Secondary | ICD-10-CM | POA: Diagnosis present

## 2019-05-23 DIAGNOSIS — Y929 Unspecified place or not applicable: Secondary | ICD-10-CM | POA: Diagnosis not present

## 2019-05-23 DIAGNOSIS — I10 Essential (primary) hypertension: Secondary | ICD-10-CM | POA: Diagnosis not present

## 2019-05-23 DIAGNOSIS — W0110XA Fall on same level from slipping, tripping and stumbling with subsequent striking against unspecified object, initial encounter: Secondary | ICD-10-CM | POA: Diagnosis not present

## 2019-05-23 DIAGNOSIS — Z79899 Other long term (current) drug therapy: Secondary | ICD-10-CM | POA: Insufficient documentation

## 2019-05-23 DIAGNOSIS — Y999 Unspecified external cause status: Secondary | ICD-10-CM | POA: Diagnosis not present

## 2019-05-23 DIAGNOSIS — J45909 Unspecified asthma, uncomplicated: Secondary | ICD-10-CM | POA: Insufficient documentation

## 2019-05-23 DIAGNOSIS — Z89612 Acquired absence of left leg above knee: Secondary | ICD-10-CM | POA: Insufficient documentation

## 2019-05-23 DIAGNOSIS — J449 Chronic obstructive pulmonary disease, unspecified: Secondary | ICD-10-CM | POA: Diagnosis not present

## 2019-05-23 DIAGNOSIS — Z7901 Long term (current) use of anticoagulants: Secondary | ICD-10-CM | POA: Insufficient documentation

## 2019-05-23 DIAGNOSIS — Y939 Activity, unspecified: Secondary | ICD-10-CM | POA: Insufficient documentation

## 2019-05-23 DIAGNOSIS — Z87891 Personal history of nicotine dependence: Secondary | ICD-10-CM | POA: Diagnosis not present

## 2019-05-23 LAB — PROTIME-INR
INR: 2.9 — ABNORMAL HIGH (ref 0.8–1.2)
Prothrombin Time: 30 seconds — ABNORMAL HIGH (ref 11.4–15.2)

## 2019-05-23 NOTE — ED Triage Notes (Signed)
She fell 2 days ago. Injury to her left forearm. Bruising noted. 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 She wears a prosthesis.

## 2019-05-23 NOTE — ED Provider Notes (Signed)
[facility] EMERGENCY DEPARTMENT Provider Note   CSN: [id number] Arrival date & time: 05/23/19  1357     History Chief Complaint  Patient presents with  . Fall    Rita Watkins is a 64 y.o. female.  The history is provided by the patient. No language interpreter was used.  Fall This is a new problem. The current episode started 2 days ago. The problem occurs constantly. Nothing aggravates the symptoms. Nothing relieves the symptoms. She has tried nothing for the symptoms. The treatment provided no relief.  Pt complains of pain in her left forearm, Pt is on coumadin. Pt reports she has a hematoma.      Past Medical History:  Diagnosis Date  . Allergy   . Anxiety   . Asthma   . COPD (chronic obstructive pulmonary disease) (HCC)   . Depression   . Dizziness 07/19/2016  . Hyperlipidemia   . Hypertension   . Lymphangitis 06/07/2016  . 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 Nausea 07/19/2016  . Necrosis (HCC) 07/19/2016    Patient Active Problem List   Diagnosis Date Noted  . 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 Benign essential HTN 08/14/2016  . Mycobacterial infection 06/07/2016  . Leukocytosis 07/30/2014  . Cigarette smoker 07/15/2014  . Depression with anxiety 09/13/2012    Past Surgical History:  Procedure Laterality Date  . FOOT SURGERY     Left  . HIP PINNING     3 each hip  . IRRIGATION AND DEBRIDEMENT ABSCESS N/A 06/27/2016   Procedure: IRRIGATION AND DEBRIDEMENT LEFT LEG WITH SOTFT TISSUE BIOPSY;  Surgeon: Berna Bue, MD;  Location: [facility];  Service: General;  Laterality: N/A;  . LYMPH NODE BIOPSY Left 06/09/2016   Procedure: BIOPSY LEFT LEG;  Surgeon: Peggye Form, DO;  Location: [facility];  Service: Plastics;  Laterality: Left;  . SLIPPED CAPITAL FEMORAL EPIPHYSIS PINNING       OB History   No obstetric history on file.     Family History  Problem Relation Age of Onset  . Hypertension Mother   . Hyperlipidemia Mother   .  Hypertension Brother   . Asthma Brother     Social History   Tobacco Use  . Smoking status: Former Smoker    Packs/day: 1.00    Years: 35.00    Pack years: 35.00    Types: Cigarettes    Quit date: 09/14/2016    Years since quitting: 2.6  . Smokeless tobacco: Never Used  Substance Use Topics  . Alcohol use: No    Alcohol/week: 0.0 standard drinks  . Drug use: No    Home Medications Prior to Admission medications   Medication Sig Start Date End Date Taking? Authorizing Provider  ALPRAZolam Prudy Feeler) 0.5 MG tablet Take 1 tablet (0.5 mg total) by mouth 2 (two) times daily as needed. for anxiety 12/18/18  Yes Saguier, Ramon Dredge, PA-C  amLODipine (NORVASC) 5 MG tablet Take 1 tablet (5 mg total) by mouth daily. 12/18/18  Yes Saguier, Ramon Dredge, PA-C  benazepril (LOTENSIN) 20 MG tablet TAKE 1 TABLET BY MOUTH TWICE DAILY 02/17/19  Yes Saguier, Ramon Dredge, PA-C  DULoxetine (CYMBALTA) 30 MG capsule TAKE 1 CAPSULE BY MOUTH ONCE DAILY 12/18/18  Yes Saguier, Ramon Dredge, PA-C  gabapentin (NEURONTIN) 300 MG capsule Take 1 capsule (300 mg total) by mouth at bedtime. 12/18/18  Yes Saguier, Ramon Dredge, PA-C  warfarin (COUMADIN) 5 MG tablet 5 mg daily 12/18/18  Yes Saguier, Ramon Dredge, PA-C    Allergies  Avelox [moxifloxacin hcl in nacl], Terbinafine hcl, Zocor [simvastatin - high dose], Montelukast sodium, and Pravastatin  Review of Systems   Review of Systems  All other systems reviewed and are negative.   Physical Exam Updated Vital Signs BP (!) 144/62 (BP Location: Right Wrist)   Pulse 66   Temp 98.3 F (36.8 C) (Oral)   Resp 18   Ht 5\' 9"  (1.753 m)   Wt 90.7 kg   SpO2 98%   BMI 29.53 kg/m   Physical Exam Vitals reviewed.  Constitutional:      Appearance: Normal appearance.  Cardiovascular:     Rate and Rhythm: Normal rate.  Pulmonary:     Effort: Pulmonary effort is normal.  Musculoskeletal:        General: Swelling and tenderness present.  Skin:    General: Skin is warm.  Neurological:      General: No focal deficit present.     Mental Status: She is alert.  Psychiatric:        Mood and Affect: Mood normal.     ED Results / Procedures / Treatments   Labs (all labs ordered are listed, but only abnormal results are displayed) Labs Reviewed  PROTIME-INR - Abnormal; Notable for the following components:      Result Value   Prothrombin Time 30.0 (*)    INR 2.9 (*)    All other components within normal limits    EKG None  Radiology DG Forearm Left  Result Date: 05/23/2019 CLINICAL DATA:  Left arm pain and bruising at the elbow following a fall 2 days ago. EXAM: LEFT FOREARM - 2 VIEW COMPARISON:  None. FINDINGS: The bones appear osteopenic. No fracture or dislocation seen. No visible elbow joint effusion. IMPRESSION: No fracture or dislocation. Electronically Signed   By: Claudie Revering M.D.   On: 05/23/2019 15:00    Procedures Procedures (including critical care time)  Medications Ordered in ED Medications - No data to display  ED Course  I have reviewed the triage vital signs and the nursing notes.  Pertinent labs & imaging results that were available during my care of the patient were reviewed by me and considered in my medical decision making (see chart for details).    MDM Rules/Calculators/A&P                      MDM:  Inr is 2.9  Xray left forearm is negative  Final Clinical Impression(s) / ED Diagnoses Final diagnoses:  Traumatic hematoma of left forearm, initial encounter    Rx / DC Orders ED Discharge Orders    None    Pt placed in an ace wrap.  Pt counseled on hematoma's  An After Visit Summary was printed and given to the patient.    Sidney Ace 05/23/19 1933    Truddie Hidden, MD 05/23/19 [phone]

## 2019-05-24 ENCOUNTER — Encounter: Payer: Self-pay | Admitting: Medical

## 2019-06-13 ENCOUNTER — Other Ambulatory Visit: Payer: Self-pay | Admitting: Medical

## 2019-06-13 DIAGNOSIS — F411 Generalized anxiety disorder: Secondary | ICD-10-CM

## 2019-06-13 MED FILL — ALPRAZolam 0.5 MG TABS: 0.5 | 30 days supply | Qty: 60 | Fill #0

## 2019-06-13 MED FILL — DULoxetine HCL 30 MG CPEP: 30 | 30 days supply | Qty: 30 | Fill #3

## 2019-06-13 MED FILL — GABAPENTIN 300 MG CAPSULE: 300 | 30 days supply | Qty: 30 | Fill #4

## 2019-06-13 MED FILL — BENAZEPRIL HCL 20 MG TABLET: 20 | 30 days supply | Qty: 60 | Fill #4

## 2019-06-13 MED FILL — WARFARIN SODIUM 5 MG TABLET: 5 | 30 days supply | Qty: 30 | Fill #3

## 2019-06-13 NOTE — Telephone Encounter (Signed)
Requesting: xanax Contract:12/26/2018 UDS:12/18/18 Last Visit:12/18/18 Next Visit:n/a Last Refill: 12/18/18  Please Advise

## 2019-06-13 NOTE — Telephone Encounter (Signed)
Refilled her xanax. But he needs controlled med visit within the next month.

## 2019-07-22 ENCOUNTER — Ambulatory Visit: Payer: Medicare Other | Admitting: Medical

## 2019-07-28 ENCOUNTER — Ambulatory Visit: Payer: Medicare Other | Admitting: Medical

## 2019-08-04 ENCOUNTER — Other Ambulatory Visit: Payer: Self-pay | Admitting: Medical

## 2019-08-04 ENCOUNTER — Ambulatory Visit (INDEPENDENT_AMBULATORY_CARE_PROVIDER_SITE_OTHER): Payer: Medicare Other | Admitting: Medical

## 2019-08-04 ENCOUNTER — Other Ambulatory Visit: Payer: Self-pay

## 2019-08-04 VITALS — BP 129/58 | HR 60 | Temp 97.8°F | Resp 16 | Ht 69.0 in

## 2019-08-04 DIAGNOSIS — E875 Hyperkalemia: Secondary | ICD-10-CM | POA: Diagnosis not present

## 2019-08-04 DIAGNOSIS — I10 Essential (primary) hypertension: Secondary | ICD-10-CM

## 2019-08-04 DIAGNOSIS — F411 Generalized anxiety disorder: Secondary | ICD-10-CM

## 2019-08-04 DIAGNOSIS — G629 Polyneuropathy, unspecified: Secondary | ICD-10-CM

## 2019-08-04 DIAGNOSIS — R600 Localized edema: Secondary | ICD-10-CM

## 2019-08-04 DIAGNOSIS — Z86718 Personal history of other venous thrombosis and embolism: Secondary | ICD-10-CM | POA: Diagnosis not present

## 2019-08-04 LAB — POCT INR: INR: 1.3 — AB (ref 2.0–3.0)

## 2019-08-04 MED ORDER — GABAPENTIN 300 MG PO CAPS: 300.0000 mg | ORAL_CAPSULE | Freq: Every day | ORAL | 5 refills | Status: DC

## 2019-08-04 MED ORDER — AMLODIPINE BESYLATE 5 MG PO TABS: 5.0000 mg | ORAL_TABLET | Freq: Every day | ORAL | 1 refills | Status: DC

## 2019-08-04 MED ORDER — WARFARIN SODIUM 5 MG PO TABS: ORAL_TABLET | ORAL | 3 refills | Status: DC

## 2019-08-04 MED ORDER — DULOXETINE HCL 30 MG PO CPEP: ORAL_CAPSULE | ORAL | 5 refills | Status: DC

## 2019-08-04 MED ORDER — ALPRAZOLAM 0.5 MG PO TABS: 0.5000 mg | ORAL_TABLET | Freq: Two times a day (BID) | ORAL | 3 refills | Status: DC | PRN

## 2019-08-04 MED ORDER — BENAZEPRIL HCL 20 MG PO TABS: 20.0000 mg | ORAL_TABLET | Freq: Two times a day (BID) | ORAL | 5 refills | Status: DC

## 2019-08-04 MED FILL — AMLODIPINE BESYLATE 5 MG TA: 5 | 90 days supply | Qty: 90 | Fill #0

## 2019-08-04 MED FILL — DULoxetine HCL 30 MG CPEP: 30 | 30 days supply | Qty: 30 | Fill #0

## 2019-08-04 MED FILL — BENAZEPRIL HCL 20 MG TABLET: 20 | 30 days supply | Qty: 60 | Fill #0

## 2019-08-04 MED FILL — WARFARIN SODIUM 5 MG TABLET: 5 | 45 days supply | Qty: 60 | Fill #0

## 2019-08-04 NOTE — Progress Notes (Signed)
° °  Subjective:    Patient ID: Rita Watkins, female    DOB: 1955/05/16, 64 y.o.   MRN: [id number]  HPI Pt in for follow up. She has history of dvt and PE. Years ago and has been on coumadin since.  Pt had hx of inferior vena cava filter. She states  also had filter in rt leg. She states had filter in her left leg before was amputated.  Pt stopped smoking 3 years ago August 15, 2019.  Pt wants to know if she can use xarelto or eliquis. She had insurance but does not have insurance coverage. So discussed with pt best to stay with coumadin.  Pt has some pedal edema. Going on past 3-4 weeks. No sob or wheezing. No chest pain.   Pt INR is usually on higher end. Today we initially got 1.3. she states at [facility] last night.   Pt has anxiety. She is up to date on controlled med contract and uds.  On xanax and due for refill August 15, 2019.  Gabapentin helps with phantom pain left lower ext.  Bp well controlled. On amlodipine and lotension.     Review of Systems  Constitutional: Negative for chills, diaphoresis and fatigue.  HENT: Negative for congestion.   Respiratory: Negative for cough, chest tightness, shortness of breath and wheezing.   Cardiovascular: Negative for chest pain and palpitations.  Gastrointestinal: Negative for abdominal pain.  Musculoskeletal:       Rt lower ext foot pedal edema.  Neurological: Negative for dizziness, seizures, weakness and headaches.  Psychiatric/Behavioral: Positive for dysphoric mood. Negative for behavioral problems and decreased concentration. The patient is nervous/anxious.        Controlled with xanax. And cymbalta.  Dysphoric mood in past. Controlled presently.       Objective:   Physical Exam  General Mental Status- Alert. General Appearance- Not in acute distress.   Skin General: Color- Normal Color. Moisture- Normal Moisture.  Neck Carotid Arteries- Normal color. Moisture- Normal Moisture. No carotid bruits. No JVD.  Chest and  Lung Exam Auscultation: Breath Sounds:-Normal.  Cardiovascular Auscultation:Rythm- Regular. Murmurs & Other Heart Sounds:Auscultation of the heart reveals- No Murmurs.  Abdomen Inspection:-Inspeection Normal. Palpation/Percussion:Note:No mass. Palpation and Percussion of the abdomen reveal- Non Tender, Non Distended + BS, no rebound or guarding.   Neurologic Cranial Nerve exam:- CN III-XII intact(No nystagmus), symmetric smile. Strength:- 5/5 equal and symmetric strength both upper and lower extremities.  Rt lower ext- no calf swelling. Foot mild 1+ pedal edema. Negative homans signs.       Assessment & Plan:  Bp well controlled. Refilled amlodipine and benzapril.  For hx of dvt and PE, continue warfarin. Refill today.  Since INR low dose adjustment made and will repeat inr in one week.  For phantom pain/nerve pain refilled gabapentin.  For anxiety refilled your xanax. Up to date on contract and uds. Can fill on August 13, 2019.  Also for mood and anxiety rx cymbalta.  For pedal edema put in cmp, cbc, bnp and cxr. Please get that done tomorrow.  Follow up one week for inr check or as needed.  Esperanza Richters, PA-C   Time spent with patient today was 40  minutes which consisted of chart review, discussing diagnosis, work up, treatment and documentation.  Will send note to supervising DO to review controlled med xanax rx.

## 2019-08-04 NOTE — Patient Instructions (Addendum)
Bp well controlled. Refilled amlodipine and benzapril.  For hx of dvt and PE, continue warfarin. Refill today. Since INR low dose adjustment made and will repeat inr in one week.  For phantom pain/nerve pain refilled gabapentin.  For anxiety refilled your xanax. Up to date on contract and uds. Can fill on August 13, 2019.  Also for mood and anxiety rx cymbalta.  For pedal edema put in cmp, cbc, bnp and cxr. Please get that done tomorrow.  Follow up one week for inr check or as needed.

## 2019-08-05 ENCOUNTER — Ambulatory Visit: Payer: Medicare Other

## 2019-08-05 ENCOUNTER — Telehealth: Payer: Self-pay | Admitting: Medical

## 2019-08-05 ENCOUNTER — Other Ambulatory Visit: Payer: Self-pay | Admitting: Medical

## 2019-08-05 LAB — COMPREHENSIVE METABOLIC PANEL
ALT: 17 IU/L (ref 0–32)
AST: 19 IU/L (ref 0–40)
Albumin/Globulin Ratio: 1.2 (ref 1.2–2.2)
Albumin: 3.7 g/dL — ABNORMAL LOW (ref 3.8–4.8)
Alkaline Phosphatase: 106 IU/L (ref 48–121)
BUN/Creatinine Ratio: 19 (ref 12–28)
BUN: 18 mg/dL (ref 8–27)
Bilirubin Total: 0.4 mg/dL (ref 0.0–1.2)
CO2: 25 mmol/L (ref 20–29)
Calcium: 8.9 mg/dL (ref 8.7–10.3)
Chloride: 105 mmol/L (ref 96–106)
Creatinine, Ser: 0.96 mg/dL (ref 0.57–1.00)
GFR calc Af Amer: 72 mL/min/{1.73_m2} (ref >59)
GFR calc non Af Amer: 62 mL/min/{1.73_m2} (ref >59)
Globulin, Total: 3.1 g/dL (ref 1.5–4.5)
Glucose: 88 mg/dL (ref 65–99)
Potassium: 4.9 mmol/L (ref 3.5–5.2)
Sodium: 139 mmol/L (ref 134–144)
Total Protein: 6.8 g/dL (ref 6.0–8.5)

## 2019-08-05 LAB — CBC WITH DIFFERENTIAL/PLATELET
Basophils Absolute: 0 10*3/uL (ref 0.0–0.2)
Basos: 0 %
EOS (ABSOLUTE): 0.1 10*3/uL (ref 0.0–0.4)
Eos: 3 %
Hematocrit: 36.1 % (ref 34.0–46.6)
Hemoglobin: 11.9 g/dL (ref 11.1–15.9)
Lymphocytes Absolute: 1.8 10*3/uL (ref 0.7–3.1)
Lymphs: 31 %
MCH: 29.1 pg (ref 26.6–33.0)
MCHC: 33 g/dL (ref 31.5–35.7)
MCV: 88 fL (ref 79–97)
Monocytes Absolute: 0.5 10*3/uL (ref 0.1–0.9)
Monocytes: 9 %
Neutrophils Absolute: 3.2 10*3/uL (ref 1.4–7.0)
Neutrophils: 57 %
Platelets: 258 10*3/uL (ref 150–450)
RBC: 4.09 x10E6/uL (ref 3.77–5.28)
RDW: 14 % (ref 11.7–15.4)
WBC: 5.6 10*3/uL (ref 3.4–10.8)

## 2019-08-05 LAB — PROTIME-INR
INR: 1.1 (ref 0.9–1.2)
Prothrombin Time: 11.5 s (ref 9.1–12.0)

## 2019-08-05 MED FILL — GABAPENTIN 300 MG CAPSULE: 300 | 30 days supply | Qty: 30 | Fill #0

## 2019-08-05 NOTE — Telephone Encounter (Signed)
Pt scheduled for next week , Rita Watkins will go down and check INR due to patient being an amptee

## 2019-08-05 NOTE — Telephone Encounter (Signed)
Pt needs nurse visit inr check this coming Tuesday.

## 2019-08-05 NOTE — Telephone Encounter (Signed)
Ok. Thanks!

## 2019-08-06 LAB — LIPID PANEL
Chol/HDL Ratio: 7.3 ratio — ABNORMAL HIGH (ref 0.0–4.4)
Cholesterol, Total: 247 mg/dL — ABNORMAL HIGH (ref 100–199)
HDL: 34 mg/dL — ABNORMAL LOW (ref >39)
LDL Chol Calc (NIH): 176 mg/dL — ABNORMAL HIGH (ref 0–99)
Triglycerides: 195 mg/dL — ABNORMAL HIGH (ref 0–149)
VLDL Cholesterol Cal: 37 mg/dL (ref 5–40)

## 2019-08-06 LAB — BRAIN NATRIURETIC PEPTIDE: BNP: 87.3 pg/mL (ref 0.0–100.0)

## 2019-08-07 ENCOUNTER — Telehealth: Payer: Self-pay | Admitting: Medical

## 2019-08-07 DIAGNOSIS — E1169 Type 2 diabetes mellitus with other specified complication: Secondary | ICD-10-CM

## 2019-08-07 NOTE — Telephone Encounter (Signed)
Referal to cardiologist placed.

## 2019-08-12 ENCOUNTER — Ambulatory Visit (INDEPENDENT_AMBULATORY_CARE_PROVIDER_SITE_OTHER): Payer: Medicare Other

## 2019-08-12 ENCOUNTER — Other Ambulatory Visit: Payer: Self-pay

## 2019-08-12 DIAGNOSIS — Z7901 Long term (current) use of anticoagulants: Secondary | ICD-10-CM | POA: Diagnosis not present

## 2019-08-12 LAB — POCT INR: INR: 2.6 (ref 2.0–3.0)

## 2019-08-12 NOTE — Progress Notes (Addendum)
Pt here for INR check per  Goal INR = 2.0-3.0  Last INR =1.1  Pt currently takes Coumadin 7.5 mg, Tuesday, Wednesday, Thursday, Friday and Saturday. She takes 5 mg Sundays and Mondays.   Pt denies recent antibiotics, no dietary changes and no unusual bruising / bleeding.  INR today = 2.6  Please advise on further instructions when to follow up.  Routed to DOD in absence of PCP.   Reviewed. Rec recheck in 4 weeks.  Jilda Roche Wendling 11:55 AM 08/12/19

## 2019-08-15 ENCOUNTER — Encounter: Payer: Self-pay | Admitting: General Practice

## 2019-09-03 MED FILL — DULoxetine HCL 30 MG CPEP: 30 | 30 days supply | Qty: 30 | Fill #1

## 2019-09-03 MED FILL — GABAPENTIN 300 MG CAPSULE: 300 | 30 days supply | Qty: 30 | Fill #1

## 2019-09-03 MED FILL — ALPRAZolam 0.5 MG TABS: 0.5 | 30 days supply | Qty: 60 | Fill #2

## 2019-09-03 MED FILL — BENAZEPRIL HCL 20 MG TABLET: 20 | 30 days supply | Qty: 60 | Fill #1

## 2019-10-01 MED FILL — BENAZEPRIL HCL 20 MG TABLET: 20 | 30 days supply | Qty: 60 | Fill #2

## 2019-10-01 MED FILL — DULoxetine HCL 30 MG CPEP: 30 | 30 days supply | Qty: 30 | Fill #2

## 2019-10-01 MED FILL — WARFARIN SODIUM 5 MG TABLET: 5 | 45 days supply | Qty: 60 | Fill #1

## 2019-10-01 MED FILL — GABAPENTIN 300 MG CAPSULE: 300 | 30 days supply | Qty: 30 | Fill #2

## 2019-10-01 MED FILL — ALPRAZolam 0.5 MG TABS: 0.5 | 30 days supply | Qty: 60 | Fill #0

## 2019-11-04 MED FILL — DULoxetine HCL 30 MG CPEP: 30 | 30 days supply | Qty: 30 | Fill #3

## 2019-11-04 MED FILL — GABAPENTIN 300 MG CAPSULE: 300 | 30 days supply | Qty: 30 | Fill #3

## 2019-11-04 MED FILL — BENAZEPRIL HCL 20 MG TABLET: 20 | 30 days supply | Qty: 60 | Fill #3

## 2019-12-01 MED FILL — GABAPENTIN 300 MG CAPSULE: 300 | 30 days supply | Qty: 30 | Fill #4

## 2019-12-01 MED FILL — BENAZEPRIL HCL 20 MG TABLET: 20 | 30 days supply | Qty: 60 | Fill #4

## 2019-12-01 MED FILL — DULoxetine HCL 30 MG CPEP: 30 | 30 days supply | Qty: 30 | Fill #4

## 2019-12-01 MED FILL — WARFARIN SODIUM 5 MG TABLET: 5 | 45 days supply | Qty: 60 | Fill #2

## 2019-12-01 MED FILL — AMLODIPINE BESYLATE 5 MG TA: 5 | 90 days supply | Qty: 90 | Fill #1

## 2019-12-15 MED FILL — ALPRAZolam 0.5 MG TABS: 0.5 | 30 days supply | Qty: 60 | Fill #1

## 2019-12-31 MED FILL — GABAPENTIN 300 MG CAPSULE: 300 | 30 days supply | Qty: 30 | Fill #5

## 2019-12-31 MED FILL — BENAZEPRIL HCL 20 MG TABLET: 20 | 30 days supply | Qty: 60 | Fill #5

## 2019-12-31 MED FILL — DULoxetine HCL 30 MG CPEP: 30 | 30 days supply | Qty: 30 | Fill #5

## 2020-01-14 MED FILL — ALPRAZolam 0.5 MG TABS: 0.5 | 30 days supply | Qty: 60 | Fill #2

## 2020-02-02 ENCOUNTER — Other Ambulatory Visit: Payer: Self-pay | Admitting: Medical

## 2020-02-02 DIAGNOSIS — I10 Essential (primary) hypertension: Secondary | ICD-10-CM

## 2020-02-02 DIAGNOSIS — G629 Polyneuropathy, unspecified: Secondary | ICD-10-CM

## 2020-02-02 DIAGNOSIS — F411 Generalized anxiety disorder: Secondary | ICD-10-CM

## 2020-02-02 MED FILL — BENAZEPRIL HCL 20 MG TABLET: 20 | 30 days supply | Qty: 60 | Fill #0

## 2020-02-02 MED FILL — GABAPENTIN 300 MG CAPSULE: 300 | 30 days supply | Qty: 30 | Fill #0

## 2020-02-02 MED FILL — WARFARIN SODIUM 5 MG TABLET: 5 | 45 days supply | Qty: 60 | Fill #3

## 2020-02-02 MED FILL — DULoxetine HCL 30 MG CPEP: 30 | 30 days supply | Qty: 30 | Fill #0

## 2020-02-02 MED FILL — AMLODIPINE BESYLATE 5 MG TA: 5 | 30 days supply | Qty: 30 | Fill #0

## 2020-03-03 ENCOUNTER — Ambulatory Visit (INDEPENDENT_AMBULATORY_CARE_PROVIDER_SITE_OTHER): Payer: Medicare Other | Admitting: Medical

## 2020-03-03 ENCOUNTER — Telehealth: Payer: Self-pay | Admitting: Medical

## 2020-03-03 ENCOUNTER — Other Ambulatory Visit: Payer: Self-pay

## 2020-03-03 ENCOUNTER — Other Ambulatory Visit: Payer: Self-pay | Admitting: Medical

## 2020-03-03 ENCOUNTER — Encounter: Payer: Self-pay | Admitting: Medical

## 2020-03-03 VITALS — BP 130/65 | HR 58 | Resp 18 | Ht 69.0 in | Wt 245.0 lb

## 2020-03-03 DIAGNOSIS — R739 Hyperglycemia, unspecified: Secondary | ICD-10-CM

## 2020-03-03 DIAGNOSIS — Z86718 Personal history of other venous thrombosis and embolism: Secondary | ICD-10-CM | POA: Diagnosis not present

## 2020-03-03 DIAGNOSIS — E785 Hyperlipidemia, unspecified: Secondary | ICD-10-CM

## 2020-03-03 DIAGNOSIS — G629 Polyneuropathy, unspecified: Secondary | ICD-10-CM

## 2020-03-03 DIAGNOSIS — E1169 Type 2 diabetes mellitus with other specified complication: Secondary | ICD-10-CM | POA: Diagnosis not present

## 2020-03-03 DIAGNOSIS — F411 Generalized anxiety disorder: Secondary | ICD-10-CM

## 2020-03-03 DIAGNOSIS — I10 Essential (primary) hypertension: Secondary | ICD-10-CM

## 2020-03-03 DIAGNOSIS — Z7901 Long term (current) use of anticoagulants: Secondary | ICD-10-CM

## 2020-03-03 DIAGNOSIS — Z79899 Other long term (current) drug therapy: Secondary | ICD-10-CM

## 2020-03-03 LAB — COMPREHENSIVE METABOLIC PANEL
ALT: 11 U/L (ref 0–35)
AST: 15 U/L (ref 0–37)
Albumin: 3.8 g/dL (ref 3.5–5.2)
Alkaline Phosphatase: 84 U/L (ref 39–117)
BUN: 29 mg/dL — ABNORMAL HIGH (ref 6–23)
CO2: 29 mEq/L (ref 19–32)
Calcium: 9 mg/dL (ref 8.4–10.5)
Chloride: 104 mEq/L (ref 96–112)
Creatinine, Ser: 1.14 mg/dL (ref 0.40–1.20)
GFR: 50.21 mL/min — ABNORMAL LOW (ref >60.00)
Glucose, Bld: 82 mg/dL (ref 70–99)
Potassium: 5.1 mEq/L (ref 3.5–5.1)
Sodium: 138 mEq/L (ref 135–145)
Total Bilirubin: 0.4 mg/dL (ref 0.2–1.2)
Total Protein: 7.3 g/dL (ref 6.0–8.3)

## 2020-03-03 LAB — CBC WITH DIFFERENTIAL/PLATELET
Basophils Absolute: 0 10*3/uL (ref 0.0–0.1)
Basophils Relative: 0.8 % (ref 0.0–3.0)
Eosinophils Absolute: 0.2 10*3/uL (ref 0.0–0.7)
Eosinophils Relative: 4 % (ref 0.0–5.0)
HCT: 35.7 % — ABNORMAL LOW (ref 36.0–46.0)
Hemoglobin: 11.6 g/dL — ABNORMAL LOW (ref 12.0–15.0)
Lymphocytes Relative: 26.6 % (ref 12.0–46.0)
Lymphs Abs: 1.5 10*3/uL (ref 0.7–4.0)
MCHC: 32.5 g/dL (ref 30.0–36.0)
MCV: 90 fl (ref 78.0–100.0)
Monocytes Absolute: 0.7 10*3/uL (ref 0.1–1.0)
Monocytes Relative: 11.8 % (ref 3.0–12.0)
Neutro Abs: 3.3 10*3/uL (ref 1.4–7.7)
Neutrophils Relative %: 56.8 % (ref 43.0–77.0)
Platelets: 256 10*3/uL (ref 150.0–400.0)
RBC: 3.97 Mil/uL (ref 3.87–5.11)
RDW: 13.3 % (ref 11.5–15.5)
WBC: 5.8 10*3/uL (ref 4.0–10.5)

## 2020-03-03 LAB — LIPID PANEL
Cholesterol: 265 mg/dL — ABNORMAL HIGH (ref 0–200)
HDL: 35.3 mg/dL — ABNORMAL LOW (ref >39.00)
NonHDL: 229.55
Total CHOL/HDL Ratio: 8
Triglycerides: 254 mg/dL — ABNORMAL HIGH (ref 0.0–149.0)
VLDL: 50.8 mg/dL — ABNORMAL HIGH (ref 0.0–40.0)

## 2020-03-03 LAB — HEMOGLOBIN A1C: Hgb A1c MFr Bld: 5.5 % (ref 4.6–6.5)

## 2020-03-03 LAB — LDL CHOLESTEROL, DIRECT: Direct LDL: 165 mg/dL

## 2020-03-03 LAB — PROTIME-INR
INR: 2.5 ratio — ABNORMAL HIGH (ref 0.8–1.0)
Prothrombin Time: 28.1 s — ABNORMAL HIGH (ref 9.6–13.1)

## 2020-03-03 MED ORDER — ALPRAZOLAM 0.5 MG PO TABS: 0.5000 mg | ORAL_TABLET | Freq: Two times a day (BID) | ORAL | 3 refills | Status: DC | PRN

## 2020-03-03 MED ORDER — BENAZEPRIL HCL 20 MG PO TABS: 20.0000 mg | ORAL_TABLET | Freq: Two times a day (BID) | ORAL | 0 refills | Status: DC

## 2020-03-03 MED ORDER — WARFARIN SODIUM 5 MG PO TABS: ORAL_TABLET | ORAL | 5 refills | Status: DC

## 2020-03-03 MED ORDER — GABAPENTIN 300 MG PO CAPS: 300.0000 mg | ORAL_CAPSULE | Freq: Every day | ORAL | 0 refills | Status: DC

## 2020-03-03 MED ORDER — AMLODIPINE BESYLATE 5 MG PO TABS: 5.0000 mg | ORAL_TABLET | Freq: Every day | ORAL | 0 refills | Status: DC

## 2020-03-03 MED FILL — ALPRAZolam 0.5 MG TABS: 0.5 | 30 days supply | Qty: 60 | Fill #0

## 2020-03-03 MED FILL — AMLODIPINE BESYLATE 5 MG TA: 5 | 30 days supply | Qty: 30 | Fill #0

## 2020-03-03 MED FILL — WARFARIN SODIUM 5 MG TABLET: 5 | 30 days supply | Qty: 30 | Fill #0

## 2020-03-03 MED FILL — GABAPENTIN 300 MG CAPSULE: 300 | 30 days supply | Qty: 30 | Fill #0

## 2020-03-03 MED FILL — BENAZEPRIL HCL 20 MG TABLET: 20 | 30 days supply | Qty: 60 | Fill #0

## 2020-03-03 NOTE — Progress Notes (Signed)
Subjective:    Patient ID: Rita Watkins, female    DOB: [birth year], 65 y.o.   MRN: [id number]  HPI  Pt in for follow up.  Pt had covid booster 11-18-19.  Pt bp readings at home 130/65. Last check was yesterday.  She has high cholesterol. Pt is fasting. Allergic reaction to statin. I had put in referral to cardiologist to discuss options. Sent note to that effect on my chart. [phone]    The 10-year ASCVD risk score Denman George DC Montez Hageman., et al., 2013) is: 34.3%   Values used to calculate the score:     Age: 25 years     Sex: Female     Is Non-Hispanic African American: No     Diabetic: Yes     Tobacco smoker: Yes     Systolic Blood Pressure: 130 mmHg     Is BP treated: Yes     HDL Cholesterol: 34 mg/dL     Total Cholesterol: 247 mg/dL   Hx of dvt. Pt on coumadin 5 mg daily. Pt has not had inr checked in a while.  Pt sugar elevated in the past. Will get a1c today.   Review of Systems  Constitutional: Negative for chills, fatigue and fever.  HENT: Negative for congestion and drooling.   Respiratory: Negative for cough, chest tightness, shortness of breath and wheezing.   Cardiovascular: Negative for chest pain and palpitations.  Gastrointestinal: Negative for abdominal pain, blood in stool, nausea and vomiting.  Musculoskeletal: Negative for back pain and gait problem.  Skin: Negative for rash.  Neurological: Negative for dizziness, seizures, weakness, light-headedness and headaches.  Hematological: Negative for adenopathy. Does not bruise/bleed easily.  Psychiatric/Behavioral: Negative for behavioral problems, confusion and suicidal ideas. The patient is not nervous/anxious.     Past Medical History:  Diagnosis Date  . Allergy   . Anxiety   . Asthma   . COPD (chronic obstructive pulmonary disease) (HCC)   . Depression   . Dizziness 07/19/2016  . Hyperlipidemia   . Hypertension   . Lymphangitis 06/07/2016  . 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 Nausea  07/19/2016  . Necrosis (HCC) 07/19/2016     Social History   Socioeconomic History  . Marital status: Widowed    Spouse name: Not on file  . Number of children: Not on file  . Years of education: high schoo  . Highest education level: Not on file  Occupational History  . Occupation: ADM. ASSISTANT    Employer: [employer]  Tobacco Use  . Smoking status: Former Smoker    Packs/day: 1.00    Years: 35.00    Pack years: 35.00    Types: Cigarettes    Quit date: 09/14/2016    Years since quitting: 3.4  . Smokeless tobacco: Never Used  Substance and Sexual Activity  . Alcohol use: No    Alcohol/week: 0.0 standard drinks  . Drug use: No  . Sexual activity: Never  Other Topics Concern  . Not on file  Social History Narrative   Does not exercise.   Social Determinants of Health   Financial Resource Strain: Not on file  Food Insecurity: Not on file  Transportation Needs: Not on file  Physical Activity: Not on file  Stress: Not on file  Social Connections: Not on file  Intimate Partner Violence: Not on file    Past Surgical History:  Procedure Laterality Date  . FOOT SURGERY     Left  . HIP PINNING  3 each hip  . IRRIGATION AND DEBRIDEMENT ABSCESS N/A 06/27/2016   Procedure: IRRIGATION AND DEBRIDEMENT LEFT LEG WITH SOTFT TISSUE BIOPSY;  Surgeon: Berna Bue, MD;  Location: [facility];  Service: General;  Laterality: N/A;  . LYMPH NODE BIOPSY Left 06/09/2016   Procedure: BIOPSY LEFT LEG;  Surgeon: Peggye Form, DO;  Location: [facility];  Service: Plastics;  Laterality: Left;  . SLIPPED CAPITAL FEMORAL EPIPHYSIS PINNING      Family History  Problem Relation Age of Onset  . Hypertension Mother   . Hyperlipidemia Mother   . Hypertension Brother   . Asthma Brother     Allergies  Allergen Reactions  . Avelox [Moxifloxacin Hcl In Nacl] Anaphylaxis  . Terbinafine Hcl Swelling  . Zocor [Simvastatin - High Dose] Anaphylaxis  . Montelukast Sodium Other (See Comments)     Insomnia  . Pravastatin Other (See Comments)    Myalgias on 20mg     Current Outpatient Medications on File Prior to Visit  Medication Sig Dispense Refill  . ALPRAZolam (XANAX) 0.5 MG tablet Take 1 tablet (0.5 mg total) by mouth 2 (two) times daily as needed for anxiety. 60 tablet 3  . amLODipine (NORVASC) 5 MG tablet Take 1 tablet (5 mg total) by mouth daily. 30 tablet 0  . benazepril (LOTENSIN) 20 MG tablet Take 1 tablet (20 mg total) by mouth 2 (two) times daily. 60 tablet 0  . DULoxetine (CYMBALTA) 30 MG capsule Take 1 capsule (30 mg total) by mouth daily. 30 capsule 0  . gabapentin (NEURONTIN) 300 MG capsule Take 1 capsule (300 mg total) by mouth at bedtime. 30 capsule 0  . warfarin (COUMADIN) 5 MG tablet 7.5 mg Tuesday wed, Thursday and Friday. 5 mg sat, sunday and monday 60 tablet 3  . ALPRAZolam (XANAX) 0.5 MG tablet TAKE 1 TABLET (0.5 MG TOTAL) BY MOUTH 2 (TWO) TIMES DAILY AS NEEDED FOR ANXIETY 60 tablet 2  . warfarin (COUMADIN) 5 MG tablet 5 mg daily 30 tablet 3   No current facility-administered medications on file prior to visit.    BP 130/65 Comment: yesterday at home. 4 pm.  Pulse (!) 58   Resp 18   Ht 5\' 9"  (1.753 m)   Wt 245 lb (111.1 kg)   SpO2 100%   BMI 36.18 kg/m      Objective:   Physical Exam  General Mental Status- Alert. General Appearance- Not in acute distress.   Skin General: Color- Normal Color. Moisture- Normal Moisture.  Neck Carotid Arteries- Normal color. Moisture- Normal Moisture. No carotid bruits. No JVD.  Chest and Lung Exam Auscultation: Breath Sounds:-Normal.  Cardiovascular Auscultation:Rythm- Regular. Murmurs & Other Heart Sounds:Auscultation of the heart reveals- No Murmurs.  Abdomen Inspection:-Inspeection Normal. Palpation/Percussion:Note:No mass. Palpation and Percussion of the abdomen reveal- Non Tender, Non Distended + BS, no rebound or guarding.   Neurologic Cranial Nerve exam:- CN III-XII intact(No nystagmus),  symmetric smile. Strength:- 5/5 equal and symmetric strength both upper and lower extremities.      Assessment & Plan:  History of hypertension and blood pressure control at home.  BP reading was 130/65.  Continue amlodipine 5 mg daily and Lotensin 20 mg daily.  Elevated sugar in the past.  Will get A1c lab today.  History of hyperlipidemia and high 10-year cardiovascular risk score.  Place referral to cardiologist again since you do have allergies to statins.  History of a DVT.  We will repeat INR today.  Continue Coumadin dosing to be determined after  INR review.  History of anxiety.  Updated controlled med contract today and getting drug screen.  History of neuropathy post amputation.  Continue gabapentin.  Follow-up 3 to 6 months sooner if needed.    Also want you to do monthly INR checks.

## 2020-03-03 NOTE — Addendum Note (Signed)
Addended by: Thelma Barge D on: 03/03/2020 12:08 PM   Modules accepted: Orders

## 2020-03-03 NOTE — Telephone Encounter (Signed)
Opened review. 

## 2020-03-03 NOTE — Addendum Note (Signed)
Addended by: Thelma Barge D on: 03/03/2020 12:47 PM   Modules accepted: Orders

## 2020-03-03 NOTE — Patient Instructions (Addendum)
History of hypertension and blood pressure control at home.  BP reading was 130/65.  Continue amlodipine 5 mg daily and Lotensin 20 mg daily.  Elevated sugar in the past.  Will get A1c lab today.  History of hyperlipidemia and high 10-year cardiovascular risk score.  Place referral to cardiologist again since you do have allergies to statins.  History of a DVT.  We will repeat INR today.  Continue Coumadin dosing to be determined after INR review.  History of anxiety.  Updated controlled med contract today and getting drug screen.  History of neuropathy post amputation.  Continue gabapentin.  Follow-up 3 to 6 months sooner if needed.    Also want you to do monthly INR checks.

## 2020-03-11 NOTE — Telephone Encounter (Signed)
Left message letting patient know her appointment was canceled. Also let patient know to call anytime to reschedule her appointment and that she will receive a MyChart message about it.

## 2020-03-16 ENCOUNTER — Ambulatory Visit: Payer: Medicare Other | Admitting: Cardiology

## 2020-03-23 ENCOUNTER — Ambulatory Visit: Payer: Medicare Other | Admitting: Cardiology

## 2020-03-31 ENCOUNTER — Other Ambulatory Visit: Payer: Self-pay | Admitting: Medical

## 2020-03-31 DIAGNOSIS — I10 Essential (primary) hypertension: Secondary | ICD-10-CM

## 2020-03-31 DIAGNOSIS — G629 Polyneuropathy, unspecified: Secondary | ICD-10-CM

## 2020-04-01 ENCOUNTER — Other Ambulatory Visit: Payer: Self-pay | Admitting: Medical

## 2020-04-01 DIAGNOSIS — Z7901 Long term (current) use of anticoagulants: Secondary | ICD-10-CM

## 2020-04-01 MED FILL — ALPRAZOLAM 0.5 MG TABS: 0.5 | 30 days supply | Qty: 60 | Fill #1

## 2020-04-01 MED FILL — WARFARIN SODIUM 5 MG TABLET: 5 | 30 days supply | Qty: 30 | Fill #1

## 2020-04-01 MED FILL — BENAZEPRIL HCL 20 MG TABLET: 20 | 30 days supply | Qty: 60 | Fill #0

## 2020-04-01 MED FILL — AMLODIPINE BESYLATE 5 MG TA: 5 | 30 days supply | Qty: 30 | Fill #0

## 2020-04-01 MED FILL — GABAPENTIN 300 MG CAPSULE: 300 | 30 days supply | Qty: 30 | Fill #0

## 2020-04-01 NOTE — Telephone Encounter (Signed)
Requesting: tramadol  Contract:03/10/2020 UDS:03/03/20 Last Visit:03/03/20 Next Visit:n/a Last Refill:03/03/20  Please Advise

## 2020-04-03 ENCOUNTER — Other Ambulatory Visit: Payer: Self-pay | Admitting: Medical

## 2020-04-03 ENCOUNTER — Telehealth: Payer: Self-pay | Admitting: Medical

## 2020-04-03 MED ORDER — WARFARIN SODIUM 5 MG PO TABS
ORAL_TABLET | ORAL | 5 refills | Status: DC
Start: 2020-04-03 — End: 2020-04-20

## 2020-04-03 NOTE — Telephone Encounter (Signed)
You sent me request for tramadol on this pt. I don't see that she has been on this?  Also sent xanax refill request. She is not due for refill. Has refills.  Somehow reviewed your request and then lost in epic? Can you find request note you sent me?

## 2020-04-05 NOTE — Telephone Encounter (Signed)
Original note routed to provider

## 2020-04-05 NOTE — Telephone Encounter (Signed)
She does not have contract for tramadol. If so will you show me.   If not on contract  then needs office visit or virtual video visit. Prefer not to do telephone call.

## 2020-04-06 NOTE — Telephone Encounter (Signed)
That was a typo on my behalf , I meant * Xanax , not Tramadol

## 2020-04-06 NOTE — Telephone Encounter (Signed)
She does have a contract for Xanax which she was requesting

## 2020-04-06 NOTE — Telephone Encounter (Signed)
Per Phil/pharmacist at pharmacy [facility] she got xanax on 04-01-20 and has one refill. So ask pt about this. Why request for xanax?

## 2020-04-06 NOTE — Telephone Encounter (Signed)
Patient did duplicate request by mistake like autofill

## 2020-04-19 ENCOUNTER — Other Ambulatory Visit: Payer: Self-pay

## 2020-04-19 DIAGNOSIS — I10 Essential (primary) hypertension: Secondary | ICD-10-CM | POA: Insufficient documentation

## 2020-04-19 DIAGNOSIS — T7840XA Allergy, unspecified, initial encounter: Secondary | ICD-10-CM | POA: Insufficient documentation

## 2020-04-19 DIAGNOSIS — F32A Depression, unspecified: Secondary | ICD-10-CM | POA: Insufficient documentation

## 2020-04-19 DIAGNOSIS — F419 Anxiety disorder, unspecified: Secondary | ICD-10-CM | POA: Insufficient documentation

## 2020-04-19 DIAGNOSIS — J449 Chronic obstructive pulmonary disease, unspecified: Secondary | ICD-10-CM | POA: Insufficient documentation

## 2020-04-19 DIAGNOSIS — E785 Hyperlipidemia, unspecified: Secondary | ICD-10-CM | POA: Insufficient documentation

## 2020-04-20 ENCOUNTER — Encounter: Payer: Self-pay | Admitting: Cardiology

## 2020-04-20 ENCOUNTER — Other Ambulatory Visit: Payer: Self-pay

## 2020-04-20 ENCOUNTER — Ambulatory Visit (INDEPENDENT_AMBULATORY_CARE_PROVIDER_SITE_OTHER): Payer: Medicare Other | Admitting: Cardiology

## 2020-04-20 DIAGNOSIS — R011 Cardiac murmur, unspecified: Secondary | ICD-10-CM | POA: Diagnosis not present

## 2020-04-20 DIAGNOSIS — E66811 Obesity, class 1: Secondary | ICD-10-CM

## 2020-04-20 DIAGNOSIS — Z86711 Personal history of pulmonary embolism: Secondary | ICD-10-CM | POA: Insufficient documentation

## 2020-04-20 DIAGNOSIS — E669 Obesity, unspecified: Secondary | ICD-10-CM | POA: Insufficient documentation

## 2020-04-20 DIAGNOSIS — I10 Essential (primary) hypertension: Secondary | ICD-10-CM

## 2020-04-20 HISTORY — DX: Obesity, class 1: E66.811

## 2020-04-20 HISTORY — DX: Personal history of pulmonary embolism: Z86.711

## 2020-04-20 HISTORY — DX: Obesity, unspecified: E66.9

## 2020-04-20 HISTORY — DX: Essential (primary) hypertension: I10

## 2020-04-20 HISTORY — DX: Cardiac murmur, unspecified: R01.1

## 2020-04-20 NOTE — Progress Notes (Signed)
Cardiology Office Note:    Date:  04/20/2020   ID:  Rita Watkins, DOB 12/31/1955, MRN [id number]  PCP:  Mackie Pai, PA-C  Cardiologist:  Jenean Lindau, MD   Referring MD: Mackie Pai, PA-C    ASSESSMENT:    1. Cardiac murmur   2. Obesity (BMI 30.0-34.9)   3. History of pulmonary embolism   4. Essential hypertension    PLAN:    In order of problems listed above:  1. Primary prevention stressed with the patient.  Importance of compliance with diet medication stressed and she vocalized understanding. 2. Mixed dyslipidemia: Diet was emphasized.  Lipids were reviewed.  Will have a Chem-7 and liver panel today.  This is at baseline.  I do not intend to start her on a low-dose statin because of multiple risk factors.  I want to document here that she is an ex-smoker but quit 2 years ago after the amputation.  I told her never to go back to smoking and she promises. 3. Essential hypertension: Blood pressure stable and diet was emphasized.  Lifestyle modification and salt intake issues were discussed. 4. Morbid obesity: Diet was emphasized.  She plans to do better with obesity and lose weight.  I expressed to her the importance of this and she promises to do better. 5. Patient will be seen in follow-up appointment in 6 months or earlier if the patient has any concerns.  Patient and family member had multiple questions which were answered to their satisfaction.    Medication Adjustments/Labs and Tests Ordered: Current medicines are reviewed at length with the patient today.  Concerns regarding medicines are outlined above.  No orders of the defined types were placed in this encounter.  No orders of the defined types were placed in this encounter.    History of Present Illness:    Rita Watkins is a 65 y.o. female who is being seen today for the evaluation of cardiac murmur and dyslipidemia at the request of [place], Percell Miller, [place].  Patient is a pleasant 65 year old female.   She has past medical history of essential hypertension, morbid obesity.  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  Patient leads a sedentary lifestyle.  Patient is sent here for evaluation for dyslipidemia.  She leads a sedentary lifestyle.  She does not do much.  No chest pain orthopnea or PND.  She is accompanied by her friend.  At the time of my evaluation, the patient is alert awake oriented and in no distress.   Past Medical History:  Diagnosis Date  . Allergy   . Anemia associated with acute blood loss 08/22/2016   Formatting of this note is different from the original.  CT abdomen 7/17:  1.  Large left retroperitoneal hematoma with mixed density blood products.  Assessment for active bleeding is not possible without IV contrast. 2.  Contrast in the renal collecting systems presumably from contrast enhanced studies performed on August 20, 2016 consistent with known impaired renal function.   CT abdomen 7/27:   . Anxiety   . Asthma   . Benign essential HTN 08/14/2016  . Cardiac murmur 04/20/2020  . Cigarette smoker 07/15/2014  . COPD (chronic obstructive pulmonary disease) ([place])   . Deep vein thrombosis (DVT) of popliteal vein of both lower extremities ([place]) 08/18/2016   Last Assessment & Plan:  Formatting of this note might be different from the original. Not clear if provoked/unprovoked (pt had trauma to  left toe back in 03/2016 with some decreased mobility thereafter) CT chest confirmed acute PE and also concerning for some bilateral upper lobe groundglass opacities Hematology on board and heparin drip started but  discontinued 7/17  2/2 large retroperitoneal he  . Depression   . Depression with anxiety 09/13/2012  . Dizziness 07/19/2016  . Hyperlipidemia   . Hypertension   . Leukocytosis 07/30/2014  . Lymphangitis 06/07/2016  . Mycobacterial infection 06/07/2016  . 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 Nausea 07/19/2016  . Necrosis ([place])  07/19/2016  . 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 Per dermatology: Please provide the patient with our outpatient clinic phone number [phone]) to call and schedule a follow up appointment for continued management.   Last Assessment & Plan:  Formatting of this note might be different from the original. Uncertain etiology Vasculitic vs infectious vs malignancy Extensive work up has  . Obesity 09/20/2016   Last Assessment & Plan:  Formatting of this note might be different from the original. Body mass index is 22.67 kg/m.  - For weight loss counseling.  Marland Kitchen Open leg wound, left, sequela 08/17/2016   Formatting of this note might be different from the original. - Chronic left wound starting in April 2018 and worsening despite antibiotics and debridement -Patient had multiple hospitalizations and was treated with steroids and antibiotics with worsening/extension of the wound - ESR 103, CRP 168 - ANA, factor V Leyden, Antithrombin III, Cardiolite and antibody IgG, C3 and C4 complement proteinase  . Peripheral vascular disease ([place]) 08/19/2016   Last Assessment & Plan:  Formatting of this note might be different from the original. 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  ASA 81 mg daily PT consult  . Pulmonary embolism without acute cor pulmonale ([facility]) 08/18/2016   Last Assessment & Plan:  Formatting of this note might be different from the original. Uncertain etiology  Was on heparin gtt but discontinued 2/2 large retroperitoneal hematoma (now stable on follow up CT) O2 PRN IVC placed  Vascular following No SOB, SpO2 93% on room air On warfarin, on hold due to supratherapeutic INR, will restart at 76m  once able  . 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 Surgical wound, non healing 08/16/2016   Formatting of this note might be different from the original. Added automatically from request for surgery [id number]  Last Assessment & Plan:  Formatting of this note might be different from the original. Presented with large area of necrotic tissue LLE Bilateral popliteal DVT Etiology uncertain:  Ischemic vs vasculitis vs autoimmune vs possible occult malignancy Bx: leukocytoblastic vasculopathy  In  . Tobacco use 08/18/2016   Last Assessment & Plan:  Formatting of this note might be different from the original. Patient has a 30+ pack year history of smoking Discussed low dose CT chest for cancer screening at admission, patient open to this but concerned about extra cost as she has no insurance at this time. No obvious suspicious lesions noted on CTA PE study.  Counseled regarding tobacco cessation    Past Surgical History:  Procedure Laterality Date  . FOOT SURGERY     Left  . HIP PINNING     3 each hip  . IRRIGATION AND DEBRIDEMENT ABSCESS  N/A 06/27/2016   Procedure: IRRIGATION AND DEBRIDEMENT LEFT LEG WITH SOTFT TISSUE BIOPSY;  Surgeon: Clovis Riley, MD;  Location: [facility];  Service: General;  Laterality: N/A;  . LYMPH NODE BIOPSY Left 06/09/2016   Procedure: BIOPSY LEFT LEG;  Surgeon: Wallace Going, DO;  Location: [facility];  Service: Plastics;  Laterality: Left;  . SLIPPED CAPITAL FEMORAL EPIPHYSIS PINNING      Current Medications: Current Meds  Medication Sig  . ALPRAZolam (XANAX) 0.5 MG tablet Take 1 tablet (0.5 mg total) by mouth 2 (two) times daily as needed for anxiety.  Marland Kitchen amLODipine (NORVASC) 5 MG tablet Take 5 mg by mouth daily.  . benazepril (LOTENSIN) 20 MG tablet Take 20 mg by mouth 2 (two) times daily.  Marland Kitchen gabapentin (NEURONTIN) 300 MG capsule Take 300 mg by mouth at bedtime.  Marland Kitchen warfarin (COUMADIN) 5 MG tablet Take 5 mg by mouth daily.     Allergies:   Avelox [moxifloxacin hcl in  nacl], Terbinafine hcl, Zocor [simvastatin - high dose], Montelukast sodium, and Pravastatin   Social History   Socioeconomic History  . Marital status: Widowed    Spouse name: Not on file  . Number of children: Not on file  . Years of education: high schoo  . Highest education level: Not on file  Occupational History  . Occupation: ADM. ASSISTANT    Employer: [employer]  Tobacco Use  . Smoking status: Former Smoker    Packs/day: 1.00    Years: 35.00    Pack years: 35.00    Types: Cigarettes    Quit date: 09/14/2016    Years since quitting: 3.6  . Smokeless tobacco: Never Used  Substance and Sexual Activity  . Alcohol use: No    Alcohol/week: 0.0 standard drinks  . Drug use: No  . Sexual activity: Never  Other Topics Concern  . Not on file  Social History Narrative   Does not exercise.   Social Determinants of Health   Financial Resource Strain: Not on file  Food Insecurity: Not on file  Transportation Needs: Not on file  Physical Activity: Not on file  Stress: Not on file  Social Connections: Not on file     Family History: The patient's family history includes Asthma in her brother; Hyperlipidemia in her mother; Hypertension in her brother and mother.  ROS:   Please see the history of present illness.    All other systems reviewed and are negative.  EKGs/Labs/Other Studies Reviewed:    The following studies were reviewed today: EKG reveals sinus rhythm and nonspecific ST-T changes   Recent Labs: 08/05/2019: BNP 87.3 03/03/2020: ALT 11; BUN 29; Creatinine, Ser 1.14; Hemoglobin 11.6; Platelets 256.0; Potassium 5.1; Sodium 138  Recent Lipid Panel    Component Value Date/Time   CHOL 265 (H) 03/03/2020 1141   CHOL 247 (H) 08/05/2019 0000   TRIG 254.0 (H) 03/03/2020 1141   HDL 35.30 (L) 03/03/2020 1141   HDL 34 (L) 08/05/2019 0000   CHOLHDL 8 03/03/2020 1141   VLDL 50.8 (H) 03/03/2020 1141   LDLCALC 176 (H) 08/05/2019 0000   LDLDIRECT 165.0 03/03/2020 1141     Physical Exam:    VS:  BP 130/80   Pulse 64   Ht 5' 9"  (1.753 m)   Wt 235 lb (106.6 kg)   SpO2 99%   BMI 34.70 kg/m     Wt Readings from Last 3 Encounters:  04/20/20 235 lb (106.6 kg)  03/03/20 245 lb (111.1 kg)  05/23/19  200 lb (90.7 kg)     GEN: Patient is in no acute distress HEENT: Normal NECK: No JVD; No carotid bruits LYMPHATICS: No lymphadenopathy CARDIAC: S1 S2 regular, 2/6 systolic murmur at the apex. RESPIRATORY:  Clear to auscultation without rales, wheezing or rhonchi  ABDOMEN: Soft, non-tender, non-distended MUSCULOSKELETAL:  No edema; No deformity  SKIN: Warm and dry NEUROLOGIC:  Alert and oriented x 3 PSYCHIATRIC:  Normal affect    Signed, Jenean Lindau, MD  04/20/2020 11:27 AM    [facility]

## 2020-04-20 NOTE — Patient Instructions (Addendum)
Medication Instructions:  No medication changes. *If you need a refill on your cardiac medications before your next appointment, please call your pharmacy*   Lab Work: Your physician recommends that you have labs done in the office today. Your test included  basic metabolic panel and lft.  If you have labs (blood work) drawn today and your tests are completely normal, you will receive your results only by: Marland Kitchen MyChart Message (if you have MyChart) OR . A paper copy in the mail If you have any lab test that is abnormal or we need to change your treatment, we will call you to review the results.   Testing/Procedures: Your physician has requested that you have an echocardiogram. Echocardiography is a painless test that uses sound waves to create images of your heart. It provides your doctor with information about the size and shape of your heart and how well your heart's chambers and valves are working. This procedure takes approximately one hour. There are no restrictions for this procedure.     Follow-Up: At [facility], you and your health needs are our priority.  As part of our continuing mission to provide you with exceptional heart care, we have created designated Provider Care Teams.  These Care Teams include your primary Cardiologist (physician) and Advanced Practice Providers (APPs -  Physician Assistants and Nurse Practitioners) who all work together to provide you with the care you need, when you need it.  We recommend signing up for the patient portal called "MyChart".  Sign up information is provided on this After Visit Summary.  MyChart is used to connect with patients for Virtual Visits (Telemedicine).  Patients are able to view lab/test results, encounter notes, upcoming appointments, etc.  Non-urgent messages can be sent to your provider as well.   To learn more about what you can do with MyChart, go to [email or web address].    Your next appointment:   3 month(s)  The  format for your next appointment:   In Person  Provider:   Belva Crome, MD   Other Instructions  Echocardiogram An echocardiogram is a test that uses sound waves (ultrasound) to produce images of the heart. Images from an echocardiogram can provide important information about:  Heart size and shape.  The size and thickness and movement of your heart's walls.  Heart muscle function and strength.  Heart valve function or if you have stenosis. Stenosis is when the heart valves are too narrow.  If blood is flowing backward through the heart valves (regurgitation).  A tumor or infectious growth around the heart valves.  Areas of heart muscle that are not working well because of poor blood flow or injury from a heart attack.  Aneurysm detection. An aneurysm is a weak or damaged part of an artery wall. The wall bulges out from the normal force of blood pumping through the body. Tell a health care provider about:  Any allergies you have.  All medicines you are taking, including vitamins, herbs, eye drops, creams, and over-the-counter medicines.  Any blood disorders you have.  Any surgeries you have had.  Any medical conditions you have.  Whether you are pregnant or may be pregnant. What are the risks? Generally, this is a safe test. However, problems may occur, including an allergic reaction to dye (contrast) that may be used during the test. What happens before the test? No specific preparation is needed. You may eat and drink normally. What happens during the test?  You will take off your clothes  from the waist up and put on a hospital gown.  Electrodes or electrocardiogram (ECG)patches may be placed on your chest. The electrodes or patches are then connected to a device that monitors your heart rate and rhythm.  You will lie down on a table for an ultrasound exam. A gel will be applied to your chest to help sound waves pass through your skin.  A handheld device,  called a transducer, will be pressed against your chest and moved over your heart. The transducer produces sound waves that travel to your heart and bounce back (or "echo" back) to the transducer. These sound waves will be captured in real-time and changed into images of your heart that can be viewed on a video monitor. The images will be recorded on a computer and reviewed by your health care provider.  You may be asked to change positions or hold your breath for a short time. This makes it easier to get different views or better views of your heart.  In some cases, you may receive contrast through an IV in one of your veins. This can improve the quality of the pictures from your heart. The procedure may vary among health care providers and hospitals.   What can I expect after the test? You may return to your normal, everyday life, including diet, activities, and medicines, unless your health care provider tells you not to do that. Follow these instructions at home:  It is up to you to get the results of your test. Ask your health care provider, or the department that is doing the test, when your results will be ready.  Keep all follow-up visits. This is important. Summary  An echocardiogram is a test that uses sound waves (ultrasound) to produce images of the heart.  Images from an echocardiogram can provide important information about the size and shape of your heart, heart muscle function, heart valve function, and other possible heart problems.  You do not need to do anything to prepare before this test. You may eat and drink normally.  After the echocardiogram is completed, you may return to your normal, everyday life, unless your health care provider tells you not to do that. This information is not intended to replace advice given to you by your health care provider. Make sure you discuss any questions you have with your health care provider. Document Revised: 09/16/2019 Document  Reviewed: 09/16/2019 Elsevier Patient Education  2021 [employer].

## 2020-04-21 ENCOUNTER — Telehealth: Payer: Self-pay

## 2020-04-21 LAB — BASIC METABOLIC PANEL
BUN/Creatinine Ratio: 25 (ref 12–28)
BUN: 30 mg/dL — ABNORMAL HIGH (ref 8–27)
CO2: 20 mmol/L (ref 20–29)
Calcium: 9 mg/dL (ref 8.7–10.3)
Chloride: 103 mmol/L (ref 96–106)
Creatinine, Ser: 1.19 mg/dL — ABNORMAL HIGH (ref 0.57–1.00)
Glucose: 81 mg/dL (ref 65–99)
Potassium: 5.3 mmol/L — ABNORMAL HIGH (ref 3.5–5.2)
Sodium: 139 mmol/L (ref 134–144)
eGFR: 50 mL/min/{1.73_m2} — ABNORMAL LOW (ref >59)

## 2020-04-21 LAB — HEPATIC FUNCTION PANEL
ALT: 14 IU/L (ref 0–32)
AST: 14 IU/L (ref 0–40)
Albumin: 3.9 g/dL (ref 3.8–4.8)
Alkaline Phosphatase: 97 IU/L (ref 44–121)
Bilirubin Total: 0.3 mg/dL (ref 0.0–1.2)
Bilirubin, Direct: <0.1 mg/dL (ref 0.00–0.40)
Total Protein: 7.3 g/dL (ref 6.0–8.5)

## 2020-04-21 NOTE — Telephone Encounter (Signed)
-----   Message from Garwin Brothers, MD sent at 04/21/2020  8:17 AM EDT ----- Keep diet low in K and recheck in one week. Cc pcp  Keep her adequately hydrated. Garwin Brothers, MD 04/21/2020 8:16 AM

## 2020-04-21 NOTE — Telephone Encounter (Signed)
Patient returned call. Informed her Lequita Halt sent her a [employer] response.

## 2020-04-21 NOTE — Telephone Encounter (Signed)
Left message on patients voicemail to please return our call.   

## 2020-04-22 ENCOUNTER — Telehealth: Payer: Self-pay | Admitting: Medical

## 2020-04-22 ENCOUNTER — Telehealth (INDEPENDENT_AMBULATORY_CARE_PROVIDER_SITE_OTHER): Payer: Medicare Other | Admitting: Medical

## 2020-04-22 ENCOUNTER — Other Ambulatory Visit: Payer: Self-pay

## 2020-04-22 DIAGNOSIS — Z86718 Personal history of other venous thrombosis and embolism: Secondary | ICD-10-CM

## 2020-04-22 DIAGNOSIS — E785 Hyperlipidemia, unspecified: Secondary | ICD-10-CM

## 2020-04-22 DIAGNOSIS — R739 Hyperglycemia, unspecified: Secondary | ICD-10-CM

## 2020-04-22 DIAGNOSIS — I10 Essential (primary) hypertension: Secondary | ICD-10-CM | POA: Diagnosis not present

## 2020-04-22 DIAGNOSIS — E875 Hyperkalemia: Secondary | ICD-10-CM

## 2020-04-22 DIAGNOSIS — E1169 Type 2 diabetes mellitus with other specified complication: Secondary | ICD-10-CM

## 2020-04-22 NOTE — Progress Notes (Signed)
Subjective:    Patient ID: Rita Watkins, female    DOB: 20-Nov-1955, 65 y.o.   MRN: [id number]  HPI   Virtual Visit via Video Note  I connected with Rita Watkins on 04/22/20 at 11:20 AM EDT by a video enabled telemedicine application and verified that I am speaking with the correct person using two identifiers.  Location: Patient: home Provider: office  Patient and myself.  Video work well. Pt could hear me but I could not hear pt. Had to call patient while video ran.   I discussed the limitations of evaluation and management by telemedicine and the availability of in person appointments. The patient expressed understanding and agreed to proceed.  History of Present Illness:  Pt in for follow up.  Pt went to cardiology appointment.Hx of high cholesterol and muscle cramp sensation with prior statin years ago. Pt states she is ok with staring low dose statin if that is what cardiologist decides. Pt was on atorvastin years ago  1. Primary prevention stressed with the patient.  Importance of compliance with diet medication stressed and she vocalized understanding. 2. Mixed dyslipidemia: Diet was emphasized.  Lipids were reviewed.  Will have a Chem-7 and liver panel today.  This is at baseline.  I do not intend to start her on a low-dose statin because of multiple risk factors.  I want to document here that she is an ex-smoker but quit 2 years ago after the amputation.  I told her never to go back to smoking and she promises. 3. Essential hypertension: Blood pressure stable and diet was emphasized.  Lifestyle modification and salt intake issues were discussed. 4. Morbid obesity: Diet was emphasized.  She plans to do better with obesity and lose weight.  I expressed to her the importance of this and she promises to do better. 5. Patient will be seen in follow-up appointment in 6 months or earlier if the patient has any concerns.  Patient and family member had multiple questions which were  answered to their satisfaction.  I did review note sent by cardiologist to pt and he states would plan to start low dose crestor. Sent message to Dr. Cherrie Gauze. He agreed with dragon error on section 2 of A/P. He indicated meant would rx statin.   Pt has history of dvt. Pt is on coumadin. She is attempting weight loss but concern that high vegetable/high vit k diet will effect inr levels. On review of old notes cause of dvt and pe maybe immobility. I never saw in epic coag studies that revealed cause. She did have ivc filter.  Pt has recent minimal high vitamin k.   Bp well controlled on visit with cardiologist.    Observations/Objective: General-no acute distress, pleasant, oriented. Lungs- on inspection lungs appear unlabored. Neck- no tracheal deviation or jvd on inspection. Neuro- gross motor function appears intact.  Assessment and Plan: Hx of high cholesterol. Discussed and reviewed cardiologist note and her correspondence with Dr. Cherrie Gauze. Corresponded today with Dr. Cherrie Gauze to confirm his intention to rx low dose crestor. Decided I could write and let Dr. Cherrie Gauze know. Will notify pt and proceed.  Recent mild high k level. Advise low potassium diet and get scheduled for cmp next week.   Htn well controlled continue norvasc and lotensin.   Elevated sugar in past. Recommend low sugar diet.  Hx of anxiety. On last visit unable to do urine drug screen. When in for lab explain to lab to do oral swab.  Will send message to lab staff as well.  Hx of dvt. Continue coumadin. Discussed using xarelto but on extensive review of chart review unclear if coagulation cause found. Cause may have been immobility at time had lower extremity infection. Decided to refer to hematologist to get opinion on continue coumadin, switching to xarelto or maybe disconintuation of anitcoagulant?  Follow up 6 weeks or as needed. Will repeat cmp and lipid panel on that date.  Follow Up Instructions:     I discussed the assessment and treatment plan with the patient. The patient was provided an opportunity to ask questions and all were answered. The patient agreed with the plan and demonstrated an understanding of the instructions.   The patient was advised to call back or seek an in-person evaluation if the symptoms worsen or if the condition fails to improve as anticipated.   Time spent with patient today was40+   minutes which consisted of  Extensive chart review, discussing diagnoses,  Counseling pt on her visit with cardiolgist, work up treatment and documentation.   Mackie Pai, PA-C   Review of Systems  Constitutional: Negative for chills, fatigue and fever.  Respiratory: Negative for cough, choking, chest tightness, shortness of breath and wheezing.   Cardiovascular: Negative for chest pain and palpitations.  Gastrointestinal: Negative for abdominal pain, blood in stool, diarrhea, nausea and vomiting.  Genitourinary: Negative for dysuria.  Musculoskeletal: Negative for back pain, myalgias and neck pain.  Skin: Negative for rash.  Neurological: Negative for dizziness, seizures, syncope, weakness and headaches.  Hematological: Negative for adenopathy. Does not bruise/bleed easily.  Psychiatric/Behavioral: Negative for behavioral problems, confusion, sleep disturbance and suicidal ideas. The patient is not nervous/anxious.     Past Medical History:  Diagnosis Date  . Allergy   . Anemia associated with acute blood loss 08/22/2016   Formatting of this note is different from the original.  CT abdomen 7/17:  1.  Large left retroperitoneal hematoma with mixed density blood products.  Assessment for active bleeding is not possible without IV contrast. 2.  Contrast in the renal collecting systems presumably from contrast enhanced studies performed on August 20, 2016 consistent with known impaired renal function.   CT abdomen 7/27:   . Anxiety   . Asthma   . Benign essential HTN  08/14/2016  . Cardiac murmur 04/20/2020  . Cigarette smoker 07/15/2014  . COPD (chronic obstructive pulmonary disease) ([place])   . Deep vein thrombosis (DVT) of popliteal vein of both lower extremities ([place]) 08/18/2016   Last Assessment & Plan:  Formatting of this note might be different from the original. Not clear if provoked/unprovoked (pt had trauma to left toe back in 03/2016 with some decreased mobility thereafter) CT chest confirmed acute PE and also concerning for some bilateral upper lobe groundglass opacities Hematology on board and heparin drip started but  discontinued 7/17  2/2 large retroperitoneal he  . Depression   . Depression with anxiety 09/13/2012  . Dizziness 07/19/2016  . Hyperlipidemia   . Hypertension   . Leukocytosis 07/30/2014  . Lymphangitis 06/07/2016  . Mycobacterial infection 06/07/2016  . 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 Nausea 07/19/2016  . Necrosis ([place]) 07/19/2016  . 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 Per dermatology: Please provide the patient with our outpatient clinic phone number [phone]) to call and schedule a follow up appointment for continued management.   Last Assessment & Plan:  Formatting of this note might be  different from the original. Uncertain etiology Vasculitic vs infectious vs malignancy Extensive work up has  . Obesity 09/20/2016   Last Assessment & Plan:  Formatting of this note might be different from the original. Body mass index is 22.67 kg/m.  - For weight loss counseling.  Marland Kitchen Open leg wound, left, sequela 08/17/2016   Formatting of this note might be different from the original. - Chronic left wound starting in April 2018 and worsening despite antibiotics and debridement -Patient had multiple hospitalizations and was treated with steroids and antibiotics with worsening/extension of the wound - ESR 103, CRP 168 - ANA, factor V [place], Antithrombin III, Cardiolite and  antibody IgG, C3 and C4 complement proteinase  . Peripheral vascular disease ([place]) 08/19/2016   Last Assessment & Plan:  Formatting of this note might be different from the original. 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  ASA 81 mg daily PT consult  . Pulmonary embolism without acute cor pulmonale ([facility]) 08/18/2016   Last Assessment & Plan:  Formatting of this note might be different from the original. Uncertain etiology  Was on heparin gtt but discontinued 2/2 large retroperitoneal hematoma (now stable on follow up CT) O2 PRN IVC placed  Vascular following No SOB, SpO2 93% on room air On warfarin, on hold due to supratherapeutic INR, will restart at 68m once able  . 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 Surgical wound, non healing 08/16/2016   Formatting of this note might be different from the original. Added automatically from request for surgery [id number]  Last Assessment & Plan:  Formatting of this note might be different from the original. Presented with large area of necrotic tissue LLE Bilateral popliteal DVT Etiology uncertain:  Ischemic vs vasculitis vs autoimmune vs possible occult malignancy Bx: leukocytoblastic vasculopathy  In  . Tobacco use 08/18/2016   Last Assessment & Plan:  Formatting of this note might be different from the original. Patient has a 30+ pack year history of smoking Discussed low dose CT chest for cancer screening at admission, patient open to this but concerned about extra cost as she has no insurance at this time. No obvious suspicious lesions noted on CTA PE study.  Counseled regarding tobacco cessation     Social History   Socioeconomic History  . Marital status: Widowed    Spouse name: Not on file  . Number of children: Not on file   . Years of education: high schoo  . Highest education level: Not on file  Occupational History  . Occupation: ADM. ASSISTANT    Employer: [employer]  Tobacco Use  . Smoking status: Former Smoker    Packs/day: 1.00    Years: 35.00    Pack years: 35.00    Types: Cigarettes    Quit date: 09/14/2016    Years since quitting: 3.6  . Smokeless tobacco: Never Used  Substance and Sexual Activity  . Alcohol use: No    Alcohol/week: 0.0 standard drinks  . Drug use: No  . Sexual activity: Never  Other Topics Concern  . Not on file  Social History Narrative   Does not exercise.   Social Determinants of Health   Financial Resource Strain: Not on file  Food Insecurity: Not on file  Transportation Needs: Not on file  Physical Activity:  Not on file  Stress: Not on file  Social Connections: Not on file  Intimate Partner Violence: Not on file    Past Surgical History:  Procedure Laterality Date  . FOOT SURGERY     Left  . HIP PINNING     3 each hip  . IRRIGATION AND DEBRIDEMENT ABSCESS N/A 06/27/2016   Procedure: IRRIGATION AND DEBRIDEMENT LEFT LEG WITH SOTFT TISSUE BIOPSY;  Surgeon: Clovis Riley, MD;  Location: [facility];  Service: General;  Laterality: N/A;  . LYMPH NODE BIOPSY Left 06/09/2016   Procedure: BIOPSY LEFT LEG;  Surgeon: Wallace Going, DO;  Location: [facility];  Service: Plastics;  Laterality: Left;  . SLIPPED CAPITAL FEMORAL EPIPHYSIS PINNING      Family History  Problem Relation Age of Onset  . Hypertension Mother   . Hyperlipidemia Mother   . Hypertension Brother   . Asthma Brother     Allergies  Allergen Reactions  . Avelox [Moxifloxacin Hcl In Nacl] Anaphylaxis  . Zocor [Simvastatin - High Dose] Anaphylaxis  . Montelukast Sodium Other (See Comments)    Insomnia  . Pravastatin Other (See Comments)    Myalgias on 6m    Current Outpatient Medications on File Prior to Visit  Medication Sig Dispense Refill  . ALPRAZolam (XANAX) 0.5 MG tablet Take 1  tablet (0.5 mg total) by mouth 2 (two) times daily as needed for anxiety. 60 tablet 3  . amLODipine (NORVASC) 5 MG tablet Take 5 mg by mouth daily.    . benazepril (LOTENSIN) 20 MG tablet Take 20 mg by mouth 2 (two) times daily.    .Marland Kitchengabapentin (NEURONTIN) 300 MG capsule Take 300 mg by mouth at bedtime.    .Marland Kitchenwarfarin (COUMADIN) 5 MG tablet Take 5 mg by mouth daily.     No current facility-administered medications on file prior to visit.    There were no vitals taken for this visit.      Objective:   Physical Exam        Assessment & Plan:

## 2020-04-22 NOTE — Telephone Encounter (Signed)
Dr. Lynnell Dike,   Thanks for seeing this pt. I reviewed your note to her and her notes back. I think in your assessment/plan dragon error. You had mentioned in your note to her you would start crestor if she is in agreement after considering risk vs benefit in light of her history.  If I am understanding correctly then I could prescribe her low dose crestor.  Just wanted to clarify.  Thanks for your help again.  Esperanza Richters, PA-C

## 2020-04-22 NOTE — Patient Instructions (Addendum)
Hx of high cholesterol. Discussed and reviewed cardiologist note and her correspondence with Dr. Lynnell Dike. Corresponded today with Dr. Lynnell Dike to confirm his intention to rx low dose crestor. Decided I could write and let Dr. Lynnell Dike know. Will notify pt and proceed.  Recent mild high k level. Advise low potassium diet and get scheduled for cmp next week.   Htn well controlled continue norvasc and lotensin.   Elevated sugar in past. Recommend low sugar diet.  Hx of anxiety. On last visit unable to do urine drug screen. When in for lab explain to lab to do oral swab.  Will send message to lab staff as well.  Hx of dvt. Continue coumadin. Discussed using xarelto but on extensive review of chart review unclear if coagulation cause found. Cause may have been immobility at time had lower extremity infection. Decided to refer to hematologist to get opinion on continue coumadin, switching to xarelto or maybe disconintuation of anitcoagulant?  Follow up 6 weeks or as needed. Will repeat cmp and lipid panel on that date.

## 2020-04-22 NOTE — Telephone Encounter (Signed)
You are absolutely right Dr.    Clarene Reamer about the miscommunication.  Patient had called our office with these questions and I called her back and it went to her voicemail and therefore I left a voice message.  Essentially she has hyperlipidemia and I wanted to start her on statin therapy.  But I wanted to get a baseline liver check and this was fine so yes it is fine to start her on rosuvastatin 10 mg daily and you can follow-up at that point for her.  Thank you so much have a wonderful evening

## 2020-04-22 NOTE — Telephone Encounter (Signed)
Let pt know talked with Dr. Lynnell Dike. He clarified he is ok with using low dose crestor if pt wants to proceed considering know benefits vs risk.   I could prescribe if pt wants to proceed.  Get scheduled for cmp in one week. She need to have oral swab drug screen when that is done since she is on xanax.  Decided to refer to hematologist to evaluate coumadin use vs xarelto. They can do studies to determine if further antigoagulation needed. Has been many years since dvt and pe.   After crestor started I would repeat liver enzymes and lipid panel at 6 weeks.

## 2020-04-23 ENCOUNTER — Telehealth: Payer: Self-pay | Admitting: Medical

## 2020-04-23 ENCOUNTER — Other Ambulatory Visit: Payer: Self-pay | Admitting: Medical

## 2020-04-23 MED ORDER — ROSUVASTATIN CALCIUM 5 MG PO TABS: 5.0000 mg | ORAL_TABLET | Freq: Every day | ORAL | 1 refills | Status: DC

## 2020-04-23 NOTE — Addendum Note (Signed)
Addended by: Mervin Kung A on: 04/23/2020 04:11 PM   Modules accepted: Orders

## 2020-04-23 NOTE — Telephone Encounter (Signed)
oral swab drug screens are on [employer] back order, okay for UDS?

## 2020-04-23 NOTE — Telephone Encounter (Signed)
Sent in low dose crestor to pt pharmacy.

## 2020-04-23 NOTE — Telephone Encounter (Signed)
Pt agrees to starting Crestor. Scheduled for rpt CMP also scheduled for lipid and liver enzymes in 6 weeks. Pt agrees to referral.

## 2020-04-23 NOTE — Telephone Encounter (Signed)
Rx low dose crestor sent to pharmacy.

## 2020-04-27 ENCOUNTER — Other Ambulatory Visit: Payer: Self-pay

## 2020-04-27 ENCOUNTER — Other Ambulatory Visit (INDEPENDENT_AMBULATORY_CARE_PROVIDER_SITE_OTHER): Payer: Medicare Other

## 2020-04-27 DIAGNOSIS — F411 Generalized anxiety disorder: Secondary | ICD-10-CM

## 2020-04-27 DIAGNOSIS — E875 Hyperkalemia: Secondary | ICD-10-CM

## 2020-04-27 DIAGNOSIS — Z79899 Other long term (current) drug therapy: Secondary | ICD-10-CM

## 2020-04-27 LAB — COMPREHENSIVE METABOLIC PANEL
ALT: 11 U/L (ref 0–35)
AST: 13 U/L (ref 0–37)
Albumin: 3.8 g/dL (ref 3.5–5.2)
Alkaline Phosphatase: 81 U/L (ref 39–117)
BUN: 24 mg/dL — ABNORMAL HIGH (ref 6–23)
CO2: 24 mEq/L (ref 19–32)
Calcium: 8.8 mg/dL (ref 8.4–10.5)
Chloride: 107 mEq/L (ref 96–112)
Creatinine, Ser: 1.1 mg/dL (ref 0.40–1.20)
GFR: 52.36 mL/min — ABNORMAL LOW (ref >60.00)
Glucose, Bld: 85 mg/dL (ref 70–99)
Potassium: 5.4 mEq/L — ABNORMAL HIGH (ref 3.5–5.1)
Sodium: 139 mEq/L (ref 135–145)
Total Bilirubin: 0.4 mg/dL (ref 0.2–1.2)
Total Protein: 7 g/dL (ref 6.0–8.3)

## 2020-04-28 ENCOUNTER — Telehealth: Payer: Self-pay | Admitting: Medical

## 2020-04-28 ENCOUNTER — Other Ambulatory Visit: Payer: Self-pay | Admitting: Medical

## 2020-04-28 DIAGNOSIS — E875 Hyperkalemia: Secondary | ICD-10-CM

## 2020-04-28 MED ORDER — SODIUM POLYSTYRENE SULFONATE 15 GM/60ML PO SUSP: ORAL | 0 refills | Status: DC

## 2020-04-28 MED FILL — BENAZEPRIL HCL 20 MG TABLET: 20 | 30 days supply | Qty: 60 | Fill #0

## 2020-04-28 MED FILL — AMLODIPINE BESYLATE 5 MG TA: 5 | 30 days supply | Qty: 30 | Fill #0

## 2020-04-28 MED FILL — ALPRAZOLAM 0.5 MG TABS: 0.5 | 30 days supply | Qty: 60 | Fill #2

## 2020-04-28 MED FILL — GABAPENTIN 300 MG CAPSULE: 300 | 30 days supply | Qty: 30 | Fill #0

## 2020-04-28 MED FILL — WARFARIN SODIUM 5 MG TABLET: 5 | 30 days supply | Qty: 30 | Fill #2

## 2020-04-28 NOTE — Telephone Encounter (Signed)
Rx Kayexalate sent to patient's pharmacy.

## 2020-04-29 LAB — DRUG MONITORING, PANEL 8 WITH CONFIRMATION, URINE
6 Acetylmorphine: NEGATIVE ng/mL (ref <10)
Alcohol Metabolites: NEGATIVE ng/mL
Alphahydroxyalprazolam: 159 ng/mL — ABNORMAL HIGH (ref <25)
Alphahydroxymidazolam: NEGATIVE ng/mL (ref <50)
Alphahydroxytriazolam: NEGATIVE ng/mL (ref <50)
Aminoclonazepam: NEGATIVE ng/mL (ref <25)
Amphetamines: NEGATIVE ng/mL (ref <500)
Benzodiazepines: POSITIVE ng/mL — AB (ref <100)
Buprenorphine, Urine: NEGATIVE ng/mL (ref <5)
Cocaine Metabolite: NEGATIVE ng/mL (ref <150)
Creatinine: 85.9 mg/dL
Hydroxyethylflurazepam: NEGATIVE ng/mL (ref <50)
Lorazepam: NEGATIVE ng/mL (ref <50)
MDMA: NEGATIVE ng/mL (ref <500)
Marijuana Metabolite: NEGATIVE ng/mL (ref <20)
Nordiazepam: NEGATIVE ng/mL (ref <50)
Opiates: NEGATIVE ng/mL (ref <100)
Oxazepam: NEGATIVE ng/mL (ref <50)
Oxidant: NEGATIVE ug/mL
Oxycodone: NEGATIVE ng/mL (ref <100)
Temazepam: NEGATIVE ng/mL (ref <50)
pH: 4.9 (ref 4.5–9.0)

## 2020-04-29 LAB — DM TEMPLATE

## 2020-04-29 MED FILL — SPS 15 GM/60 ML SUSPENSION: 15 | 16 days supply | Qty: 240 | Fill #0

## 2020-05-05 ENCOUNTER — Other Ambulatory Visit (INDEPENDENT_AMBULATORY_CARE_PROVIDER_SITE_OTHER): Payer: Medicare Other

## 2020-05-05 ENCOUNTER — Other Ambulatory Visit: Payer: Self-pay

## 2020-05-05 ENCOUNTER — Other Ambulatory Visit: Payer: Medicare Other

## 2020-05-05 DIAGNOSIS — E875 Hyperkalemia: Secondary | ICD-10-CM | POA: Diagnosis not present

## 2020-05-05 LAB — COMPREHENSIVE METABOLIC PANEL
ALT: 12 U/L (ref 0–35)
AST: 14 U/L (ref 0–37)
Albumin: 3.8 g/dL (ref 3.5–5.2)
Alkaline Phosphatase: 75 U/L (ref 39–117)
BUN: 24 mg/dL — ABNORMAL HIGH (ref 6–23)
CO2: 28 mEq/L (ref 19–32)
Calcium: 9.1 mg/dL (ref 8.4–10.5)
Chloride: 106 mEq/L (ref 96–112)
Creatinine, Ser: 1.01 mg/dL (ref 0.40–1.20)
GFR: 57.99 mL/min — ABNORMAL LOW (ref >60.00)
Glucose, Bld: 86 mg/dL (ref 70–99)
Potassium: 5.1 mEq/L (ref 3.5–5.1)
Sodium: 139 mEq/L (ref 135–145)
Total Bilirubin: 0.5 mg/dL (ref 0.2–1.2)
Total Protein: 7.1 g/dL (ref 6.0–8.3)

## 2020-05-08 ENCOUNTER — Other Ambulatory Visit (HOSPITAL_BASED_OUTPATIENT_CLINIC_OR_DEPARTMENT_OTHER): Payer: Self-pay

## 2020-05-10 ENCOUNTER — Other Ambulatory Visit: Payer: Medicare Other

## 2020-05-10 ENCOUNTER — Ambulatory Visit: Payer: Medicare Other | Admitting: Family

## 2020-05-17 ENCOUNTER — Other Ambulatory Visit: Payer: Self-pay | Admitting: Family

## 2020-05-17 DIAGNOSIS — I82433 Acute embolism and thrombosis of popliteal vein, bilateral: Secondary | ICD-10-CM

## 2020-05-17 DIAGNOSIS — Z86711 Personal history of pulmonary embolism: Secondary | ICD-10-CM

## 2020-05-17 DIAGNOSIS — D6859 Other primary thrombophilia: Secondary | ICD-10-CM

## 2020-05-18 ENCOUNTER — Inpatient Hospital Stay: Payer: Medicare Other | Attending: Family | Admitting: Family

## 2020-05-18 ENCOUNTER — Inpatient Hospital Stay: Payer: Medicare Other

## 2020-05-18 ENCOUNTER — Other Ambulatory Visit: Payer: Self-pay

## 2020-05-18 VITALS — BP 141/43 | HR 57 | Temp 98.9°F

## 2020-05-18 DIAGNOSIS — Z7901 Long term (current) use of anticoagulants: Secondary | ICD-10-CM | POA: Diagnosis not present

## 2020-05-18 DIAGNOSIS — I10 Essential (primary) hypertension: Secondary | ICD-10-CM | POA: Diagnosis not present

## 2020-05-18 DIAGNOSIS — M25471 Effusion, right ankle: Secondary | ICD-10-CM | POA: Insufficient documentation

## 2020-05-18 DIAGNOSIS — Z86718 Personal history of other venous thrombosis and embolism: Secondary | ICD-10-CM | POA: Diagnosis not present

## 2020-05-18 DIAGNOSIS — Z86711 Personal history of pulmonary embolism: Secondary | ICD-10-CM

## 2020-05-18 DIAGNOSIS — Z79899 Other long term (current) drug therapy: Secondary | ICD-10-CM | POA: Diagnosis not present

## 2020-05-18 DIAGNOSIS — Z87891 Personal history of nicotine dependence: Secondary | ICD-10-CM | POA: Diagnosis not present

## 2020-05-18 DIAGNOSIS — E7211 Homocystinuria: Secondary | ICD-10-CM | POA: Diagnosis not present

## 2020-05-18 DIAGNOSIS — I82433 Acute embolism and thrombosis of popliteal vein, bilateral: Secondary | ICD-10-CM

## 2020-05-18 DIAGNOSIS — R768 Other specified abnormal immunological findings in serum: Secondary | ICD-10-CM | POA: Insufficient documentation

## 2020-05-18 DIAGNOSIS — D6859 Other primary thrombophilia: Secondary | ICD-10-CM

## 2020-05-18 DIAGNOSIS — J449 Chronic obstructive pulmonary disease, unspecified: Secondary | ICD-10-CM | POA: Insufficient documentation

## 2020-05-18 DIAGNOSIS — R7983 Abnormal findings of blood amino-acid level: Secondary | ICD-10-CM

## 2020-05-18 DIAGNOSIS — R252 Cramp and spasm: Secondary | ICD-10-CM | POA: Diagnosis not present

## 2020-05-18 LAB — CBC WITH DIFFERENTIAL (CANCER CENTER ONLY)
Abs Immature Granulocytes: 0.01 10*3/uL (ref 0.00–0.07)
Basophils Absolute: 0 10*3/uL (ref 0.0–0.1)
Basophils Relative: 0 %
Eosinophils Absolute: 0.1 10*3/uL (ref 0.0–0.5)
Eosinophils Relative: 3 %
HCT: 34.3 % — ABNORMAL LOW (ref 36.0–46.0)
Hemoglobin: 10.8 g/dL — ABNORMAL LOW (ref 12.0–15.0)
Immature Granulocytes: 0 %
Lymphocytes Relative: 33 %
Lymphs Abs: 1.8 10*3/uL (ref 0.7–4.0)
MCH: 28.5 pg (ref 26.0–34.0)
MCHC: 31.5 g/dL (ref 30.0–36.0)
MCV: 90.5 fL (ref 80.0–100.0)
Monocytes Absolute: 0.7 10*3/uL (ref 0.1–1.0)
Monocytes Relative: 13 %
Neutro Abs: 2.8 10*3/uL (ref 1.7–7.7)
Neutrophils Relative %: 51 %
Platelet Count: 226 10*3/uL (ref 150–400)
RBC: 3.79 MIL/uL — ABNORMAL LOW (ref 3.87–5.11)
RDW: 13.2 % (ref 11.5–15.5)
WBC Count: 5.5 10*3/uL (ref 4.0–10.5)
nRBC: 0 % (ref 0.0–0.2)

## 2020-05-18 LAB — CMP (CANCER CENTER ONLY)
ALT: 11 U/L (ref 0–44)
AST: 16 U/L (ref 15–41)
Albumin: 3.6 g/dL (ref 3.5–5.0)
Alkaline Phosphatase: 71 U/L (ref 38–126)
Anion gap: 6 (ref 5–15)
BUN: 25 mg/dL — ABNORMAL HIGH (ref 8–23)
CO2: 27 mmol/L (ref 22–32)
Calcium: 8.8 mg/dL — ABNORMAL LOW (ref 8.9–10.3)
Chloride: 104 mmol/L (ref 98–111)
Creatinine: 1.24 mg/dL — ABNORMAL HIGH (ref 0.44–1.00)
GFR, Estimated: 48 mL/min — ABNORMAL LOW (ref >60)
Glucose, Bld: 107 mg/dL — ABNORMAL HIGH (ref 70–99)
Potassium: 4.6 mmol/L (ref 3.5–5.1)
Sodium: 137 mmol/L (ref 135–145)
Total Bilirubin: 0.3 mg/dL (ref 0.3–1.2)
Total Protein: 6.8 g/dL (ref 6.5–8.1)

## 2020-05-18 LAB — LACTATE DEHYDROGENASE: LDH: 192 U/L (ref 98–192)

## 2020-05-18 LAB — ANTITHROMBIN III: AntiThromb III Func: 98 % (ref 75–120)

## 2020-05-18 NOTE — Progress Notes (Signed)
Hematology/Oncology Consultation   Name: Rita Watkins      MRN: [id number]    Location: Room/bed info not found  Date: 05/18/2020 Time:3:07 PM   REFERRING PHYSICIAN: Mackie Pai, PA-C  REASON FOR CONSULT: History of DVT   DIAGNOSIS: History of bilateral popliteal DVT and bilateral lower lobe pulmonary emboli diagnosed 08/2016  HISTORY OF PRESENT ILLNESS: Rita Watkins is a very pleasant 65 yo caucasian female with history of bilateral popliteal DVT and bilateral lower lobe pulmonary diagnosed in July 2018.  At that time she had been hospitalized with a left leg wound that would not heal. She states that this was felt to be due to a flesh eating bacteria (necrotizing vasculitis) acquired either while she was visiting the beach or with a pedicure.  She had an IVC filter placed and started on a heparin drip. Unfortunately, she developed a retroperitoneal bleed while on heparin and had to transition to Coumadin which she is still on at this time.  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  She had some hyper coag testing at that time and was noted to have a mildly elevated homocystine level as well as elevated cardiolipin IgM antibody.  She had been a smoker, < 1 ppd, and quit on 08/24/2016.  She is wanting to know if she is committed to lifelong anticoagulation with coumadin or if she switch to Xarelto or stop all together.  She has some issues with phantom limb pain and takes Neurontin which she states has helped.  She has a prosthesis that she no longer wears due to several falls. She now uses a wheel chair most of the time.  No known family history of thrombus, stroke, heart or liver disease.  No personal history of cancer. Family history includes several paternal uncles and one maternal uncle with lung cancer. All were smokers.  She has had no issues with blood loss on coumadin. No abnormal bruising, no petechiae.  She has  chronic swelling in the right ankle and foot which she states improved when she wears her compression stocking or props up her foot.  No tenderness, numbness or tingling in her extremities.  She has occasional hand cramps.  No history of pregnancy or miscarriage.  No history of diabetes or thyroid disease.  She states that she is up to date on her colonoscopy and goes every 10 years.  She is not interested in having a mammogram. She states that she does not like how it feels. She will let us know if she changes her mind.  She states that she had 2 pins placed in each hip in the 1970's and has had no issues.  No issue with frequent of recurrent infections at this time.  No fever, chills, n/v, cough, rash, dizziness, SOB, chest pain, palpitations, abdominal pain or changes in bowel or bladder habits.  She has maintained a good appetite and is staying well hydrated. Her weight is described as stable.  No ETOH or recreational drug use.  She stays quite sedentary most of the time.  She is here today with her fiance.   ROS: All other 10 point review of systems is negative.   PAST MEDICAL HISTORY:   Past Medical History:  Diagnosis Date  . Allergy   . Anemia associated with acute blood loss 08/22/2016   Formatting of this note is different from the original.  CT abdomen 7/17:  1.  Large left retroperitoneal hematoma with mixed density blood products.  Assessment for active bleeding is not possible without IV contrast. 2.  Contrast in the renal collecting systems presumably from contrast enhanced studies performed on August 20, 2016 consistent with known impaired renal function.   CT abdomen 7/27:   . Anxiety   . Asthma   . Benign essential HTN 08/14/2016  . Cardiac murmur 04/20/2020  . Cigarette smoker 07/15/2014  . COPD (chronic obstructive pulmonary disease) ([place])   . Deep vein thrombosis (DVT) of popliteal vein of both lower extremities ([place]) 08/18/2016   Last Assessment & Plan:  Formatting of this  note might be different from the original. Not clear if provoked/unprovoked (pt had trauma to left toe back in 03/2016 with some decreased mobility thereafter) CT chest confirmed acute PE and also concerning for some bilateral upper lobe groundglass opacities Hematology on board and heparin drip started but  discontinued 7/17  2/2 large retroperitoneal he  . Depression   . Depression with anxiety 09/13/2012  . Dizziness 07/19/2016  . Hyperlipidemia   . Hypertension   . Leukocytosis 07/30/2014  . Lymphangitis 06/07/2016  . Mycobacterial infection 06/07/2016  . 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 Nausea 07/19/2016  . Necrosis ([place]) 07/19/2016  . 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 Per dermatology: Please provide the patient with our outpatient clinic phone number [phone]) to call and schedule a follow up appointment for continued management.   Last Assessment & Plan:  Formatting of this note might be different from the original. Uncertain etiology Vasculitic vs infectious vs malignancy Extensive work up has  . Obesity 09/20/2016   Last Assessment & Plan:  Formatting of this note might be different from the original. Body mass index is 22.67 kg/m.  - For weight loss counseling.  Marland Kitchen Open leg wound, left, sequela 08/17/2016   Formatting of this note might be different from the original. - Chronic left wound starting in April 2018 and worsening despite antibiotics and debridement -Patient had multiple hospitalizations and was treated with steroids and antibiotics with worsening/extension of the wound - ESR 103, CRP 168 - ANA, factor V [place], Antithrombin III, Cardiolite and antibody IgG, C3 and C4 complement proteinase  . Peripheral vascular disease ([place]) 08/19/2016   Last Assessment & Plan:  Formatting of this note might be different from the original. Bilateral popliteal DVT seen on admission Occlusion of the left common iliac  with collateral blood flow to common femoral artery LLE angiogram on 08/22/16 for attempt at revascularization to optimize blood flow for healing Left AKA 7/24 I&D 7/31 and 8/2 with further procedures planned.  ASA 81 mg daily PT consult  . Pulmonary embolism without acute cor pulmonale ([facility]) 08/18/2016   Last Assessment & Plan:  Formatting of this note might be different from the original. Uncertain etiology  Was on heparin gtt but discontinued 2/2 large retroperitoneal hematoma (now stable on follow up CT) O2 PRN IVC placed  Vascular following No SOB, SpO2 93% on room air On warfarin, on hold due to supratherapeutic INR, will restart at 85m once able  . 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 Surgical wound, non healing 08/16/2016   Formatting of this note might be different from the original. Added automatically from request for surgery [id number]  Last Assessment & Plan:  Formatting of this note might be different from the original. Presented with large area of necrotic tissue LLE Bilateral popliteal DVT Etiology uncertain:  Ischemic vs vasculitis vs autoimmune vs possible occult malignancy Bx: leukocytoblastic vasculopathy  In  . Tobacco use 08/18/2016   Last Assessment & Plan:  Formatting of this note might be different from the original. Patient has a 30+ pack year history of smoking Discussed low dose CT chest for cancer screening at admission, patient open to this but concerned about extra cost as she has no insurance at this time. No obvious suspicious lesions noted on CTA PE study.  Counseled regarding tobacco cessation    ALLERGIES: Allergies  Allergen Reactions  . Avelox [Moxifloxacin Hcl In Nacl] Anaphylaxis  . Zocor [Simvastatin - High Dose] Anaphylaxis  . Montelukast Sodium Other (See Comments)    Insomnia  . Pravastatin Other (See Comments)    Myalgias on 72m      MEDICATIONS:  Current Outpatient Medications on File Prior to Visit  Medication Sig Dispense Refill  . ALPRAZolam (XANAX) 0.5 MG  tablet TAKE 1 TABLET (0.5 MG TOTAL) BY MOUTH 2 (TWO) TIMES DAILY AS NEEDED FOR ANXIETY. 60 tablet 3  . amLODipine (NORVASC) 5 MG tablet TAKE 1 TABLET (5 MG TOTAL) BY MOUTH DAILY. 30 tablet 0  . benazepril (LOTENSIN) 20 MG tablet TAKE 1 TABLET (20 MG TOTAL) BY MOUTH 2 (TWO) TIMES DAILY. 60 tablet 0  . gabapentin (NEURONTIN) 300 MG capsule TAKE 1 CAPSULE (300 MG TOTAL) BY MOUTH AT BEDTIME. 30 capsule 0  . rosuvastatin (CRESTOR) 5 MG tablet TAKE 1 TABLET BY MOUTH ONCE DAILY 30 tablet 1  . sodium polystyrene (KAYEXALATE) 15 GM/60ML suspension TAKE 15 ML BY MOUTH DAILY 240 mL 0  . warfarin (COUMADIN) 5 MG tablet Take 5 mg by mouth daily.    .Marland Kitchenwarfarin (COUMADIN) 5 MG tablet TAKE 1 TABLET BY MOUTH ONCE DAILY 30 tablet 5  . [DISCONTINUED] DULoxetine (CYMBALTA) 30 MG capsule Take 1 capsule (30 mg total) by mouth daily. 30 capsule 0   No current facility-administered medications on file prior to visit.     PAST SURGICAL HISTORY Past Surgical History:  Procedure Laterality Date  . FOOT SURGERY     Left  . HIP PINNING     3 each hip  . IRRIGATION AND DEBRIDEMENT ABSCESS N/A 06/27/2016   Procedure: IRRIGATION AND DEBRIDEMENT LEFT LEG WITH SOTFT TISSUE BIOPSY;  Surgeon: CClovis Riley MD;  Location: [facility];  Service: General;  Laterality: N/A;  . LYMPH NODE BIOPSY Left 06/09/2016   Procedure: BIOPSY LEFT LEG;  Surgeon: DWallace Going DO;  Location: [facility];  Service: Plastics;  Laterality: Left;  . SLIPPED CAPITAL FEMORAL EPIPHYSIS PINNING      FAMILY HISTORY: Family History  Problem Relation Age of Onset  . Hypertension Mother   . Hyperlipidemia Mother   . Hypertension Brother   . Asthma Brother     SOCIAL HISTORY:  reports that she quit smoking about 3 years ago. Her smoking use included cigarettes. She has a 35.00 pack-year smoking history. She has never used smokeless tobacco. She reports that she does not drink alcohol and does not use drugs.  PERFORMANCE STATUS: The patient's  performance status is 2 - Symptomatic, <50% confined to bed  PHYSICAL EXAM: Most Recent Vital Signs: There were no vitals taken for this visit. BP (!) 141/43 (BP Location: Left Arm, Patient Position: Sitting)   Pulse (!) 57   Temp 98.9 F (37.2 C) (Oral)   SpO2 100%   General Appearance:    Alert, cooperative, no distress, appears stated age  Head:  Normocephalic, without obvious abnormality, atraumatic  Eyes:    PERRL, conjunctiva/corneas clear, EOM's intact, fundi    benign, both eyes        Throat:   Lips, mucosa, and tongue normal; teeth and gums normal  Neck:   Supple, symmetrical, trachea midline, no adenopathy;    thyroid:  no enlargement/tenderness/nodules; no carotid   bruit or JVD  Back:     Symmetric, no curvature, ROM normal, no CVA tenderness  Lungs:     Clear to auscultation bilaterally, respirations unlabored  Chest Wall:    No tenderness or deformity   Heart:    Regular rate and rhythm, S1 and S2 normal, no murmur, rub   or gallop     Abdomen:     Soft, non-tender, bowel sounds active all four quadrants,    no masses, no organomegaly        Extremities:   Extremities normal, atraumatic, no cyanosis or edema  Pulses:   2+ and symmetric all extremities  Skin:   Skin color, texture, turgor normal, no rashes or lesions  Lymph nodes:   Cervical, supraclavicular, and axillary nodes normal  Neurologic:   CNII-XII intact, normal strength, sensation and reflexes    throughout    LABORATORY DATA:  Results for orders placed or performed in visit on 05/18/20 (from the past 48 hour(s))  CBC with Differential (Cancer Center Only)     Status: Abnormal   Collection Time: 05/18/20  2:31 PM  Result Value Ref Range   WBC Count 5.5 4.0 - 10.5 K/uL   RBC 3.79 (L) 3.87 - 5.11 MIL/uL   Hemoglobin 10.8 (L) 12.0 - 15.0 g/dL   HCT 34.3 (L) 36.0 - 46.0 %   MCV 90.5 80.0 - 100.0 fL   MCH 28.5 26.0 - 34.0 pg   MCHC 31.5 30.0 - 36.0 g/dL   RDW 13.2 11.5 - 15.5 %   Platelet Count  226 150 - 400 K/uL   nRBC 0.0 0.0 - 0.2 %   Neutrophils Relative % 51 %   Neutro Abs 2.8 1.7 - 7.7 K/uL   Lymphocytes Relative 33 %   Lymphs Abs 1.8 0.7 - 4.0 K/uL   Monocytes Relative 13 %   Monocytes Absolute 0.7 0.1 - 1.0 K/uL   Eosinophils Relative 3 %   Eosinophils Absolute 0.1 0.0 - 0.5 K/uL   Basophils Relative 0 %   Basophils Absolute 0.0 0.0 - 0.1 K/uL   Immature Granulocytes 0 %   Abs Immature Granulocytes 0.01 0.00 - 0.07 K/uL    Comment: Performed at [facility] at [facility], [address] [place], [place] [address]      RADIOGRAPHY: No results found.     PATHOLOGY: None  ASSESSMENT/PLAN: Ms. 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 She was also smoking at the time.  She has no prior history of thrombus and has been on Coumadin since shortly after diagnosis.   She is interested and seeing if she needs long term anticoagulation and if so if she can switch to Xarelto so she will not have to have frequent lab work and dietary restrictions.  Hyper coag panel is pending. She had a mildly elevated homocystine and cardiolipin IgM antibody level at diagnosis.  We will follow-up once results are available and determine a  treatment plan.   All questions were answered and she is in agreement with the plan. She was encouraged to contact our office with any questions or concerns. We can certainly see the patient sooner if necessary.   Laverna Peace, NP

## 2020-05-19 ENCOUNTER — Ambulatory Visit: Payer: Medicare Other | Attending: Internal Medicine

## 2020-05-19 ENCOUNTER — Telehealth: Payer: Self-pay | Admitting: *Deleted

## 2020-05-19 DIAGNOSIS — Z23 Encounter for immunization: Secondary | ICD-10-CM

## 2020-05-19 LAB — PROTEIN S ACTIVITY: Protein S Activity: 25 % — ABNORMAL LOW (ref 63–140)

## 2020-05-19 LAB — BETA-2-GLYCOPROTEIN I ABS, IGG/M/A
Beta-2 Glyco I IgG: <9 GPI IgG units (ref 0–20)
Beta-2-Glycoprotein I IgA: <9 GPI IgA units (ref 0–25)
Beta-2-Glycoprotein I IgM: <9 GPI IgM units (ref 0–32)

## 2020-05-19 LAB — CARDIOLIPIN ANTIBODIES, IGG, IGM, IGA
Anticardiolipin IgA: <9 APL U/mL (ref 0–11)
Anticardiolipin IgG: <9 GPL U/mL (ref 0–14)
Anticardiolipin IgM: 35 MPL U/mL — ABNORMAL HIGH (ref 0–12)

## 2020-05-19 LAB — PROTEIN C ACTIVITY: Protein C Activity: 59 % — ABNORMAL LOW (ref 73–180)

## 2020-05-19 LAB — HOMOCYSTEINE: Homocysteine: 18.5 umol/L — ABNORMAL HIGH (ref 0.0–17.2)

## 2020-05-19 LAB — PROTEIN S, TOTAL: Protein S Ag, Total: 53 % — ABNORMAL LOW (ref 60–150)

## 2020-05-19 NOTE — Telephone Encounter (Signed)
No 05/18/20 los to schedule

## 2020-05-19 NOTE — Progress Notes (Signed)
   Covid-19 Vaccination Clinic  Name:  Rita Watkins    MRN: [id number] DOB: March 06, 1955  05/19/2020  [name] was observed post Covid-19 immunization for 15 minutes without incident. She was provided with Vaccine Information Sheet and instruction to access the V-Safe system.   [name] was instructed to call 911 with any severe reactions post vaccine: Marland Kitchen Difficulty breathing  . Swelling of face and throat  . A fast heartbeat  . A bad rash all over body  . Dizziness and weakness   Immunizations Administered    Name Date Dose VIS Date Route   PFIZER Comrnaty(Gray TOP) Covid-19 Vaccine 05/19/2020  9:13 AM 0.3 mL 01/15/2020 Intramuscular   Manufacturer: [employer], [employer]   Lot: [id number]   NDC: [id number]

## 2020-05-20 LAB — PTT-LA INCUB MIX: PTT-LA Incub Mix: 44.3 s (ref 0.0–48.9)

## 2020-05-20 LAB — PROTEIN C, TOTAL: Protein C, Total: 64 % (ref 60–150)

## 2020-05-20 LAB — DRVVT MIX: dRVVT Mix: 36.2 s (ref 0.0–40.4)

## 2020-05-20 LAB — PTT-LA MIX: PTT-LA Mix: 38.9 s (ref 0.0–48.9)

## 2020-05-20 LAB — LUPUS ANTICOAGULANT PANEL
DRVVT: 61.5 s — ABNORMAL HIGH (ref 0.0–47.0)
PTT Lupus Anticoagulant: 56.6 s — ABNORMAL HIGH (ref 0.0–51.9)

## 2020-05-21 LAB — FACTOR 5 LEIDEN

## 2020-05-24 ENCOUNTER — Other Ambulatory Visit (HOSPITAL_BASED_OUTPATIENT_CLINIC_OR_DEPARTMENT_OTHER): Payer: Self-pay

## 2020-05-24 MED ORDER — PFIZER-BIONT COVID-19 VAC-TRIS 30 MCG/0.3ML IM SUSP
INTRAMUSCULAR | 0 refills | Status: DC
  Filled 2020-05-24: qty 0.3, 1d supply, fill #0

## 2020-05-25 ENCOUNTER — Other Ambulatory Visit (HOSPITAL_BASED_OUTPATIENT_CLINIC_OR_DEPARTMENT_OTHER): Payer: Self-pay

## 2020-05-25 LAB — PROTHROMBIN GENE MUTATION

## 2020-05-26 ENCOUNTER — Telehealth: Payer: Self-pay | Admitting: Family

## 2020-05-26 ENCOUNTER — Other Ambulatory Visit: Payer: Self-pay | Admitting: Family

## 2020-05-26 ENCOUNTER — Other Ambulatory Visit (HOSPITAL_BASED_OUTPATIENT_CLINIC_OR_DEPARTMENT_OTHER): Payer: Self-pay

## 2020-05-26 DIAGNOSIS — D6859 Other primary thrombophilia: Secondary | ICD-10-CM

## 2020-05-26 DIAGNOSIS — E7211 Homocystinuria: Secondary | ICD-10-CM

## 2020-05-26 DIAGNOSIS — Z86711 Personal history of pulmonary embolism: Secondary | ICD-10-CM

## 2020-05-26 DIAGNOSIS — I82433 Acute embolism and thrombosis of popliteal vein, bilateral: Secondary | ICD-10-CM

## 2020-05-26 MED ORDER — RIVAROXABAN 20 MG PO TABS
20.0000 mg | ORAL_TABLET | Freq: Every day | ORAL | 3 refills | Status: DC
  Filled 2020-05-26: qty 30, 30d supply, fill #0

## 2020-05-26 MED ORDER — FOLIC ACID 1 MG PO TABS
1.0000 mg | ORAL_TABLET | Freq: Every day | ORAL | 3 refills | Status: DC
  Filled 2020-05-26: qty 30, 30d supply, fill #0
  Filled 2020-06-29: qty 30, 30d supply, fill #1
  Filled 2020-07-29: qty 30, 30d supply, fill #2
  Filled 2020-08-30: qty 30, 30d supply, fill #3

## 2020-05-26 NOTE — Telephone Encounter (Signed)
I was able to speak with Rita Watkins and go over her recent hyper coag panel. She was noted to he low protein C activity, protein S activity and Protain S total and elevated anticardiolipin IgM. This certainly could have been skewed by Coumadin. We will rechack at next visit once on Xarelto. I spoke with Dr. Myna Hidalgo and with her history of extensive clotting in both legs and both lungs it is felt that she would benefit from lifelong anticoagulation. We will go ahead and make the change from coumadin to Xarelto. I have reached out to our financial councilor to see if there is any assistance available for the patient as she is on a fixed income. Until she knows the cost, she will continue taking coumadin. I have left that prescription active for now. We discussed her stopping coumadin and then starting Xarelto the following day per MD recommendation and will let us know when she makes the change so we can remove the coumadin order. She verbalized understanding and agreement with the plan.  Her homocystine level was also slightly high. We will have her start taking folic acid 1 mg PO daily.  We will plan to see her again in another 2 months. Scheduling message sent. No other questions at this time. Patient appreciative of call.

## 2020-05-28 ENCOUNTER — Telehealth: Payer: Self-pay | Admitting: Cardiology

## 2020-05-28 ENCOUNTER — Ambulatory Visit (HOSPITAL_BASED_OUTPATIENT_CLINIC_OR_DEPARTMENT_OTHER): Admission: RE | Admit: 2020-05-28 | Payer: Medicare Other | Source: Ambulatory Visit

## 2020-05-28 NOTE — Telephone Encounter (Signed)
Left message for Lawson Fiscal at the imaging department in [facility] to please return my call in regards to this.

## 2020-05-28 NOTE — Telephone Encounter (Signed)
Patient wanted to let Dr. Tomie China know that she did show up for her echo but had to leave. She is an amputee and spoke with someone names Lawson Fiscal who was supposed to meet her with a wheelchair and bring her to get her echo done but she wanted over an hour and noone ever showed up. She said that she is willing to reschedule but needs someone to guarantee that there will be someone to help her get to her appointment.

## 2020-05-28 NOTE — Telephone Encounter (Signed)
Left vm for pt to callback 

## 2020-05-31 ENCOUNTER — Other Ambulatory Visit: Payer: Self-pay | Admitting: Medical

## 2020-05-31 ENCOUNTER — Other Ambulatory Visit (HOSPITAL_BASED_OUTPATIENT_CLINIC_OR_DEPARTMENT_OTHER): Payer: Self-pay

## 2020-05-31 MED ORDER — BENAZEPRIL HCL 20 MG PO TABS
ORAL_TABLET | Freq: Two times a day (BID) | ORAL | 0 refills | Status: DC
  Filled 2020-05-31: qty 60, 30d supply, fill #0

## 2020-05-31 MED ORDER — GABAPENTIN 300 MG PO CAPS
ORAL_CAPSULE | Freq: Every day | ORAL | 0 refills | Status: DC
  Filled 2020-05-31: qty 30, 30d supply, fill #0

## 2020-05-31 MED ORDER — AMLODIPINE BESYLATE 5 MG PO TABS
ORAL_TABLET | Freq: Every day | ORAL | 0 refills | Status: DC
  Filled 2020-05-31: qty 30, 30d supply, fill #0

## 2020-05-31 MED FILL — Alprazolam Tab 0.5 MG: ORAL | 30 days supply | Qty: 60 | Fill #0 | Status: AC

## 2020-06-01 NOTE — Telephone Encounter (Signed)
Pt contacted pt experience.

## 2020-06-08 ENCOUNTER — Other Ambulatory Visit: Payer: Medicare Other

## 2020-06-29 ENCOUNTER — Other Ambulatory Visit: Payer: Self-pay | Admitting: Medical

## 2020-06-29 ENCOUNTER — Other Ambulatory Visit (HOSPITAL_BASED_OUTPATIENT_CLINIC_OR_DEPARTMENT_OTHER): Payer: Self-pay

## 2020-06-29 MED FILL — Warfarin Sodium Tab 5 MG: ORAL | 30 days supply | Qty: 30 | Fill #0 | Status: AC

## 2020-06-30 ENCOUNTER — Encounter: Payer: Self-pay | Admitting: Medical

## 2020-06-30 ENCOUNTER — Other Ambulatory Visit (HOSPITAL_BASED_OUTPATIENT_CLINIC_OR_DEPARTMENT_OTHER): Payer: Self-pay

## 2020-06-30 ENCOUNTER — Other Ambulatory Visit: Payer: Self-pay

## 2020-06-30 NOTE — Telephone Encounter (Signed)
I refilled pt symbicort yesterday. Does she need refill of all her medications presently?  On message you sent me only stated snmbicort.

## 2020-06-30 NOTE — Progress Notes (Unsigned)
Requesting: Alprazolam Contract: 03/03/2020 UDS: 04/27/20 Last Visit: 04/22/20 (video) Next Visit: none Last Refill:  03/03/2020  Please Advise

## 2020-07-02 ENCOUNTER — Telehealth: Payer: Self-pay | Admitting: Medical

## 2020-07-02 ENCOUNTER — Other Ambulatory Visit (HOSPITAL_BASED_OUTPATIENT_CLINIC_OR_DEPARTMENT_OTHER): Payer: Self-pay

## 2020-07-02 ENCOUNTER — Encounter: Payer: Self-pay | Admitting: Medical

## 2020-07-02 MED ORDER — GABAPENTIN 300 MG PO CAPS
ORAL_CAPSULE | Freq: Every day | ORAL | 0 refills | Status: DC
  Filled 2020-07-02: qty 30, 30d supply, fill #0

## 2020-07-02 MED ORDER — BENAZEPRIL HCL 20 MG PO TABS
ORAL_TABLET | Freq: Two times a day (BID) | ORAL | 0 refills | Status: DC
  Filled 2020-07-02: qty 60, 30d supply, fill #0

## 2020-07-02 MED ORDER — ALPRAZOLAM 0.5 MG PO TABS
ORAL_TABLET | Freq: Two times a day (BID) | ORAL | 2 refills | Status: DC | PRN
  Filled 2020-07-02: qty 60, 30d supply, fill #0

## 2020-07-02 NOTE — Telephone Encounter (Signed)
Pt.notified

## 2020-07-02 NOTE — Telephone Encounter (Signed)
Patient needs xanax I will send in the other medications , sorry thought the MA who had talked to her prior had sent in the other 2 medications

## 2020-07-02 NOTE — Telephone Encounter (Signed)
Requesting: xanax Contract:03/10/2020 UDS:04/2020 Last Visit:04/22/2020 Next Visit:n/a Last Refill:03/03/20  Please Advise

## 2020-07-02 NOTE — Telephone Encounter (Signed)
I sent in xanax. You sent in other meds. Correct. Thanks.

## 2020-07-02 NOTE — Telephone Encounter (Signed)
Rx xanax sent to pt pharmacy. 

## 2020-07-29 ENCOUNTER — Other Ambulatory Visit: Payer: Self-pay | Admitting: Medical

## 2020-07-29 ENCOUNTER — Other Ambulatory Visit (HOSPITAL_BASED_OUTPATIENT_CLINIC_OR_DEPARTMENT_OTHER): Payer: Self-pay

## 2020-07-29 MED ORDER — AMLODIPINE BESYLATE 5 MG PO TABS
ORAL_TABLET | Freq: Every day | ORAL | 0 refills | Status: DC
  Filled 2020-07-29: qty 30, 30d supply, fill #0

## 2020-07-29 MED ORDER — GABAPENTIN 300 MG PO CAPS
ORAL_CAPSULE | Freq: Every day | ORAL | 0 refills | Status: DC
  Filled 2020-07-29: qty 30, 30d supply, fill #0

## 2020-07-29 MED ORDER — BENAZEPRIL HCL 20 MG PO TABS
ORAL_TABLET | Freq: Two times a day (BID) | ORAL | 0 refills | Status: DC
  Filled 2020-07-29: qty 60, 30d supply, fill #0

## 2020-07-29 MED FILL — Warfarin Sodium Tab 5 MG: ORAL | 30 days supply | Qty: 30 | Fill #1 | Status: AC

## 2020-08-02 ENCOUNTER — Other Ambulatory Visit: Payer: Self-pay

## 2020-08-03 ENCOUNTER — Ambulatory Visit: Payer: Medicare Other | Admitting: Cardiology

## 2020-08-30 ENCOUNTER — Other Ambulatory Visit (HOSPITAL_BASED_OUTPATIENT_CLINIC_OR_DEPARTMENT_OTHER): Payer: Self-pay

## 2020-08-30 ENCOUNTER — Other Ambulatory Visit: Payer: Self-pay | Admitting: Medical

## 2020-08-30 DIAGNOSIS — Z7901 Long term (current) use of anticoagulants: Secondary | ICD-10-CM

## 2020-08-31 ENCOUNTER — Other Ambulatory Visit: Payer: Self-pay

## 2020-08-31 ENCOUNTER — Other Ambulatory Visit (HOSPITAL_BASED_OUTPATIENT_CLINIC_OR_DEPARTMENT_OTHER): Payer: Self-pay

## 2020-08-31 ENCOUNTER — Encounter: Payer: Self-pay | Admitting: Medical

## 2020-08-31 MED ORDER — GABAPENTIN 300 MG PO CAPS
ORAL_CAPSULE | Freq: Every day | ORAL | 0 refills | Status: DC
  Filled 2020-08-31: qty 30, 30d supply, fill #0

## 2020-08-31 MED ORDER — AMLODIPINE BESYLATE 5 MG PO TABS
ORAL_TABLET | Freq: Every day | ORAL | 0 refills | Status: DC
  Filled 2020-08-31: qty 30, 30d supply, fill #0

## 2020-08-31 MED ORDER — ALPRAZOLAM 0.5 MG PO TABS
0.5000 mg | ORAL_TABLET | Freq: Two times a day (BID) | ORAL | 0 refills | Status: DC | PRN
  Filled 2020-08-31: qty 60, 30d supply, fill #0

## 2020-08-31 MED ORDER — BENAZEPRIL HCL 20 MG PO TABS
ORAL_TABLET | Freq: Two times a day (BID) | ORAL | 0 refills | Status: DC
  Filled 2020-08-31: qty 60, 30d supply, fill #0

## 2020-08-31 MED ORDER — WARFARIN SODIUM 5 MG PO TABS
ORAL_TABLET | Freq: Every day | ORAL | 5 refills | Status: DC
  Filled 2020-08-31: qty 30, 30d supply, fill #0
  Filled 2020-09-30: qty 30, 30d supply, fill #1
  Filled 2020-10-28: qty 30, 30d supply, fill #2
  Filled 2020-11-25: qty 30, 30d supply, fill #3
  Filled 2020-12-24: qty 30, 30d supply, fill #4
  Filled 2021-01-21: qty 30, 30d supply, fill #5

## 2020-08-31 MED ORDER — ALPRAZOLAM 0.5 MG PO TABS: ORAL_TABLET | Freq: Two times a day (BID) | ORAL | 2 refills | Status: DC | PRN

## 2020-08-31 NOTE — Telephone Encounter (Addendum)
Printed by mistake  Requesting: xanax Contract: 03/30/2020 UDS:04/27/2020 Last Visit:04/22/20 Next Visit:N/A Last Refill:07/30/2020  Please Advise   Rx send 1 month. No refills. Due for controlled med in office visit next month. I think many of her visit recently have been virtual but need to see in office. Needs 40 minutes. [employer] and has long list of questions usually.

## 2020-09-18 ENCOUNTER — Encounter (HOSPITAL_BASED_OUTPATIENT_CLINIC_OR_DEPARTMENT_OTHER): Payer: Self-pay | Admitting: Emergency Medicine

## 2020-09-18 ENCOUNTER — Other Ambulatory Visit: Payer: Self-pay

## 2020-09-18 ENCOUNTER — Emergency Department (HOSPITAL_BASED_OUTPATIENT_CLINIC_OR_DEPARTMENT_OTHER)
Admission: EM | Admit: 2020-09-18 | Discharge: 2020-09-18 | Disposition: A | Discharge status: Home / Self Care | Payer: Medicare Other | Attending: Emergency Medicine | Admitting: Emergency Medicine

## 2020-09-18 DIAGNOSIS — Z87891 Personal history of nicotine dependence: Secondary | ICD-10-CM | POA: Diagnosis not present

## 2020-09-18 DIAGNOSIS — J0100 Acute maxillary sinusitis, unspecified: Secondary | ICD-10-CM | POA: Insufficient documentation

## 2020-09-18 DIAGNOSIS — R0981 Nasal congestion: Secondary | ICD-10-CM | POA: Diagnosis present

## 2020-09-18 DIAGNOSIS — J449 Chronic obstructive pulmonary disease, unspecified: Secondary | ICD-10-CM | POA: Diagnosis not present

## 2020-09-18 DIAGNOSIS — H9201 Otalgia, right ear: Secondary | ICD-10-CM

## 2020-09-18 DIAGNOSIS — Z79899 Other long term (current) drug therapy: Secondary | ICD-10-CM | POA: Insufficient documentation

## 2020-09-18 DIAGNOSIS — I10 Essential (primary) hypertension: Secondary | ICD-10-CM | POA: Insufficient documentation

## 2020-09-18 MED ORDER — FLUTICASONE PROPIONATE 50 MCG/ACT NA SUSP: 2.0000 | Freq: Every day | NASAL | 0 refills | Status: DC

## 2020-09-18 MED ORDER — AMOXICILLIN-POT CLAVULANATE 875-125 MG PO TABS: 1.0000 | ORAL_TABLET | Freq: Two times a day (BID) | ORAL | 0 refills | Status: AC

## 2020-09-18 NOTE — Discharge Instructions (Addendum)
Use Flonase to clear the fluid from your sinuses. Use Tylenol as needed for pain. If you continue to have severe pain or develop fevers, you may start the antibiotic (augmentin).  However be aware that the antibiotic may increase your risk of bleeding while you are on warfarin.  If you start the antibiotic, it is very important that you follow-up after a few days with the INR clinic to get your levels rechecked. Return to the emergency room with any new, worsening, concerning symptoms

## 2020-09-18 NOTE — ED Provider Notes (Signed)
[facility] EMERGENCY DEPARTMENT Provider Note   CSN: [id number] Arrival date & time: 09/18/20  1157     History Chief Complaint  Patient presents with  . Nasal Congestion    Rita Watkins is a 65 y.o. female presenting for evaluation of sinus congestion and right ear pain.  Patient states her symptoms began yesterday.  She reports severe pain and pressure in her maxillary sinuses.  She also is developing worsening right ear pain.  She had a low-grade fever, just over 100, last night.  She is not taken anything for pain or fever including Tylenol.  She denies sick contacts.  She denies headache, cough, chest pain, shortness of breath, nausea, vomiting, abdominal pain.  HPI     Past Medical History:  Diagnosis Date  . Allergy   . Anemia associated with acute blood loss 08/22/2016   Formatting of this note is different from the original.  CT abdomen 7/17:   1.  Large left retroperitoneal hematoma with mixed density blood products.  Assessment for active bleeding is not possible without IV contrast. 2.  Contrast in the renal collecting systems presumably from contrast enhanced studies performed on August 20, 2016 consistent with known impaired renal function.   CT abdomen 7/27:   . Anxiety   . Benign essential HTN 08/14/2016  . Cardiac murmur 04/20/2020  . Cigarette smoker 07/15/2014  . COPD (chronic obstructive pulmonary disease) ([place])   . Deep vein thrombosis (DVT) of popliteal vein of both lower extremities ([place]) 08/18/2016   Last Assessment & Plan:  Formatting of this note might be different from the original. Not clear if provoked/unprovoked (pt had trauma to left toe back in 03/2016 with some decreased mobility thereafter) CT chest confirmed acute PE and also concerning for some bilateral upper lobe groundglass opacities Hematology on board and heparin drip started but  discontinued 7/17  2/2 large retroperitoneal he  . Depression   . Depression with anxiety 09/13/2012  .  Dizziness 07/19/2016  . Essential hypertension 04/20/2020  . History of pulmonary embolism 04/20/2020  . Hyperlipidemia   . Hypertension   . Leukocytosis 07/30/2014  . Lymphangitis 06/07/2016  . Mycobacterial infection 06/07/2016  . 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 Nausea 07/19/2016  . Necrosis ([place]) 07/19/2016  . 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 Per dermatology: Please provide the patient with our outpatient clinic phone number [phone]) to call and schedule a follow up appointment for continued management.   Last Assessment & Plan:  Formatting of this note might be different from the original. Uncertain etiology Vasculitic vs infectious vs malignancy Extensive work up has  . Obesity 09/20/2016   Last Assessment & Plan:  Formatting of this note might be different from the original. Body mass index is 22.67 kg/m.  - For weight loss counseling.  . Obesity (BMI 30.0-34.9) 04/20/2020  . Open leg wound, left, sequela 08/17/2016   Formatting of this note might be different from the original. - Chronic left wound starting in April 2018 and worsening despite antibiotics and debridement -Patient had multiple hospitalizations and was treated with steroids and antibiotics with worsening/extension of the wound - ESR 103, CRP 168 - ANA, factor V [place], Antithrombin III, Cardiolite and antibody IgG, C3 and C4 complement proteinase  . Peripheral vascular disease ([place]) 08/19/2016   Last Assessment & Plan:  Formatting of this note might be different from the original. Bilateral popliteal DVT seen on admission  Occlusion of the left common iliac with collateral blood flow to common femoral artery LLE angiogram on 08/22/16 for attempt at revascularization to optimize blood flow for healing Left AKA 7/24 I&D 7/31 and 8/2 with further procedures planned.  ASA 81 mg daily PT consult  . Pulmonary embolism without acute cor  pulmonale ([facility]) 08/18/2016   Last Assessment & Plan:  Formatting of this note might be different from the original. Uncertain etiology  Was on heparin gtt but discontinued 2/2 large retroperitoneal hematoma (now stable on follow up CT) O2 PRN IVC placed  Vascular following No SOB, SpO2 93% on room air On warfarin, on hold due to supratherapeutic INR, will restart at 9m once able  . 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 Surgical wound, non healing 08/16/2016   Formatting of this note might be different from the original. Added automatically from request for surgery [id number]  Last Assessment & Plan:  Formatting of this note might be different from the original. Presented with large area of necrotic tissue LLE Bilateral popliteal DVT Etiology uncertain:  Ischemic vs vasculitis vs autoimmune vs possible occult malignancy Bx: leukocytoblastic vasculopathy  In  . Tobacco use 08/18/2016   Last Assessment & Plan:  Formatting of this note might be different from the original. Patient has a 30+ pack year history of smoking Discussed low dose CT chest for cancer screening at admission, patient open to this but concerned about extra cost as she has no insurance at this time. No obvious suspicious lesions noted on CTA PE study.  Counseled regarding tobacco cessation    Patient Active Problem List   Diagnosis Date Noted  . Cardiac murmur 04/20/2020  . Obesity (BMI 30.0-34.9) 04/20/2020  . History of pulmonary embolism 04/20/2020  . Essential hypertension 04/20/2020  . Allergy   . Anxiety   . COPD (chronic obstructive pulmonary disease) ([place]   . Depression   . Hyperlipidemia   . Hypertension   . Anemia associated with acute blood loss 08/22/2016  . N[place]rotizing vasculopathy ([place] 08/22/2016  . Peripheral vascular disease ([place] 08/19/2016  . Deep vein thrombosis (DVT) of popliteal vein of both lower extremities ([place] 08/18/2016  . Pulmonary embolism without acute cor pulmonale ([place] 08/18/2016  . Tobacco use  08/18/2016  . Open leg wound, left, sequela 08/17/2016  . Surgical wound, non healing 08/16/2016  . 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 Benign essential HTN 08/14/2016  . Dizziness 07/19/2016  . Nausea 07/19/2016  . Necrosis ([place] 07/19/2016  . Mycobacterial infection 06/07/2016  . Lymphangitis 06/07/2016  . 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 Leukocytosis 07/30/2014  . Depression with anxiety 09/13/2012    Past Surgical History:  Procedure Laterality Date  . FOOT SURGERY     Left  . HIP PINNING     3 each hip  . IRRIGATION AND DEBRIDEMENT ABSCESS N/A 06/27/2016   Procedure: IRRIGATION AND DEBRIDEMENT LEFT LEG WITH SOTFT TISSUE BIOPSY;  Surgeon: CClovis Riley MD;  Location: [facility];  Service: General;  Laterality: N/A;  . LYMPH NODE BIOPSY Left 06/09/2016   Procedure: BIOPSY LEFT LEG;  Surgeon: DWallace Going DO;  Location: [facility];  Service: Plastics;  Laterality: Left;  . SLIPPED CAPITAL FEMORAL EPIPHYSIS PINNING       OB History   No obstetric history on file.     Family History  Problem Relation Age of Onset  . Hypertension Mother   . Hyperlipidemia Mother   . Hypertension Brother   . Asthma Brother  Social History   Tobacco Use  . Smoking status: Former    Packs/day: 1.00    Years: 35.00    Pack years: 35.00    Types: Cigarettes    Quit date: 09/14/2016    Years since quitting: 4.0  . Smokeless tobacco: Never  Substance Use Topics  . Alcohol use: No    Alcohol/week: 0.0 standard drinks  . Drug use: No    Home Medications Prior to Admission medications   Medication Sig Start Date End Date Taking? Authorizing Provider  amoxicillin-clavulanate (AUGMENTIN) 875-125 MG tablet Take 1 tablet by mouth every 12 (twelve) hours for 10 days. 09/18/20 09/28/20 Yes Katrell Milhorn, PA-C  fluticasone (FLONASE) 50 MCG/ACT nasal spray Place 2 sprays into both nostrils daily. 09/18/20  Yes Shaunae Sieloff, PA-C  ALPRAZolam (XANAX) 0.5 MG tablet TAKE  1 TABLET (0.5 MG TOTAL) BY MOUTH 2 (TWO) TIMES DAILY AS NEEDED FOR ANXIETY. 08/31/20 02/27/21  Saguier, Percell Miller, PA-C  ALPRAZolam Duanne Moron) 0.5 MG tablet Take 1 tablet (0.5 mg total) by mouth 2 (two) times daily as needed for anxiety. 08/31/20   Saguier, Percell Miller, PA-C  amLODipine (NORVASC) 5 MG tablet Take 5 mg by mouth daily.    [provider]  amLODipine (NORVASC) 5 MG tablet TAKE 1 TABLET (5 MG TOTAL) BY MOUTH DAILY. 08/31/20 08/31/21  Saguier, Percell Miller, PA-C  benazepril (LOTENSIN) 20 MG tablet Take 20 mg by mouth 2 (two) times daily.    [provider]  benazepril (LOTENSIN) 20 MG tablet TAKE 1 TABLET (20 MG TOTAL) BY MOUTH 2 (TWO) TIMES DAILY. 08/31/20 08/31/21  Saguier, Percell Miller, PA-C  folic acid (FOLVITE) 1 MG tablet Take 1 tablet (1 mg total) by mouth daily. 05/26/20   [place], Holli Humbles, NP  gabapentin (NEURONTIN) 300 MG capsule Take 300 mg by mouth at bedtime.    [provider]  gabapentin (NEURONTIN) 300 MG capsule TAKE 1 CAPSULE (300 MG TOTAL) BY MOUTH AT BEDTIME. 08/31/20 08/31/21  Saguier, Percell Miller, PA-C  rivaroxaban (XARELTO) 20 MG TABS tablet Take 1 tablet (20 mg total) by mouth daily with supper. 05/26/20   [place], Holli Humbles, NP  rosuvastatin (CRESTOR) 5 MG tablet Take 5 mg by mouth daily.    [provider]  warfarin (COUMADIN) 5 MG tablet Take 5 mg by mouth daily.    [provider]  warfarin (COUMADIN) 5 MG tablet TAKE 1 TABLET BY MOUTH ONCE DAILY 08/31/20 08/31/21  Saguier, Percell Miller, PA-C  DULoxetine (CYMBALTA) 30 MG capsule Take 1 capsule (30 mg total) by mouth daily. 02/02/20 03/03/20  Saguier, Percell Miller, PA-C    Allergies    Avelox [moxifloxacin hcl in nacl], Montelukast sodium, and Pravastatin  Review of Systems   Review of Systems  Constitutional:  Positive for fever.  HENT:  Positive for congestion, ear pain, sinus pressure and sinus pain.   Respiratory:  Negative for cough and shortness of breath.    Physical Exam Updated Vital Signs BP (!)  136/48 (BP Location: Right Arm)   Pulse (!) 58   Temp 98.5 F (36.9 C) (Oral)   Resp 18   Ht 5' 9"  (1.753 m)   Wt 102.1 kg   SpO2 98%   BMI 33.23 kg/m   Physical Exam Vitals and nursing note reviewed.  Constitutional:      General: She is not in acute distress.    Appearance: She is well-developed.  HENT:     Head: Normocephalic and atraumatic.     Ears:     Comments:  Right TM bulging with middle ear effusion but no TM erythema.  Left TM is normal.    Nose: Mucosal edema and congestion present.     Right Sinus: Maxillary sinus tenderness present.     Left Sinus: Maxillary sinus tenderness present.     Comments: Nasal mucosal edema and congestion. Tenderness palpation of maxillary sinuses Eyes:     Extraocular Movements: Extraocular movements intact.  Cardiovascular:     Rate and Rhythm: Normal rate and regular rhythm.  Pulmonary:     Effort: Pulmonary effort is normal.     Breath sounds: Normal breath sounds.     Comments: clear lung sounds Abdominal:     General: There is no distension.  Musculoskeletal:        General: Normal range of motion.     Cervical back: Normal range of motion.  Skin:    General: Skin is warm.     Findings: No rash.  Neurological:     Mental Status: She is alert and oriented to person, place, and time.    ED Results / Procedures / Treatments   Labs (all labs ordered are listed, but only abnormal results are displayed) Labs Reviewed - No data to display  EKG None  Radiology No results found.  Procedures Procedures   Medications Ordered in ED Medications - No data to display  ED Course  I have reviewed the triage vital signs and the nursing notes.  Pertinent labs & imaging results that were available during my care of the patient were reviewed by me and considered in my medical decision making (see chart for details).    MDM Rules/Calculators/A&P                           Patient presenting for evaluation of nasal  congestion and right ear pain.  On exam, patient peers nontoxic.  She does have some congestion and tenderness palpation of maxillary sinuses bilaterally.  Right TM is bulging with fluid behind the eardrum, however no erythema.  She is not febrile currently.  Discussed likely sinusitis, potential for early ear infection.  I discussed symptomatic management including Flonase, Tylenol, and fluids.  I stated at this time, I do not believe she needs antibiotics, however in the next couple days if symptoms not improve, she may need antibiotics.  Discussed risks of taking antibiotics while on warfarin including increased risk of bleeding and importance of close follow-up with PCP/to get INR rechecked.  I discussed antibiotic choice with pharmacist, Haywood Pao. At this time, patient appears safe for discharge.  Return precautions given.  Patient states she understands and agrees to plan   Final Clinical Impression(s) / ED Diagnoses Final diagnoses:  Acute maxillary sinusitis, recurrence not specified  Right ear pain    Rx / DC Orders ED Discharge Orders          Ordered    amoxicillin-clavulanate (AUGMENTIN) 875-125 MG tablet  Every 12 hours        09/18/20 1258    fluticasone (FLONASE) 50 MCG/ACT nasal spray  Daily        09/18/20 [place], Fey Coghill, PA-C 09/18/20 Leslie, Wonda Olds, MD 09/19/20 [phone]

## 2020-09-18 NOTE — ED Triage Notes (Signed)
Pt arrives pov with c/o sinus pressure and drainage, R ear pain. Pt reports fever at home of 100. last night. Pt in wheelchair, L leg amputee

## 2020-09-23 ENCOUNTER — Telehealth: Payer: Self-pay | Admitting: Medical

## 2020-09-23 NOTE — Telephone Encounter (Signed)
Left message for patient to call back and schedule Medicare Annual Wellness Visit (AWV) in office.   If not able to come in office, please offer to do virtually or by telephone.  Left office number and my jabber #[phone].  Due for AWVI  Please schedule at anytime with Nurse Health Advisor.   

## 2020-09-30 ENCOUNTER — Other Ambulatory Visit (HOSPITAL_BASED_OUTPATIENT_CLINIC_OR_DEPARTMENT_OTHER): Payer: Self-pay

## 2020-09-30 ENCOUNTER — Other Ambulatory Visit: Payer: Self-pay | Admitting: Family

## 2020-09-30 ENCOUNTER — Other Ambulatory Visit: Payer: Self-pay | Admitting: Medical

## 2020-09-30 DIAGNOSIS — E7211 Homocystinuria: Secondary | ICD-10-CM

## 2020-09-30 MED ORDER — BENAZEPRIL HCL 20 MG PO TABS
ORAL_TABLET | Freq: Two times a day (BID) | ORAL | 2 refills | Status: DC
  Filled 2020-09-30: qty 60, 30d supply, fill #0
  Filled 2020-10-28: qty 60, 30d supply, fill #1
  Filled 2020-11-25: qty 60, 30d supply, fill #2

## 2020-09-30 MED ORDER — AMLODIPINE BESYLATE 5 MG PO TABS
ORAL_TABLET | Freq: Every day | ORAL | 2 refills | Status: DC
  Filled 2020-09-30: qty 30, 30d supply, fill #0
  Filled 2020-11-25: qty 30, 30d supply, fill #1
  Filled 2020-12-24: qty 30, 30d supply, fill #2

## 2020-09-30 MED ORDER — FOLIC ACID 1 MG PO TABS
1.0000 mg | ORAL_TABLET | Freq: Every day | ORAL | 3 refills | Status: DC
  Filled 2020-09-30: qty 30, 30d supply, fill #0
  Filled 2020-10-28: qty 30, 30d supply, fill #1
  Filled 2020-11-25: qty 30, 30d supply, fill #2
  Filled 2020-12-24: qty 30, 30d supply, fill #3

## 2020-09-30 MED ORDER — GABAPENTIN 300 MG PO CAPS
ORAL_CAPSULE | Freq: Every day | ORAL | 2 refills | Status: DC
  Filled 2020-09-30: qty 30, 30d supply, fill #0
  Filled 2020-10-28: qty 30, 30d supply, fill #1
  Filled 2020-11-25: qty 30, 30d supply, fill #2

## 2020-10-01 ENCOUNTER — Other Ambulatory Visit (HOSPITAL_BASED_OUTPATIENT_CLINIC_OR_DEPARTMENT_OTHER): Payer: Self-pay

## 2020-10-06 ENCOUNTER — Ambulatory Visit (INDEPENDENT_AMBULATORY_CARE_PROVIDER_SITE_OTHER): Payer: Medicare Other

## 2020-10-06 VITALS — Ht 69.0 in | Wt 240.0 lb

## 2020-10-06 DIAGNOSIS — Z0000 Encounter for general adult medical examination without abnormal findings: Secondary | ICD-10-CM | POA: Diagnosis not present

## 2020-10-06 NOTE — Progress Notes (Addendum)
Subjective:   Rita Watkins is a 65 y.o. female who presents for an Initial Medicare Annual Wellness Visit.  I connected with  Rita Watkins today by a video enabled telemedicine application and verified that I am speaking with the correct person using two identifiers.  Location of patient:Home Location of provider:Work  Persons participating in the virtual visit: patient, nurse.   I discussed the limitations, risk, security and privacy concerns of evaluation and management by telemedicine. The patient expressed understanding and agreed to proceed.  Some vital signs may be absent or patient reported.   Review of Systems     Cardiac Risk Factors include: advanced age (>102mn, >>49women);hypertension;dyslipidemia;obesity (BMI >30kg/m2)     Objective:    Today's Vitals   10/06/20 1136  Weight: 240 lb (108.9 kg)  Height: 5' 9" (1.753 m)   Body mass index is 35.44 kg/m.  Advanced Directives 10/06/2020 05/18/2020 05/23/2019 08/14/2016 06/26/2016 06/26/2016 06/05/2016  Does Patient Have a Medical Advance Directive? Yes Yes No No Yes Yes Yes  Type of [place]aramed[place]of [place] will Living will - - [place] will HC[place] Point[place] will -  Does patient want to make changes to medical advance directive? - - - - No - Patient declined - No - Patient declined  Copy of [place] Chart? No - copy requested No - copy requested - - No - copy requested No - copy requested -  Would patient like information on creating a medical advance directive? - - - No - Patient declined - - -    Current Medications (verified) Outpatient Encounter Medications as of 10/06/2020  Medication Sig   ALPRAZolam (XANAX) 0.5 MG tablet Take 1 tablet (0.5 mg total) by mouth 2 (two) times daily as needed for anxiety.   amLODipine (NORVASC) 5 MG tablet TAKE 1 TABLET (5 MG TOTAL) BY MOUTH DAILY.   benazepril (LOTENSIN) 20 MG tablet TAKE 1  TABLET (20 MG TOTAL) BY MOUTH 2 (TWO) TIMES DAILY.   fluticasone (FLONASE) 50 MCG/ACT nasal spray Place 2 sprays into both nostrils daily.   folic acid (FOLVITE) 1 MG tablet Take 1 tablet (1 mg total) by mouth daily.   gabapentin (NEURONTIN) 300 MG capsule TAKE 1 CAPSULE (300 MG TOTAL) BY MOUTH AT BEDTIME.   rivaroxaban (XARELTO) 20 MG TABS tablet Take 1 tablet (20 mg total) by mouth daily with supper.   rosuvastatin (CRESTOR) 5 MG tablet Take 5 mg by mouth daily.   warfarin (COUMADIN) 5 MG tablet TAKE 1 TABLET BY MOUTH ONCE DAILY   [DISCONTINUED] ALPRAZolam (XANAX) 0.5 MG tablet TAKE 1 TABLET (0.5 MG TOTAL) BY MOUTH 2 (TWO) TIMES DAILY AS NEEDED FOR ANXIETY.   [DISCONTINUED] amLODipine (NORVASC) 5 MG tablet Take 5 mg by mouth daily.   [DISCONTINUED] benazepril (LOTENSIN) 20 MG tablet Take 20 mg by mouth 2 (two) times daily.   [DISCONTINUED] DULoxetine (CYMBALTA) 30 MG capsule Take 1 capsule (30 mg total) by mouth daily.   [DISCONTINUED] gabapentin (NEURONTIN) 300 MG capsule Take 300 mg by mouth at bedtime.   [DISCONTINUED] warfarin (COUMADIN) 5 MG tablet Take 5 mg by mouth daily.   No facility-administered encounter medications on file as of 10/06/2020.    Allergies (verified) Avelox [moxifloxacin hcl in nacl], Montelukast sodium, and Pravastatin   History: Past Medical History:  Diagnosis Date   Allergy    Anemia associated with acute blood loss 08/22/2016   Formatting of this note is different  from the original.  CT abdomen 7/17:   1.  Large left retroperitoneal hematoma with mixed density blood products.  Assessment for active bleeding is not possible without IV contrast. 2.  Contrast in the renal collecting systems presumably from contrast enhanced studies performed on August 20, 2016 consistent with known impaired renal function.   CT abdomen 7/27:    Anxiety    Benign essential HTN 08/14/2016   Cardiac murmur 04/20/2020   Cigarette smoker 07/15/2014   COPD (chronic obstructive  pulmonary disease) (HCC)    Deep vein thrombosis (DVT) of popliteal vein of both lower extremities ([place]) 08/18/2016   Last Assessment & Plan:  Formatting of this note might be different from the original. Not clear if provoked/unprovoked (pt had trauma to left toe back in 03/2016 with some decreased mobility thereafter) CT chest confirmed acute PE and also concerning for some bilateral upper lobe groundglass opacities Hematology on board and heparin drip started but  discontinued 7/17  2/2 large retroperitoneal he   Depression    Depression with anxiety 09/13/2012   Dizziness 07/19/2016   Essential hypertension 04/20/2020   History of pulmonary embolism 04/20/2020   Hyperlipidemia    Hypertension    Leukocytosis 07/30/2014   Lymphangitis 06/07/2016   Mycobacterial infection 06/07/2016   Mycobacterium infection, 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 Per dermatology: Please provide the patient with our outpatient clinic phone number [phone]) to call and schedule a follow up appointment for continued management.   Last Assessment & Plan:  Formatting of this note might be different from the original. Uncertain etiology Vasculitic vs infectious vs malignancy Extensive work up has   Obesity 09/20/2016   Last Assessment & Plan:  Formatting of this note might be different from the original. Body mass index is 22.67 kg/m.  - For weight loss counseling.   Obesity (BMI 30.0-34.9) 04/20/2020   Open leg wound, left, sequela 08/17/2016   Formatting of this note might be different from the original. - Chronic left wound starting in April 2018 and worsening despite antibiotics and debridement -Patient had multiple hospitalizations and was treated with steroids and antibiotics with worsening/extension of the wound - ESR 103, CRP 168 - ANA, factor V Ley[place], Antithrombin  III, Cardiolite and antibody IgG, C3 and C4 complement proteinase   Peripheral vascular disease ([place]) 08/19/2016   Last Assessment & Plan:  Formatting of this note might be different from the original. 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  ASA 81 mg daily PT consult   Pulmonary embolism without acute cor pulmonale (HCC) 08/18/2016   Last Assessment & Plan:  Formatting of this note might be different from the original. 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 Added automatically from request for surgery [id number]  Last Assessment &  Plan:  Formatting of this note might be different from the original. Presented with large area of necrotic tissue LLE Bilateral popliteal DVT Etiology uncertain:  Ischemic vs vasculitis vs autoimmune vs possible occult malignancy Bx: leukocytoblastic vasculopathy  In   Tobacco use 08/18/2016   Last Assessment & Plan:  Formatting of this note might be different from the original. Patient has a 30+ pack year history of smoking Discussed low dose CT chest for cancer screening at admission, patient open to this but concerned about extra cost as she has no insurance at this time. No obvious suspicious lesions noted on CTA PE study.  Counseled regarding tobacco cessation   Past Surgical History:  Procedure Laterality Date   FOOT SURGERY     Left   HIP PINNING     3 each hip    IRRIGATION AND DEBRIDEMENT ABSCESS N/A 06/27/2016   Procedure: IRRIGATION AND DEBRIDEMENT LEFT LEG WITH SOTFT TISSUE BIOPSY;  Surgeon: Clovis Riley, MD;  Location: [facility];  Service: General;  Laterality: N/A;   LYMPH NODE BIOPSY Left 06/09/2016   Procedure: BIOPSY LEFT LEG;  Surgeon: Wallace Going, DO;  Location: [facility];  Service: Plastics;  Laterality: Left;   SLIPPED CAPITAL FEMORAL EPIPHYSIS PINNING     Family History  Problem Relation Age of Onset   Hypertension Mother    Hyperlipidemia Mother    Hypertension Brother    Asthma Brother    Social History   Socioeconomic History   Marital status: Widowed    Spouse name: Not on file   Number of children: Not on file   Years of education: high schoo   Highest education level: Not on file  Occupational History   Occupation: ADM. ASSISTANT    Employer: [employer]  Tobacco Use   Smoking status: Former    Packs/day: 1.00    Years: 35.00    Pack years: 35.00    Types: Cigarettes    Quit date: 09/14/2016    Years since quitting: 4.0   Smokeless tobacco: Never  Substance and Sexual Activity   Alcohol use: No    Alcohol/week: 0.0 standard drinks   Drug use: No   Sexual activity: Never  Other Topics Concern   Not on file  Social History Narrative   Does not exercise.   Social Determinants of Health   Financial Resource Strain: Low Risk    Difficulty of Paying Living Expenses: Not hard at all  Food Insecurity: No Food Insecurity   Worried About Charity fundraiser in the Last Year: Never true   [place] in the Last Year: Never true  Transportation Needs: No Transportation Needs   Lack of Transportation (Medical): No   Lack of Transportation (Non-Medical): No  Physical Activity: Insufficiently Active   Days of Exercise per Week: 7 days   Minutes of Exercise per Session: 20 min  Stress: No Stress Concern Present   Feeling of Stress : Not at all  Social Connections: Moderately Isolated   Frequency of  Communication with Friends and Family: More than three times a week   Frequency of Social Gatherings with Friends and Family: More than three times a week   Attends Religious Services: More than 4 times per year   Active Member of [employer] or Organizations: No   Attends Archivist Meetings: Never   Marital Status: Widowed    Tobacco Counseling Counseling given: Not Answered   Clinical Intake:  Pre-visit preparation completed: Yes  Pain :  No/denies pain     BMI - recorded: 35.44 Nutritional Status: BMI > 30  Obese Nutritional Risks: None Diabetes: No  How often do you need to have someone help you when you read instructions, pamphlets, or other written materials from your doctor or pharmacy?: 1 - Never  Diabetic?No  Interpreter Needed?: No  Information entered by :: Caroleen Hamman LPN   Activities of Daily Living In your present state of health, do you have any difficulty performing the following activities: 10/06/2020  Hearing? N  Vision? N  Difficulty concentrating or making decisions? N  Walking or climbing stairs? N  Dressing or bathing? N  Doing errands, shopping? N  Preparing Food and eating ? N  Using the Toilet? N  In the past six months, have you accidently leaked urine? N  Do you have problems with loss of bowel control? N  Managing your Medications? N  Managing your Finances? N  Housekeeping or managing your Housekeeping? N  Some recent data might be hidden    Patient Care Team: Saguier, Iris Pert as PCP - General (Physician Assistant)  Indicate any recent Medical Services you may have received from other than [facility] providers in the past year (date may be approximate).     Assessment:   This is a routine wellness examination for Terry.  Hearing/Vision screen Hearing Screening - Comments:: No issues Vision Screening - Comments:: Last eye exam-11/2018  Dietary issues and exercise activities discussed: Current Exercise Habits: Home  exercise routine, Type of exercise: Other - see comments (arm exercises), Time (Minutes): 20, Frequency (Times/Week): 7, Weekly Exercise (Minutes/Week): 140, Intensity: Mild, Exercise limited by: Other - see comments (imapired mobity due to leg amputation)   Goals Addressed             This Visit's Progress    Patient Stated       Maintain  or improve current health       Depression Screen PHQ 2/9 Scores 10/06/2020 07/21/2014 06/05/2014 09/12/2013  PHQ - 2 Score 0 0 0 0  Exception Documentation - Patient refusal - -    Fall Risk Fall Risk  10/06/2020 07/21/2014 06/05/2014 09/12/2013  Falls in the past year? 1 No No No  Number falls in past yr: 0 - - -  Injury with Fall? 0 - - -  Risk for fall due to : History of fall(s) - - -  Follow up Falls prevention discussed - - -    FALL RISK PREVENTION PERTAINING TO THE HOME:  Any stairs in or around the home? Yes  If so, are there any without handrails? No  Home free of loose throw rugs in walkways, pet beds, electrical cords, etc? Yes  Adequate lighting in your home to reduce risk of falls? Yes   ASSISTIVE DEVICES UTILIZED TO PREVENT FALLS:  Life alert? No  Use of a cane, walker or w/c? Yes  Grab bars in the bathroom? Yes  Shower chair or bench in shower? Yes  Elevated toilet seat or a handicapped toilet? No   TIMED UP AND GO:  Was the test performed? No . Virtual visit   Cognitive Function:Normal cognitive status assessed by direct observation by this Nurse Health Advisor. No abnormalities found.          Immunizations Immunization History  Administered Date(s) Administered   Fluad Quad(high Dose 65+) 12/18/2018   Influenza Split 11/20/2013   Influenza Whole 11/10/2012   Influenza, High Dose Seasonal PF 11/29/2015   Influenza,inj,Quad PF,6+ Mos  10/29/2014, 11/15/2017   PFIZER Comirnaty(Gray Top)Covid-19 Tri-Sucrose Vaccine 05/19/2020   PFIZER(Purple Top)SARS-COV-2 Vaccination 04/20/2019, 05/10/2019   Pneumococcal  Conjugate-13 12/18/2018   Pneumococcal Polysaccharide-23 09/14/2011   Tdap 04/09/2008    TDAP status: Due, Education has been provided regarding the importance of this vaccine. Advised may receive this vaccine at local pharmacy or Health Dept. Aware to provide a copy of the vaccination record if obtained from local pharmacy or Health Dept. Verbalized acceptance and understanding.  Flu Vaccine status: Due, Education has been provided regarding the importance of this vaccine. Advised may receive this vaccine at local pharmacy or Health Dept. Aware to provide a copy of the vaccination record if obtained from local pharmacy or Health Dept. Verbalized acceptance and understanding.  Pneumococcal vaccine status: Up to date  Covid-19 vaccine status: Completed vaccines  Qualifies for Shingles Vaccine? Yes   Zostavax completed No   Shingrix Completed?: No.    Education has been provided regarding the importance of this vaccine. Patient has been advised to call insurance company to determine out of pocket expense if they have not yet received this vaccine. Advised may also receive vaccine at local pharmacy or Health Dept. Verbalized acceptance and understanding.  Screening Tests Health Maintenance  Topic Date Due   Zoster Vaccines- Shingrix (1 of 2) Never done   MAMMOGRAM  02/06/2010   COLONOSCOPY (Pts 45-3yr Insurance coverage will need to be confirmed)  02/06/2013   TETANUS/TDAP  04/10/2018   DEXA SCAN  Never done   PNA vac Low Risk Adult (2 of 2 - PPSV23) 12/18/2019   COVID-19 Vaccine (4 - Booster for Pfizer series) 08/18/2020   INFLUENZA VACCINE  09/06/2020   Hepatitis C Screening  Completed   HPV VACCINES  Aged Out    Health Maintenance  Health Maintenance Due  Topic Date Due   Zoster Vaccines- Shingrix (1 of 2) Never done   MAMMOGRAM  02/06/2010   COLONOSCOPY (Pts 45-49yrInsurance coverage will need to be confirmed)  02/06/2013   TETANUS/TDAP  04/10/2018   DEXA SCAN  Never done    PNA vac Low Risk Adult (2 of 2 - PPSV23) 12/18/2019   COVID-19 Vaccine (4 - Booster for Pfizer series) 08/18/2020   INFLUENZA VACCINE  09/06/2020    Colorectal cancer screening: Due-Declined  Mammogram status: Due-Declined  Bone Density status: Due-Declined  Lung Cancer Screening: (Low Dose CT Chest recommended if Age 67-80ears, 30 pack-year currently smoking OR have quit w/in 15years.) does qualify.   Lung Cancer Screening Referral: Declined today  Additional Screening:  Hepatitis C Screening: Completed 06/08/2016  Vision Screening: Recommended annual ophthalmology exams for early detection of glaucoma and other disorders of the eye. Is the patient up to date with their annual eye exam?  No  Who is the provider or what is the name of the office in which the patient attends annual eye exams? [facility]   Dental Screening: Recommended annual dental exams for proper oral hygiene  Community Resource Referral / Chronic Care Management: CRR required this visit?  No   CCM required this visit?  No      Plan:     I have personally reviewed and noted the following in the patient's chart:   Medical and social history Use of alcohol, tobacco or illicit drugs  Current medications and supplements including opioid prescriptions. Patient is not currently taking opioid prescriptions. Functional ability and status Nutritional status Physical activity Advanced directives List of other physicians Hospitalizations, surgeries, and ER visits in  previous 12 months Vitals Screenings to include cognitive, depression, and falls Referrals and appointments  In addition, I have reviewed and discussed with patient certain preventive protocols, quality metrics, and best practice recommendations. A written personalized care plan for preventive services as well as general preventive health recommendations were provided to patient.   Due to this being a virtual visit, the after visit summary with  patients personalized plan was offered to patient via mail or my-chart. Patient would like to access on my-chart.   Marta Antu, LPN   7/00/1749  Nurse Health Advisor   Nurse Notes: None  Reviewed and agree with plan & assessment of lpn.  Mackie Pai, PA-C

## 2020-10-06 NOTE — Patient Instructions (Signed)
Rita Watkins , Thank you for taking time to complete your Medicare Wellness Visit. I appreciate your ongoing commitment to your health goals. Please review the following plan we discussed and let me know if I can assist you in the future.   Screening recommendations/referrals: Colonoscopy: Due-Declined today Mammogram: Due-Declined today Bone Density: Due-Declined today Recommended yearly ophthalmology/optometry visit for glaucoma screening and checkup Recommended yearly dental visit for hygiene and checkup  Vaccinations: Influenza vaccine: Due-10/2020 Pneumococcal vaccine: Up to date Tdap vaccine: Discuss with pharmacy Shingles vaccine: Discuss with pharmacy   Covid-19:Up to date  Advanced directives: See problem list  Conditions/risks identified: See problem list  Next appointment: Follow up in one year for your annual wellness visit 10/11/2021 @ 11:40   Preventive Care 65 Years and Older, Female Preventive care refers to lifestyle choices and visits with your health care provider that can promote health and wellness. What does preventive care include? A yearly physical exam. This is also called an annual well check. Dental exams once or twice a year. Routine eye exams. Ask your health care provider how often you should have your eyes checked. Personal lifestyle choices, including: Daily care of your teeth and gums. Regular physical activity. Eating a healthy diet. Avoiding tobacco and drug use. Limiting alcohol use. Practicing safe sex. Taking low-dose aspirin every day. Taking vitamin and mineral supplements as recommended by your health care provider. What happens during an annual well check? The services and screenings done by your health care provider during your annual well check will depend on your age, overall health, lifestyle risk factors, and family history of disease. Counseling  Your health care provider may ask you questions about your: Alcohol use. Tobacco  use. Drug use. Emotional well-being. Home and relationship well-being. Sexual activity. Eating habits. History of falls. Memory and ability to understand (cognition). Work and work Astronomer. Reproductive health. Screening  You may have the following tests or measurements: Height, weight, and BMI. Blood pressure. Lipid and cholesterol levels. These may be checked every 5 years, or more frequently if you are over 47 years old. Skin check. Lung cancer screening. You may have this screening every year starting at age 39 if you have a 30-pack-year history of smoking and currently smoke or have quit within the past 15 years. Fecal occult blood test (FOBT) of the stool. You may have this test every year starting at age 44. Flexible sigmoidoscopy or colonoscopy. You may have a sigmoidoscopy every 5 years or a colonoscopy every 10 years starting at age 40. Hepatitis C blood test. Hepatitis B blood test. Sexually transmitted disease (STD) testing. Diabetes screening. This is done by checking your blood sugar (glucose) after you have not eaten for a while (fasting). You may have this done every 1-3 years. Bone density scan. This is done to screen for osteoporosis. You may have this done starting at age 41. Mammogram. This may be done every 1-2 years. Talk to your health care provider about how often you should have regular mammograms. Talk with your health care provider about your test results, treatment options, and if necessary, the need for more tests. Vaccines  Your health care provider may recommend certain vaccines, such as: Influenza vaccine. This is recommended every year. Tetanus, diphtheria, and acellular pertussis (Tdap, Td) vaccine. You may need a Td booster every 10 years. Zoster vaccine. You may need this after age 46. Pneumococcal 13-valent conjugate (PCV13) vaccine. One dose is recommended after age 55. Pneumococcal polysaccharide (PPSV23) vaccine. One dose is  recommended  after age 70. Talk to your health care provider about which screenings and vaccines you need and how often you need them. This information is not intended to replace advice given to you by your health care provider. Make sure you discuss any questions you have with your health care provider. Document Released: 02/19/2015 Document Revised: 10/13/2015 Document Reviewed: 11/24/2014 Elsevier Interactive Patient Education  2017 [place] Prevention in the Home Falls can cause injuries. They can happen to people of all ages. There are many things you can do to make your home safe and to help prevent falls. What can I do on the outside of my home? Regularly fix the edges of walkways and driveways and fix any cracks. Remove anything that might make you trip as you walk through a door, such as a raised step or threshold. Trim any bushes or trees on the path to your home. Use bright outdoor lighting. Clear any walking paths of anything that might make someone trip, such as rocks or tools. Regularly check to see if handrails are loose or broken. Make sure that both sides of any steps have handrails. Any raised decks and porches should have guardrails on the edges. Have any leaves, snow, or ice cleared regularly. Use sand or salt on walking paths during winter. Clean up any spills in your garage right away. This includes oil or grease spills. What can I do in the bathroom? Use night lights. Install grab bars by the toilet and in the tub and shower. Do not use towel bars as grab bars. Use non-skid mats or decals in the tub or shower. If you need to sit down in the shower, use a plastic, non-slip stool. Keep the floor dry. Clean up any water that spills on the floor as soon as it happens. Remove soap buildup in the tub or shower regularly. Attach bath mats securely with double-sided non-slip rug tape. Do not have throw rugs and other things on the floor that can make you trip. What can I do  in the bedroom? Use night lights. Make sure that you have a light by your bed that is easy to reach. Do not use any sheets or blankets that are too big for your bed. They should not hang down onto the floor. Have a firm chair that has side arms. You can use this for support while you get dressed. Do not have throw rugs and other things on the floor that can make you trip. What can I do in the kitchen? Clean up any spills right away. Avoid walking on wet floors. Keep items that you use a lot in easy-to-reach places. If you need to reach something above you, use a strong step stool that has a grab bar. Keep electrical cords out of the way. Do not use floor polish or wax that makes floors slippery. If you must use wax, use non-skid floor wax. Do not have throw rugs and other things on the floor that can make you trip. What can I do with my stairs? Do not leave any items on the stairs. Make sure that there are handrails on both sides of the stairs and use them. Fix handrails that are broken or loose. Make sure that handrails are as long as the stairways. Check any carpeting to make sure that it is firmly attached to the stairs. Fix any carpet that is loose or worn. Avoid having throw rugs at the top or bottom of the stairs. If  you do have throw rugs, attach them to the floor with carpet tape. Make sure that you have a light switch at the top of the stairs and the bottom of the stairs. If you do not have them, ask someone to add them for you. What else can I do to help prevent falls? Wear shoes that: Do not have high heels. Have rubber bottoms. Are comfortable and fit you well. Are closed at the toe. Do not wear sandals. If you use a stepladder: Make sure that it is fully opened. Do not climb a closed stepladder. Make sure that both sides of the stepladder are locked into place. Ask someone to hold it for you, if possible. Clearly mark and make sure that you can see: Any grab bars or  handrails. First and last steps. Where the edge of each step is. Use tools that help you move around (mobility aids) if they are needed. These include: Canes. Walkers. Scooters. Crutches. Turn on the lights when you go into a dark area. Replace any light bulbs as soon as they burn out. Set up your furniture so you have a clear path. Avoid moving your furniture around. If any of your floors are uneven, fix them. If there are any pets around you, be aware of where they are. Review your medicines with your doctor. Some medicines can make you feel dizzy. This can increase your chance of falling. Ask your doctor what other things that you can do to help prevent falls. This information is not intended to replace advice given to you by your health care provider. Make sure you discuss any questions you have with your health care provider. Document Released: 11/19/2008 Document Revised: 07/01/2015 Document Reviewed: 02/27/2014 Elsevier Interactive Patient Education  2017 [employer].

## 2020-10-08 ENCOUNTER — Other Ambulatory Visit (HOSPITAL_BASED_OUTPATIENT_CLINIC_OR_DEPARTMENT_OTHER): Payer: Self-pay

## 2020-10-28 ENCOUNTER — Other Ambulatory Visit (HOSPITAL_BASED_OUTPATIENT_CLINIC_OR_DEPARTMENT_OTHER): Payer: Self-pay

## 2020-10-29 ENCOUNTER — Other Ambulatory Visit (HOSPITAL_BASED_OUTPATIENT_CLINIC_OR_DEPARTMENT_OTHER): Payer: Self-pay

## 2020-11-01 ENCOUNTER — Ambulatory Visit: Payer: Medicare Other | Attending: Internal Medicine

## 2020-11-01 ENCOUNTER — Other Ambulatory Visit (HOSPITAL_BASED_OUTPATIENT_CLINIC_OR_DEPARTMENT_OTHER): Payer: Self-pay

## 2020-11-01 DIAGNOSIS — Z23 Encounter for immunization: Secondary | ICD-10-CM

## 2020-11-01 MED ORDER — INFLUENZA VAC A&B SA ADJ QUAD 0.5 ML IM PRSY
PREFILLED_SYRINGE | INTRAMUSCULAR | 0 refills | Status: DC
  Filled 2020-11-01: qty 0.5, 1d supply, fill #0

## 2020-11-01 NOTE — Progress Notes (Signed)
   Covid-19 Vaccination Clinic  Name:  Rita Watkins    MRN: [id number] DOB: [birth year]  11/01/2020  Rita Watkins was observed post Covid-19 immunization for 15 minutes without incident. She was provided with Vaccine Information Sheet and instruction to access the V-Safe system.   Rita Watkins was instructed to call 911 with any severe reactions post vaccine: Difficulty breathing  Swelling of face and throat  A fast heartbeat  A bad rash all over body  Dizziness and weakness

## 2020-11-08 ENCOUNTER — Other Ambulatory Visit (HOSPITAL_BASED_OUTPATIENT_CLINIC_OR_DEPARTMENT_OTHER): Payer: Self-pay

## 2020-11-08 MED ORDER — COVID-19MRNA BIVAL VACC PFIZER 30 MCG/0.3ML IM SUSP
INTRAMUSCULAR | 0 refills | Status: DC
  Filled 2020-11-08: qty 0.3, 1d supply, fill #0

## 2020-11-25 ENCOUNTER — Other Ambulatory Visit (HOSPITAL_BASED_OUTPATIENT_CLINIC_OR_DEPARTMENT_OTHER): Payer: Self-pay

## 2020-12-24 ENCOUNTER — Other Ambulatory Visit (HOSPITAL_BASED_OUTPATIENT_CLINIC_OR_DEPARTMENT_OTHER): Payer: Self-pay

## 2020-12-24 ENCOUNTER — Other Ambulatory Visit: Payer: Self-pay | Admitting: Medical

## 2020-12-24 MED ORDER — GABAPENTIN 300 MG PO CAPS
ORAL_CAPSULE | Freq: Every day | ORAL | 2 refills | Status: DC
  Filled 2020-12-24: qty 30, 30d supply, fill #0
  Filled 2021-01-21: qty 30, 30d supply, fill #1
  Filled 2021-02-19: qty 30, 30d supply, fill #2

## 2020-12-24 MED ORDER — BENAZEPRIL HCL 20 MG PO TABS
ORAL_TABLET | Freq: Two times a day (BID) | ORAL | 2 refills | Status: DC
  Filled 2020-12-24: qty 60, 30d supply, fill #0
  Filled 2021-01-21: qty 60, 30d supply, fill #1
  Filled 2021-02-19: qty 60, 30d supply, fill #2

## 2020-12-27 ENCOUNTER — Other Ambulatory Visit (HOSPITAL_BASED_OUTPATIENT_CLINIC_OR_DEPARTMENT_OTHER): Payer: Self-pay

## 2021-01-21 ENCOUNTER — Other Ambulatory Visit (HOSPITAL_BASED_OUTPATIENT_CLINIC_OR_DEPARTMENT_OTHER): Payer: Self-pay

## 2021-01-21 ENCOUNTER — Other Ambulatory Visit: Payer: Self-pay | Admitting: Medical

## 2021-01-21 ENCOUNTER — Other Ambulatory Visit: Payer: Self-pay | Admitting: Family

## 2021-01-21 DIAGNOSIS — E7211 Homocystinuria: Secondary | ICD-10-CM

## 2021-01-21 MED ORDER — AMLODIPINE BESYLATE 5 MG PO TABS
ORAL_TABLET | Freq: Every day | ORAL | 2 refills | Status: DC
  Filled 2021-01-21: qty 30, 30d supply, fill #0
  Filled 2021-02-19: qty 30, 30d supply, fill #1
  Filled 2021-03-23: qty 30, 30d supply, fill #2

## 2021-01-21 MED ORDER — FOLIC ACID 1 MG PO TABS
1.0000 mg | ORAL_TABLET | Freq: Every day | ORAL | 3 refills | Status: DC
  Filled 2021-01-21: qty 30, 30d supply, fill #0
  Filled 2021-02-19: qty 30, 30d supply, fill #1
  Filled 2021-03-23: qty 30, 30d supply, fill #2
  Filled 2021-04-21: qty 30, 30d supply, fill #3

## 2021-01-25 ENCOUNTER — Other Ambulatory Visit (HOSPITAL_BASED_OUTPATIENT_CLINIC_OR_DEPARTMENT_OTHER): Payer: Self-pay

## 2021-02-19 ENCOUNTER — Other Ambulatory Visit: Payer: Self-pay | Admitting: Medical

## 2021-02-19 DIAGNOSIS — Z7901 Long term (current) use of anticoagulants: Secondary | ICD-10-CM

## 2021-02-21 ENCOUNTER — Other Ambulatory Visit (HOSPITAL_BASED_OUTPATIENT_CLINIC_OR_DEPARTMENT_OTHER): Payer: Self-pay

## 2021-02-21 MED ORDER — WARFARIN SODIUM 5 MG PO TABS
ORAL_TABLET | Freq: Every day | ORAL | 5 refills | Status: DC
  Filled 2021-02-21: qty 30, 30d supply, fill #0
  Filled 2021-03-23: qty 30, 30d supply, fill #1
  Filled 2021-04-21: qty 30, 30d supply, fill #2
  Filled 2021-05-20: qty 30, 30d supply, fill #3
  Filled 2021-06-22: qty 30, 30d supply, fill #4
  Filled 2021-07-19: qty 30, 30d supply, fill #5

## 2021-03-23 ENCOUNTER — Other Ambulatory Visit: Payer: Self-pay | Admitting: Medical

## 2021-03-23 ENCOUNTER — Ambulatory Visit (INDEPENDENT_AMBULATORY_CARE_PROVIDER_SITE_OTHER): Payer: Medicare Other | Admitting: Medical

## 2021-03-23 ENCOUNTER — Other Ambulatory Visit (HOSPITAL_BASED_OUTPATIENT_CLINIC_OR_DEPARTMENT_OTHER): Payer: Self-pay

## 2021-03-23 VITALS — BP 134/50 | HR 58 | Temp 97.6°F | Resp 18

## 2021-03-23 DIAGNOSIS — Z89612 Acquired absence of left leg above knee: Secondary | ICD-10-CM | POA: Diagnosis not present

## 2021-03-23 DIAGNOSIS — I10 Essential (primary) hypertension: Secondary | ICD-10-CM | POA: Diagnosis not present

## 2021-03-23 DIAGNOSIS — D649 Anemia, unspecified: Secondary | ICD-10-CM

## 2021-03-23 DIAGNOSIS — R600 Localized edema: Secondary | ICD-10-CM

## 2021-03-23 DIAGNOSIS — F419 Anxiety disorder, unspecified: Secondary | ICD-10-CM

## 2021-03-23 DIAGNOSIS — R739 Hyperglycemia, unspecified: Secondary | ICD-10-CM

## 2021-03-23 DIAGNOSIS — L089 Local infection of the skin and subcutaneous tissue, unspecified: Secondary | ICD-10-CM | POA: Diagnosis not present

## 2021-03-23 DIAGNOSIS — E1169 Type 2 diabetes mellitus with other specified complication: Secondary | ICD-10-CM | POA: Diagnosis not present

## 2021-03-23 DIAGNOSIS — E785 Hyperlipidemia, unspecified: Secondary | ICD-10-CM

## 2021-03-23 DIAGNOSIS — Z79899 Other long term (current) drug therapy: Secondary | ICD-10-CM

## 2021-03-23 DIAGNOSIS — Z86718 Personal history of other venous thrombosis and embolism: Secondary | ICD-10-CM

## 2021-03-23 LAB — CBC WITH DIFFERENTIAL/PLATELET
Basophils Absolute: 0 10*3/uL (ref 0.0–0.1)
Basophils Relative: 0.6 % (ref 0.0–3.0)
Eosinophils Absolute: 0.2 10*3/uL (ref 0.0–0.7)
Eosinophils Relative: 2.8 % (ref 0.0–5.0)
HCT: 33.5 % — ABNORMAL LOW (ref 36.0–46.0)
Hemoglobin: 10.8 g/dL — ABNORMAL LOW (ref 12.0–15.0)
Lymphocytes Relative: 27.7 % (ref 12.0–46.0)
Lymphs Abs: 1.8 10*3/uL (ref 0.7–4.0)
MCHC: 32.2 g/dL (ref 30.0–36.0)
MCV: 88.4 fl (ref 78.0–100.0)
Monocytes Absolute: 0.6 10*3/uL (ref 0.1–1.0)
Monocytes Relative: 9.2 % (ref 3.0–12.0)
Neutro Abs: 3.8 10*3/uL (ref 1.4–7.7)
Neutrophils Relative %: 59.7 % (ref 43.0–77.0)
Platelets: 229 10*3/uL (ref 150.0–400.0)
RBC: 3.79 Mil/uL — ABNORMAL LOW (ref 3.87–5.11)
RDW: 14.5 % (ref 11.5–15.5)
WBC: 6.4 10*3/uL (ref 4.0–10.5)

## 2021-03-23 LAB — HEMOGLOBIN A1C: Hgb A1c MFr Bld: 5.6 % (ref 4.6–6.5)

## 2021-03-23 LAB — PROTIME-INR
INR: 5.3 ratio — ABNORMAL HIGH (ref 0.8–1.0)
Prothrombin Time: 52.6 s — ABNORMAL HIGH (ref 9.6–13.1)

## 2021-03-23 MED ORDER — ALPRAZOLAM 0.5 MG PO TABS
0.5000 mg | ORAL_TABLET | Freq: Two times a day (BID) | ORAL | 0 refills | Status: DC | PRN
  Filled 2021-03-23: qty 60, 30d supply, fill #0

## 2021-03-23 MED ORDER — BENAZEPRIL HCL 20 MG PO TABS
ORAL_TABLET | Freq: Two times a day (BID) | ORAL | 2 refills | Status: DC
  Filled 2021-03-23: qty 60, 30d supply, fill #0
  Filled 2021-04-21: qty 60, 30d supply, fill #1
  Filled 2021-05-20: qty 60, 30d supply, fill #2

## 2021-03-23 MED ORDER — CEPHALEXIN 500 MG PO CAPS
500.0000 mg | ORAL_CAPSULE | Freq: Two times a day (BID) | ORAL | 0 refills | Status: DC
  Filled 2021-03-23: qty 20, 10d supply, fill #0

## 2021-03-23 MED ORDER — GABAPENTIN 300 MG PO CAPS
ORAL_CAPSULE | Freq: Every day | ORAL | 2 refills | Status: DC
  Filled 2021-03-23: qty 30, 30d supply, fill #0
  Filled 2021-04-21: qty 30, 30d supply, fill #1
  Filled 2021-05-20: qty 30, 30d supply, fill #2

## 2021-03-23 NOTE — Patient Instructions (Addendum)
Right lower extremity pedal edema over the last 2 months.  Resolving partially with compression socks.  History of DVT in the past and no recent INR for approximate 1 year though it has been recommended to get INRs on monthly basis.  Based on exam findings today want you to go downstairs to get right lower extremity ultrasound.  We will make sure no DVT present.  Also placing stat INR order.  Right lower extremity distal pretibial region with pinkness and tenderness to palpation.  Concern for possible skin infection.  Prescribing Keflex antibiotic.  Hypertension controlled presently.  Continue current BP medications.  Mild elevated blood sugar in the past we will get A1c today.  Hyperlipidemia-we will get lipid panel and metabolic panel today.  Mild anemia in the past and we will repeat CBC today.  Anxiety-controlled with Xanax.  We will update your urine drug screen and get you signed controlled med contract.  Refilling Xanax today.  Follow up 1 month or as needed.

## 2021-03-23 NOTE — Progress Notes (Signed)
l  Subjective:    Patient ID: Rita Watkins, female    DOB: [birth year], 66 y.o.   MRN: [id number]  HPI  Pt in follow up.  Recent rt foot swelling. Pt using compression socks and helps but edema will reoccur quickly. No calf or popliteal pain. Pt has hx of dvt. It has been on coumadin but not had inr checked in a year. She has not been following up as planned/requested on monthly basis.   Hx of anxiety. She is on xanax. She needs uds. Will make sure contract updated.  Htn- pt is on lotensin and amlodipine.  Mild anemia in past.  Decreased gfr on last labs.    Review of Systems  Constitutional:  Negative for chills, fatigue and fever.  Respiratory:  Negative for cough, choking, chest tightness, shortness of breath and wheezing.   Cardiovascular:  Negative for chest pain and palpitations.  Gastrointestinal:  Negative for abdominal pain, blood in stool, diarrhea, nausea and vomiting.  Genitourinary:  Negative for dysuria.  Musculoskeletal:  Negative for back pain, myalgias and neck pain.  Skin:  Negative for rash.  Neurological:  Negative for dizziness, seizures, syncope, weakness and headaches.  Hematological:  Negative for adenopathy. Does not bruise/bleed easily.  Psychiatric/Behavioral:  Negative for behavioral problems, confusion, sleep disturbance and suicidal ideas. The patient is not nervous/anxious.     Past Medical History:  Diagnosis Date   Allergy    Anemia associated with acute blood loss 08/22/2016   Formatting of this note is different from the original.  CT abdomen 7/17:   1.  Large left retroperitoneal hematoma with mixed density blood products.  Assessment for active bleeding is not possible without IV contrast. 2.  Contrast in the renal collecting systems presumably from contrast enhanced studies performed on August 20, 2016 consistent with known impaired renal function.   CT abdomen 7/27:    Anxiety    Benign essential HTN 08/14/2016   Cardiac murmur 04/20/2020    Cigarette smoker 07/15/2014   COPD (chronic obstructive pulmonary disease) (HCC)    Deep vein thrombosis (DVT) of popliteal vein of both lower extremities ([place]) 08/18/2016   Last Assessment & Plan:  Formatting of this note might be different from the original. Not clear if provoked/unprovoked (pt had trauma to left toe back in 03/2016 with some decreased mobility thereafter) CT chest confirmed acute PE and also concerning for some bilateral upper lobe groundglass opacities Hematology on board and heparin drip started but  discontinued 7/17  2/2 large retroperitoneal he   Depression    Depression with anxiety 09/13/2012   Dizziness 07/19/2016   Essential hypertension 04/20/2020   History of pulmonary embolism 04/20/2020   Hyperlipidemia    Hypertension    Leukocytosis 07/30/2014   Lymphangitis 06/07/2016   Mycobacterial infection 06/07/2016   Mycobacterium infection, 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 Per dermatology: Please provide the patient with our outpatient clinic phone number [phone]) to call and schedule a follow up appointment for continued management.   Last Assessment & Plan:  Formatting of this note might be different from the original. Uncertain etiology Vasculitic vs infectious vs malignancy Extensive work up has   Obesity 09/20/2016   Last Assessment & Plan:  Formatting of this note might be different from the original. Body mass index is 22.67 kg/m.  - For weight loss counseling.  Obesity (BMI 30.0-34.9) 04/20/2020   Open leg wound, left, sequela 08/17/2016   Formatting of this note might be different from the original. - Chronic left wound starting in April 2018 and worsening despite antibiotics and debridement -Patient had multiple hospitalizations and was treated with steroids and antibiotics with worsening/extension of the wound  - ESR 103, CRP 168 - ANA, factor V Ley[place], Antithrombin III, Cardiolite and antibody IgG, C3 and C4 complement proteinase   Peripheral vascular disease ([place]) 08/19/2016   Last Assessment & Plan:  Formatting of this note might be different from the original. 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  ASA 81 mg daily PT consult   Pulmonary embolism without acute cor pulmonale (HCC) 08/18/2016   Last Assessment & Plan:  Formatting of this note might be different from the original. 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 Added automatically from request for surgery [id number]  Last Assessment & Plan:  Formatting of this note might be different from the original. Presented with large area of necrotic tissue LLE Bilateral popliteal DVT Etiology uncertain:  Ischemic vs vasculitis vs autoimmune vs possible occult malignancy Bx: leukocytoblastic vasculopathy  In   Tobacco use 08/18/2016   Last Assessment & Plan:  Formatting of this note might be different from the original. Patient has a 30+ pack year history of smoking Discussed low dose CT chest for cancer screening at admission, patient open to this but concerned about extra cost as she has no insurance at this time. No obvious suspicious lesions noted on CTA PE study.  Counseled regarding tobacco cessation     Social History   Socioeconomic History   Marital  status: Widowed    Spouse name: Not on file   Number of children: Not on file   Years of education: high schoo   Highest education level: Not on file  Occupational History   Occupation: ADM. ASSISTANT    Employer: [employer]  Tobacco Use   Smoking status: Former    Packs/day: 1.00    Years: 35.00    Pack years: 35.00    Types: Cigarettes    Quit date: 09/14/2016    Years since quitting: 4.5   Smokeless tobacco: Never  Substance and Sexual Activity   Alcohol use: No    Alcohol/week: 0.0 standard drinks   Drug use: No   Sexual activity: Never  Other Topics Concern   Not on file  Social History Narrative   Does not exercise.   Social Determinants of Health   Financial Resource Strain: Low Risk    Difficulty of Paying Living Expenses: Not hard at all  Food Insecurity: No Food Insecurity   Worried About RCharity fundraiserin the Last Year: Never true   RGibsonin the Last Year: Never true  Transportation Needs: No Transportation Needs   Lack of Transportation (Medical): No   Lack of Transportation (  Non-Medical): No  Physical Activity: Insufficiently Active   Days of Exercise per Week: 7 days   Minutes of Exercise per Session: 20 min  Stress: No Stress Concern Present   Feeling of Stress : Not at all  Social Connections: Moderately Isolated   Frequency of Communication with Friends and Family: More than three times a week   Frequency of Social Gatherings with Friends and Family: More than three times a week   Attends Religious Services: More than 4 times per year   Active Member of [employer] or Organizations: No   Attends Archivist Meetings: Never   Marital Status: Widowed  Human resources officer Violence: Not At Risk   Fear of Current or Ex-Partner: No   Emotionally Abused: No   Physically Abused: No   Sexually Abused: No    Past Surgical History:  Procedure Laterality Date   FOOT SURGERY     Left   HIP PINNING     3 each hip   IRRIGATION AND DEBRIDEMENT  ABSCESS N/A 06/27/2016   Procedure: IRRIGATION AND DEBRIDEMENT LEFT LEG WITH SOTFT TISSUE BIOPSY;  Surgeon: Clovis Riley, MD;  Location: [facility];  Service: General;  Laterality: N/A;   LYMPH NODE BIOPSY Left 06/09/2016   Procedure: BIOPSY LEFT LEG;  Surgeon: Wallace Going, DO;  Location: [facility];  Service: Plastics;  Laterality: Left;   SLIPPED CAPITAL FEMORAL EPIPHYSIS PINNING      Family History  Problem Relation Age of Onset   Hypertension Mother    Hyperlipidemia Mother    Hypertension Brother    Asthma Brother     Allergies  Allergen Reactions   Avelox [Moxifloxacin Hcl In Nacl] Anaphylaxis   Montelukast Sodium Other (See Comments)    Insomnia   Pravastatin Other (See Comments)    Myalgias on 110m    Current Outpatient Medications on File Prior to Visit  Medication Sig Dispense Refill   ALPRAZolam (XANAX) 0.5 MG tablet Take 1 tablet (0.5 mg total) by mouth 2 (two) times daily as needed for anxiety. 60 tablet 0   amLODipine (NORVASC) 5 MG tablet TAKE 1 TABLET (5 MG TOTAL) BY MOUTH DAILY. 30 tablet 2   COVID-19 mRNA bivalent vaccine, Pfizer, injection Inject into the muscle. 0.3 mL 0   folic acid (FOLVITE) 1 MG tablet Take 1 tablet (1 mg total) by mouth daily. 30 tablet 3   influenza vaccine adjuvanted (FLUAD) 0.5 ML injection Inject into the muscle. 0.5 mL 0   warfarin (COUMADIN) 5 MG tablet TAKE 1 TABLET BY MOUTH ONCE DAILY 30 tablet 5   [DISCONTINUED] DULoxetine (CYMBALTA) 30 MG capsule Take 1 capsule (30 mg total) by mouth daily. 30 capsule 0   No current facility-administered medications on file prior to visit.    BP (!) 134/50    Pulse (!) 58    Temp 97.6 F (36.4 C)    Resp 18    SpO2 96%       Objective:   Physical Exam  General Mental Status- Alert. General Appearance- Not in acute distress.    Skin General: Color- Normal Color. Moisture- Normal Moisture.   Neck Carotid Arteries- Normal color. Moisture- Normal Moisture. No carotid bruits. No  JVD.   Chest and Lung Exam Auscultation: Breath Sounds:-Normal.   Cardiovascular Auscultation:Rythm- Regular. Murmurs & Other Heart Sounds:Auscultation of the heart reveals- No Murmurs.   Abdomen Inspection:-Inspeection Normal. Palpation/Percussion:Note:No mass. Palpation and Percussion of the abdomen reveal- Non Tender, Non Distended + BS, no rebound or  guarding.     Neurologic Cranial Nerve exam:- CN III-XII intact(No nystagmus), symmetric smile. Strength:- 5/5 equal and symmetric strength both upper and lower extremities.   Rt lower ext-  moderate to severe pedal edema. Lower potion of pretibial area pinkish in color and tender to palpation.  No calf pain on palpation negative Homans' sign.     Assessment & Plan:   Patient Instructions  Right lower extremity pedal edema over the last 2 months.  Resolving partially with compression socks.  History of DVT in the past and no recent INR for approximate 1 year though it has been recommended to get INRs on monthly basis.  Based on exam findings today want you to go downstairs to get right lower extremity ultrasound.  We will make sure no DVT present.  Also placing stat INR order.  Right lower extremity distal pretibial region with pinkness and tenderness to palpation.  Concern for possible skin infection.  Prescribing Keflex antibiotic.  Hypertension controlled presently.  Continue current BP medications.  Mild elevated blood sugar in the past we will get A1c today.  Hyperlipidemia-we will get lipid panel and metabolic panel today.  Mild anemia in the past and we will repeat CBC today.  Anxiety-controlled with Xanax.  We will update your urine drug screen and get you signed controlled med contract.  Refilling Xanax today.  Follow up 1 month or as needed.   Mackie Pai, PA-C

## 2021-03-24 ENCOUNTER — Ambulatory Visit (HOSPITAL_BASED_OUTPATIENT_CLINIC_OR_DEPARTMENT_OTHER): Payer: Medicare Other

## 2021-03-24 ENCOUNTER — Encounter: Payer: Self-pay | Admitting: Medical

## 2021-03-26 ENCOUNTER — Telehealth (HOSPITAL_BASED_OUTPATIENT_CLINIC_OR_DEPARTMENT_OTHER): Payer: Self-pay

## 2021-03-28 ENCOUNTER — Ambulatory Visit (INDEPENDENT_AMBULATORY_CARE_PROVIDER_SITE_OTHER): Payer: Medicare Other | Admitting: *Deleted

## 2021-03-28 DIAGNOSIS — Z7901 Long term (current) use of anticoagulants: Secondary | ICD-10-CM

## 2021-03-28 LAB — POCT INR: INR: 3.1 — AB (ref 2.0–3.0)

## 2021-03-28 NOTE — Progress Notes (Signed)
Patient scheduled for next Monday.

## 2021-03-28 NOTE — Progress Notes (Addendum)
Pt here for INR check per   Goal INR = 2-3  Last INR = 5.3 Pt currently advised: continue to stay off coumadin Friday and Saturday. Sunday morning take 2.5 mg and Monday 2.5 mg in am as well.    Pt denies recent antibiotics, no dietary changes and no unusual bruising / bleeding.  INR today = 3.1  Pt advised per Ramon Dredge to take 5MG  on Sunday and 2.5 all other days. Due for next check 04/04/21  Agree.   04/06/21, PA-C

## 2021-03-29 ENCOUNTER — Telehealth: Payer: Self-pay | Admitting: *Deleted

## 2021-03-29 ENCOUNTER — Other Ambulatory Visit (HOSPITAL_BASED_OUTPATIENT_CLINIC_OR_DEPARTMENT_OTHER): Payer: Self-pay

## 2021-03-29 LAB — DRUG TOX MONITOR 1 W/CONF, ORAL FLD

## 2021-03-29 MED ORDER — ZOSTER VAC RECOMB ADJUVANTED 50 MCG/0.5ML IM SUSR
INTRAMUSCULAR | 0 refills | Status: DC
  Filled 2021-03-29: qty 1, 30d supply, fill #0

## 2021-03-29 NOTE — Telephone Encounter (Signed)
I accidentally advised patient that she could shingles vaccine here.  I called and spoke with her to let her know that she has to get at pharmacy of her choice.

## 2021-04-04 ENCOUNTER — Ambulatory Visit (HOSPITAL_BASED_OUTPATIENT_CLINIC_OR_DEPARTMENT_OTHER)
Admission: RE | Admit: 2021-04-04 | Discharge: 2021-04-04 | Disposition: A | Discharge status: Home / Self Care | Payer: Medicare Other | Source: Ambulatory Visit | Attending: Medical | Admitting: Medical

## 2021-04-04 ENCOUNTER — Other Ambulatory Visit: Payer: Self-pay

## 2021-04-04 ENCOUNTER — Ambulatory Visit (INDEPENDENT_AMBULATORY_CARE_PROVIDER_SITE_OTHER): Payer: Medicare Other | Admitting: Medical

## 2021-04-04 DIAGNOSIS — Z7901 Long term (current) use of anticoagulants: Secondary | ICD-10-CM | POA: Diagnosis not present

## 2021-04-04 DIAGNOSIS — R600 Localized edema: Secondary | ICD-10-CM | POA: Insufficient documentation

## 2021-04-04 DIAGNOSIS — Z86718 Personal history of other venous thrombosis and embolism: Secondary | ICD-10-CM | POA: Diagnosis present

## 2021-04-04 LAB — POCT INR: INR: 2.5 (ref 2.0–3.0)

## 2021-04-04 NOTE — Progress Notes (Signed)
Pt here for INR check per Mackie Pai, PA-C  Goal INR : 2-3  Last INR :  Lab Results  Component Value Date   INR 3.1 (A) 03/28/2021   INR 5.3 (H) 03/23/2021   INR 2.5 (H) 03/03/2020   INR today : 2.5  Pt currently takes Coumadin.  Currently taking 5MG  on Sunday and 2.5 all other days.   Pt denies recent antibiotics, no dietary changes and no unusual bruising / bleeding.  Pt advised per Percell Miller to continue current dosing schedule and follow up in 1 month.  Pt scheduled for 05/03/21  Mackie Pai, PA-C

## 2021-04-04 NOTE — Progress Notes (Deleted)
Pt here for INR check per Esperanza Richters, PA-C  Goal INR : 2-3  Last INR :  Lab Results  Component Value Date   INR 3.1 (A) 03/28/2021   INR 5.3 (H) 03/23/2021   INR 2.5 (H) 03/03/2020   INR today : 2.5  Pt currently takes Coumadin.  Currently taking 5MG  on Sunday and 2.5 all other days.   Pt denies recent antibiotics, no dietary changes and no unusual bruising / bleeding.  Pt advised

## 2021-04-05 ENCOUNTER — Ambulatory Visit (INDEPENDENT_AMBULATORY_CARE_PROVIDER_SITE_OTHER): Payer: Medicare Other | Admitting: Medical

## 2021-04-05 ENCOUNTER — Ambulatory Visit (HOSPITAL_BASED_OUTPATIENT_CLINIC_OR_DEPARTMENT_OTHER)
Admission: RE | Admit: 2021-04-05 | Discharge: 2021-04-05 | Disposition: A | Discharge status: Home / Self Care | Payer: Medicare Other | Source: Ambulatory Visit | Attending: Medical | Admitting: Medical

## 2021-04-05 VITALS — BP 120/70 | HR 70 | Resp 18 | Ht 69.0 in

## 2021-04-05 DIAGNOSIS — I10 Essential (primary) hypertension: Secondary | ICD-10-CM

## 2021-04-05 DIAGNOSIS — R600 Localized edema: Secondary | ICD-10-CM

## 2021-04-05 DIAGNOSIS — L853 Xerosis cutis: Secondary | ICD-10-CM | POA: Diagnosis not present

## 2021-04-05 DIAGNOSIS — R911 Solitary pulmonary nodule: Secondary | ICD-10-CM

## 2021-04-05 DIAGNOSIS — L299 Pruritus, unspecified: Secondary | ICD-10-CM | POA: Diagnosis not present

## 2021-04-05 NOTE — Addendum Note (Signed)
Addended by: Gwenevere Abbot on: 04/05/2021 07:43 PM   Modules accepted: Orders

## 2021-04-05 NOTE — Progress Notes (Signed)
Subjective:    Patient ID: Rita Watkins, female    DOB: January 19, 1956, 66 y.o.   MRN: [id number]  HPI Pt rt ankle is swollen.   Recent rt lower ext [place] negative for DVT. INR is in normal range.  Last visit gave antibiotic for rt lower ext pending work up.   Last visit she had moderate to severe pedal edema. Lower potion of pretibial area pinkish in color and tender to palpation.  No calf pain on palpation negative Homans' sign.   The pink area did resolve and took keflex.  Pt does use compression socks and does help but then after using leg will swell again.    Review of Systems  Constitutional:  Negative for chills, fatigue and fever.  Respiratory:  Negative for cough, chest tightness, shortness of breath and wheezing.   Cardiovascular:  Negative for chest pain and palpitations.  Gastrointestinal:  Negative for abdominal distention, abdominal pain, blood in stool and constipation.  Musculoskeletal:        Rt lower ext- pedal edema.  Skin:  Negative for rash.  Neurological:  Negative for dizziness, seizures, syncope, weakness and headaches.  Hematological:  Negative for adenopathy. Does not bruise/bleed easily.  Psychiatric/Behavioral:  Negative for confusion.    Past Medical History:  Diagnosis Date   Allergy    Anemia associated with acute blood loss 08/22/2016   Formatting of this note is different from the original.  CT abdomen 7/17:   1.  Large left retroperitoneal hematoma with mixed density blood products.  Assessment for active bleeding is not possible without IV contrast. 2.  Contrast in the renal collecting systems presumably from contrast enhanced studies performed on August 20, 2016 consistent with known impaired renal function.   CT abdomen 7/27:    Anxiety    Benign essential HTN 08/14/2016   Cardiac murmur 04/20/2020   Cigarette smoker 07/15/2014   COPD (chronic obstructive pulmonary disease) (HCC)    Deep vein thrombosis (DVT) of popliteal vein of both lower  extremities ([place]) 08/18/2016   Last Assessment & Plan:  Formatting of this note might be different from the original. Not clear if provoked/unprovoked (pt had trauma to left toe back in 03/2016 with some decreased mobility thereafter) CT chest confirmed acute PE and also concerning for some bilateral upper lobe groundglass opacities Hematology on board and heparin drip started but  discontinued 7/17  2/2 large retroperitoneal he   Depression    Depression with anxiety 09/13/2012   Dizziness 07/19/2016   Essential hypertension 04/20/2020   History of pulmonary embolism 04/20/2020   Hyperlipidemia    Hypertension    Leukocytosis 07/30/2014   Lymphangitis 06/07/2016   Mycobacterial infection 06/07/2016   Mycobacterium infection, 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 Per dermatology: Please provide the patient with our outpatient clinic phone number [phone]) to call and schedule a follow up appointment for continued management.   Last Assessment & Plan:  Formatting of this note might be different from the original. Uncertain etiology Vasculitic vs infectious vs malignancy Extensive work up has   Obesity 09/20/2016   Last Assessment & Plan:  Formatting of this note might be different from the original. Body mass index is 22.67 kg/m.  - For weight loss counseling.   Obesity (BMI 30.0-34.9) 04/20/2020   Open leg wound, left, sequela 08/17/2016   Formatting of this  note might be different from the original. - Chronic left wound starting in April 2018 and worsening despite antibiotics and debridement -Patient had multiple hospitalizations and was treated with steroids and antibiotics with worsening/extension of the wound - ESR 103, CRP 168 - ANA, factor V Leyden, Antithrombin III, Cardiolite and antibody IgG, C3 and C4 complement proteinase   Peripheral vascular  disease ([place]) 08/19/2016   Last Assessment & Plan:  Formatting of this note might be different from the original. 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  ASA 81 mg daily PT consult   Pulmonary embolism without acute cor pulmonale (HCC) 08/18/2016   Last Assessment & Plan:  Formatting of this note might be different from the original. Uncertain etiology  Was on heparin gtt but discontinued 2/2 large retroperitoneal hematoma (now stable on follow up CT) O2 PRN IVC placed  Vascular following No SOB, SpO2 93% on room air On warfarin, on hold due to supratherapeutic INR, will restart at 57m once able   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   Surgical wound, non healing 08/16/2016   Formatting of this note might be different from the original. Added automatically from request for surgery [id number]  Last Assessment & Plan:  Formatting of this note might be different from the original. Presented with large area of necrotic tissue LLE Bilateral popliteal DVT Etiology uncertain:  Ischemic vs vasculitis vs autoimmune vs possible occult malignancy Bx: leukocytoblastic vasculopathy  In   Tobacco use 08/18/2016   Last Assessment & Plan:  Formatting of this note might be different from the original. Patient has a 30+ pack year history of smoking Discussed low dose CT chest for cancer screening at admission, patient open to this but concerned about extra cost as she has no insurance at this time. No obvious suspicious lesions noted on CTA PE study.  Counseled regarding tobacco cessation     Social History   Socioeconomic History   Marital status: Widowed    Spouse name: Not on file   Number of children: Not on file   Years of education: high schoo   Highest education level: Not on  file  Occupational History   Occupation: ADM. ASSISTANT    Employer: [employer]  Tobacco Use   Smoking status: Former    Packs/day: 1.00    Years: 35.00    Pack years: 35.00    Types: Cigarettes    Quit date: 09/14/2016    Years since quitting: 4.5   Smokeless tobacco: Never  Substance and Sexual Activity   Alcohol use: No    Alcohol/week: 0.0 standard drinks   Drug use: No   Sexual activity: Never  Other Topics Concern   Not on file  Social History Narrative   Does not exercise.   Social Determinants of Health   Financial Resource Strain: Low Risk    Difficulty of Paying Living Expenses: Not hard at all  Food Insecurity: No Food Insecurity   Worried About RCharity fundraiserin the Last Year: Never true   [place]ast [place] the Last Year: Never true  Transportation Needs: No Transportation Needs   Lack of Transportation (Medical): No   Lack of Transportation (Non-Medical): No  Physical Activity: Insufficiently Active   Days of Exercise per Week: 7 days  Minutes of Exercise per Session: 20 min  Stress: No Stress Concern Present   Feeling of Stress : Not at all  Social Connections: Moderately Isolated   Frequency of Communication with Friends and Family: More than three times a week   Frequency of Social Gatherings with Friends and Family: More than three times a week   Attends Religious Services: More than 4 times per year   Active Member of [employer] or Organizations: No   Attends Archivist Meetings: Never   Marital Status: Widowed  Human resources officer Violence: Not At Risk   Fear of Current or Ex-Partner: No   Emotionally Abused: No   Physically Abused: No   Sexually Abused: No    Past Surgical History:  Procedure Laterality Date   FOOT SURGERY     Left   HIP PINNING     3 each hip   IRRIGATION AND DEBRIDEMENT ABSCESS N/A 06/27/2016   Procedure: IRRIGATION AND DEBRIDEMENT LEFT LEG WITH SOTFT TISSUE BIOPSY;  Surgeon: Clovis Riley, MD;  Location: [facility];   Service: General;  Laterality: N/A;   LYMPH NODE BIOPSY Left 06/09/2016   Procedure: BIOPSY LEFT LEG;  Surgeon: Wallace Going, DO;  Location: [facility];  Service: Plastics;  Laterality: Left;   SLIPPED CAPITAL FEMORAL EPIPHYSIS PINNING      Family History  Problem Relation Age of Onset   Hypertension Mother    Hyperlipidemia Mother    Hypertension Brother    Asthma Brother     Allergies  Allergen Reactions   Avelox [Moxifloxacin Hcl In Nacl] Anaphylaxis   Montelukast Sodium Other (See Comments)    Insomnia   Pravastatin Other (See Comments)    Myalgias on 85m    Current Outpatient Medications on File Prior to Visit  Medication Sig Dispense Refill   ALPRAZolam (XANAX) 0.5 MG tablet Take 1 tablet (0.5 mg total) by mouth 2 (two) times daily as needed for anxiety. 60 tablet 0   amLODipine (NORVASC) 5 MG tablet TAKE 1 TABLET (5 MG TOTAL) BY MOUTH DAILY. 30 tablet 2   benazepril (LOTENSIN) 20 MG tablet TAKE 1 TABLET (20 MG TOTAL) BY MOUTH 2 (TWO) TIMES DAILY. 60 tablet 2   cephALEXin (KEFLEX) 500 MG capsule Take 1 capsule (500 mg total) by mouth 2 (two) times daily. 20 capsule 0   COVID-19 mRNA bivalent vaccine, Pfizer, injection Inject into the muscle. 0.3 mL 0   folic acid (FOLVITE) 1 MG tablet Take 1 tablet (1 mg total) by mouth daily. 30 tablet 3   gabapentin (NEURONTIN) 300 MG capsule TAKE 1 CAPSULE (300 MG TOTAL) BY MOUTH AT BEDTIME. 30 capsule 2   influenza vaccine adjuvanted (FLUAD) 0.5 ML injection Inject into the muscle. 0.5 mL 0   warfarin (COUMADIN) 5 MG tablet TAKE 1 TABLET BY MOUTH ONCE DAILY 30 tablet 5   [DISCONTINUED] DULoxetine (CYMBALTA) 30 MG capsule Take 1 capsule (30 mg total) by mouth daily. 30 capsule 0   No current facility-administered medications on file prior to visit.    BP 120/70    Pulse 70    Resp 18    Ht 5' 9"  (1.753 m)    SpO2 98%    BMI 35.44 kg/m        Objective:   Physical Exam  General- No acute distress. Pleasant patient. Neck-  Full range of motion, no jvd Lungs- Clear, even and unlabored. Heart- regular rate and rhythm. Neurologic- CNII- XII grossly intact.   Right lower extremity-pretibial  area shows 1+ pedal edema.  Negative Homans' sign.  Former pinkish-red now resolved.  Small linear scabs/scratches present from recent itching of the area that feels dry.    Assessment & Plan:   Patient Instructions  Right lower extremity pedal edema for the past 2 weeks.  For pinkish-red area resolved with antibiotics.  Right lower extremity ultrasound did not show DVT.  Your INR was high but is now in normal range of 2.5.   Presently we will get chest x-ray, BNP and CMP to evaluate cardiac cause of pedal edema.  I think that work-up will be negative and because of the edema could be dependent edema/gravity related.  Considering possible calcium channel/amlodipine related.   We will follow lab results and imaging results and update you when those are in.  Your blood pressure today is 120/70.  I want to get blood pressure cuff over-the-counter check blood pressure daily.  Update me on Friday with those readings.  If your blood pressures are tightly controlled/lower than 120/70 then option for edema might need to decrease amlodipine to 2.5 mg daily.  Using compression socks daily elevating also recommended.  If blood pressure moderately elevated and edema present could prescribe low-dose diuretic.  Follow-up date to be determined after lab and imaging review.   Mackie Pai, PA-C

## 2021-04-05 NOTE — Patient Instructions (Addendum)
Right lower extremity pedal edema for the past 2 weeks.  For pinkish-red area resolved with antibiotics.  Right lower extremity ultrasound did not show DVT.  Your INR was high but is now in normal range of 2.5.   Presently we will get chest x-ray, BNP and CMP to evaluate cardiac cause of pedal edema.  I think that work-up will be negative and because of the edema could be dependent edema/gravity related.  Considering possible calcium channel/amlodipine related.   We will follow lab results and imaging results and update you when those are in.  Your blood pressure today is 120/70.  I want to get blood pressure cuff over-the-counter check blood pressure daily.  Update me on Friday with those readings.  If your blood pressures are tightly controlled/lower than 120/70 then option for edema might need to decrease amlodipine to 2.5 mg daily.  Using compression socks daily elevating also recommended.  If blood pressure moderately elevated and edema present could prescribe low-dose diuretic.  Recommend continue moisturize right lower extremity and can use combination of Benadryl gel and hydrocortisone cream to the itchy area.  Follow-up date to be determined after lab and imaging review.

## 2021-04-06 LAB — COMPREHENSIVE METABOLIC PANEL
ALT: 8 U/L (ref 0–35)
AST: 12 U/L (ref 0–37)
Albumin: 3.8 g/dL (ref 3.5–5.2)
Alkaline Phosphatase: 81 U/L (ref 39–117)
BUN: 24 mg/dL — ABNORMAL HIGH (ref 6–23)
CO2: 24 mEq/L (ref 19–32)
Calcium: 8.8 mg/dL (ref 8.4–10.5)
Chloride: 107 mEq/L (ref 96–112)
Creatinine, Ser: 1.22 mg/dL — ABNORMAL HIGH (ref 0.40–1.20)
GFR: 45.93 mL/min — ABNORMAL LOW (ref >60.00)
Glucose, Bld: 78 mg/dL (ref 70–99)
Potassium: 5.3 mEq/L — ABNORMAL HIGH (ref 3.5–5.1)
Sodium: 137 mEq/L (ref 135–145)
Total Bilirubin: 0.5 mg/dL (ref 0.2–1.2)
Total Protein: 7.1 g/dL (ref 6.0–8.3)

## 2021-04-06 LAB — BRAIN NATRIURETIC PEPTIDE: Pro B Natriuretic peptide (BNP): 141 pg/mL — ABNORMAL HIGH (ref 0.0–100.0)

## 2021-04-09 ENCOUNTER — Encounter: Payer: Self-pay | Admitting: Medical

## 2021-04-18 ENCOUNTER — Other Ambulatory Visit (HOSPITAL_BASED_OUTPATIENT_CLINIC_OR_DEPARTMENT_OTHER): Payer: Self-pay

## 2021-04-18 ENCOUNTER — Encounter: Payer: Self-pay | Admitting: Medical

## 2021-04-18 MED ORDER — AMLODIPINE BESYLATE 2.5 MG PO TABS
2.5000 mg | ORAL_TABLET | Freq: Every day | ORAL | 0 refills | Status: DC
  Filled 2021-04-18: qty 30, 30d supply, fill #0

## 2021-04-18 NOTE — Addendum Note (Signed)
Addended by: Gwenevere Abbot on: 04/18/2021 02:51 PM   Modules accepted: Orders

## 2021-04-19 ENCOUNTER — Telehealth (HOSPITAL_BASED_OUTPATIENT_CLINIC_OR_DEPARTMENT_OTHER): Payer: Self-pay

## 2021-04-21 ENCOUNTER — Other Ambulatory Visit (HOSPITAL_BASED_OUTPATIENT_CLINIC_OR_DEPARTMENT_OTHER): Payer: Self-pay

## 2021-04-21 ENCOUNTER — Other Ambulatory Visit: Payer: Self-pay | Admitting: Medical

## 2021-04-21 MED ORDER — ALPRAZOLAM 0.5 MG PO TABS
0.5000 mg | ORAL_TABLET | Freq: Two times a day (BID) | ORAL | 0 refills | Status: DC | PRN
  Filled 2021-04-21: qty 60, 30d supply, fill #0

## 2021-04-21 NOTE — Telephone Encounter (Addendum)
Requesting: xanax ?Contract:04/12/21 ?UDS:03/23/21 ?Last Visit:04/05/21 ?Next Visit:05/03/21 ?Last Refill:03/23/21 ? ?Please Advise  ? ?Rx refill send to pharmacy. ? ?Esperanza Richters, PA-C  ?

## 2021-04-29 ENCOUNTER — Telehealth (HOSPITAL_BASED_OUTPATIENT_CLINIC_OR_DEPARTMENT_OTHER): Payer: Self-pay

## 2021-05-03 ENCOUNTER — Ambulatory Visit (INDEPENDENT_AMBULATORY_CARE_PROVIDER_SITE_OTHER): Payer: Medicare Other

## 2021-05-03 DIAGNOSIS — I10 Essential (primary) hypertension: Secondary | ICD-10-CM

## 2021-05-03 DIAGNOSIS — Z7901 Long term (current) use of anticoagulants: Secondary | ICD-10-CM

## 2021-05-03 NOTE — Progress Notes (Addendum)
?  Pt here for INR check per Esperanza Richters, PA-C ?  ?Goal INR : 2-3 ?  ?Last INR :  ?Recent Labs  ?     ?Lab Results  ?Component Value Date  ?  INR 3.1 (A) 03/28/2021  ?  INR 5.3 (H) 03/23/2021  ?  INR 2.5 (H) 03/03/2020  ?  ?  ?INR today : 2.5 ?  ?Pt currently takes Coumadin.  ?Currently taking 5MG  on Sunday and 2.5 all other days.  ?  ?Pt denies recent antibiotics, no dietary changes and no unusual bruising / bleeding. ?  ?Pt advised per Wednesday to continue current dosing schedule and follow up in 1 month. ?  ?Pt scheduled for 05/31/21 ? ? ?06/02/21, PA-C  ?  ?

## 2021-05-20 ENCOUNTER — Other Ambulatory Visit: Payer: Self-pay | Admitting: Medical

## 2021-05-20 ENCOUNTER — Other Ambulatory Visit (HOSPITAL_BASED_OUTPATIENT_CLINIC_OR_DEPARTMENT_OTHER): Payer: Self-pay

## 2021-05-20 ENCOUNTER — Other Ambulatory Visit: Payer: Self-pay | Admitting: Hematology & Oncology

## 2021-05-20 DIAGNOSIS — E7211 Homocystinuria: Secondary | ICD-10-CM

## 2021-05-20 MED ORDER — AMLODIPINE BESYLATE 2.5 MG PO TABS
2.5000 mg | ORAL_TABLET | Freq: Every day | ORAL | 1 refills | Status: DC
  Filled 2021-05-20: qty 90, 90d supply, fill #0
  Filled 2021-06-22 – 2021-07-19 (×2): qty 90, 90d supply, fill #1

## 2021-05-20 NOTE — Telephone Encounter (Addendum)
Requesting: xanax 0.5MG  ?Contract: 04/12/21 ?UDS: 03/23/21 ?Last Visit: 04/05/21 ?Next Visit: 05/31/21 ?Last Refill: 04/21/21 ? ?Please Advise  ? ?Rx refill sent to pharmacy. ? ?Esperanza Richters, PA-C  ?

## 2021-05-22 MED ORDER — ALPRAZOLAM 0.5 MG PO TABS
0.5000 mg | ORAL_TABLET | Freq: Two times a day (BID) | ORAL | 0 refills | Status: DC | PRN
Start: 2021-05-22 — End: 2021-06-22
  Filled 2021-05-22: qty 60, 30d supply, fill #0

## 2021-05-23 ENCOUNTER — Other Ambulatory Visit (HOSPITAL_BASED_OUTPATIENT_CLINIC_OR_DEPARTMENT_OTHER): Payer: Self-pay

## 2021-05-31 ENCOUNTER — Ambulatory Visit (INDEPENDENT_AMBULATORY_CARE_PROVIDER_SITE_OTHER): Payer: Medicare Other

## 2021-05-31 DIAGNOSIS — Z7901 Long term (current) use of anticoagulants: Secondary | ICD-10-CM | POA: Diagnosis not present

## 2021-05-31 LAB — POCT INR

## 2021-05-31 NOTE — Progress Notes (Addendum)
Pt here for INR check per Esperanza Richters, PA-C ? ?Goal INR : 2-3 ? ?Last INR : 2.5 ?Recent Labs  ? ?Lab Results  ?Component Value Date  ?INR 3.1 (A) 03/28/2021  ?INR 5.3 (H) 03/23/2021  ?INR 2.5 (H) 03/03/2020  ? ? ?INR today : 2.4 ? ?Pt currently takes Coumadin.  ?Currently taking 5MG  on Sunday and 2.5 all other days ? ?Pt Advised and scheduled for follow Up one month office visit with Sunday PA-C ? ?Esperanza Richters, PA-C  ? ? ? ? ?

## 2021-06-22 ENCOUNTER — Other Ambulatory Visit: Payer: Self-pay | Admitting: Medical

## 2021-06-22 ENCOUNTER — Other Ambulatory Visit (HOSPITAL_BASED_OUTPATIENT_CLINIC_OR_DEPARTMENT_OTHER): Payer: Self-pay

## 2021-06-22 MED ORDER — GABAPENTIN 300 MG PO CAPS
ORAL_CAPSULE | Freq: Every day | ORAL | 2 refills | Status: DC
  Filled 2021-06-22: qty 30, 30d supply, fill #0
  Filled 2021-07-19: qty 30, 30d supply, fill #1
  Filled 2021-08-19: qty 30, 30d supply, fill #2

## 2021-06-22 MED ORDER — ALPRAZOLAM 0.5 MG PO TABS
0.5000 mg | ORAL_TABLET | Freq: Two times a day (BID) | ORAL | 0 refills | Status: DC | PRN
  Filled 2021-06-22: qty 60, 30d supply, fill #0

## 2021-06-22 MED ORDER — BENAZEPRIL HCL 20 MG PO TABS
ORAL_TABLET | Freq: Two times a day (BID) | ORAL | 2 refills | Status: DC
  Filled 2021-06-22: qty 60, 30d supply, fill #0
  Filled 2021-07-19: qty 60, 30d supply, fill #1
  Filled 2021-08-19: qty 60, 30d supply, fill #2

## 2021-06-22 NOTE — Telephone Encounter (Addendum)
Requesting: xanax ?Contract:03/23/21 ?UDS:03/23/21 ?Last Visit:04/05/21 ?Next Visit:06/28/21 ?Last Refill:05/22/21 ? ?Please Advise  ? ?Rx sent to pt pharmacy. ? ?Mackie Pai, PA-C  ?

## 2021-06-23 ENCOUNTER — Other Ambulatory Visit (HOSPITAL_BASED_OUTPATIENT_CLINIC_OR_DEPARTMENT_OTHER): Payer: Self-pay

## 2021-06-28 ENCOUNTER — Encounter: Payer: Self-pay | Admitting: Medical

## 2021-06-28 ENCOUNTER — Ambulatory Visit (INDEPENDENT_AMBULATORY_CARE_PROVIDER_SITE_OTHER): Payer: Medicare Other | Admitting: Medical

## 2021-06-28 ENCOUNTER — Other Ambulatory Visit (HOSPITAL_BASED_OUTPATIENT_CLINIC_OR_DEPARTMENT_OTHER): Payer: Self-pay

## 2021-06-28 VITALS — BP 138/70 | HR 59 | Resp 16 | Ht 69.0 in

## 2021-06-28 DIAGNOSIS — Z86711 Personal history of pulmonary embolism: Secondary | ICD-10-CM | POA: Diagnosis not present

## 2021-06-28 DIAGNOSIS — Z7901 Long term (current) use of anticoagulants: Secondary | ICD-10-CM

## 2021-06-28 DIAGNOSIS — E790 Hyperuricemia without signs of inflammatory arthritis and tophaceous disease: Secondary | ICD-10-CM

## 2021-06-28 DIAGNOSIS — I82433 Acute embolism and thrombosis of popliteal vein, bilateral: Secondary | ICD-10-CM

## 2021-06-28 DIAGNOSIS — M2550 Pain in unspecified joint: Secondary | ICD-10-CM | POA: Diagnosis not present

## 2021-06-28 DIAGNOSIS — M0580 Other rheumatoid arthritis with rheumatoid factor of unspecified site: Secondary | ICD-10-CM

## 2021-06-28 LAB — SEDIMENTATION RATE: Sed Rate: 33 mm/h — ABNORMAL HIGH (ref 0–30)

## 2021-06-28 LAB — URIC ACID: Uric Acid, Serum: 8.3 mg/dL — ABNORMAL HIGH (ref 2.4–7.0)

## 2021-06-28 LAB — C-REACTIVE PROTEIN: CRP: <1 mg/dL (ref 0.5–20.0)

## 2021-06-28 MED ORDER — PREDNISONE 10 MG PO TABS
ORAL_TABLET | ORAL | 0 refills | Status: DC
  Filled 2021-06-28: qty 21, 6d supply, fill #0

## 2021-06-28 NOTE — Addendum Note (Signed)
Addended by: Manuela Schwartz on: 06/28/2021 05:05 PM   Modules accepted: Orders

## 2021-06-28 NOTE — Progress Notes (Signed)
Subjective:    Patient ID: Rita Watkins, female    DOB: [birth year], 66 y.o.   MRN: [id number]  HPI  Pt in for some recent joint pains. She states when wakes in morning her rt second digit is swollen thick and painful to movement. Also left shoulder and rt ankle pain. Pt tried advil and tylenol and still has moderate to severe pain 7-8/10.   Pt mom had RA.  Left shoulder pain for about 2 months. Left 2nd hand digit 3-4 week. Rt ankle pain chronic for years.  On review pt not diabetic.  Hx of dvt, pe and currently on coumadin. Monday- Saturday 2.5 mg. Sunday 5 mg.   Htn- on amlodipine 2.5 mg daily and benazepril 20 mg daily bid. Today 138/70. Pt at home reading range 286-381 systolic/7-78.  Most of time  bp in 771 range systolic.    Review of Systems  Constitutional:  Negative for chills, fatigue and fever.  Respiratory:  Negative for cough, chest tightness, shortness of breath and wheezing.   Cardiovascular:  Negative for chest pain and palpitations.  Gastrointestinal:  Negative for abdominal distention, abdominal pain, blood in stool and constipation.  Genitourinary:  Negative for dysuria.  Musculoskeletal:  Positive for arthralgias. Negative for back pain and joint swelling.  Neurological:  Negative for dizziness, light-headedness and headaches.  Hematological:  Negative for adenopathy. Does not bruise/bleed easily.  Psychiatric/Behavioral:  Negative for behavioral problems and confusion.     Past Medical History:  Diagnosis Date   Allergy    Anemia associated with acute blood loss 08/22/2016   Formatting of this note is different from the original.  CT abdomen 7/17:   1.  Large left retroperitoneal hematoma with mixed density blood products.  Assessment for active bleeding is not possible without IV contrast. 2.  Contrast in the renal collecting systems presumably from contrast enhanced studies performed on August 20, 2016 consistent with known impaired renal function.   CT  abdomen 7/27:    Anxiety    Benign essential HTN 08/14/2016   Cardiac murmur 04/20/2020   Cigarette smoker 07/15/2014   COPD (chronic obstructive pulmonary disease) (HCC)    Deep vein thrombosis (DVT) of popliteal vein of both lower extremities ([place]) 08/18/2016   Last Assessment & Plan:  Formatting of this note might be different from the original. Not clear if provoked/unprovoked (pt had trauma to left toe back in 03/2016 with some decreased mobility thereafter) CT chest confirmed acute PE and also concerning for some bilateral upper lobe groundglass opacities Hematology on board and heparin drip started but  discontinued 7/17  2/2 large retroperitoneal he   Depression    Depression with anxiety 09/13/2012   Dizziness 07/19/2016   Essential hypertension 04/20/2020   History of pulmonary embolism 04/20/2020   Hyperlipidemia    Hypertension    Leukocytosis 07/30/2014   Lymphangitis 06/07/2016   Mycobacterial infection 06/07/2016   Mycobacterium infection, 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 Per dermatology: Please provide the patient with our outpatient clinic phone number [phone]) to call and schedule a follow up appointment for continued management.   Last Assessment & Plan:  Formatting of this note might be different from the original. Uncertain etiology Vasculitic vs infectious vs malignancy Extensive work up has   Obesity 09/20/2016   Last Assessment & Plan:  Formatting of this note might  be different from the original. Body mass index is 22.67 kg/m.  - For weight loss counseling.   Obesity (BMI 30.0-34.9) 04/20/2020   Open leg wound, left, sequela 08/17/2016   Formatting of this note might be different from the original. - Chronic left wound starting in April 2018 and worsening despite antibiotics and debridement -Patient had multiple  hospitalizations and was treated with steroids and antibiotics with worsening/extension of the wound - ESR 103, CRP 168 - ANA, factor V Leyden, Antithrombin III, Cardiolite and antibody IgG, C3 and C4 complement proteinase   Peripheral vascular disease ([place]) 08/19/2016   Last Assessment & Plan:  Formatting of this note might be different from the original. 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  ASA 81 mg daily PT consult   Pulmonary embolism without acute cor pulmonale (HCC) 08/18/2016   Last Assessment & Plan:  Formatting of this note might be different from the original. 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 Added automatically from request for surgery [id number]  Last Assessment & Plan:  Formatting of this note might be different from the original. Presented with large area of necrotic tissue LLE Bilateral popliteal DVT Etiology uncertain:  Ischemic vs vasculitis vs autoimmune vs possible occult malignancy Bx: leukocytoblastic vasculopathy  In   Tobacco use 08/18/2016   Last Assessment & Plan:  Formatting of this note might be different from the original. Patient has a 30+ pack year history of smoking Discussed low dose CT chest for cancer screening at admission, patient open to this but concerned about extra cost as she has no insurance at this time. No obvious suspicious lesions noted on CTA PE study.   Counseled regarding tobacco cessation     Social History   Socioeconomic History   Marital status: Widowed    Spouse name: Not on file   Number of children: Not on file   Years of education: high schoo   Highest education level: Not on file  Occupational History   Occupation: ADM. ASSISTANT    Employer: [employer]  Tobacco Use   Smoking status: Former    Packs/day: 1.00    Years: 35.00    Pack years: 35.00    Types: Cigarettes    Quit date: 09/14/2016    Years since quitting: 4.7   Smokeless tobacco: Never  Substance and Sexual Activity   Alcohol use: No    Alcohol/week: 0.0 standard drinks   Drug use: No   Sexual activity: Never  Other Topics Concern   Not on file  Social History Narrative   Does not exercise.   Social Determinants of Health   Financial Resource Strain: Low Risk    Difficulty of Paying Living Expenses: Not hard at all  Food Insecurity: No Food Insecurity   Worried About RCharity fundraiserin the Last Year: Never true   RMytonin the Last Year: Never  true  Transportation Needs: No Transportation Needs   Lack of Transportation (Medical): No   Lack of Transportation (Non-Medical): No  Physical Activity: Insufficiently Active   Days of Exercise per Week: 7 days   Minutes of Exercise per Session: 20 min  Stress: No Stress Concern Present   Feeling of Stress : Not at all  Social Connections: Moderately Isolated   Frequency of Communication with Friends and Family: More than three times a week   Frequency of Social Gatherings with Friends and Family: More than three times a week   Attends Religious Services: More than 4 times per year   Active Member of [employer] or Organizations: No   Attends Archivist Meetings: Never   Marital Status: Widowed  Human resources officer Violence: Not At Risk   Fear of Current or Ex-Partner: No   Emotionally Abused: No   Physically Abused: No   Sexually Abused: No    Past Surgical History:  Procedure  Laterality Date   FOOT SURGERY     Left   HIP PINNING     3 each hip   IRRIGATION AND DEBRIDEMENT ABSCESS N/A 06/27/2016   Procedure: IRRIGATION AND DEBRIDEMENT LEFT LEG WITH SOTFT TISSUE BIOPSY;  Surgeon: Clovis Riley, MD;  Location: [facility];  Service: General;  Laterality: N/A;   LYMPH NODE BIOPSY Left 06/09/2016   Procedure: BIOPSY LEFT LEG;  Surgeon: Wallace Going, DO;  Location: [facility];  Service: Plastics;  Laterality: Left;   SLIPPED CAPITAL FEMORAL EPIPHYSIS PINNING      Family History  Problem Relation Age of Onset   Hypertension Mother    Hyperlipidemia Mother    Hypertension Brother    Asthma Brother     Allergies  Allergen Reactions   Avelox [Moxifloxacin Hcl In Nacl] Anaphylaxis   Montelukast Sodium Other (See Comments)    Insomnia   Pravastatin Other (See Comments)    Myalgias on 23m    Current Outpatient Medications on File Prior to Visit  Medication Sig Dispense Refill   ALPRAZolam (XANAX) 0.5 MG tablet Take 1 tablet (0.5 mg total) by mouth 2 (two) times daily as needed for anxiety. 60 tablet 0   amLODipine (NORVASC) 2.5 MG tablet Take 1 tablet (2.5 mg total) by mouth daily. 90 tablet 1   benazepril (LOTENSIN) 20 MG tablet TAKE 1 TABLET (20 MG TOTAL) BY MOUTH 2 (TWO) TIMES DAILY. 60 tablet 2   COVID-19 mRNA bivalent vaccine, Pfizer, injection Inject into the muscle. 0.3 mL 0   folic acid (FOLVITE) 1 MG tablet Take 1 tablet (1 mg total) by mouth daily. 30 tablet 3   gabapentin (NEURONTIN) 300 MG capsule TAKE 1 CAPSULE (300 MG TOTAL) BY MOUTH AT BEDTIME. 30 capsule 2   influenza vaccine adjuvanted (FLUAD) 0.5 ML injection Inject into the muscle. 0.5 mL 0   warfarin (COUMADIN) 5 MG tablet TAKE 1 TABLET BY MOUTH ONCE DAILY 30 tablet 5   cephALEXin (KEFLEX) 500 MG capsule Take 1 capsule (500 mg total) by mouth 2 (two) times daily. 20 capsule 0   [DISCONTINUED] DULoxetine (CYMBALTA) 30 MG capsule Take 1 capsule (30 mg total) by mouth daily. 30 capsule 0    No current facility-administered medications on file prior to visit.    BP (!) 150/70   Pulse (!) 59   Resp 16   Ht 5' 9"  (1.753 m)   SpO2 98%   BMI 35.44 kg/m       Objective:   Physical Exam  General- No acute distress. Pleasant patient. Neck- Full range of motion, no jvd Lungs- Clear, even and unlabored. Heart- regular rate and rhythm. Neurologic- CNII- XII grossly intact.  Left shouder- pain on palpation and rom. Rt hand- 2nd digit mild swollen, faint warm and tender. Rt ankle- mild tender to palpation. Not warm.       Assessment & Plan:   Patient Instructions  Various arthralgia with fh of RA in mom. Will get inflammatory lab studies and rx taper prednisone pending lab results. I am leaning toward referring you to rheumatolgist but will wait until lab results.  Hx of dvt, PE and on coumadin. Get inr today and make adjustments if needed.  Htn- bp decently controlled today. Continue current amlodipine 2.5 mg daily and benazepril 20 mg daily bid. Ask that you check bp daily and send me update on readings in one week.  Follow up in one month or sooner if needed.   Mackie Pai, PA-C

## 2021-06-28 NOTE — Addendum Note (Signed)
Addended by: Manuela Schwartz on: 06/28/2021 12:02 PM   Modules accepted: Orders

## 2021-06-28 NOTE — Patient Instructions (Addendum)
Various arthralgia with fh of RA in mom. Will get inflammatory lab studies and rx taper prednisone pending lab results. I am leaning toward referring you to rheumatolgist but will wait until lab results.  Hx of dvt, PE and on coumadin. Get inr today and make adjustments if needed.  Htn- bp decently controlled today. Continue current amlodipine 2.5 mg daily and benazepril 20 mg daily bid. Ask that you check bp daily and send me update on readings in one week.  Follow up in one month or sooner if needed.  Uric acid elevated so please review this information.  Gout  Gout is a condition that causes painful swelling of the joints. Gout is a type of inflammation of the joints (arthritis). This condition is caused by having too much uric acid in the body. Uric acid is a chemical that forms when the body breaks down substances called purines. Purines are important for building body proteins. When the body has too much uric acid, sharp crystals can form and build up inside the joints. This causes pain and swelling. Gout attacks can happen quickly and may be very painful (acute gout). Over time, the attacks can affect more joints and become more frequent (chronic gout). Gout can also cause uric acid to build up under the skin and inside the kidneys. What are the causes? This condition is caused by too much uric acid in your blood. This can happen because: Your kidneys do not remove enough uric acid from your blood. This is the most common cause. Your body makes too much uric acid. This can happen with some cancers and cancer treatments. It can also occur if your body is breaking down too many red blood cells (hemolytic anemia). You eat too many foods that are high in purines. These foods include organ meats and some seafood. Alcohol, especially beer, is also high in purines. A gout attack may be triggered by trauma or stress. What increases the risk? The following factors may make you more likely to  develop this condition: Having a family history of gout. Being female and middle-aged. Being female and having gone through menopause. Taking certain medicines, including aspirin, cyclosporine, diuretics, levodopa, and niacin. Having an organ transplant. Having certain conditions, such as: Being obese. Lead poisoning. Kidney disease. A skin condition called psoriasis. Other factors include: Losing weight too quickly. Being dehydrated. Frequently drinking alcohol, especially beer. Frequently drinking beverages that are sweetened with a type of sugar called fructose. What are the signs or symptoms? An attack of acute gout happens quickly. It usually occurs in just one joint. The most common place is the big toe. Attacks often start at night. Other joints that may be affected include joints of the feet, ankle, knee, fingers, wrist, or elbow. Symptoms of this condition may include: Severe pain. Warmth. Swelling. Stiffness. Tenderness. The affected joint may be very painful to touch. Shiny, red, or purple skin. Chills and fever. Chronic gout may cause symptoms more frequently. More joints may be involved. You may also have white or yellow lumps (tophi) on your hands or feet or in other areas near your joints. How is this diagnosed? This condition is diagnosed based on your symptoms, your medical history, and a physical exam. You may have tests, such as: Blood tests to measure uric acid levels. Removal of joint fluid with a thin needle (aspiration) to look for uric acid crystals. X-rays to look for joint damage. How is this treated? Treatment for this condition has two phases: treating  an acute attack and preventing future attacks. Acute gout treatment may include medicines to reduce pain and swelling, including: NSAIDs, such as ibuprofen. Steroids. These are strong anti-inflammatory medicines that can be taken by mouth (orally) or injected into a joint. Colchicine. This medicine relieves  pain and swelling when it is taken soon after an attack. It can be given by mouth or through an IV. Preventive treatment may include: Daily use of smaller doses of NSAIDs or colchicine. Use of a medicine that reduces uric acid levels in your blood, such as allopurinol. Changes to your diet. You may need to see a dietitian about what to eat and drink to prevent gout. Follow these instructions at home: During a gout attack  If directed, put ice on the affected area. To do this: Put ice in a plastic bag. Place a towel between your skin and the bag. Leave the ice on for 20 minutes, 2-3 times a day. Remove the ice if your skin turns bright red. This is very important. If you cannot feel pain, heat, or cold, you have a greater risk of damage to the area. Raise (elevate) the affected joint above the level of your heart as often as possible. Rest the joint as much as possible. If the affected joint is in your leg, you may be given crutches to use. Follow instructions from your health care provider about eating or drinking restrictions. Avoiding future gout attacks Follow a low-purine diet as told by your dietitian or health care provider. Avoid foods and drinks that are high in purines, including liver, kidney, anchovies, asparagus, herring, mushrooms, mussels, and beer. Maintain a healthy weight or lose weight if you are overweight. If you want to lose weight, talk with your health care provider. Do not lose weight too quickly. Start or maintain an exercise program as told by your health care provider. Eating and drinking Avoid drinking beverages that contain fructose. Drink enough fluids to keep your urine pale yellow. If you drink alcohol: Limit how much you have to: 0-1 drink a day for women who are not pregnant. 0-2 drinks a day for men. Know how much alcohol is in a drink. In the [place] one drink equals one 12 oz bottle of beer (355 mL), one 5 oz glass of wine (148 mL), or one 1 oz glass of  hard liquor (44 mL). General instructions Take over-the-counter and prescription medicines only as told by your health care provider. Ask your health care provider if the medicine prescribed to you requires you to avoid driving or using machinery. Return to your normal activities as told by your health care provider. Ask your health care provider what activities are safe for you. Keep all follow-up visits. This is important. Where to find more information [employer]: www.niams.http://www.myers.net/ Contact a health care provider if you have: Another gout attack. Continuing symptoms of a gout attack after 10 days of treatment. Side effects from your medicines. Chills or a fever. Burning pain when you urinate. Pain in your lower back or abdomen. Get help right away if you: Have severe or uncontrolled pain. Cannot urinate. Summary Gout is painful swelling of the joints caused by having too much uric acid in the body. The most common site for gout to occur is in the big toe, but it can affect other joints in the body. Medicines and dietary changes can help to prevent and treat gout attacks. This information is not intended to replace advice given to you by your  health care provider. Make sure you discuss any questions you have with your health care provider. Document Revised: 10/27/2020 Document Reviewed: 10/27/2020 Elsevier Patient Education  2023 [employer].

## 2021-06-29 ENCOUNTER — Other Ambulatory Visit (INDEPENDENT_AMBULATORY_CARE_PROVIDER_SITE_OTHER): Payer: Medicare Other

## 2021-06-29 ENCOUNTER — Encounter: Payer: Self-pay | Admitting: Medical

## 2021-06-29 DIAGNOSIS — Z7901 Long term (current) use of anticoagulants: Secondary | ICD-10-CM | POA: Diagnosis not present

## 2021-06-29 DIAGNOSIS — Z86711 Personal history of pulmonary embolism: Secondary | ICD-10-CM

## 2021-06-29 DIAGNOSIS — I82433 Acute embolism and thrombosis of popliteal vein, bilateral: Secondary | ICD-10-CM

## 2021-06-29 LAB — PROTIME-INR
INR: 1.4 ratio — ABNORMAL HIGH (ref 0.8–1.0)
Prothrombin Time: 15.5 s — ABNORMAL HIGH (ref 9.6–13.1)

## 2021-06-29 NOTE — Addendum Note (Signed)
Addended by: Gwenevere Abbot on: 06/29/2021 08:16 PM   Modules accepted: Orders

## 2021-06-30 LAB — RHEUMATOID FACTOR: Rheumatoid fact SerPl-aCnc: 40 IU/mL — ABNORMAL HIGH (ref <14)

## 2021-06-30 LAB — ANA: Anti Nuclear Antibody (ANA): POSITIVE — AB

## 2021-06-30 LAB — ANTI-NUCLEAR AB-TITER (ANA TITER): ANA Titer 1: 1:80 {titer} — ABNORMAL HIGH

## 2021-07-05 ENCOUNTER — Encounter: Payer: Self-pay | Admitting: Medical

## 2021-07-06 ENCOUNTER — Ambulatory Visit (INDEPENDENT_AMBULATORY_CARE_PROVIDER_SITE_OTHER): Payer: Medicare Other

## 2021-07-06 ENCOUNTER — Ambulatory Visit: Payer: Medicare Other

## 2021-07-06 DIAGNOSIS — Z7901 Long term (current) use of anticoagulants: Secondary | ICD-10-CM

## 2021-07-06 LAB — POCT INR: INR: 1.8 — AB (ref 2.0–3.0)

## 2021-07-06 NOTE — Progress Notes (Addendum)
Pt here for INR check per Saguier  Goal INR = 2-3  Last INR = 1.4  Pt currently takes Coumadin   Currently taking 5MG  on Wed, Thurs, Friday, Sat, and Sun, 1/2 tabs starting on Monday- Wednesday  Pt denies recent antibiotics, no dietary changes and no unusual bruising / bleeding.  INR today = 1.8  Pt advised per Saguier to take 5 MG everyday except Wednesday. Take 1/2 tablet. Return in 1 week  Sunday, Esperanza Richters

## 2021-07-13 ENCOUNTER — Ambulatory Visit (INDEPENDENT_AMBULATORY_CARE_PROVIDER_SITE_OTHER): Payer: Medicare Other | Admitting: Medical

## 2021-07-13 ENCOUNTER — Ambulatory Visit: Payer: Medicare Other

## 2021-07-13 ENCOUNTER — Other Ambulatory Visit (HOSPITAL_BASED_OUTPATIENT_CLINIC_OR_DEPARTMENT_OTHER): Payer: Self-pay

## 2021-07-13 VITALS — BP 130/70 | HR 61 | Resp 18 | Ht 69.0 in

## 2021-07-13 DIAGNOSIS — H6981 Other specified disorders of Eustachian tube, right ear: Secondary | ICD-10-CM

## 2021-07-13 DIAGNOSIS — R059 Cough, unspecified: Secondary | ICD-10-CM | POA: Diagnosis not present

## 2021-07-13 DIAGNOSIS — R0981 Nasal congestion: Secondary | ICD-10-CM | POA: Diagnosis not present

## 2021-07-13 DIAGNOSIS — J0100 Acute maxillary sinusitis, unspecified: Secondary | ICD-10-CM | POA: Diagnosis not present

## 2021-07-13 DIAGNOSIS — M2550 Pain in unspecified joint: Secondary | ICD-10-CM

## 2021-07-13 DIAGNOSIS — Z7901 Long term (current) use of anticoagulants: Secondary | ICD-10-CM

## 2021-07-13 LAB — POCT INR: INR: 2.8 (ref 2.0–3.0)

## 2021-07-13 MED ORDER — FLUTICASONE PROPIONATE 50 MCG/ACT NA SUSP
2.0000 | Freq: Every day | NASAL | 1 refills | Status: DC
  Filled 2021-07-13: qty 16, 30d supply, fill #0
  Filled 2021-08-19: qty 16, 30d supply, fill #1

## 2021-07-13 MED ORDER — CEPHALEXIN 500 MG PO CAPS
500.0000 mg | ORAL_CAPSULE | Freq: Two times a day (BID) | ORAL | 0 refills | Status: DC
  Filled 2021-07-13: qty 20, 10d supply, fill #0

## 2021-07-13 NOTE — Progress Notes (Signed)
Subjective:    Patient ID: Rita Watkins, female    DOB: [birth year], 66 y.o.   MRN: [id number]  HPI  Pt in for follow up.  Pt last inr was low. We made adjustment from last week and pt inr is now controlled. Last adjustment was every day 5 mg and 1/2 tab today. Wed 1/2 tab and other days full tab.   Recently her rt ear is plugged sensation. Sometimes when lies down thinks can hear her heart beat. This coincided with rt side maxillary sinus pressure and congestion since Friday.  Mild cough recently. At times will feel pnd.   Arthralgia better after prednisone use. Appointment with rheumatologist in 3 months.   Review of Systems  Constitutional:  Negative for chills and fatigue.  HENT:  Positive for congestion, postnasal drip, sinus pressure and sinus pain. Negative for ear pain and nosebleeds.   Respiratory:  Positive for cough. Negative for chest tightness, shortness of breath and wheezing.   Cardiovascular:  Negative for chest pain and palpitations.  Gastrointestinal:  Negative for abdominal pain, constipation, diarrhea and nausea.  Genitourinary:  Negative for dyspareunia and dysuria.  Musculoskeletal:  Negative for back pain.  Skin:  Negative for rash.   Past Medical History:  Diagnosis Date   Allergy    Anemia associated with acute blood loss 08/22/2016   Formatting of this note is different from the original.  CT abdomen 7/17:   1.  Large left retroperitoneal hematoma with mixed density blood products.  Assessment for active bleeding is not possible without IV contrast. 2.  Contrast in the renal collecting systems presumably from contrast enhanced studies performed on August 20, 2016 consistent with known impaired renal function.   CT abdomen 7/27:    Anxiety    Benign essential HTN 08/14/2016   Cardiac murmur 04/20/2020   Cigarette smoker 07/15/2014   COPD (chronic obstructive pulmonary disease) (HCC)    Deep vein thrombosis (DVT) of popliteal vein of both lower  extremities ([place]) 08/18/2016   Last Assessment & Plan:  Formatting of this note might be different from the original. Not clear if provoked/unprovoked (pt had trauma to left toe back in 03/2016 with some decreased mobility thereafter) CT chest confirmed acute PE and also concerning for some bilateral upper lobe groundglass opacities Hematology on board and heparin drip started but  discontinued 7/17  2/2 large retroperitoneal he   Depression    Depression with anxiety 09/13/2012   Dizziness 07/19/2016   Essential hypertension 04/20/2020   History of pulmonary embolism 04/20/2020   Hyperlipidemia    Hypertension    Leukocytosis 07/30/2014   Lymphangitis 06/07/2016   Mycobacterial infection 06/07/2016   Mycobacterium infection, 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 Per dermatology: Please provide the patient with our outpatient clinic phone number [phone]) to call and schedule a follow up appointment for continued management.   Last Assessment & Plan:  Formatting of this note might be different from the original. Uncertain etiology Vasculitic vs infectious vs malignancy Extensive work up has   Obesity 09/20/2016   Last Assessment & Plan:  Formatting of this note might be different from the original. Body mass index is 22.67 kg/m.  - For weight loss counseling.   Obesity (BMI 30.0-34.9) 04/20/2020   Open leg wound, left, sequela 08/17/2016   Formatting of this note might be different from  the original. - Chronic left wound starting in April 2018 and worsening despite antibiotics and debridement -Patient had multiple hospitalizations and was treated with steroids and antibiotics with worsening/extension of the wound - ESR 103, CRP 168 - ANA, factor V Ley[place], Antithrombin III, Cardiolite and antibody IgG, C3 and C4 complement proteinase   Peripheral vascular  disease ([place]) 08/19/2016   Last Assessment & Plan:  Formatting of this note might be different from the original. 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  ASA 81 mg daily PT consult   Pulmonary embolism without acute cor pulmonale (HCC) 08/18/2016   Last Assessment & Plan:  Formatting of this note might be different from the original. 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 Added automatically from request for surgery [id number]  Last Assessment & Plan:  Formatting of this note might be different from the original. Presented with large area of necrotic tissue LLE Bilateral popliteal DVT Etiology uncertain:  Ischemic vs vasculitis vs autoimmune vs possible occult malignancy Bx: leukocytoblastic vasculopathy  In   Tobacco use 08/18/2016   Last Assessment & Plan:  Formatting of this note might be different from the original. Patient has a 30+ pack year history of smoking Discussed low dose CT chest for cancer screening at admission, patient open to this but concerned about extra cost as she has no insurance at this time. No obvious suspicious lesions noted on CTA PE study.  Counseled regarding tobacco cessation     Social History   Socioeconomic History   Marital status: Widowed    Spouse name: Not on file   Number of children: Not on file   Years of education: high schoo   Highest education level: Not on  file  Occupational History   Occupation: ADM. ASSISTANT    Employer: [employer]  Tobacco Use   Smoking status: Former    Packs/day: 1.00    Years: 35.00    Pack years: 35.00    Types: Cigarettes    Quit date: 09/14/2016    Years since quitting: 4.8   Smokeless tobacco: Never  Substance and Sexual Activity   Alcohol use: No    Alcohol/week: 0.0 standard drinks   Drug use: No   Sexual activity: Never  Other Topics Concern   Not on file  Social History Narrative   Does not exercise.   Social Determinants of Health   Financial Resource Strain: Low Risk    Difficulty of Paying Living Expenses: Not hard at all  Food Insecurity: No Food Insecurity   Worried About RCharity fundraiserin the Last Year: Never true   [place] the Last Year: Never true  Transportation Needs: No Transportation Needs   Lack of Transportation (Medical): No   Lack of Transportation (Non-Medical): No  Physical Activity: Insufficiently Active   Days of Exercise per Week: 7 days   Minutes of Exercise per  Session: 20 min  Stress: No Stress Concern Present   Feeling of Stress : Not at all  Social Connections: Moderately Isolated   Frequency of Communication with Friends and Family: More than three times a week   Frequency of Social Gatherings with Friends and Family: More than three times a week   Attends Religious Services: More than 4 times per year   Active Member of [employer] or Organizations: No   Attends Archivist Meetings: Never   Marital Status: Widowed  Human resources officer Violence: Not At Risk   Fear of Current or Ex-Partner: No   Emotionally Abused: No   Physically Abused: No   Sexually Abused: No    Past Surgical History:  Procedure Laterality Date   FOOT SURGERY     Left   HIP PINNING     3 each hip   IRRIGATION AND DEBRIDEMENT ABSCESS N/A 06/27/2016   Procedure: IRRIGATION AND DEBRIDEMENT LEFT LEG WITH SOTFT TISSUE BIOPSY;  Surgeon: Clovis Riley, MD;  Location: [facility];   Service: General;  Laterality: N/A;   LYMPH NODE BIOPSY Left 06/09/2016   Procedure: BIOPSY LEFT LEG;  Surgeon: Wallace Going, DO;  Location: [facility];  Service: Plastics;  Laterality: Left;   SLIPPED CAPITAL FEMORAL EPIPHYSIS PINNING      Family History  Problem Relation Age of Onset   Hypertension Mother    Hyperlipidemia Mother    Hypertension Brother    Asthma Brother     Allergies  Allergen Reactions   Avelox [Moxifloxacin Hcl In Nacl] Anaphylaxis   Montelukast Sodium Other (See Comments)    Insomnia   Pravastatin Other (See Comments)    Myalgias on 28m    Current Outpatient Medications on File Prior to Visit  Medication Sig Dispense Refill   ALPRAZolam (XANAX) 0.5 MG tablet Take 1 tablet (0.5 mg total) by mouth 2 (two) times daily as needed for anxiety. 60 tablet 0   amLODipine (NORVASC) 2.5 MG tablet Take 1 tablet (2.5 mg total) by mouth daily. 90 tablet 1   benazepril (LOTENSIN) 20 MG tablet TAKE 1 TABLET (20 MG TOTAL) BY MOUTH 2 (TWO) TIMES DAILY. 60 tablet 2   COVID-19 mRNA bivalent vaccine, Pfizer, injection Inject into the muscle. 0.3 mL 0   folic acid (FOLVITE) 1 MG tablet Take 1 tablet (1 mg total) by mouth daily. 30 tablet 3   gabapentin (NEURONTIN) 300 MG capsule TAKE 1 CAPSULE (300 MG TOTAL) BY MOUTH AT BEDTIME. 30 capsule 2   influenza vaccine adjuvanted (FLUAD) 0.5 ML injection Inject into the muscle. 0.5 mL 0   predniSONE (DELTASONE) 10 MG tablet Take 6 tablets by mouth daily for 1 day, 5 tabs daily for 1 day 4 tabs daily for 1 day 3 tabs daily for 1 day 2 tabs daily for 1 day and 1 tab daily for 1 day 21 tablet 0   warfarin (COUMADIN) 5 MG tablet TAKE 1 TABLET BY MOUTH ONCE DAILY 30 tablet 5   [DISCONTINUED] DULoxetine (CYMBALTA) 30 MG capsule Take 1 capsule (30 mg total) by mouth daily. 30 capsule 0   No current facility-administered medications on file prior to visit.    BP 130/70   Pulse 61   Resp 18   Ht 5' 9"  (1.753 m)   SpO2 100%   BMI 35.44  kg/m        Objective:   Physical Exam  General- No acute distress. Pleasant patient. Neck- Full range of motion, no jvd Lungs-  Clear, even and unlabored. Heart- regular rate and rhythm. Neurologic- CNII- XII grossly intact.  Heent- rt maxillary sinus pressure. Rt ear tm normal. Left tm normal. Mild boggy turbinates. +pnd.      Assessment & Plan:   Patient Instructions  For sinus infection with eustachian tube dysfunction will rx keflex 500 mg twice daily and flonase nasal spray.  If you rare cough worsens can rx benzonatate.  Coumadin use and inr now in range. Minimal potential interact. So recommend this next week use 2.5 mg coumadin today and tomorrow. Then start back on 5 mg Friday until Tuesday.   Next week starting wed resume current regimen coumadin 2.5 mg and Thursday, Friday, sat, Sunday, Monday, Tuesday 5 mg.(Continue until next inr check)  If joint pain flares pending specialist visit let me know as can give taper prednisone if needed.   Follow up August 03, 2021 inr check. Sooner if needed for sinus infection or ear pressure.

## 2021-07-13 NOTE — Patient Instructions (Addendum)
For sinus infection with eustachian tube dysfunction will rx keflex 500 mg twice daily and flonase nasal spray.  If you rare cough worsens can rx benzonatate.  Coumadin use and inr now in range. Minimal potential interact. So recommend this next week use 2.5 mg coumadin today and tomorrow. Then start back on 5 mg Friday until Tuesday.   Next week starting wed resume current regimen coumadin 2.5 mg and Thursday, Friday, sat, Sunday, Monday, Tuesday 5 mg.(Continue until next inr check)  If joint pain flares pending specialist visit let me know as can give taper prednisone if needed.   Follow up August 03, 2021 inr check. Sooner if needed for sinus infection or ear pressure.

## 2021-07-19 ENCOUNTER — Other Ambulatory Visit: Payer: Self-pay | Admitting: Medical

## 2021-07-19 ENCOUNTER — Other Ambulatory Visit (HOSPITAL_BASED_OUTPATIENT_CLINIC_OR_DEPARTMENT_OTHER): Payer: Self-pay

## 2021-07-19 MED ORDER — ALPRAZOLAM 0.5 MG PO TABS
0.5000 mg | ORAL_TABLET | Freq: Two times a day (BID) | ORAL | 2 refills | Status: DC | PRN
Start: 2021-07-19 — End: 2021-10-20
  Filled 2021-07-19 – 2021-07-22 (×3): qty 60, 30d supply, fill #0
  Filled 2021-08-19: qty 60, 30d supply, fill #1
  Filled 2021-09-16: qty 60, 30d supply, fill #2

## 2021-07-19 NOTE — Telephone Encounter (Addendum)
Requesting: alprazolam 0.5mg   Contract: 03/23/21 UDS: 03/23/21 Last Visit: 07/13/21 Next Visit: None Last Refill: 06/22/21 #60 and 0RF  Please Advise  Rx refill sent to pt pharmacy.  Mackie Pai, PA-C

## 2021-07-20 ENCOUNTER — Other Ambulatory Visit (HOSPITAL_BASED_OUTPATIENT_CLINIC_OR_DEPARTMENT_OTHER): Payer: Self-pay

## 2021-07-21 ENCOUNTER — Other Ambulatory Visit (HOSPITAL_BASED_OUTPATIENT_CLINIC_OR_DEPARTMENT_OTHER): Payer: Self-pay

## 2021-07-22 ENCOUNTER — Other Ambulatory Visit (HOSPITAL_BASED_OUTPATIENT_CLINIC_OR_DEPARTMENT_OTHER): Payer: Self-pay

## 2021-07-28 ENCOUNTER — Ambulatory Visit: Payer: Medicare Other

## 2021-08-02 ENCOUNTER — Ambulatory Visit (INDEPENDENT_AMBULATORY_CARE_PROVIDER_SITE_OTHER): Payer: Medicare Other

## 2021-08-02 DIAGNOSIS — Z7901 Long term (current) use of anticoagulants: Secondary | ICD-10-CM | POA: Diagnosis not present

## 2021-08-02 LAB — POCT INR: INR: 3.5 — AB (ref 2.0–3.0)

## 2021-08-10 ENCOUNTER — Ambulatory Visit (INDEPENDENT_AMBULATORY_CARE_PROVIDER_SITE_OTHER): Payer: Medicare Other

## 2021-08-10 DIAGNOSIS — Z7901 Long term (current) use of anticoagulants: Secondary | ICD-10-CM

## 2021-08-10 LAB — POCT INR: INR: 2.5 (ref 2.0–3.0)

## 2021-08-10 NOTE — Progress Notes (Addendum)
Pt here for INR check per Esperanza Richters, PA-C  Goal INR = 2.0 - 3.0  Last INR = 1.8  Pt states that she is taking Coumadin 5 mg Thursday through Monday and 1/2 tab on all other days.  Pt denies recent antibiotics, no dietary changes and no unusual bruising / bleeding.  INR today = 2.5  Pt advised per Ramon Dredge: " take 2.5 daily and repeat in 1 month  Esperanza Richters, PA-C

## 2021-08-19 ENCOUNTER — Other Ambulatory Visit (HOSPITAL_BASED_OUTPATIENT_CLINIC_OR_DEPARTMENT_OTHER): Payer: Self-pay

## 2021-08-19 ENCOUNTER — Other Ambulatory Visit: Payer: Self-pay | Admitting: Medical

## 2021-08-19 DIAGNOSIS — Z7901 Long term (current) use of anticoagulants: Secondary | ICD-10-CM

## 2021-08-19 MED ORDER — WARFARIN SODIUM 5 MG PO TABS
ORAL_TABLET | Freq: Every day | ORAL | 5 refills | Status: DC
  Filled 2021-08-19: qty 30, 30d supply, fill #0
  Filled 2021-09-16: qty 30, 30d supply, fill #1
  Filled 2022-01-17: qty 30, 30d supply, fill #2
  Filled 2022-02-16: qty 30, 30d supply, fill #3
  Filled 2022-04-20: qty 30, 30d supply, fill #4
  Filled 2022-05-22: qty 30, 30d supply, fill #5

## 2021-08-25 ENCOUNTER — Ambulatory Visit (INDEPENDENT_AMBULATORY_CARE_PROVIDER_SITE_OTHER): Payer: Medicare Other | Admitting: Medical

## 2021-08-25 ENCOUNTER — Encounter: Payer: Self-pay | Admitting: Medical

## 2021-08-25 ENCOUNTER — Other Ambulatory Visit (HOSPITAL_BASED_OUTPATIENT_CLINIC_OR_DEPARTMENT_OTHER): Payer: Self-pay

## 2021-08-25 VITALS — BP 136/50 | HR 54 | Temp 98.5°F | Resp 18 | Ht 69.0 in

## 2021-08-25 DIAGNOSIS — Z7901 Long term (current) use of anticoagulants: Secondary | ICD-10-CM | POA: Diagnosis not present

## 2021-08-25 DIAGNOSIS — R768 Other specified abnormal immunological findings in serum: Secondary | ICD-10-CM | POA: Diagnosis not present

## 2021-08-25 DIAGNOSIS — M2550 Pain in unspecified joint: Secondary | ICD-10-CM | POA: Diagnosis not present

## 2021-08-25 DIAGNOSIS — Z86718 Personal history of other venous thrombosis and embolism: Secondary | ICD-10-CM | POA: Diagnosis not present

## 2021-08-25 MED ORDER — PREDNISONE 10 MG PO TABS
ORAL_TABLET | ORAL | 0 refills | Status: DC
  Filled 2021-08-25: qty 21, 6d supply, fill #0

## 2021-08-25 NOTE — Patient Instructions (Addendum)
Recent recurrent severe flare of left shoulder and hand pain as before. With Rheumatoid factor elevation and quick response to prednisone in past suspect RA. Will provide 6 day taper dose prednisone. Hopefully this will flare pain down to get you to end of august appt with rheumatologist. If not then please let me know.  Follow up mid September or sooner if needed.

## 2021-08-25 NOTE — Progress Notes (Signed)
Subjective:    Patient ID: Rita Watkins, female    DOB: 1955/02/23, 66 y.o.   MRN: [id number]  HPI  Pt still having joint pain left shoulder  and hand pain. Left shoulder pain is not new she had pain like this before.  Pt had elevated RF factor in past. Did refer to rheumaologist but appointment is delayed until 10-06-2021.   In past when I have prednisone taper for 6 days she states it was like magic. Pt states pain resolved but then 3 weeks ago pain gradually came back and now severe again.   Pt is not diabetic.   Past Medical History:  Diagnosis Date   Allergy    Anemia associated with acute blood loss 08/22/2016   Formatting of this note is different from the original.  CT abdomen 7/17:   1.  Large left retroperitoneal hematoma with mixed density blood products.  Assessment for active bleeding is not possible without IV contrast. 2.  Contrast in the renal collecting systems presumably from contrast enhanced studies performed on August 20, 2016 consistent with known impaired renal function.   CT abdomen 7/27:    Anxiety    Benign essential HTN 08/14/2016   Cardiac murmur 04/20/2020   Cigarette smoker 07/15/2014   COPD (chronic obstructive pulmonary disease) (HCC)    Deep vein thrombosis (DVT) of popliteal vein of both lower extremities ([place]) 08/18/2016   Last Assessment & Plan:  Formatting of this note might be different from the original. Not clear if provoked/unprovoked (pt had trauma to left toe back in 03/2016 with some decreased mobility thereafter) CT chest confirmed acute PE and also concerning for some bilateral upper lobe groundglass opacities Hematology on board and heparin drip started but  discontinued 7/17  2/2 large retroperitoneal he   Depression    Depression with anxiety 09/13/2012   Dizziness 07/19/2016   Essential hypertension 04/20/2020   History of pulmonary embolism 04/20/2020   Hyperlipidemia    Hypertension    Leukocytosis 07/30/2014   Lymphangitis 06/07/2016    Mycobacterial infection 06/07/2016   Mycobacterium infection, 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 Per dermatology: Please provide the patient with our outpatient clinic phone number [phone]) to call and schedule a follow up appointment for continued management.   Last Assessment & Plan:  Formatting of this note might be different from the original. Uncertain etiology Vasculitic vs infectious vs malignancy Extensive work up has   Obesity 09/20/2016   Last Assessment & Plan:  Formatting of this note might be different from the original. Body mass index is 22.67 kg/m.  - For weight loss counseling.   Obesity (BMI 30.0-34.9) 04/20/2020   Open leg wound, left, sequela 08/17/2016   Formatting of this note might be different from the original. - Chronic left wound starting in April 2018 and worsening despite antibiotics and debridement -Patient had multiple hospitalizations and was treated with steroids and antibiotics with worsening/extension of the wound - ESR 103, CRP 168 - ANA, factor V Leyden, Antithrombin III, Cardiolite and antibody IgG, C3 and C4 complement proteinase   Peripheral vascular disease ([place]) 08/19/2016   Last Assessment & Plan:  Formatting of this note might be different from the original. Bilateral popliteal DVT seen on admission Occlusion of the left common iliac with collateral blood flow to common femoral artery LLE angiogram on 08/22/16 for attempt  at revascularization to optimize blood flow for healing Left AKA 7/24 I&D 7/31 and 8/2 with further procedures planned.  ASA 81 mg daily PT consult   Pulmonary embolism without acute cor pulmonale (HCC) 08/18/2016   Last Assessment & Plan:  Formatting of this note might be different from the original. Uncertain etiology  Was on heparin gtt but discontinued 2/2 large retroperitoneal hematoma (now  stable on follow up CT) O2 PRN IVC placed  Vascular following No SOB, SpO2 93% on room air On warfarin, on hold due to supratherapeutic INR, will restart at 48m once able   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   Surgical wound, non healing 08/16/2016   Formatting of this note might be different from the original. Added automatically from request for surgery [id number]  Last Assessment & Plan:  Formatting of this note might be different from the original. Presented with large area of necrotic tissue LLE Bilateral popliteal DVT Etiology uncertain:  Ischemic vs vasculitis vs autoimmune vs possible occult malignancy Bx: leukocytoblastic vasculopathy  In   Tobacco use 08/18/2016   Last Assessment & Plan:  Formatting of this note might be different from the original. Patient has a 30+ pack year history of smoking Discussed low dose CT chest for cancer screening at admission, patient open to this but concerned about extra cost as she has no insurance at this time. No obvious suspicious lesions noted on CTA PE study.  Counseled regarding tobacco cessation     Social History   Socioeconomic History   Marital status: Widowed    Spouse name: Not on file   Number of children: Not on file   Years of education: high schoo   Highest education level: Not on file  Occupational History   Occupation: ADM. ASSISTANT    Employer: [employer]  Tobacco Use   Smoking status: Former    Packs/day: 1.00    Years: 35.00    Total pack years: 35.00    Types: Cigarettes    Quit date: 09/14/2016    Years since quitting: 4.9   Smokeless tobacco: Never  Substance and Sexual Activity   Alcohol use: No    Alcohol/week: 0.0 standard drinks of alcohol   Drug use: No   Sexual activity: Never  Other Topics Concern   Not on file  Social History Narrative   Does not exercise.   Social Determinants of Health   Financial Resource Strain: Low Risk  (10/06/2020)   Overall Financial Resource Strain (CARDIA)    Difficulty of Paying Living  Expenses: Not hard at all  Food Insecurity: No Food Insecurity (10/06/2020)   Hunger Vital Sign    Worried About Running Out of Food in the Last Year: Never true    Ran Out of Food in the Last Year: Never true  Transportation Needs: No Transportation Needs (10/06/2020)   PRAPARE - THydrologist(Medical): No    Lack of Transportation (Non-Medical): No  Physical Activity: Insufficiently Active (10/06/2020)   Exercise Vital Sign    Days of Exercise per Week: 7 days    Minutes of Exercise per Session: 20 min  Stress: No Stress Concern Present (10/06/2020)   FGardena   Feeling of Stress : Not at all  Social Connections: Moderately Isolated (10/06/2020)   Social Connection and Isolation Panel [NHANES]    Frequency of Communication with Friends and Family: More than three times a week    Frequency of Social  Gatherings with Friends and Family: More than three times a week    Attends Religious Services: More than 4 times per year    Active Member of Clubs or Organizations: No    Attends Archivist Meetings: Never    Marital Status: Widowed  Intimate Partner Violence: Not At Risk (10/06/2020)   Humiliation, Afraid, Rape, and Kick questionnaire    Fear of Current or Ex-Partner: No    Emotionally Abused: No    Physically Abused: No    Sexually Abused: No    Past Surgical History:  Procedure Laterality Date   FOOT SURGERY     Left   HIP PINNING     3 each hip   IRRIGATION AND DEBRIDEMENT ABSCESS N/A 06/27/2016   Procedure: IRRIGATION AND DEBRIDEMENT LEFT LEG WITH SOTFT TISSUE BIOPSY;  Surgeon: Clovis Riley, MD;  Location: [facility];  Service: General;  Laterality: N/A;   LYMPH NODE BIOPSY Left 06/09/2016   Procedure: BIOPSY LEFT LEG;  Surgeon: Wallace Going, DO;  Location: [facility];  Service: Plastics;  Laterality: Left;   SLIPPED CAPITAL FEMORAL EPIPHYSIS PINNING      Family  History  Problem Relation Age of Onset   Hypertension Mother    Hyperlipidemia Mother    Hypertension Brother    Asthma Brother     Allergies  Allergen Reactions   Avelox [Moxifloxacin Hcl In Nacl] Anaphylaxis   Montelukast Sodium Other (See Comments)    Insomnia   Pravastatin Other (See Comments)    Myalgias on 14m    Current Outpatient Medications on File Prior to Visit  Medication Sig Dispense Refill   ALPRAZolam (XANAX) 0.5 MG tablet Take 1 tablet (0.5 mg total) by mouth 2 (two) times daily as needed for anxiety. 60 tablet 2   amLODipine (NORVASC) 2.5 MG tablet Take 1 tablet (2.5 mg total) by mouth daily. 90 tablet 1   benazepril (LOTENSIN) 20 MG tablet TAKE 1 TABLET (20 MG TOTAL) BY MOUTH 2 (TWO) TIMES DAILY. 60 tablet 2   cephALEXin (KEFLEX) 500 MG capsule Take 1 capsule (500 mg total) by mouth 2 (two) times daily. 20 capsule 0   COVID-19 mRNA bivalent vaccine, Pfizer, injection Inject into the muscle. 0.3 mL 0   fluticasone (FLONASE) 50 MCG/ACT nasal spray Place 2 sprays into both nostrils daily. 16 g 1   folic acid (FOLVITE) 1 MG tablet Take 1 tablet (1 mg total) by mouth daily. 30 tablet 3   gabapentin (NEURONTIN) 300 MG capsule TAKE 1 CAPSULE (300 MG TOTAL) BY MOUTH AT BEDTIME. 30 capsule 2   influenza vaccine adjuvanted (FLUAD) 0.5 ML injection Inject into the muscle. 0.5 mL 0   predniSONE (DELTASONE) 10 MG tablet Take 6 tablets by mouth daily for 1 day, 5 tabs daily for 1 day 4 tabs daily for 1 day 3 tabs daily for 1 day 2 tabs daily for 1 day and 1 tab daily for 1 day 21 tablet 0   warfarin (COUMADIN) 5 MG tablet TAKE 1 TABLET BY MOUTH ONCE DAILY 30 tablet 5   [DISCONTINUED] DULoxetine (CYMBALTA) 30 MG capsule Take 1 capsule (30 mg total) by mouth daily. 30 capsule 0   No current facility-administered medications on file prior to visit.    BP (!) 136/50   Pulse (!) 54   Temp 98.5 F (36.9 C)   Resp 18   Ht _0  (1.753 m)   SpO2 97%   BMI 35.44 kg/m  Review of Systems  Constitutional:  Negative for chills, fatigue and fever.  Respiratory:  Negative for cough, chest tightness and wheezing.   Cardiovascular:  Negative for chest pain and palpitations.       No cardiac symptoms.  Gastrointestinal:  Negative for abdominal pain.  Musculoskeletal:  Positive for arthralgias. Negative for back pain.  Skin:  Negative for rash.  Hematological:  Negative for adenopathy. Does not bruise/bleed easily.    Past Medical History:  Diagnosis Date   Allergy    Anemia associated with acute blood loss 08/22/2016   Formatting of this note is different from the original.  CT abdomen 7/17:   1.  Large left retroperitoneal hematoma with mixed density blood products.  Assessment for active bleeding is not possible without IV contrast. 2.  Contrast in the renal collecting systems presumably from contrast enhanced studies performed on August 20, 2016 consistent with known impaired renal function.   CT abdomen 7/27:    Anxiety    Benign essential HTN 08/14/2016   Cardiac murmur 04/20/2020   Cigarette smoker 07/15/2014   COPD (chronic obstructive pulmonary disease) (HCC)    Deep vein thrombosis (DVT) of popliteal vein of both lower extremities ([place]) 08/18/2016   Last Assessment & Plan:  Formatting of this note might be different from the original. Not clear if provoked/unprovoked (pt had trauma to left toe back in 03/2016 with some decreased mobility thereafter) CT chest confirmed acute PE and also concerning for some bilateral upper lobe groundglass opacities Hematology on board and heparin drip started but  discontinued 7/17  2/2 large retroperitoneal he   Depression    Depression with anxiety 09/13/2012   Dizziness 07/19/2016   Essential hypertension 04/20/2020   History of pulmonary embolism 04/20/2020   Hyperlipidemia    Hypertension    Leukocytosis 07/30/2014   Lymphangitis 06/07/2016   Mycobacterial infection 06/07/2016   Mycobacterium infection,  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   Nausea 07/19/2016   Necrosis ([place]) 07/19/2016   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 Per dermatology: Please provide the patient with our outpatient clinic phone number [phone]) to call and schedule a follow up appointment for continued management.   Last Assessment & Plan:  Formatting of this note might be different from the original. Uncertain etiology Vasculitic vs infectious vs malignancy Extensive work up has   Obesity 09/20/2016   Last Assessment & Plan:  Formatting of this note might be different from the original. Body mass index is 22.67 kg/m.  - For weight loss counseling.   Obesity (BMI 30.0-34.9) 04/20/2020   Open leg wound, left, sequela 08/17/2016   Formatting of this note might be different from the original. - Chronic left wound starting in April 2018 and worsening despite antibiotics and debridement -Patient had multiple hospitalizations and was treated with steroids and antibiotics with worsening/extension of the wound - ESR 103, CRP 168 - ANA, factor V Leyden, Antithrombin III, Cardiolite and antibody IgG, C3 and C4 complement proteinase   Peripheral vascular disease ([place]) 08/19/2016   Last Assessment & Plan:  Formatting of this note might be different from the original. 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  ASA 81 mg daily PT consult   Pulmonary embolism without acute  cor pulmonale ([place]) 08/18/2016   Last Assessment & Plan:  Formatting of this note might be different from the original. Uncertain etiology  Was on heparin gtt but discontinued 2/2 large retroperitoneal hematoma (now stable on follow up CT) O2 PRN IVC placed  Vascular following No  SOB, SpO2 93% on room air On warfarin, on hold due to supratherapeutic INR, will restart at 54m once able   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   Surgical wound, non healing 08/16/2016   Formatting of this note might be different from the original. Added automatically from request for surgery [id number]  Last Assessment & Plan:  Formatting of this note might be different from the original. Presented with large area of necrotic tissue LLE Bilateral popliteal DVT Etiology uncertain:  Ischemic vs vasculitis vs autoimmune vs possible occult malignancy Bx: leukocytoblastic vasculopathy  In   Tobacco use 08/18/2016   Last Assessment & Plan:  Formatting of this note might be different from the original. Patient has a 30+ pack year history of smoking Discussed low dose CT chest for cancer screening at admission, patient open to this but concerned about extra cost as she has no insurance at this time. No obvious suspicious lesions noted on CTA PE study.  Counseled regarding tobacco cessation     Social History   Socioeconomic History   Marital status: Widowed    Spouse name: Not on file   Number of children: Not on file   Years of education: high schoo   Highest education level: Not on file  Occupational History   Occupation: ADM. ASSISTANT    Employer: [employer]  Tobacco Use   Smoking status: Former    Packs/day: 1.00    Years: 35.00    Total pack years: 35.00    Types: Cigarettes    Quit date: 09/14/2016    Years since quitting: 4.9   Smokeless tobacco: Never  Substance and Sexual Activity   Alcohol use: No    Alcohol/week: 0.0 standard drinks of alcohol   Drug use: No   Sexual activity: Never  Other Topics Concern   Not on file  Social History Narrative   Does not exercise.   Social Determinants of Health   Financial Resource Strain: Low Risk  (10/06/2020)   Overall Financial Resource Strain (CARDIA)    Difficulty of Paying Living Expenses: Not hard at all  Food Insecurity: No Food Insecurity  (10/06/2020)   Hunger Vital Sign    Worried About Running Out of Food in the Last Year: Never true    Ran Out of Food in the Last Year: Never true  Transportation Needs: No Transportation Needs (10/06/2020)   PRAPARE - THydrologist(Medical): No    Lack of Transportation (Non-Medical): No  Physical Activity: Insufficiently Active (10/06/2020)   Exercise Vital Sign    Days of Exercise per Week: 7 days    Minutes of Exercise per Session: 20 min  Stress: No Stress Concern Present (10/06/2020)   FButler   Feeling of Stress : Not at all  Social Connections: Moderately Isolated (10/06/2020)   Social Connection and Isolation Panel [NHANES]    Frequency of Communication with Friends and Family: More than three times a week    Frequency of Social Gatherings with Friends and Family: More than three times a week    Attends Religious Services: More than 4 times per year    Active Member of [employer] Organizations:  No    Attends Archivist Meetings: Never    Marital Status: Widowed  Intimate Partner Violence: Not At Risk (10/06/2020)   Humiliation, Afraid, Rape, and Kick questionnaire    Fear of Current or Ex-Partner: No    Emotionally Abused: No    Physically Abused: No    Sexually Abused: No    Past Surgical History:  Procedure Laterality Date   FOOT SURGERY     Left   HIP PINNING     3 each hip   IRRIGATION AND DEBRIDEMENT ABSCESS N/A 06/27/2016   Procedure: IRRIGATION AND DEBRIDEMENT LEFT LEG WITH SOTFT TISSUE BIOPSY;  Surgeon: Clovis Riley, MD;  Location: [facility];  Service: General;  Laterality: N/A;   LYMPH NODE BIOPSY Left 06/09/2016   Procedure: BIOPSY LEFT LEG;  Surgeon: Wallace Going, DO;  Location: [facility];  Service: Plastics;  Laterality: Left;   SLIPPED CAPITAL FEMORAL EPIPHYSIS PINNING      Family History  Problem Relation Age of Onset   Hypertension Mother     Hyperlipidemia Mother    Hypertension Brother    Asthma Brother     Allergies  Allergen Reactions   Avelox [Moxifloxacin Hcl In Nacl] Anaphylaxis   Montelukast Sodium Other (See Comments)    Insomnia   Pravastatin Other (See Comments)    Myalgias on 82m    Current Outpatient Medications on File Prior to Visit  Medication Sig Dispense Refill   ALPRAZolam (XANAX) 0.5 MG tablet Take 1 tablet (0.5 mg total) by mouth 2 (two) times daily as needed for anxiety. 60 tablet 2   amLODipine (NORVASC) 2.5 MG tablet Take 1 tablet (2.5 mg total) by mouth daily. 90 tablet 1   benazepril (LOTENSIN) 20 MG tablet TAKE 1 TABLET (20 MG TOTAL) BY MOUTH 2 (TWO) TIMES DAILY. 60 tablet 2   cephALEXin (KEFLEX) 500 MG capsule Take 1 capsule (500 mg total) by mouth 2 (two) times daily. 20 capsule 0   COVID-19 mRNA bivalent vaccine, Pfizer, injection Inject into the muscle. 0.3 mL 0   fluticasone (FLONASE) 50 MCG/ACT nasal spray Place 2 sprays into both nostrils daily. 16 g 1   folic acid (FOLVITE) 1 MG tablet Take 1 tablet (1 mg total) by mouth daily. 30 tablet 3   gabapentin (NEURONTIN) 300 MG capsule TAKE 1 CAPSULE (300 MG TOTAL) BY MOUTH AT BEDTIME. 30 capsule 2   influenza vaccine adjuvanted (FLUAD) 0.5 ML injection Inject into the muscle. 0.5 mL 0   predniSONE (DELTASONE) 10 MG tablet Take 6 tablets by mouth daily for 1 day, 5 tabs daily for 1 day 4 tabs daily for 1 day 3 tabs daily for 1 day 2 tabs daily for 1 day and 1 tab daily for 1 day 21 tablet 0   warfarin (COUMADIN) 5 MG tablet TAKE 1 TABLET BY MOUTH ONCE DAILY 30 tablet 5   [DISCONTINUED] DULoxetine (CYMBALTA) 30 MG capsule Take 1 capsule (30 mg total) by mouth daily. 30 capsule 0   No current facility-administered medications on file prior to visit.    BP (!) 136/50   Pulse (!) 54   Temp 98.5 F (36.9 C)   Resp 18   Ht _0  (1.753 m)   SpO2 97%   BMI 35.44 kg/m        Objective:   Physical Exam  General- No acute distress.  Pleasant patient. Neck- Full range of motion, no jvd Lungs- Clear, even and unlabored. Heart- regular rate and rhythm.  Neurologic- CNII- XII grossly intact.  Left shouder- pain on palpation and rom. Rt hand- 2nd digit mild swollen, faint warm and tender. Rt ankle- mild tender to palpation. Not warm.     Assessment & Plan:   Patient Instructions  Recent recurrent severe flare of left shoulder and hand pain as before. With Rheumatoid factor elevation and quick response to prednisone in past suspect RA. Will provide 6 day taper dose prednisone. Hopefully this will flare pain down to get you to end of august appt with rheumatologist. If not then please let me know.  Follow up mid September or sooner if needed.

## 2021-09-15 ENCOUNTER — Ambulatory Visit: Payer: Medicare Other

## 2021-09-16 ENCOUNTER — Other Ambulatory Visit: Payer: Self-pay | Admitting: Medical

## 2021-09-16 ENCOUNTER — Other Ambulatory Visit (HOSPITAL_BASED_OUTPATIENT_CLINIC_OR_DEPARTMENT_OTHER): Payer: Self-pay

## 2021-09-19 ENCOUNTER — Other Ambulatory Visit (HOSPITAL_BASED_OUTPATIENT_CLINIC_OR_DEPARTMENT_OTHER): Payer: Self-pay

## 2021-09-19 MED ORDER — BENAZEPRIL HCL 20 MG PO TABS
ORAL_TABLET | Freq: Two times a day (BID) | ORAL | 2 refills | Status: DC
  Filled 2021-09-19: qty 60, 30d supply, fill #0
  Filled 2021-10-20: qty 60, 30d supply, fill #1
  Filled 2021-11-23: qty 60, 30d supply, fill #2

## 2021-09-19 MED ORDER — GABAPENTIN 300 MG PO CAPS
ORAL_CAPSULE | Freq: Every day | ORAL | 2 refills | Status: DC
  Filled 2021-09-19: qty 30, 30d supply, fill #0
  Filled 2021-10-20: qty 30, 30d supply, fill #1
  Filled 2021-11-23: qty 30, 30d supply, fill #2

## 2021-09-20 ENCOUNTER — Ambulatory Visit (INDEPENDENT_AMBULATORY_CARE_PROVIDER_SITE_OTHER): Payer: Medicare Other | Admitting: Medical

## 2021-09-20 ENCOUNTER — Telehealth: Payer: Self-pay | Admitting: Medical

## 2021-09-20 ENCOUNTER — Encounter: Payer: Self-pay | Admitting: Medical

## 2021-09-20 DIAGNOSIS — Z7901 Long term (current) use of anticoagulants: Secondary | ICD-10-CM

## 2021-09-20 LAB — POCT INR: INR: 5.2 — AB (ref 2.0–3.0)

## 2021-09-20 NOTE — Progress Notes (Signed)
Pt here for INR check per  Goal INR =2.0 to 3.0  Last INR =2.5  Pt currently takes Coumadin 2.5 daily.  Pt denies recent antibiotics, no dietary changes and no unusual bruising / bleeding.  INR today = 5.2  Pt advised per Ramon Dredge he will contact her later today with new instructions and follow up information.   Discussed with pt today. She did take 2.5 mg today. Will hold wed, Thursday dosing. And get repeat inr on Friday morning. Dr. Abner Greenspan update so she can make changes on dosing regimen.  Esperanza Richters, PA-C

## 2021-09-20 NOTE — Telephone Encounter (Addendum)
When patient is INR was 2.5 Coumadin regimen was   5 mg Wednesday,  Thursday,Friday, Saturday and Sunday. 2.5 mg Monday and Tuesday  As I mentioned I would have advised patient stay on same regimen and repeat INR in 1 month.   The change that occurred over the last 2 months preceding today's INR was per patient 2.5 mg daily.  She has been following this regimen over the last month.  I am advising patient to hold Coumadin Wednesday, Thursday and follow-up early Friday morning for INR.

## 2021-09-20 NOTE — Telephone Encounter (Signed)
This is pt I had mentioned about inr.

## 2021-09-23 ENCOUNTER — Ambulatory Visit (INDEPENDENT_AMBULATORY_CARE_PROVIDER_SITE_OTHER): Payer: Medicare Other

## 2021-09-23 DIAGNOSIS — Z7901 Long term (current) use of anticoagulants: Secondary | ICD-10-CM

## 2021-09-23 LAB — POCT INR: INR: 2.7 (ref 2.0–3.0)

## 2021-09-23 NOTE — Patient Instructions (Addendum)
Per Dr. Carmelia Roller- go back to Coumadin 5mg  Wednesday, Thursday, Fridays, Saturday and Sunday. Coumadin 2.5mg  on Mondays and Tuesdays. Recheck in 1 week.

## 2021-09-23 NOTE — Progress Notes (Signed)
Pt here today for INR recheck.   Last INR on 09/20/21 was 5.2.  Pt was taking Coumadin 2.5mg  daily as instructed on 08/10/21 by Ramon Dredge, but was previously on Coumadin 5mg  on Wed, Thursday, Friday, Saturday and Sundays, then Coumadin 2.5mg  on Mondays and Tuesdays on 08/10/21- INR at that time was 2.5. She was instructed on 09/20/21 to hold 2 doses, then recheck INR today, 09/23/21.  INR today: 2.7  Per Dr. 09/25/21- go back to Coumadin 5mg  Wednesday, Thursday, Friday Saturday and Sunday. Coumadin 2.5mg  on Mondays and Thursdays. Recheck in 1 week.

## 2021-09-30 ENCOUNTER — Ambulatory Visit (INDEPENDENT_AMBULATORY_CARE_PROVIDER_SITE_OTHER): Payer: Medicare Other

## 2021-09-30 DIAGNOSIS — Z7901 Long term (current) use of anticoagulants: Secondary | ICD-10-CM | POA: Diagnosis not present

## 2021-09-30 LAB — POCT INR: INR: 2.2 (ref 2.0–3.0)

## 2021-09-30 NOTE — Progress Notes (Signed)
Pt here for INR check per Dr Carmelia Roller  Goal INR = 2.0 - 3.0  Last INR = 2.7  Instructions per last INR check: Per Dr. Carmelia Roller- go back to Coumadin 5mg  Wednesday, Thursday, Friday Saturday and Sunday. Coumadin 2.5mg  on Mondays and Th Recheck in 1 week.   Pt denies recent antibiotics, no dietary changes and no unusual bruising / bleeding.  INR today = 2.2  Pt advised per Dr 01-24-2001:  Continue taking Coumadin 5mg  Wednesday, Thursday, Friday Saturday and Sunday. Coumadin 2.5mg  on Mondays and Tuesday.  Recheck 4 weeks.

## 2021-10-04 ENCOUNTER — Other Ambulatory Visit (HOSPITAL_BASED_OUTPATIENT_CLINIC_OR_DEPARTMENT_OTHER): Payer: Self-pay

## 2021-10-05 NOTE — Progress Notes (Unsigned)
Office Visit Note  Patient: Rita Watkins             Date of Birth: 08-20-1955           MRN: [id number]             PCP: Elise Benne Referring: Elise Benne Visit Date: 10/06/2021   Subjective:  New Patient (Initial Visit) (Bil hand pain, left shoulder pain, right ankle pain)   History of Present Illness: Rita Watkins is a 66 y.o. 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 She developed pain and swelling involving ankle and in right 2nd finger that was treated with prednisone taper with good response in PCP office. Lab testing positive for RF and elevated uric acid level. She has had joint pain in multiple areas but symptoms significantly increased since earlier this year.  In May she developed severe pain involving the left shoulder and right hand particularly her second finger.  She saw significant swelling in the hands and around the right ankle.  She was treated with a oral prednisone course lasting 6 days and the symptoms stayed improved for a little over 1 month.  She had a recurrence of joint swelling in July that also improved quickly with oral prednisone.  Due to the swelling and warmth around the ankle she is been treated with oral antibiotics that did not significantly affect her symptoms. 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  Was complicated by ultimately developing sepsis requiring hospital visit and prolonged nursing care treatment course.   Labs reviewed 06/2021 ANA 1:80 cytoplasmic reticular/AMA RF 40 Uric acid 8.3 ESR 33 CPR <1  Activities of Daily Living:  Patient reports morning stiffness for 10 minutes.   Patient Reports nocturnal pain.  Difficulty dressing/grooming: Reports Difficulty climbing stairs: Reports Difficulty getting out of chair:  Reports Difficulty using hands for taps, buttons, cutlery, and/or writing: Reports  Review of Systems  Constitutional:  Positive for fatigue.  HENT:  Positive for mouth dryness. Negative for mouth sores.   Eyes:  Negative for dryness.  Respiratory:  Negative for shortness of breath.   Cardiovascular:  Positive for palpitations. Negative for chest pain.  Gastrointestinal:  Negative for blood in stool, constipation and diarrhea.  Endocrine: Negative for increased urination.  Genitourinary:  Negative for involuntary urination.  Musculoskeletal:  Positive for joint pain, gait problem, joint pain, joint swelling, myalgias, muscle weakness, morning stiffness, muscle tenderness and myalgias.  Skin:  Positive for color change, rash and sensitivity to sunlight. Negative for hair loss.  Allergic/Immunologic: Negative for susceptible to infections.  Neurological:  Negative for dizziness and headaches.  Hematological:  Negative for swollen glands.  Psychiatric/Behavioral:  Positive for depressed mood. Negative for sleep disturbance. The patient is nervous/anxious.     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  Peripheral vascular disease ([place]) 08/19/2016   Deep vein thrombosis (DVT) of popliteal vein of both lower extremities ([place]) 08/18/2016   Pulmonary embolism without acute cor pulmonale ([place]) 08/18/2016   Tobacco use 08/18/2016   Open leg wound, left, sequela 08/17/2016   Surgical  wound, non healing 08/16/2016   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  CT abdomen 7/17:   1.  Large left retroperitoneal hematoma with mixed density blood products.  Assessment for active bleeding is not possible without IV contrast. 2.  Contrast in the renal collecting systems presumably from contrast enhanced studies performed on August 20, 2016 consistent with known impaired renal function.   CT abdomen 7/27:    Anxiety    Benign essential HTN 08/14/2016   Cardiac murmur 04/20/2020   Cigarette smoker 07/15/2014   COPD (chronic obstructive pulmonary disease) (HCC)    Deep vein thrombosis (DVT) of popliteal vein of both lower extremities ([place]) 08/18/2016   Last Assessment & Plan:  Formatting of this note might be different from the original. Not clear if provoked/unprovoked (pt had trauma to left toe back in 03/2016 with some decreased mobility thereafter) CT chest confirmed acute PE and also concerning for some bilateral upper lobe groundglass opacities Hematology on board and heparin drip started but  discontinued 7/17  2/2 large retroperitoneal he   Depression    Depression with anxiety 09/13/2012   Dizziness 07/19/2016   Essential hypertension 04/20/2020   History of pulmonary embolism 04/20/2020   Hyperlipidemia    Hypertension    Leukocytosis 07/30/2014   Lymphangitis 06/07/2016   Mycobacterial infection 06/07/2016   Mycobacterium infection, 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 Per dermatology: Please provide the patient with our outpatient clinic phone number [phone]) to call and schedule a follow up appointment for continued management.   Last Assessment & Plan:  Formatting of this note might be different from the original. Uncertain etiology Vasculitic vs infectious vs malignancy Extensive work up has   Obesity 09/20/2016   Last Assessment & Plan:  Formatting of this note might be different from the original. Body mass index is 22.67 kg/m.  - For weight loss counseling.   Obesity (BMI 30.0-34.9) 04/20/2020   Open leg wound, left, sequela 08/17/2016   Formatting of this note might be different from the original. - Chronic left wound starting in April 2018 and worsening despite antibiotics and debridement -Patient had multiple hospitalizations and was treated with steroids and antibiotics with worsening/extension of the wound - ESR 103, CRP 168 - ANA, factor V Leyden, Antithrombin III, Cardiolite and antibody IgG, C3 and C4 complement proteinase   Peripheral vascular disease ([place]) 08/19/2016   Last Assessment & Plan:  Formatting of this note might be different from the original. Bilateral popliteal DVT seen on admission Occlusion of the left common iliac with collateral blood flow to common femoral artery LLE angiogram on 08/22/16 for attempt at revascularization to optimize blood flow  for healing Left AKA 7/24 I&D 7/31 and 8/2 with further procedures planned.  ASA 81 mg daily PT consult   Pulmonary embolism without acute cor pulmonale (HCC) 08/18/2016   Last Assessment & Plan:  Formatting of this note might be different from the original. 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 Added automatically from request for surgery [id number]  Last Assessment & Plan:  Formatting of this note might be different from the original. Presented with large area of necrotic tissue LLE Bilateral popliteal DVT Etiology uncertain:  Ischemic vs vasculitis vs autoimmune vs possible occult malignancy Bx: leukocytoblastic vasculopathy  In   Tobacco use 08/18/2016   Last Assessment & Plan:  Formatting of this note might be different from the original. Patient has a 30+ pack year history of smoking Discussed low dose CT chest for cancer screening at admission, patient open to this but concerned about extra cost as she has no insurance at this time. No obvious suspicious lesions noted on CTA PE study.  Counseled regarding tobacco cessation    Family History  Problem Relation Age of Onset   Hypertension Mother    Hyperlipidemia Mother    Osteoarthritis Mother    Rheum arthritis Mother    Hypertension Brother    Asthma Brother    Osteoarthritis Brother    Past Surgical History:  Procedure Laterality Date   FOOT SURGERY     Left   HIP PINNING     3 each hip   IRRIGATION AND DEBRIDEMENT ABSCESS N/A 06/27/2016   Procedure: IRRIGATION AND DEBRIDEMENT LEFT LEG WITH SOTFT TISSUE BIOPSY;  Surgeon: CClovis Riley MD;  Location: [facility];  Service: General;  Laterality: N/A;   LEG AMPUTATION Left    LYMPH NODE BIOPSY Left 06/09/2016   Procedure: BIOPSY LEFT LEG;  Surgeon: DWallace Going DO;  Location: [facility];  Service: Plastics;  Laterality: Left;   SLIPPED CAPITAL FEMORAL EPIPHYSIS PINNING     Social History   Social History Narrative   Does not exercise.   Immunization History  Administered Date(s) Administered   Fluad Quad(high Dose 65+) 12/18/2018, 11/01/2020   Influenza Split 11/20/2013   Influenza Whole 11/10/2012   Influenza, High Dose Seasonal  PF 11/29/2015   Influenza,inj,Quad PF,6+ Mos 10/29/2014, 11/15/2017   PFIZER Comirnaty(Gray Top)Covid-19 Tri-Sucrose Vaccine 05/19/2020   PFIZER(Purple Top)SARS-COV-2 Vaccination 04/20/2019, 05/10/2019   Pfizer Covid-19 Vaccine Bivalent Booster 168yr& up 11/01/2020   Pneumococcal Conjugate-13 12/18/2018   Pneumococcal Polysaccharide-23 09/14/2011   Tdap 04/09/2008     Objective: Vital Signs: BP 139/79 (BP Location: Left Arm, Patient Position: Sitting, Cuff Size: Normal)   Pulse (!) 52    Physical Exam Constitutional:      Appearance: She is obese.  Eyes:     Conjunctiva/sclera: Conjunctivae normal.  Cardiovascular:     Rate and Rhythm: Normal rate and regular rhythm.  Pulmonary:     Effort: Pulmonary effort is normal.     Breath sounds: Normal breath sounds.  Musculoskeletal:     Right lower leg: Edema present.  Lymphadenopathy:     Cervical: No cervical adenopathy.  Skin:  General: Skin is warm and dry.     Findings: Rash present.     Comments: Pitting edema of right lower leg and foot, mild erythema and warmth extending less than halfway to knee Scaly and hyperpigmented skin changes on lateral side of foot and medial side of ankle, not particularly tender or warm, mild skin peeling on sole of foot, no ulcers or blisters  Neurological:     Mental Status: She is alert.  Psychiatric:        Mood and Affect: Mood normal.      Musculoskeletal Exam:  Right shoulder full ROM, left shoulder decreased abduction ROM, tenderness to pressure around all sides of shoulder and extending along medial side of scapula and under arm, no palpable swelling or warmth Elbows full ROM no tenderness or swelling Wrists full ROM no tenderness or swelling Fingers full ROM tenderness to pressure on right 2nd-3rd MCPs and PIPs with no palpable swelling Right knee nontender no palpable swelling Right ankle tenderness, also extending up whole shin, grossly intact ROM, no MTP squeeze  tenderness   Investigation: No additional findings.  Imaging: XR Shoulder Left  Result Date: 10/25/2021 X-ray left shoulder 4 views Humerus appears held in elevated position limiting some viewing angles.  Internal and external rotation appears well-preserved.  Acromiohumeral distance may be decreased.  Mild surface irregularity on glenohumeral joint without any prominent osteophytes or glenoid sclerosis.  AC joint space appears narrowed.  No visible effusions or abnormal calcifications seen. Impression Mild appearing osteoarthritis shoulder with possible narrowing of acromiohumeral distance though may be positional effect  XR Ankle 2 Views Right  Result Date: 10/25/2021 X-ray right ankle 2 views Some possible tibiotalar joint space narrowing or anterior osteophyte process.  Tiny plantar calcaneal enthesophyte.  Mild degenerative appearing changes on superior margin of midfoot joint spaces.  There are a few calcifications present at the lateral ankle.  No erosions seen.  There appears to be soft tissue swelling around the ankle. Impression Osteoarthritis changes in the ankle and midfoot do not appear severe no erosions or demineralization  XR Hand 2 View Left  Result Date: 10/25/2021 X-ray left hand 2 views Radiocarpal joint space appears normal.  There is moderate first CMC joint degenerative changes with bone spurring on trapezium.  MCP PIP and DIP joint spaces appear well-preserved.  There are no erosions or abnormal calcifications seen.  Bone mineralization suggest possible generalized osteopenia. 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  There is moderate first CMC joint degenerative changes with cysts osteophytes minimal subluxation.  MCP PIP and DIP joint spaces all appear well-preserved.  There are no erosions or abnormal  calcifications seen.  Bone mineralization suggest possible generalized osteopenia also decreased in the fifth metacarpal bone. Impression First CMC joint osteoarthritis, some generalized osteopenia   Recent Labs: Lab Results  Component Value Date   WBC 6.5 10/07/2021   HGB 11.3 (L) 10/07/2021   PLT 225 10/07/2021   NA 139 10/07/2021   K 4.9 10/07/2021   CL 106 10/07/2021   CO2 25 10/07/2021   GLUCOSE 114 (H) 10/07/2021   BUN 27 (H) 10/07/2021   CREATININE 1.18 (H) 10/07/2021   BILITOT 0.3 10/07/2021   ALKPHOS 81 04/05/2021   AST 15 10/07/2021   ALT 11 10/07/2021   PROT 6.7 10/07/2021   ALBUMIN 3.8 04/05/2021   CALCIUM  8.8 10/07/2021   GFRAA 72 08/05/2019    Speciality Comments: No specialty comments available.  Procedures:  No procedures performed Allergies: Avelox [moxifloxacin hcl in nacl], Montelukast sodium, and Pravastatin   Assessment / Plan:     Visit Diagnoses: Positive ANA (antinuclear antibody) - Plan: RNP Antibody, Anti-Smith antibody, Sjogrens syndrome-A extractable nuclear antibody, Anti-DNA antibody, double-stranded, C3 and C4  Rheumatoid factor positive - Plan: XR Hand 2 View Right, XR Hand 2 View Left, XR Shoulder Left, XR Ankle 2 Views Right  Sedimentation rate elevation  Ankle swelling, right  High risk medication use - Plan: CBC with Differential/Platelet, COMPLETE METABOLIC PANEL WITH GFR, CANCELED: COMPLETE METABOLIC PANEL WITH GFR, CANCELED: CBC with Differential/Platelet  ***  Orders: Orders Placed This Encounter  Procedures   XR Hand 2 View Right   XR Hand 2 View Left   XR Shoulder Left   XR Ankle 2 Views Right   CBC with Differential/Platelet   COMPLETE METABOLIC PANEL WITH GFR   RNP Antibody   Anti-Smith antibody   Sjogrens syndrome-A extractable nuclear antibody   Anti-DNA antibody, double-stranded   C3 and C4   COMPLETE METABOLIC PANEL WITH GFR   CBC with Differential/Platelet   Anti-DNA antibody, double-stranded   RNP Antibody    Anti-Smith antibody   Sjogrens syndrome-A extractable nuclear antibody   No orders of the defined types were placed in this encounter.    Follow-Up Instructions: Return in about 2 weeks (around 10/20/2021) for New pt ?RA f/u 2-3wks.   Collier Salina, MD  Note - This record has been created using [employer].  Chart creation errors have been sought, but may not always  have been located. Such creation errors do not reflect on  the standard of medical care.

## 2021-10-06 ENCOUNTER — Ambulatory Visit: Payer: Medicare Other | Attending: Internal Medicine | Admitting: Internal Medicine

## 2021-10-06 ENCOUNTER — Ambulatory Visit (INDEPENDENT_AMBULATORY_CARE_PROVIDER_SITE_OTHER): Payer: Medicare Other

## 2021-10-06 ENCOUNTER — Encounter: Payer: Self-pay | Admitting: Internal Medicine

## 2021-10-06 VITALS — BP 139/79 | HR 52

## 2021-10-06 DIAGNOSIS — R700 Elevated erythrocyte sedimentation rate: Secondary | ICD-10-CM

## 2021-10-06 DIAGNOSIS — M25471 Effusion, right ankle: Secondary | ICD-10-CM | POA: Diagnosis present

## 2021-10-06 DIAGNOSIS — R768 Other specified abnormal immunological findings in serum: Secondary | ICD-10-CM

## 2021-10-06 DIAGNOSIS — M25571 Pain in right ankle and joints of right foot: Secondary | ICD-10-CM

## 2021-10-06 DIAGNOSIS — M059 Rheumatoid arthritis with rheumatoid factor, unspecified: Secondary | ICD-10-CM

## 2021-10-06 DIAGNOSIS — M79642 Pain in left hand: Secondary | ICD-10-CM | POA: Diagnosis not present

## 2021-10-06 DIAGNOSIS — Z79899 Other long term (current) drug therapy: Secondary | ICD-10-CM | POA: Insufficient documentation

## 2021-10-06 DIAGNOSIS — M25512 Pain in left shoulder: Secondary | ICD-10-CM | POA: Diagnosis not present

## 2021-10-06 DIAGNOSIS — M79641 Pain in right hand: Secondary | ICD-10-CM

## 2021-10-06 HISTORY — DX: Rheumatoid arthritis with rheumatoid factor, unspecified: M05.9

## 2021-10-06 HISTORY — DX: Elevated erythrocyte sedimentation rate: R70.0

## 2021-10-06 HISTORY — DX: Effusion, right ankle: M25.471

## 2021-10-08 LAB — COMPLETE METABOLIC PANEL WITH GFR
AG Ratio: 1.2 (calc) (ref 1.0–2.5)
ALT: 11 U/L (ref 6–29)
AST: 15 U/L (ref 10–35)
Albumin: 3.7 g/dL (ref 3.6–5.1)
Alkaline phosphatase (APISO): 81 U/L (ref 37–153)
BUN/Creatinine Ratio: 23 (calc) — ABNORMAL HIGH (ref 6–22)
BUN: 27 mg/dL — ABNORMAL HIGH (ref 7–25)
CO2: 25 mmol/L (ref 20–32)
Calcium: 8.8 mg/dL (ref 8.6–10.4)
Chloride: 106 mmol/L (ref 98–110)
Creat: 1.18 mg/dL — ABNORMAL HIGH (ref 0.50–1.05)
Globulin: 3 g/dL (calc) (ref 1.9–3.7)
Glucose, Bld: 114 mg/dL — ABNORMAL HIGH (ref 65–99)
Potassium: 4.9 mmol/L (ref 3.5–5.3)
Sodium: 139 mmol/L (ref 135–146)
Total Bilirubin: 0.3 mg/dL (ref 0.2–1.2)
Total Protein: 6.7 g/dL (ref 6.1–8.1)
eGFR: 51 mL/min/{1.73_m2} — ABNORMAL LOW (ref >=60)

## 2021-10-08 LAB — ANTI-SMITH ANTIBODY: ENA SM Ab Ser-aCnc: <1 AI

## 2021-10-08 LAB — CBC WITH DIFFERENTIAL/PLATELET
Absolute Monocytes: 605 cells/uL (ref 200–950)
Basophils Absolute: 39 cells/uL (ref 0–200)
Basophils Relative: 0.6 %
Eosinophils Absolute: 163 cells/uL (ref 15–500)
Eosinophils Relative: 2.5 %
HCT: 34.9 % — ABNORMAL LOW (ref 35.0–45.0)
Hemoglobin: 11.3 g/dL — ABNORMAL LOW (ref 11.7–15.5)
Lymphs Abs: 2139 cells/uL (ref 850–3900)
MCH: 28.6 pg (ref 27.0–33.0)
MCHC: 32.4 g/dL (ref 32.0–36.0)
MCV: 88.4 fL (ref 80.0–100.0)
MPV: 10.4 fL (ref 7.5–12.5)
Monocytes Relative: 9.3 %
Neutro Abs: 3556 cells/uL (ref 1500–7800)
Neutrophils Relative %: 54.7 %
Platelets: 225 10*3/uL (ref 140–400)
RBC: 3.95 10*6/uL (ref 3.80–5.10)
RDW: 13.1 % (ref 11.0–15.0)
Total Lymphocyte: 32.9 %
WBC: 6.5 10*3/uL (ref 3.8–10.8)

## 2021-10-08 LAB — ANTI-DNA ANTIBODY, DOUBLE-STRANDED: ds DNA Ab: 1 IU/mL

## 2021-10-08 LAB — RNP ANTIBODY: Ribonucleic Protein(ENA) Antibody, IgG: <1 AI

## 2021-10-08 LAB — SJOGRENS SYNDROME-A EXTRACTABLE NUCLEAR ANTIBODY: SSA (Ro) (ENA) Antibody, IgG: <1 AI

## 2021-10-11 ENCOUNTER — Ambulatory Visit: Payer: Medicare Other

## 2021-10-12 ENCOUNTER — Ambulatory Visit (INDEPENDENT_AMBULATORY_CARE_PROVIDER_SITE_OTHER): Payer: Medicare Other | Admitting: *Deleted

## 2021-10-12 DIAGNOSIS — Z0000 Encounter for general adult medical examination without abnormal findings: Secondary | ICD-10-CM

## 2021-10-12 NOTE — Progress Notes (Addendum)
Subjective:   Rita Watkins is a 66 y.o. female who presents for Medicare Annual (Subsequent) preventive examination. I connected with  Rita Watkins on 10/12/21 by a audio enabled telemedicine application and verified that I am speaking with the correct person using two identifiers.  Patient Location: Home  Provider Location: Office/Clinic  I discussed the limitations of evaluation and management by telemedicine. The patient expressed understanding and agreed to proceed.  Review of Systems    Defer to PCP Cardiac Risk Factors include: advanced age (>69mn, >>60women);dyslipidemia;sedentary lifestyle;hypertension     Objective:    There were no vitals filed for this visit. There is no height or weight on file to calculate BMI.     10/12/2021    1:12 PM 10/06/2020   11:42 AM 05/18/2020    3:37 PM 05/23/2019    2:08 PM 08/14/2016   12:28 PM 06/26/2016    7:10 PM 06/26/2016    1:54 PM  Advanced Directives  Does Patient Have a Medical Advance Directive? Yes Yes Yes No No Yes Yes  Type of Advance Directive Out of facility DNR (pink MOST or yellow form);[place] Living will   [place] will  Does patient want to make changes to medical advance directive?      No - Patient declined   Copy of [place] Chart? No - copy requested No - copy requested No - copy requested   No - copy requested No - copy requested  Would patient like information on creating a medical advance directive?     No - Patient declined      Current Medications (verified) Outpatient Encounter Medications as of 10/12/2021  Medication Sig   ALPRAZolam (XANAX) 0.5 MG tablet Take 1 tablet (0.5 mg total) by mouth 2 (two) times daily as needed for anxiety.   amLODipine (NORVASC) 2.5 MG tablet Take 1 tablet (2.5 mg total) by mouth daily.   benazepril (LOTENSIN) 20 MG  tablet TAKE 1 TABLET (20 MG TOTAL) BY MOUTH 2 (TWO) TIMES DAILY.   cephALEXin (KEFLEX) 500 MG capsule Take 1 capsule (500 mg total) by mouth 2 (two) times daily. (Patient not taking: Reported on 10/06/2021)   COVID-19 mRNA bivalent vaccine, Pfizer, injection Inject into the muscle. (Patient not taking: Reported on 10/06/2021)   fluticasone (FLONASE) 50 MCG/ACT nasal spray Place 2 sprays into both nostrils daily. (Patient not taking: Reported on 87/47/3403   folic acid (FOLVITE) 1 MG tablet Take 1 tablet (1 mg total) by mouth daily. (Patient not taking: Reported on 10/06/2021)   gabapentin (NEURONTIN) 300 MG capsule TAKE 1 CAPSULE (300 MG TOTAL) BY MOUTH AT BEDTIME.   influenza vaccine adjuvanted (FLUAD) 0.5 ML injection Inject into the muscle. (Patient not taking: Reported on 10/06/2021)   predniSONE (DELTASONE) 10 MG tablet Take 6 tablets by mouth daily for 1 day, 5 tabs daily for 1 day 4 tabs daily for 1 day 3 tabs daily for 1 day 2 tabs daily for 1 day and 1 tab daily for 1 day   predniSONE (DELTASONE) 10 MG tablet Take 6 tablets by mouth daily for 1 day then 5 tabs by mouth for 1 day then  4 tabs by mouth for 1 day then 3 tabs by mouth for 1 day then 2 tabs for 1 day then 1 tab for 1 day (Patient not taking: Reported on 10/06/2021)   warfarin (  COUMADIN) 5 MG tablet TAKE 1 TABLET BY MOUTH ONCE DAILY   [DISCONTINUED] DULoxetine (CYMBALTA) 30 MG capsule Take 1 capsule (30 mg total) by mouth daily.   No facility-administered encounter medications on file as of 10/12/2021.    Allergies (verified) Avelox [moxifloxacin hcl in nacl], Montelukast sodium, and Pravastatin   History: Past Medical History:  Diagnosis Date   Allergy    Anemia associated with acute blood loss 08/22/2016   Formatting of this note is different from the original.  CT abdomen 7/17:   1.  Large left retroperitoneal hematoma with mixed density blood products.  Assessment for active bleeding is not possible without IV contrast. 2.   Contrast in the renal collecting systems presumably from contrast enhanced studies performed on August 20, 2016 consistent with known impaired renal function.   CT abdomen 7/27:    Anxiety    Benign essential HTN 08/14/2016   Cardiac murmur 04/20/2020   Cigarette smoker 07/15/2014   COPD (chronic obstructive pulmonary disease) (HCC)    Deep vein thrombosis (DVT) of popliteal vein of both lower extremities ([place]) 08/18/2016   Last Assessment & Plan:  Formatting of this note might be different from the original. Not clear if provoked/unprovoked (pt had trauma to left toe back in 03/2016 with some decreased mobility thereafter) CT chest confirmed acute PE and also concerning for some bilateral upper lobe groundglass opacities Hematology on board and heparin drip started but  discontinued 7/17  2/2 large retroperitoneal he   Depression    Depression with anxiety 09/13/2012   Dizziness 07/19/2016   Essential hypertension 04/20/2020   History of pulmonary embolism 04/20/2020   Hyperlipidemia    Hypertension    Leukocytosis 07/30/2014   Lymphangitis 06/07/2016   Mycobacterial infection 06/07/2016   Mycobacterium infection, 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 Per dermatology: Please provide the patient with our outpatient clinic phone number [phone]) to call and schedule a follow up appointment for continued management.   Last Assessment & Plan:  Formatting of this note might be different from the original. Uncertain etiology Vasculitic vs infectious vs malignancy Extensive work up has   Obesity 09/20/2016   Last Assessment & Plan:  Formatting of this note might be different from the original. Body mass index is 22.67 kg/m.  - For weight loss counseling.   Obesity (BMI 30.0-34.9) 04/20/2020   Open leg wound, left, sequela 08/17/2016   Formatting of this note  might be different from the original. - Chronic left wound starting in April 2018 and worsening despite antibiotics and debridement -Patient had multiple hospitalizations and was treated with steroids and antibiotics with worsening/extension of the wound - ESR 103, CRP 168 - ANA, factor V Leyden, Antithrombin III, Cardiolite and antibody IgG, C3 and C4 complement proteinase   Peripheral vascular disease ([place]) 08/19/2016   Last Assessment & Plan:  Formatting of this note might be different from the original. 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  ASA 81 mg daily PT consult   Pulmonary embolism without acute cor pulmonale ([place]) 08/18/2016   Last Assessment & Plan:  Formatting of this note might be different from the  original. 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 Added automatically from request for surgery [id number]  Last Assessment & Plan:  Formatting of this note might be different from the original. Presented with large area of necrotic tissue LLE Bilateral popliteal DVT Etiology uncertain:  Ischemic vs vasculitis vs autoimmune vs possible occult malignancy Bx: leukocytoblastic vasculopathy  In   Tobacco use 08/18/2016   Last Assessment & Plan:  Formatting of this note might be different from the original. Patient has a 30+ pack year history of smoking Discussed low dose CT chest for cancer screening at  admission, patient open to this but concerned about extra cost as she has no insurance at this time. No obvious suspicious lesions noted on CTA PE study.  Counseled regarding tobacco cessation   Past Surgical History:  Procedure Laterality Date   FOOT SURGERY     Left   HIP PINNING     3 each hip   IRRIGATION AND DEBRIDEMENT ABSCESS N/A 06/27/2016   Procedure: IRRIGATION AND DEBRIDEMENT LEFT LEG WITH SOTFT TISSUE BIOPSY;  Surgeon: CClovis Riley MD;  Location: [facility];  Service: General;  Laterality: N/A;   LEG AMPUTATION Left    LYMPH NODE BIOPSY Left 06/09/2016   Procedure: BIOPSY LEFT LEG;  Surgeon: DWallace Going DO;  Location: [facility];  Service: Plastics;  Laterality: Left;   SLIPPED CAPITAL FEMORAL EPIPHYSIS PINNING     Family History  Problem Relation Age of Onset   Hypertension Mother    Hyperlipidemia Mother    Osteoarthritis Mother    Rheum arthritis Mother    Hypertension Brother    Asthma Brother    Osteoarthritis Brother    Social History   Socioeconomic History   Marital status: Widowed    Spouse name: Not on file   Number of children: Not on file   Years of education: high schoo   Highest education level: Not on file  Occupational History   Occupation: ADM. ASSISTANT    Employer: [employer]  Tobacco Use   Smoking status: Former    Packs/day: 1.00    Years: 35.00    Total pack years: 35.00    Types: Cigarettes    Quit date: 09/14/2016    Years since quitting: 5.0    Passive exposure: Past   Smokeless tobacco: Never  Vaping Use   Vaping Use: Never used  Substance and Sexual Activity   Alcohol use: No    Alcohol/week: 0.0 standard drinks of alcohol   Drug use: No   Sexual activity: Never  Other Topics Concern   Not on file  Social History Narrative   Does not exercise.   Social Determinants of Health   Financial Resource Strain: Low Risk  (10/06/2020)   Overall Financial Resource Strain (CARDIA)    Difficulty of Paying Living Expenses: Not  hard at all  Food Insecurity: No Food Insecurity (10/06/2020)   Hunger Vital Sign    Worried About Running Out of Food in the Last Year: Never true    Ran Out of Food in the Last Year: Never true  Transportation Needs: No Transportation Needs (10/06/2020)  PRAPARE - Hydrologist (Medical): No    Lack of Transportation (Non-Medical): No  Physical Activity: Insufficiently Active (10/06/2020)   Exercise Vital Sign    Days of Exercise per Week: 7 days    Minutes of Exercise per Session: 20 min  Stress: No Stress Concern Present (10/06/2020)   [place]    Feeling of Stress : Not at all  Social Connections: Moderately Isolated (10/06/2020)   Social Connection and Isolation Panel [NHANES]    Frequency of Communication with Friends and Family: More than three times a week    Frequency of Social Gatherings with Friends and Family: More than three times a week    Attends Religious Services: More than 4 times per year    Active Member of [employer] or Organizations: No    Attends Archivist Meetings: Never    Marital Status: Widowed    Tobacco Counseling Counseling given: Not Answered   Clinical Intake:  Pre-visit preparation completed: Yes  Pain : No/denies pain     Diabetes: No  How often do you need to have someone help you when you read instructions, pamphlets, or other written materials from your doctor or pharmacy?: 1 - Never  Diabetic? No  Interpreter Needed?: No  Information entered by :: Beatris Ship, [place]   Activities of Daily Living    10/12/2021    1:11 PM  In your present state of health, do you have any difficulty performing the following activities:  Hearing? 0  Vision? 0  Difficulty concentrating or making decisions? 0  Walking or climbing stairs? 1  Comment does not walk  Dressing or bathing? 0  Doing errands, shopping? 1  Preparing Food and eating ? N   Using the Toilet? N  In the past six months, have you accidently leaked urine? N  Do you have problems with loss of bowel control? N  Managing your Medications? N  Managing your Finances? N  Housekeeping or managing your Housekeeping? N    Patient Care Team: Saguier, Iris Pert as PCP - General (Internal Medicine)  Indicate any recent Medical Services you may have received from other than [facility] providers in the past year (date may be approximate).     Assessment:   This is a routine wellness examination for Trisa.  Hearing/Vision screen No results found.  Dietary issues and exercise activities discussed: Current Exercise Habits: The patient does not participate in regular exercise at present, Exercise limited by: orthopedic condition(s);neurologic condition(s)   Goals Addressed             This Visit's Progress    Patient Stated   On track    Maintain  or improve current health       Depression Screen    10/12/2021    1:17 PM 10/06/2020   11:46 AM 07/21/2014   10:21 AM 06/05/2014    2:11 PM 09/12/2013    1:14 PM  PHQ 2/9 Scores  PHQ - 2 Score 1 0 0 0 0  Exception Documentation   Patient refusal      Fall Risk    10/12/2021    1:13 PM 10/06/2020   11:43 AM 07/21/2014   10:21 AM 06/05/2014    2:11 PM 09/12/2013    1:14 PM  Fall Risk   Falls in the past year? Exclusion - non ambulatory 1 No No No  Number falls in past yr: 0 0  Injury with Fall? 0 0     Risk for fall due to :  History of fall(s)     Follow up Falls evaluation completed Falls prevention discussed       FALL RISK PREVENTION PERTAINING TO THE HOME:  Any stairs in or around the home? No  If so, are there any without handrails?  No stairs Home free of loose throw rugs in walkways, pet beds, electrical cords, etc? Yes  Adequate lighting in your home to reduce risk of falls? Yes   ASSISTIVE DEVICES UTILIZED TO PREVENT FALLS:  Life alert? No  Use of a cane, walker or w/c? Yes  Grab bars in the  bathroom? Yes  Shower chair or bench in shower? Yes  Elevated toilet seat or a handicapped toilet? Yes     Cognitive Function:        10/12/2021    1:29 PM  6CIT Screen  What Year? 0 points  What month? 0 points  What time? 0 points  Count back from 20 0 points  Months in reverse 0 points  Repeat phrase 0 points  Total Score 0 points    Immunizations Immunization History  Administered Date(s) Administered   Fluad Quad(high Dose 65+) 12/18/2018, 11/01/2020   Influenza Split 11/20/2013   Influenza Whole 11/10/2012   Influenza, High Dose Seasonal PF 11/29/2015   Influenza,inj,Quad PF,6+ Mos 10/29/2014, 11/15/2017   PFIZER Comirnaty(Gray Top)Covid-19 Tri-Sucrose Vaccine 05/19/2020   PFIZER(Purple Top)SARS-COV-2 Vaccination 04/20/2019, 05/10/2019   Pfizer Covid-19 Vaccine Bivalent Booster 6yr & up 11/01/2020   Pneumococcal Conjugate-13 12/18/2018   Pneumococcal Polysaccharide-23 09/14/2011   Tdap 04/09/2008    TDAP status: Due, Education has been provided regarding the importance of this vaccine. Advised may receive this vaccine at local pharmacy or Health Dept. Aware to provide a copy of the vaccination record if obtained from local pharmacy or Health Dept. Verbalized acceptance and understanding.  Flu Vaccine status: Due, Education has been provided regarding the importance of this vaccine. Advised may receive this vaccine at local pharmacy or Health Dept. Aware to provide a copy of the vaccination record if obtained from local pharmacy or Health Dept. Verbalized acceptance and understanding.  Pneumococcal vaccine status: Due, Education has been provided regarding the importance of this vaccine. Advised may receive this vaccine at local pharmacy or Health Dept. Aware to provide a copy of the vaccination record if obtained from local pharmacy or Health Dept. Verbalized acceptance and understanding.  Covid-19 vaccine status: Information provided on how to obtain vaccines.    Qualifies for Shingles Vaccine? Yes   Zostavax completed No   Shingrix Completed?: No.    Education has been provided regarding the importance of this vaccine. Patient has been advised to call insurance company to determine out of pocket expense if they have not yet received this vaccine. Advised may also receive vaccine at local pharmacy or Health Dept. Verbalized acceptance and understanding.  Screening Tests Health Maintenance  Topic Date Due   Zoster Vaccines- Shingrix (1 of 2) Never done   MAMMOGRAM  02/06/2010   COLONOSCOPY (Pts 45-455yrInsurance coverage will need to be confirmed)  02/06/2013   TETANUS/TDAP  04/10/2018   DEXA SCAN  Never done   Pneumonia Vaccine 6568Years old (3 - PPSV23 or PCV20) 12/18/2019   COVID-19 Vaccine (5 - Pfizer risk series) 12/27/2020   INFLUENZA VACCINE  09/06/2021   Hepatitis C Screening  Completed   HPV VACCINES  Aged Out    Health Maintenance  Health  Maintenance Due  Topic Date Due   Zoster Vaccines- Shingrix (1 of 2) Never done   MAMMOGRAM  02/06/2010   COLONOSCOPY (Pts 45-76yr Insurance coverage will need to be confirmed)  02/06/2013   TETANUS/TDAP  04/10/2018   DEXA SCAN  Never done   Pneumonia Vaccine 68 Years old (3 - PPSV23 or PCV20) 12/18/2019   COVID-19 Vaccine (5 - Pfizer risk series) 12/27/2020   INFLUENZA VACCINE  09/06/2021    Colorectal cancer screening: No longer required. Pt does not desire to have this done anymore.  Mammogram status: Ordered N/a. Pt provided with contact info and advised to call to schedule appt.  Pt would like to postpone.  She can not stand.  Bone Density status: pt does not desire to have this done.  Lung Cancer Screening: (Low Dose CT Chest recommended if Age 68-80years, 30 pack-year currently smoking OR have quit w/in 15years.) does not qualify.   Lung Cancer Screening Referral: N/a  Additional Screening:  Hepatitis C Screening: does qualify; Completed 06/08/16  Vision Screening:  Recommended annual ophthalmology exams for early detection of glaucoma and other disorders of the eye. Is the patient up to date with their annual eye exam?  Yes  Who is the provider or what is the name of the office in which the patient attends annual eye exams? [facility] If pt is not established with a provider, would they like to be referred to a provider to establish care? No .   Dental Screening: Recommended annual dental exams for proper oral hygiene  Community Resource Referral / Chronic Care Management: CRR required this visit?  No   CCM required this visit?  No      Plan:     I have personally reviewed and noted the following in the patient's chart:   Medical and social history Use of alcohol, tobacco or illicit drugs  Current medications and supplements including opioid prescriptions. Patient is not currently taking opioid prescriptions. Functional ability and status Nutritional status Physical activity Advanced directives List of other physicians Hospitalizations, surgeries, and ER visits in previous 12 months Vitals Screenings to include cognitive, depression, and falls Referrals and appointments  In addition, I have reviewed and discussed with patient certain preventive protocols, quality metrics, and best practice recommendations. A written personalized care plan for preventive services as well as general preventive health recommendations were provided to patient.   Due to this being a telephonic visit, the after visit summary with patients personalized plan was offered to patient via mail or my-chart. Patient would like to access on my-chart.  BBeatris Ship CCandor  10/12/2021   Nurse Notes: None  Review and Agree with assessment & plan of CMA.  EMackie Pai PA-C

## 2021-10-12 NOTE — Patient Instructions (Signed)
Rita Watkins , Thank you for taking time to come for your Medicare Wellness Visit. I appreciate your ongoing commitment to your health goals. Please review the following plan we discussed and let me know if I can assist you in the future.   These are the goals we discussed:  Goals      Patient Stated     Maintain  or improve current health        This is a list of the screening recommended for you and due dates:  Health Maintenance  Topic Date Due   Zoster (Shingles) Vaccine (1 of 2) Never done   Mammogram  02/06/2010   Colon Cancer Screening  02/06/2013   Tetanus Vaccine  04/10/2018   DEXA scan (bone density measurement)  Never done   Pneumonia Vaccine (3 - PPSV23 or PCV20) 12/18/2019   COVID-19 Vaccine (5 - Pfizer risk series) 12/27/2020   Flu Shot  09/06/2021   Hepatitis C Screening: USPSTF Recommendation to screen - Ages 18-79 yo.  Completed   HPV Vaccine  Aged Out       Next appointment: Follow up in one year for your annual wellness visit    Preventive Care 65 Years and Older, Female Preventive care refers to lifestyle choices and visits with your health care provider that can promote health and wellness. What does preventive care include? A yearly physical exam. This is also called an annual well check. Dental exams once or twice a year. Routine eye exams. Ask your health care provider how often you should have your eyes checked. Personal lifestyle choices, including: Daily care of your teeth and gums. Regular physical activity. Eating a healthy diet. Avoiding tobacco and drug use. Limiting alcohol use. Practicing safe sex. Taking low-dose aspirin every day. Taking vitamin and mineral supplements as recommended by your health care provider. What happens during an annual well check? The services and screenings done by your health care provider during your annual well check will depend on your age, overall health, lifestyle risk factors, and family history of  disease. Counseling  Your health care provider may ask you questions about your: Alcohol use. Tobacco use. Drug use. Emotional well-being. Home and relationship well-being. Sexual activity. Eating habits. History of falls. Memory and ability to understand (cognition). Work and work Astronomer. Reproductive health. Screening  You may have the following tests or measurements: Height, weight, and BMI. Blood pressure. Lipid and cholesterol levels. These may be checked every 5 years, or more frequently if you are over 40 years old. Skin check. Lung cancer screening. You may have this screening every year starting at age 22 if you have a 30-pack-year history of smoking and currently smoke or have quit within the past 15 years. Fecal occult blood test (FOBT) of the stool. You may have this test every year starting at age 22. Flexible sigmoidoscopy or colonoscopy. You may have a sigmoidoscopy every 5 years or a colonoscopy every 10 years starting at age 8. Hepatitis C blood test. Hepatitis B blood test. Sexually transmitted disease (STD) testing. Diabetes screening. This is done by checking your blood sugar (glucose) after you have not eaten for a while (fasting). You may have this done every 1-3 years. Bone density scan. This is done to screen for osteoporosis. You may have this done starting at age 66. Mammogram. This may be done every 1-2 years. Talk to your health care provider about how often you should have regular mammograms. Talk with your health care provider about your  test results, treatment options, and if necessary, the need for more tests. Vaccines  Your health care provider may recommend certain vaccines, such as: Influenza vaccine. This is recommended every year. Tetanus, diphtheria, and acellular pertussis (Tdap, Td) vaccine. You may need a Td booster every 10 years. Zoster vaccine. You may need this after age 64. Pneumococcal 13-valent conjugate (PCV13) vaccine. One  dose is recommended after age 74. Pneumococcal polysaccharide (PPSV23) vaccine. One dose is recommended after age 77. Talk to your health care provider about which screenings and vaccines you need and how often you need them. This information is not intended to replace advice given to you by your health care provider. Make sure you discuss any questions you have with your health care provider. Document Released: 02/19/2015 Document Revised: 10/13/2015 Document Reviewed: 11/24/2014 Elsevier Interactive Patient Education  2017 [employer] Prevention in the Home Falls can cause injuries. They can happen to people of all ages. There are many things you can do to make your home safe and to help prevent falls. What can I do on the outside of my home? Regularly fix the edges of walkways and driveways and fix any cracks. Remove anything that might make you trip as you walk through a door, such as a raised step or threshold. Trim any bushes or trees on the path to your home. Use bright outdoor lighting. Clear any walking paths of anything that might make someone trip, such as rocks or tools. Regularly check to see if handrails are loose or broken. Make sure that both sides of any steps have handrails. Any raised decks and porches should have guardrails on the edges. Have any leaves, snow, or ice cleared regularly. Use sand or salt on walking paths during winter. Clean up any spills in your garage right away. This includes oil or grease spills. What can I do in the bathroom? Use night lights. Install grab bars by the toilet and in the tub and shower. Do not use towel bars as grab bars. Use non-skid mats or decals in the tub or shower. If you need to sit down in the shower, use a plastic, non-slip stool. Keep the floor dry. Clean up any water that spills on the floor as soon as it happens. Remove soap buildup in the tub or shower regularly. Attach bath mats securely with double-sided  non-slip rug tape. Do not have throw rugs and other things on the floor that can make you trip. What can I do in the bedroom? Use night lights. Make sure that you have a light by your bed that is easy to reach. Do not use any sheets or blankets that are too big for your bed. They should not hang down onto the floor. Have a firm chair that has side arms. You can use this for support while you get dressed. Do not have throw rugs and other things on the floor that can make you trip. What can I do in the kitchen? Clean up any spills right away. Avoid walking on wet floors. Keep items that you use a lot in easy-to-reach places. If you need to reach something above you, use a strong step stool that has a grab bar. Keep electrical cords out of the way. Do not use floor polish or wax that makes floors slippery. If you must use wax, use non-skid floor wax. Do not have throw rugs and other things on the floor that can make you trip. What can I do with my  stairs? Do not leave any items on the stairs. Make sure that there are handrails on both sides of the stairs and use them. Fix handrails that are broken or loose. Make sure that handrails are as long as the stairways. Check any carpeting to make sure that it is firmly attached to the stairs. Fix any carpet that is loose or worn. Avoid having throw rugs at the top or bottom of the stairs. If you do have throw rugs, attach them to the floor with carpet tape. Make sure that you have a light switch at the top of the stairs and the bottom of the stairs. If you do not have them, ask someone to add them for you. What else can I do to help prevent falls? Wear shoes that: Do not have high heels. Have rubber bottoms. Are comfortable and fit you well. Are closed at the toe. Do not wear sandals. If you use a stepladder: Make sure that it is fully opened. Do not climb a closed stepladder. Make sure that both sides of the stepladder are locked into place. Ask  someone to hold it for you, if possible. Clearly mark and make sure that you can see: Any grab bars or handrails. First and last steps. Where the edge of each step is. Use tools that help you move around (mobility aids) if they are needed. These include: Canes. Walkers. Scooters. Crutches. Turn on the lights when you go into a dark area. Replace any light bulbs as soon as they burn out. Set up your furniture so you have a clear path. Avoid moving your furniture around. If any of your floors are uneven, fix them. If there are any pets around you, be aware of where they are. Review your medicines with your doctor. Some medicines can make you feel dizzy. This can increase your chance of falling. Ask your doctor what other things that you can do to help prevent falls. This information is not intended to replace advice given to you by your health care provider. Make sure you discuss any questions you have with your health care provider. Document Released: 11/19/2008 Document Revised: 07/01/2015 Document Reviewed: 02/27/2014 Elsevier Interactive Patient Education  2017 [employer].

## 2021-10-14 NOTE — Progress Notes (Signed)
Office Visit Note  Patient: Rita Watkins             Date of Birth: 11/15/1955           MRN: [id number]             PCP: Elise Benne Referring: Elise Benne Visit Date: 10/25/2021   Subjective:  Follow-up (Lab results and x-rays. Left shoulder flare-up 8/10 pain. )   History of Present Illness: Rita Watkins is a 66 y.o. female here for follow up for evaluation of inflammatory arthritis concern for RA with repeat joint swelling, pain, and increased sedimentation rate and improvement on prednisone. Xrays at last visit showed no erosive disease and mild severity of OA changes. Lab tests were all negative for markers of ANA-related disease process. CBC and CMP okay, does have mild renal impairment. Since our visit she has a flare up of left shoulder pain limiting mobility. Pain radiates into the arm when getting more severe. Her ankle remains the same amount swollen. No new episode of hand swelling.  Previous HPI 10/06/2021 Rita Watkins is a 66 y.o. 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 She developed pain and swelling involving ankle and in right 2nd finger that was treated with prednisone taper with good response in PCP office. Lab testing positive for RF and elevated uric acid level. She has had joint pain in multiple areas but symptoms significantly increased since earlier this year.  In May she developed severe pain involving the left shoulder and right hand particularly her second finger.  She saw significant swelling in the hands and around the right ankle.  She was treated with a oral prednisone course lasting 6 days and the symptoms stayed improved for a little over 1 month.  She had a recurrence of joint swelling in July that also improved quickly with oral prednisone.  Due to the swelling and warmth around the ankle she is been treated with oral antibiotics that did not significantly affect her symptoms. 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  Was complicated by ultimately developing sepsis requiring hospital visit and prolonged nursing care treatment course.   Labs reviewed 06/2021 ANA 1:80 cytoplasmic reticular/AMA RF 40 Uric acid 8.3 ESR 33 CPR <1   Activities of Daily Living:  Patient reports morning stiffness for 10 minutes.   Patient Reports nocturnal pain.  Difficulty dressing/grooming: Reports Difficulty climbing stairs: Reports Difficulty getting out of chair: Reports Difficulty using hands for taps, buttons, cutlery, and/or writing: Reports   Review of Systems  Constitutional:  Positive for fatigue.  HENT:  Negative for mouth sores and mouth dryness.   Eyes:  Negative for dryness.  Respiratory:  Negative for shortness of breath.   Cardiovascular:  Negative for chest pain and palpitations.  Gastrointestinal:  Negative for blood in stool, constipation and diarrhea.  Endocrine: Negative for increased urination.  Genitourinary:  Negative for involuntary urination.  Musculoskeletal:  Positive for joint pain, gait problem, joint pain, myalgias, morning stiffness, muscle tenderness and myalgias. Negative for muscle weakness.  Skin:  Positive for sensitivity to sunlight. Negative for color change, rash and hair loss.  Allergic/Immunologic: Negative for susceptible to infections.  Neurological:  Negative for dizziness and headaches.  Hematological:  Negative for swollen glands.  Psychiatric/Behavioral:  Negative for depressed mood and sleep disturbance. The  patient is not nervous/anxious.     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   Peripheral vascular disease ([place]) 08/19/2016   Deep vein thrombosis (DVT) of popliteal vein of both lower extremities ([place]) 08/18/2016   Pulmonary embolism without acute cor pulmonale ([place]) 08/18/2016   Tobacco use 08/18/2016   Open leg wound, left, sequela 08/17/2016   Surgical wound, non healing 08/16/2016   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  CT abdomen 7/17:   1.  Large left retroperitoneal hematoma with mixed density blood products.  Assessment for active bleeding is not possible without IV contrast. 2.  Contrast in the renal collecting systems presumably from contrast enhanced studies performed on August 20, 2016 consistent with known impaired renal function.   CT abdomen 7/27:    Anxiety    Benign essential HTN 08/14/2016   Cardiac murmur 04/20/2020   Cigarette smoker 07/15/2014   COPD (chronic obstructive pulmonary disease) (HCC)    Deep vein thrombosis (DVT) of popliteal vein of both lower extremities ([place]) 08/18/2016   Last Assessment & Plan:  Formatting of this note might be different from the original. Not clear if provoked/unprovoked (pt had trauma to left toe back in 03/2016 with some decreased mobility thereafter) CT chest  confirmed acute PE and also concerning for some bilateral upper lobe groundglass opacities Hematology on board and heparin drip started but  discontinued 7/17  2/2 large retroperitoneal he   Depression    Depression with anxiety 09/13/2012   Dizziness 07/19/2016   Essential hypertension 04/20/2020   History of pulmonary embolism 04/20/2020   Hyperlipidemia    Hypertension    Leukocytosis 07/30/2014   Lymphangitis 06/07/2016   Mycobacterial infection 06/07/2016   Mycobacterium infection, 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 Per dermatology: Please provide the patient with our outpatient clinic phone number [phone]) to call and schedule a follow up appointment for continued management.   Last Assessment & Plan:  Formatting of this note might be different from the original. Uncertain etiology Vasculitic vs infectious vs malignancy Extensive work up has   Obesity 09/20/2016   Last Assessment & Plan:  Formatting of this note might be different from the original. Body mass index is 22.67 kg/m.  - For weight loss counseling.   Obesity (BMI 30.0-34.9) 04/20/2020   Open leg wound, left, sequela 08/17/2016   Formatting of this note might be different from the original. -  Chronic left wound starting in April 2018 and worsening despite antibiotics and debridement -Patient had multiple hospitalizations and was treated with steroids and antibiotics with worsening/extension of the wound - ESR 103, CRP 168 - ANA, factor V Ley[place], Antithrombin III, Cardiolite and antibody IgG, C3 and C4 complement proteinase   Peripheral vascular disease ([place]) 08/19/2016   Last Assessment & Plan:  Formatting of this note might be different from the original. Bilateral popliteal DVT seen on admission Occlusion of the left common iliac with collateral blood flow to common femoral artery  LLE angiogram on 08/22/16 for attempt at revascularization to optimize blood flow for healing Left AKA 7/24 I&D 7/31 and 8/2 with further procedures planned.  ASA 81 mg daily PT consult   Pulmonary embolism without acute cor pulmonale (HCC) 08/18/2016   Last Assessment & Plan:  Formatting of this note might be different from the original. 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 Added automatically from request for surgery [id number]  Last Assessment & Plan:  Formatting of this note might be different from the original. Presented with large area of necrotic tissue LLE Bilateral popliteal DVT Etiology uncertain:  Ischemic vs vasculitis vs autoimmune vs possible occult malignancy Bx: leukocytoblastic vasculopathy  In   Tobacco use 08/18/2016   Last Assessment & Plan:  Formatting of this note might be different from the original. Patient has a 30+ pack year history of smoking Discussed low dose CT chest for cancer screening at admission, patient open to this but concerned about extra cost as she has no insurance at this time. No obvious suspicious lesions noted on CTA PE study.  Counseled regarding tobacco cessation    Family History  Problem Relation Age of Onset   Hypertension Mother    Hyperlipidemia Mother    Osteoarthritis Mother    Rheum arthritis Mother    Hypertension Brother    Asthma Brother    Osteoarthritis Brother    Past Surgical History:  Procedure Laterality Date   FOOT SURGERY     Left   HIP PINNING     3 each hip   IRRIGATION AND DEBRIDEMENT ABSCESS N/A 06/27/2016   Procedure: IRRIGATION AND DEBRIDEMENT LEFT LEG WITH SOTFT  TISSUE BIOPSY;  Surgeon: CClovis Riley MD;  Location: [facility];  Service: General;  Laterality: N/A;   LEG AMPUTATION Left    LYMPH NODE BIOPSY Left 06/09/2016   Procedure: BIOPSY LEFT LEG;  Surgeon: DWallace Going DO;  Location: [facility];  Service: Plastics;  Laterality: Left;   SLIPPED CAPITAL FEMORAL EPIPHYSIS PINNING     Social History   Social History Narrative   Does not exercise.   Immunization History  Administered Date(s) Administered   Fluad Quad(high Dose 65+) 12/18/2018, 11/01/2020   Influenza Split 11/20/2013   Influenza Whole 11/10/2012   Influenza, High Dose Seasonal PF 11/29/2015   Influenza,inj,Quad PF,6+ Mos 10/29/2014, 11/15/2017   PFIZER Comirnaty(Gray Top)Covid-19 Tri-Sucrose Vaccine 05/19/2020   PFIZER(Purple Top)SARS-COV-2 Vaccination 04/20/2019, 05/10/2019   Pfizer Covid-19 Vaccine Bivalent Booster 122yr& up 11/01/2020   Pneumococcal Conjugate-13 12/18/2018   Pneumococcal Polysaccharide-23 09/14/2011   Tdap 04/09/2008  Objective: Vital Signs: BP 138/76 (BP Location: Right Arm, Patient Position: Sitting, Cuff Size: Normal)   Pulse (!) 51   Resp 13    Physical Exam Constitutional:      Appearance: She is obese.  Cardiovascular:     Rate and Rhythm: Normal rate and regular rhythm.  Pulmonary:     Effort: Pulmonary effort is normal.     Breath sounds: Normal breath sounds.  Musculoskeletal:     Right lower leg: Edema present.     Comments: Right lower extremity edema with pitting there is warmth and erythema at and superior to the ankle anteriorly and laterally  Lymphadenopathy:     Cervical: No cervical adenopathy.  Skin:    General: Skin is warm and dry.  Neurological:     Mental Status: She is alert.  Psychiatric:        Mood and Affect: Mood normal.      Musculoskeletal Exam:  Left shoulder held in anterior position, decreased abduction and external rotation range of motion tolerated, tenderness to pressure laterally no  palpable effusion Elbows full ROM no tenderness or swelling Wrists full ROM no tenderness or swelling Fingers full ROM no tenderness or swelling Right ankle tenderness, there is significant overlying edema   Investigation: No additional findings.  Imaging: XR Shoulder Left  Result Date: 10/25/2021 X-ray left shoulder 4 views Humerus appears held in elevated position limiting some viewing angles.  Internal and external rotation appears well-preserved.  Acromiohumeral distance may be decreased.  Mild surface irregularity on glenohumeral joint without any prominent osteophytes or glenoid sclerosis.  AC joint space appears narrowed.  No visible effusions or abnormal calcifications seen. Impression Mild appearing osteoarthritis shoulder with possible narrowing of acromiohumeral distance though may be positional effect  XR Ankle 2 Views Right  Result Date: 10/25/2021 X-ray right ankle 2 views Some possible tibiotalar joint space narrowing or anterior osteophyte process.  Tiny plantar calcaneal enthesophyte.  Mild degenerative appearing changes on superior margin of midfoot joint spaces.  There are a few calcifications present at the lateral ankle.  No erosions seen.  There appears to be soft tissue swelling around the ankle. Impression Osteoarthritis changes in the ankle and midfoot do not appear severe no erosions or demineralization  XR Hand 2 View Left  Result Date: 10/25/2021 X-ray left hand 2 views Radiocarpal joint space appears normal.  There is moderate first CMC joint degenerative changes with bone spurring on trapezium.  MCP PIP and DIP joint spaces appear well-preserved.  There are no erosions or abnormal calcifications seen.  Bone mineralization suggest possible generalized osteopenia. 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  There is moderate first CMC joint degenerative changes with cysts osteophytes minimal subluxation.  MCP PIP and DIP joint spaces all appear well-preserved.  There are no erosions or abnormal calcifications seen.  Bone mineralization suggest possible generalized osteopenia also decreased in the fifth metacarpal bone. Impression First CMC joint osteoarthritis, some generalized osteopenia   Recent Labs: Lab Results  Component Value Date   WBC 6.5 10/07/2021   HGB 11.3 (L) 10/07/2021   PLT 225 10/07/2021   NA 139 10/07/2021   K 4.9 10/07/2021   CL 106 10/07/2021   CO2 25 10/07/2021   GLUCOSE 114 (H) 10/07/2021   BUN 27 (H) 10/07/2021   CREATININE 1.18 (H) 10/07/2021  BILITOT 0.3 10/07/2021   ALKPHOS 81 04/05/2021   AST 15 10/07/2021   ALT 11 10/07/2021   PROT 6.7 10/07/2021   ALBUMIN 3.8 04/05/2021   CALCIUM 8.8 10/07/2021   GFRAA 72 08/05/2019    Speciality Comments: No specialty comments available.  Procedures:  No procedures performed Allergies: Avelox [moxifloxacin hcl in nacl], Montelukast sodium, and Pravastatin   Assessment / Plan:     Visit Diagnoses: Rheumatoid factor positive  Sedimentation rate elevation - Plan: methotrexate (RHEUMATREX) 2.5 MG tablet, predniSONE (DELTASONE) 10 MG tablet  Difficult to confirm seropositive rheumatoid arthritis for sure since there is no frank synovitis or effusions on exam but with elevated inflammatory markers joint pain and stiffness episodic swelling and polyarticular involvement appears very likely.  Reasonable to try conventional DMARD treatment at this time.  She is not interested in intra-articular steroid injection for the shoulder if possible to avoid.  We will treat short-term with oral prednisone for several days and recommend starting methotrexate 15 mg p.o. 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  Positive ANA (antinuclear antibody)  Positive ANA but more specific disease antibody markers  were all negative and no systemic clinical criteria.  No additional work-up or treatment at this time.  Ankle swelling, right  Right ankle swelling all looks consistent with peripheral edema and stasis dermatitis.  She is using a compression sock intermittently.  I do not think this is indicative of an active vasculitis or of significant underlying joint effusion.  High risk medication use  Reviewed previous labs including CBC and metabolic panel and previous hepatitis screening.  She does have mild renal impairment with estimated GFR in the 50s.  No existing liver disease abnormal labs and does not drink alcohol.  Discussed risks of methotrexate treatment including GI intolerance or adverse sensitivity reaction such as mucositis.  Reviewed need for lab monitoring biggest concern in her case would be also polypharmacy and mild renal impairment.  Orders: No orders of the defined types were placed in this encounter.  Meds ordered this encounter  Medications   methotrexate (RHEUMATREX) 2.5 MG tablet    Sig: Take 6 tablets (15 mg total) by mouth once a week. Caution:Chemotherapy. Protect from light.    Dispense:  30 tablet    Refill:  1   predniSONE (DELTASONE) 10 MG tablet    Sig: Take 3 tablets (30 mg total) by mouth daily with breakfast for 2 days, THEN 2 tablets (20 mg total) daily with breakfast for 2 days, THEN 1 tablet (10 mg total) daily with breakfast for 2 days.    Dispense:  12 tablet    Refill:  0     Follow-Up Instructions: Return in about 6 weeks (around 12/06/2021) for ?RA start MTX f/u 4-8wks.   Collier Salina, MD  Note - This record has been created using [employer].  Chart creation errors have been sought, but may not always  have been located. Such creation errors do not reflect on  the standard of medical care.

## 2021-10-20 ENCOUNTER — Other Ambulatory Visit: Payer: Self-pay | Admitting: Medical

## 2021-10-20 ENCOUNTER — Other Ambulatory Visit (HOSPITAL_BASED_OUTPATIENT_CLINIC_OR_DEPARTMENT_OTHER): Payer: Self-pay

## 2021-10-20 MED ORDER — ALPRAZOLAM 0.5 MG PO TABS
0.5000 mg | ORAL_TABLET | Freq: Two times a day (BID) | ORAL | 2 refills | Status: DC | PRN
  Filled 2021-10-20: qty 60, 30d supply, fill #0
  Filled 2021-11-23: qty 60, 30d supply, fill #1
  Filled 2021-12-20: qty 60, 30d supply, fill #2

## 2021-10-20 MED ORDER — AMLODIPINE BESYLATE 2.5 MG PO TABS
2.5000 mg | ORAL_TABLET | Freq: Every day | ORAL | 1 refills | Status: DC
  Filled 2021-10-20: qty 90, 90d supply, fill #0
  Filled 2022-02-16: qty 90, 90d supply, fill #1

## 2021-10-20 NOTE — Telephone Encounter (Addendum)
Requesting: xanax Contract: 03/23/21 UDS: 03/23/21 Last Visit:09/20/21 Next Visit:n/a Last Refill:07/19/21  Please Advise   Refill sent to pharmacy.  Esperanza Richters, PA-C

## 2021-10-21 ENCOUNTER — Other Ambulatory Visit (HOSPITAL_BASED_OUTPATIENT_CLINIC_OR_DEPARTMENT_OTHER): Payer: Self-pay

## 2021-10-25 ENCOUNTER — Other Ambulatory Visit (HOSPITAL_BASED_OUTPATIENT_CLINIC_OR_DEPARTMENT_OTHER): Payer: Self-pay

## 2021-10-25 ENCOUNTER — Encounter: Payer: Self-pay | Admitting: Internal Medicine

## 2021-10-25 ENCOUNTER — Ambulatory Visit: Payer: Medicare Other | Attending: Internal Medicine | Admitting: Internal Medicine

## 2021-10-25 ENCOUNTER — Telehealth: Payer: Self-pay | Admitting: *Deleted

## 2021-10-25 VITALS — BP 138/76 | HR 51 | Resp 13

## 2021-10-25 DIAGNOSIS — R768 Other specified abnormal immunological findings in serum: Secondary | ICD-10-CM | POA: Diagnosis present

## 2021-10-25 DIAGNOSIS — Z79899 Other long term (current) drug therapy: Secondary | ICD-10-CM | POA: Insufficient documentation

## 2021-10-25 DIAGNOSIS — R700 Elevated erythrocyte sedimentation rate: Secondary | ICD-10-CM | POA: Diagnosis present

## 2021-10-25 DIAGNOSIS — M25471 Effusion, right ankle: Secondary | ICD-10-CM | POA: Insufficient documentation

## 2021-10-25 MED ORDER — PREDNISONE 10 MG PO TABS
ORAL_TABLET | ORAL | 0 refills | Status: AC
  Filled 2021-10-25: qty 12, 6d supply, fill #0

## 2021-10-25 MED ORDER — METHOTREXATE 2.5 MG PO TABS
15.0000 mg | ORAL_TABLET | ORAL | 1 refills | Status: DC
  Filled 2021-10-25: qty 30, 35d supply, fill #0

## 2021-10-25 MED ORDER — FOLIC ACID 1 MG PO TABS
1.0000 mg | ORAL_TABLET | Freq: Every day | ORAL | 3 refills | Status: DC
  Filled 2021-10-25: qty 90, 90d supply, fill #0
  Filled 2021-12-20: qty 90, 90d supply, fill #1
  Filled 2022-04-20: qty 90, 90d supply, fill #2
  Filled 2022-07-21: qty 90, 90d supply, fill #3

## 2021-10-25 NOTE — Patient Instructions (Signed)
Methotrexate Tablets What is this medication? METHOTREXATE (METH oh TREX ate) treats inflammatory conditions such as arthritis and psoriasis. It works by decreasing inflammation, which can reduce pain and prevent long-term injury to the joints and skin. It may also be used to treat some types of cancer. It works by slowing down the growth of cancer cells. This medicine may be used for other purposes; ask your health care provider or pharmacist if you have questions. COMMON BRAND NAME(S): Rheumatrex, Trexall What should I tell my care team before I take this medication? They need to know if you have any of these conditions: Fluid in the stomach area or lungs If you often drink alcohol Infection or immune system problems Kidney disease or on hemodialysis Liver disease Low blood counts, like low white cell, platelet, or red cell counts Lung disease Radiation therapy Stomach ulcers Ulcerative colitis An unusual or allergic reaction to methotrexate, other medications, foods, dyes, or preservatives Pregnant or trying to get pregnant Breast-feeding How should I use this medication? Take this medication by mouth with a glass of water. Follow the directions on the prescription label. Take your medication at regular intervals. Do not take it more often than directed. Do not stop taking except on your care team's advice. Make sure you know why you are taking this medication and how often you should take it. If this medication is used for a condition that is not cancer, like arthritis or psoriasis, it should be taken weekly, NOT daily. Taking this medication more often than directed can cause serious side effects, even death. Talk to your care team about safe handling and disposal of this medication. You may need to take special precautions. Talk to your care team about the use of this medication in children. While this medication may be prescribed for selected conditions, precautions do  apply. Overdosage: If you think you have taken too much of this medicine contact a poison control center or emergency room at once. NOTE: This medicine is only for you. Do not share this medicine with others. What if I miss a dose? If you miss a dose, talk with your care team. Do not take double or extra doses. What may interact with this medication? Do not take this medication with any of the following: Acitretin This medication may also interact with the following: Aspirin and aspirin-like medications including salicylates Azathioprine Certain antibiotics like penicillins, tetracycline, and chloramphenicol Certain medications that treat or prevent blood clots like warfarin, apixaban, dabigatran, and rivaroxaban Certain medications for stomach problems like esomeprazole, omeprazole, pantoprazole Cyclosporine Dapsone Diuretics Gold Hydroxychloroquine Live virus vaccines Medications for infection like acyclovir, adefovir, amphotericin B, bacitracin, cidofovir, foscarnet, ganciclovir, gentamicin, pentamidine, vancomycin Mercaptopurine NSAIDs, medications for pain and inflammation, like ibuprofen or naproxen Other cytotoxic agents Pamidronate Pemetrexed Penicillamine Phenylbutazone Phenytoin Probenecid Pyrimethamine Retinoids such as isotretinoin and tretinoin Steroid medications like prednisone or cortisone Sulfonamides like sulfasalazine and trimethoprim/sulfamethoxazole Theophylline Zoledronic acid This list may not describe all possible interactions. Give your health care provider a list of all the medicines, herbs, non-prescription drugs, or dietary supplements you use. Also tell them if you smoke, drink alcohol, or use illegal drugs. Some items may interact with your medicine. What should I watch for while using this medication? Avoid alcoholic drinks. This medication can make you more sensitive to the sun. Keep out of the sun. If you cannot avoid being in the sun, wear  protective clothing and use sunscreen. Do not use sun lamps or tanning beds/booths. You may need   blood work done while you are taking this medication. Call your care team for advice if you get a fever, chills or sore throat, or other symptoms of a cold or flu. Do not treat yourself. This medication decreases your body's ability to fight infections. Try to avoid being around people who are sick. This medication may increase your risk to bruise or bleed. Call your care team if you notice any unusual bleeding. Be careful brushing or flossing your teeth or using a toothpick because you may get an infection or bleed more easily. If you have any dental work done, tell your dentist you are receiving this medication. Check with your care team if you get an attack of severe diarrhea, nausea and vomiting, or if you sweat a lot. The loss of too much body fluid can make it dangerous for you to take this medication. Talk to your care team about your risk of cancer. You may be more at risk for certain types of cancers if you take this medication. Do not become pregnant while taking this medication or for 6 months after stopping it. Women should inform their care team if they wish to become pregnant or think they might be pregnant. Men should not father a child while taking this medication and for 3 months after stopping it. There is potential for serious harm to an unborn child. Talk to your care team for more information. Do not breast-feed an infant while taking this medication or for 1 week after stopping it. This medication may make it more difficult to get pregnant or father a child. Talk to your care team if you are concerned about your fertility. What side effects may I notice from receiving this medication? Side effects that you should report to your care team as soon as possible: Allergic reactions--skin rash, itching, hives, swelling of the face, lips, tongue, or throat Blood clot--pain, swelling, or warmth  in the leg, shortness of breath, chest pain Dry cough, shortness of breath or trouble breathing Infection--fever, chills, cough, sore throat, wounds that don't heal, pain or trouble when passing urine, general feeling of discomfort or being unwell Kidney injury--decrease in the amount of urine, swelling of the ankles, hands, or feet Liver injury--right upper belly pain, loss of appetite, nausea, light-colored stool, dark yellow or brown urine, yellowing of the skin or eyes, unusual weakness or fatigue Low red blood cell count--unusual weakness or fatigue, dizziness, headache, trouble breathing Redness, blistering, peeling, or loosening of the skin, including inside the mouth Seizures Unusual bruising or bleeding Side effects that usually do not require medical attention (report to your care team if they continue or are bothersome): Diarrhea Dizziness Hair loss Nausea Pain, redness, or swelling with sores inside the mouth or throat Vomiting This list may not describe all possible side effects. Call your doctor for medical advice about side effects. You may report side effects to FDA at 1-800-FDA-1088. Where should I keep my medication? Keep out of the reach of children and pets. Store at room temperature between 20 and 25 degrees C (68 and 77 degrees F). Protect from light. Get rid of any unused medication after the expiration date. Talk to your care team about how to dispose of unused medication. Special directions may apply. NOTE: This sheet is a summary. It may not cover all possible information. If you have questions about this medicine, talk to your doctor, pharmacist, or health care provider.  2023 Elsevier/Gold Standard (2007-03-16 00:00:00)  

## 2021-10-25 NOTE — Addendum Note (Signed)
Addended by: Collier Salina on: 10/25/2021 03:15 PM   Modules accepted: Orders

## 2021-10-25 NOTE — Telephone Encounter (Signed)
Patient was seen in the office this morning. Patient states she was started on MTX and during appointment it was discussed that she will need to take Folic Acid. She states she picked up her prescriptions from the pharmacy but she did not receive a prescription for Folic Acid. Please send prescription for Folic Acid to the pharmacy.

## 2021-10-25 NOTE — Telephone Encounter (Signed)
Left message to advise patient a prescription for Folic Acid has been sent to the pharmacy for her. Advised patient to call the office if she has any further questions or concerns.

## 2021-10-25 NOTE — Telephone Encounter (Signed)
When ordering folic acid at our visit med reconciliation indicated already being on this so I did not add it again. But sending new Rx now for folic acid 1 mg daily.

## 2021-10-26 ENCOUNTER — Other Ambulatory Visit (HOSPITAL_BASED_OUTPATIENT_CLINIC_OR_DEPARTMENT_OTHER): Payer: Self-pay

## 2021-10-27 ENCOUNTER — Other Ambulatory Visit (INDEPENDENT_AMBULATORY_CARE_PROVIDER_SITE_OTHER): Payer: Medicare Other

## 2021-10-27 ENCOUNTER — Ambulatory Visit (INDEPENDENT_AMBULATORY_CARE_PROVIDER_SITE_OTHER): Payer: Medicare Other

## 2021-10-27 DIAGNOSIS — Z7901 Long term (current) use of anticoagulants: Secondary | ICD-10-CM | POA: Diagnosis not present

## 2021-10-27 LAB — PROTIME-INR
INR: 3.1 ratio — ABNORMAL HIGH (ref 0.8–1.0)
Prothrombin Time: 32.2 s — ABNORMAL HIGH (ref 9.6–13.1)

## 2021-10-27 LAB — POCT INR: INR: 3.6 — AB (ref 2.0–3.0)

## 2021-10-27 NOTE — Addendum Note (Signed)
Addended by: Anabel Halon on: 10/27/2021 02:41 PM   Modules accepted: Orders

## 2021-10-27 NOTE — Progress Notes (Addendum)
Pt here for INR check per  Goal INR =2.0 to 3.0  Last INR =2.2  Pt currently takes Coumadin 5mg  Wednesday, Thursday, Friday Saturday and Sunday. Coumadin 2.5mg  on Mondays and Tuesday.  Recheck 4 weeks.  Pt denies recent antibiotics, no dietary changes and no unusual bruising / bleeding. She does report taking 3 new medications since Tuesday: Folic acid 1 mg, Methotrexate 2.5 and prednisone 10 mg.     INR today =3.6   Pt advised PCP per Mackie Pai PA, we will get a stat INR at the lab to confirm and he will call her back once we get the results. He will then give her new instructions if indicated.  Rod Holler CMA     Called pt from home afer inr value 3.1 thru lab. Below in " advise given.  Current "regimen Coumadin 5mg  Wednesday, Thursday, Friday Saturday and Sunday. Coumadin 2.5mg  on Mondays and Tuesday.  We discussed this by phone/confirmed. You already took coumadin today. I want you to decrease dose on Friday and Saturday to 2.5 mg daily. Sunday 5 mg. On Monday resume your regular regimen. Follow up next Thursday for repeat inr. I am not in office next week. Please have doctor of the day review."  Pt already notifed and she is following up on Thursday.  Mackie Pai, PA-C

## 2021-11-03 ENCOUNTER — Ambulatory Visit (INDEPENDENT_AMBULATORY_CARE_PROVIDER_SITE_OTHER): Payer: Medicare Other

## 2021-11-03 ENCOUNTER — Other Ambulatory Visit (HOSPITAL_BASED_OUTPATIENT_CLINIC_OR_DEPARTMENT_OTHER): Payer: Self-pay

## 2021-11-03 ENCOUNTER — Other Ambulatory Visit (INDEPENDENT_AMBULATORY_CARE_PROVIDER_SITE_OTHER): Payer: Medicare Other

## 2021-11-03 DIAGNOSIS — Z86718 Personal history of other venous thrombosis and embolism: Secondary | ICD-10-CM | POA: Diagnosis not present

## 2021-11-03 DIAGNOSIS — Z7901 Long term (current) use of anticoagulants: Secondary | ICD-10-CM | POA: Diagnosis not present

## 2021-11-03 LAB — POCT INR: INR: 2.4 (ref 2.0–3.0)

## 2021-11-03 LAB — PROTIME-INR
INR: 2.2 ratio — ABNORMAL HIGH (ref 0.8–1.0)
Prothrombin Time: 22.9 s — ABNORMAL HIGH (ref 9.6–13.1)

## 2021-11-03 MED ORDER — FLUAD QUADRIVALENT 0.5 ML IM PRSY
0.5000 mL | PREFILLED_SYRINGE | Freq: Once | INTRAMUSCULAR | 0 refills | Status: AC
  Filled 2021-11-03: qty 0.5, 1d supply, fill #0

## 2021-11-03 NOTE — Progress Notes (Addendum)
Pt here for INR check per Percell Miller  Goal INR = 2.0-3.0  Last INR = 3.1  On 10/27/21- INR was confirmed via lab draw to be 3.1, per Percell Miller, starting 10/28/21 Pt to take Coumadin 5mg  on Wednesday, Thursday and Sunday. Coumadin 2.5mg  on Friday and Saturday. Then on 10/31/21- return to normal dosing of  5mg  Wednesday, Thursday, Friday Saturday and Sunday. Coumadin 2.5mg  on Mondays and Tuesdays. Recheck today, 11/03/21.   Pt denies recent antibiotics, no dietary changes and no unusual bruising / bleeding.  INR today = 2.4  Pt advised per Dr. Charlett Blake, continue current regimen. Coumadin 5mg  Wednesday, Thursday, Friday Saturday and Sunday. Coumadin 2.5mg  on Mondays and Tuesday. Recheck 4 weeks. Nurse visit scheduled.   Mackie Pai, PA-C

## 2021-11-23 ENCOUNTER — Other Ambulatory Visit (HOSPITAL_BASED_OUTPATIENT_CLINIC_OR_DEPARTMENT_OTHER): Payer: Self-pay

## 2021-11-23 MED ORDER — METHOTREXATE SODIUM 2.5 MG PO TABS
15.0000 mg | ORAL_TABLET | ORAL | 0 refills | Status: DC
  Filled 2021-11-23: qty 30, 35d supply, fill #0

## 2021-11-28 NOTE — Progress Notes (Deleted)
Office Visit Note  Patient: Rita Watkins             Date of Birth: [birth year]           MRN: [id number]             PCP: Elise Benne Referring: Elise Benne Visit Date: 12/06/2021   Subjective:  No chief complaint on file.   History of Present Illness: [name] is a 66 y.o. 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 female here for follow up for evaluation of inflammatory arthritis concern for RA with repeat joint swelling, pain, and increased sedimentation rate and improvement on prednisone. Xrays at last visit showed no erosive disease and mild severity of OA changes. Lab tests were all negative for markers of ANA-related disease process. CBC and CMP okay, does have mild renal impairment. Since our visit she has a flare up of left shoulder pain limiting mobility. Pain radiates into the arm when getting more severe. Her ankle remains the same amount swollen. No new episode of hand swelling.   Previous HPI 10/06/2021 [name] is a 66 y.o. 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 She developed pain and swelling involving ankle and in right 2nd finger that was treated with prednisone taper with good response in PCP office. Lab testing positive for RF and elevated uric acid level. She has had joint pain in multiple areas but symptoms significantly increased since earlier this year.  In May she developed severe pain involving the left shoulder and right hand particularly her second finger.  She saw significant swelling in the hands and around the right ankle.  She was treated with a oral prednisone course lasting 6 days and the symptoms stayed improved for a little over 1 month.  She had a recurrence of joint swelling in July that also improved quickly with oral prednisone.  Due to the swelling and  warmth around the ankle she is been treated with oral antibiotics that did not significantly affect her symptoms. 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  Was complicated by ultimately developing sepsis requiring hospital visit and prolonged nursing care treatment course.   Labs reviewed 06/2021 ANA 1:80 cytoplasmic reticular/AMA RF 40 Uric acid 8.3 ESR 33 CPR <1   Activities of Daily Living:  Patient reports morning stiffness for 10 minutes.   Patient Reports nocturnal pain.  Difficulty dressing/grooming: Reports Difficulty climbing stairs: Reports Difficulty getting out of chair: Reports Difficulty using hands for taps, buttons, cutlery, and/or writing: Reports   No Rheumatology ROS completed.   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   Peripheral vascular disease ([place]) 08/19/2016  Deep vein thrombosis (DVT) of popliteal vein of both lower extremities (HCC) 08/18/2016   Pulmonary embolism without acute cor pulmonale ([place]) 08/18/2016   Tobacco use 08/18/2016   Open leg wound, left, sequela 08/17/2016   Surgical wound, non healing 08/16/2016   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  CT abdomen 7/17:   1.  Large left retroperitoneal hematoma with mixed density blood products.  Assessment for active bleeding is not possible without IV contrast. 2.  Contrast in the renal collecting systems presumably from contrast enhanced studies performed on August 20, 2016 consistent with known impaired renal function.   CT abdomen 7/27:    Anxiety    Benign essential HTN 08/14/2016   Cardiac murmur 04/20/2020   Cigarette smoker 07/15/2014   COPD (chronic obstructive pulmonary disease) (HCC)    Deep vein thrombosis (DVT) of popliteal vein of both lower extremities ([place]) 08/18/2016   Last Assessment & Plan:  Formatting of this note might be different from the original. Not clear if provoked/unprovoked (pt had trauma to left toe back in 03/2016 with some decreased mobility thereafter) CT chest confirmed acute PE and also concerning for some bilateral upper lobe groundglass opacities Hematology on board and heparin drip started but  discontinued 7/17  2/2 large retroperitoneal he   Depression    Depression with anxiety 09/13/2012   Dizziness 07/19/2016   Essential hypertension 04/20/2020   History of pulmonary embolism 04/20/2020   Hyperlipidemia    Hypertension    Leukocytosis 07/30/2014   Lymphangitis 06/07/2016   Mycobacterial infection 06/07/2016   Mycobacterium infection, 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 Per dermatology: Please provide the patient with our outpatient clinic phone number [phone]) to call and  schedule a follow up appointment for continued management.   Last Assessment & Plan:  Formatting of this note might be different from the original. Uncertain etiology Vasculitic vs infectious vs malignancy Extensive work up has   Obesity 09/20/2016   Last Assessment & Plan:  Formatting of this note might be different from the original. Body mass index is 22.67 kg/m.  - For weight loss counseling.   Obesity (BMI 30.0-34.9) 04/20/2020   Open leg wound, left, sequela 08/17/2016   Formatting of this note might be different from the original. - Chronic left wound starting in April 2018 and worsening despite antibiotics and debridement -Patient had multiple hospitalizations and was treated with steroids and antibiotics with worsening/extension of the wound - ESR 103, CRP 168 - ANA, factor V [place], Antithrombin III, Cardiolite and antibody IgG, C3 and C4 complement proteinase   Peripheral vascular disease ([place]) 08/19/2016   Last Assessment & Plan:  Formatting of this note might be different from the original. Bilateral popliteal DVT seen on admission Occlusion of the left common iliac with collateral blood flow to common femoral artery LLE angiogram on 08/22/16 for attempt at revascularization to optimize blood flow for healing Left AKA 7/24 I&D 7/31  and 8/2 with further procedures planned.  ASA 81 mg daily PT consult   Pulmonary embolism without acute cor pulmonale (HCC) 08/18/2016   Last Assessment & Plan:  Formatting of this note might be different from the original. 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 Added automatically from request for surgery  [id number]  Last Assessment & Plan:  Formatting of this note might be different from the original. Presented with large area of necrotic tissue LLE Bilateral popliteal DVT Etiology uncertain:  Ischemic vs vasculitis vs autoimmune vs possible occult malignancy Bx: leukocytoblastic vasculopathy  In   Tobacco use 08/18/2016   Last Assessment & Plan:  Formatting of this note might be different from the original. Patient has a 30+ pack year history of smoking Discussed low dose CT chest for cancer screening at admission, patient open to this but concerned about extra cost as she has no insurance at this time. No obvious suspicious lesions noted on CTA PE study.  Counseled regarding tobacco cessation    Family History  Problem Relation Age of Onset   Hypertension Mother    Hyperlipidemia Mother    Osteoarthritis Mother    Rheum arthritis Mother    Hypertension Brother    Asthma Brother    Osteoarthritis Brother    Past Surgical History:  Procedure Laterality Date   FOOT SURGERY     Left   HIP PINNING     3 each hip   IRRIGATION AND DEBRIDEMENT ABSCESS N/A 06/27/2016   Procedure: IRRIGATION AND DEBRIDEMENT LEFT LEG WITH SOTFT TISSUE BIOPSY;  Surgeon: CClovis Riley MD;  Location: [facility];  Service: General;  Laterality: N/A;   LEG AMPUTATION Left    LYMPH NODE BIOPSY Left 06/09/2016   Procedure: BIOPSY LEFT LEG;  Surgeon: DWallace Going DO;  Location: [facility];  Service: Plastics;  Laterality: Left;   SLIPPED CAPITAL FEMORAL EPIPHYSIS PINNING     Social History   Social History Narrative   Does not exercise.   Immunization History  Administered Date(s) Administered   Fluad Quad(high Dose 65+) 12/18/2018, 11/01/2020, 11/03/2021   Influenza Split 11/20/2013   Influenza Whole 11/10/2012   Influenza, High Dose Seasonal PF 11/29/2015   Influenza,inj,Quad PF,6+ Mos 10/29/2014, 11/15/2017   PFIZER Comirnaty(Gray Top)Covid-19 Tri-Sucrose Vaccine 05/19/2020   PFIZER(Purple Top)SARS-COV-2  Vaccination 04/20/2019, 05/10/2019   Pfizer Covid-19 Vaccine Bivalent Booster 116yr& up 11/01/2020   Pneumococcal Conjugate-13 12/18/2018   Pneumococcal Polysaccharide-23 09/14/2011   Tdap 04/09/2008     Objective: Vital Signs: There were no vitals taken for this visit.   Physical Exam   Musculoskeletal Exam: ***  CDAI Exam: CDAI Score: -- Patient Global: --; Provider Global: -- Swollen: --; Tender: -- Joint Exam 12/06/2021   No joint exam has been documented for this visit   There is currently no information documented on the homunculus. Go to the Rheumatology activity and complete the homunculus joint exam.  Investigation: No additional findings.  Imaging: No results found.  Recent Labs: Lab Results  Component Value Date   WBC 6.5 10/07/2021   HGB 11.3 (L) 10/07/2021   PLT 225 10/07/2021  NA 139 10/07/2021   K 4.9 10/07/2021   CL 106 10/07/2021   CO2 25 10/07/2021   GLUCOSE 114 (H) 10/07/2021   BUN 27 (H) 10/07/2021   CREATININE 1.18 (H) 10/07/2021   BILITOT 0.3 10/07/2021   ALKPHOS 81 04/05/2021   AST 15 10/07/2021   ALT 11 10/07/2021   PROT 6.7 10/07/2021   ALBUMIN 3.8 04/05/2021   CALCIUM 8.8 10/07/2021   GFRAA 72 08/05/2019    Speciality Comments: No specialty comments available.  Procedures:  No procedures performed Allergies: Avelox [moxifloxacin hcl in nacl], Montelukast sodium, and Pravastatin   Assessment / Plan:     Visit Diagnoses: No diagnosis found.  ***  Orders: No orders of the defined types were placed in this encounter.  No orders of the defined types were placed in this encounter.    Follow-Up Instructions: No follow-ups on file.   Bertram Savin, RT  Note - This record has been created using Editor, commissioning.  Chart creation errors have been sought, but may not always  have been located. Such creation errors do not reflect on  the standard of medical care.

## 2021-12-02 NOTE — Progress Notes (Signed)
Office Visit Note  Patient: Rita Watkins             Date of Birth: October 06, 1955           MRN: [id number]             PCP: Elise Benne Referring: Elise Benne Visit Date: 12/08/2021   Subjective:  Follow-up (Patient states that medication is working well.)   History of Present Illness: Rita Watkins is a 66 y.o. female here for follow up for evaluation of inflammatory arthritis concern for RA with repeat joint swelling, pain, and increased sedimentation rate. Since starting the methotrexate 15 mg PO weekly she feels a good improvement in symptoms with almost no morning stiffness and no flare ups. Shoulder and hand pains are better. Still has about the same degree of leg swelling and redness on the lateral side. She does notice some headache after taking each weekly dose but it lasts less than one day duration. She notices occasional shoulder pain provoked with lifting cases of water.  Previous HPI 10/25/2021 Rita Watkins is a 66 y.o. female here for follow up for evaluation of inflammatory arthritis concern for RA with repeat joint swelling, pain, and increased sedimentation rate and improvement on prednisone. Xrays at last visit showed no erosive disease and mild severity of OA changes. Lab tests were all negative for markers of ANA-related disease process. CBC and CMP okay, does have mild renal impairment. Since our visit she has a flare up of left shoulder pain limiting mobility. Pain radiates into the arm when getting more severe. Her ankle remains the same amount swollen. No new episode of hand swelling.   Previous HPI 10/06/2021 Rita Watkins is a 66 y.o. 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 She developed pain and swelling involving ankle and in right 2nd finger that was treated with prednisone taper with good response in PCP office. Lab testing positive for RF and elevated uric acid level. She has had joint pain in multiple areas but symptoms  significantly increased since earlier this year.  In May she developed severe pain involving the left shoulder and right hand particularly her second finger.  She saw significant swelling in the hands and around the right ankle.  She was treated with a oral prednisone course lasting 6 days and the symptoms stayed improved for a little over 1 month.  She had a recurrence of joint swelling in July that also improved quickly with oral prednisone.  Due to the swelling and warmth around the ankle she is been treated with oral antibiotics that did not significantly affect her symptoms. 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  Was complicated by ultimately developing sepsis requiring hospital visit and prolonged nursing care treatment course.   Labs reviewed 06/2021 ANA 1:80 cytoplasmic reticular/AMA RF 40 Uric acid 8.3 ESR 33 CPR <1   Activities of Daily Living:  Patient reports morning stiffness for 10 minutes.   Patient Reports nocturnal pain.  Difficulty dressing/grooming: Reports Difficulty climbing stairs: Reports Difficulty getting out of chair: Reports Difficulty using hands for taps, buttons, cutlery, and/or writing: Reports   Review of Systems  Constitutional:  Positive for fatigue.  HENT:  Negative for mouth sores and mouth dryness.   Eyes:  Negative for dryness.  Respiratory:  Negative for shortness of breath.  Cardiovascular:  Negative for chest pain and palpitations.  Gastrointestinal:  Negative for blood in stool, constipation and diarrhea.  Endocrine: Negative for increased urination.  Genitourinary:  Negative for involuntary urination.  Musculoskeletal:  Positive for gait problem and morning stiffness. Negative for joint pain, joint pain, joint swelling, myalgias, muscle weakness, muscle  tenderness and myalgias.  Skin:  Positive for sensitivity to sunlight. Negative for color change, rash and hair loss.  Allergic/Immunologic: Negative for susceptible to infections.  Neurological:  Positive for headaches. Negative for dizziness.  Hematological:  Negative for swollen glands.  Psychiatric/Behavioral:  Negative for depressed mood and sleep disturbance. The patient is not nervous/anxious.     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   Peripheral vascular disease ([place]) 08/19/2016   Deep vein thrombosis (DVT) of popliteal vein of both lower extremities ([place]) 08/18/2016   Pulmonary embolism without acute cor pulmonale ([place]) 08/18/2016   Tobacco use 08/18/2016   Open leg wound, left, sequela 08/17/2016   Surgical wound, non healing 08/16/2016   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  CT abdomen 7/17:   1.  Large left retroperitoneal hematoma with mixed density blood products.  Assessment for active bleeding is not possible without IV  contrast. 2.  Contrast in the renal collecting systems presumably from contrast enhanced studies performed on August 20, 2016 consistent with known impaired renal function.   CT abdomen 7/27:    Anxiety    Benign essential HTN 08/14/2016   Cardiac murmur 04/20/2020   Cigarette smoker 07/15/2014   COPD (chronic obstructive pulmonary disease) (HCC)    Deep vein thrombosis (DVT) of popliteal vein of both lower extremities ([place]) 08/18/2016   Last Assessment & Plan:  Formatting of this note might be different from the original. Not clear if provoked/unprovoked (pt had trauma to left toe back in 03/2016 with some decreased mobility thereafter) CT chest confirmed acute PE and also concerning for some bilateral upper lobe groundglass opacities Hematology on board and heparin drip started but  discontinued 7/17  2/2 large retroperitoneal he   Depression    Depression with anxiety 09/13/2012   Dizziness 07/19/2016   Essential hypertension 04/20/2020   History of pulmonary embolism 04/20/2020   Hyperlipidemia    Hypertension    Leukocytosis 07/30/2014   Lymphangitis 06/07/2016   Mycobacterial infection 06/07/2016   Mycobacterium infection, 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 Per dermatology: Please provide  the patient with our outpatient clinic phone number [phone]) to call and schedule a follow up appointment for continued management.   Last Assessment & Plan:  Formatting of this note might be different from the original. Uncertain etiology Vasculitic vs infectious vs malignancy Extensive work up has   Obesity 09/20/2016   Last Assessment & Plan:  Formatting of this note might be different from the original. Body mass index is 22.67 kg/m.  - For weight loss counseling.   Obesity (BMI 30.0-34.9) 04/20/2020   Open leg wound, left, sequela 08/17/2016   Formatting  of this note might be different from the original. - Chronic left wound starting in April 2018 and worsening despite antibiotics and debridement -Patient had multiple hospitalizations and was treated with steroids and antibiotics with worsening/extension of the wound - ESR 103, CRP 168 - ANA, factor V [place], Antithrombin III, Cardiolite and antibody IgG, C3 and C4 complement proteinase   Peripheral vascular disease ([place]) 08/19/2016   Last Assessment & Plan:  Formatting of this note might be different from the original. 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  ASA 81 mg daily PT consult   Pulmonary embolism without acute cor pulmonale (HCC) 08/18/2016   Last Assessment & Plan:  Formatting of this note might be different from the original. 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 Added automatically from request for surgery [id number]  Last Assessment & Plan:  Formatting of this note might be different from the original. Presented with large area of necrotic tissue LLE Bilateral popliteal DVT Etiology uncertain:  Ischemic vs vasculitis vs autoimmune vs possible occult malignancy Bx: leukocytoblastic vasculopathy  In   Tobacco use 08/18/2016   Last Assessment & Plan:  Formatting of this note might be different from the original. Patient has a 30+ pack year history of smoking Discussed low dose CT chest for cancer  screening at admission, patient open to this but concerned about extra cost as she has no insurance at this time. No obvious suspicious lesions noted on CTA PE study.  Counseled regarding tobacco cessation    Family History  Problem Relation Age of Onset   Hypertension Mother    Hyperlipidemia Mother    Osteoarthritis Mother    Rheum arthritis Mother    Hypertension Brother    Asthma Brother    Osteoarthritis Brother    Past Surgical History:  Procedure Laterality Date   FOOT SURGERY     Left   HIP PINNING     3 each hip   IRRIGATION AND DEBRIDEMENT ABSCESS N/A 06/27/2016   Procedure: IRRIGATION AND DEBRIDEMENT LEFT LEG WITH SOTFT TISSUE BIOPSY;  Surgeon: CClovis Riley MD;  Location: [facility];  Service: General;  Laterality: N/A;   LEG AMPUTATION Left    LYMPH NODE BIOPSY Left 06/09/2016   Procedure: BIOPSY LEFT LEG;  Surgeon: DWallace Going DO;  Location: [facility];  Service:  Plastics;  Laterality: Left;   SLIPPED CAPITAL FEMORAL EPIPHYSIS PINNING     Social History   Social History Narrative   Does not exercise.   Immunization History  Administered Date(s) Administered   COVID-19, mRNA, vaccine(Comirnaty)12 years and older 12/07/2021   Fluad Quad(high Dose 65+) 12/18/2018, 11/01/2020, 11/03/2021   Influenza Split 11/20/2013   Influenza Whole 11/10/2012   Influenza, High Dose Seasonal PF 11/29/2015   Influenza,inj,Quad PF,6+ Mos 10/29/2014, 11/15/2017   PFIZER Comirnaty(Gray Top)Covid-19 Tri-Sucrose Vaccine 05/19/2020   PFIZER(Purple Top)SARS-COV-2 Vaccination 04/20/2019, 05/10/2019   Pfizer Covid-19 Vaccine Bivalent Booster 26yr & up 11/01/2020   Pneumococcal Conjugate-13 12/18/2018   Pneumococcal Polysaccharide-23 09/14/2011   Tdap 04/09/2008     Objective: Vital Signs: BP 132/75 (BP Location: Right Arm, Patient Position: Sitting, Cuff Size: Normal)   Pulse (!) 59   Resp 13    Physical Exam Constitutional:      Appearance: She is obese.   Cardiovascular:     Rate and Rhythm: Normal rate and regular rhythm.  Pulmonary:     Effort: Pulmonary effort is normal.     Breath sounds: Normal breath sounds.  Musculoskeletal:     Right lower leg: Edema present.  Skin:    General: Skin is warm and dry.     Findings: Rash present.  Neurological:     Mental Status: She is alert.      Musculoskeletal Exam:  Neck full ROM no tenderness Shoulders full ROM no tenderness or swelling Elbows full ROM no tenderness or swelling Wrists full ROM no tenderness or swelling, chronic soft tissue swelling or possible overlying edema Fingers full ROM no tenderness or swelling, chronic right MCP joint thickening Right knee full ROM no tenderness or swelling   CDAI Exam: CDAI Score: 2  Patient Global: 10 mm; Provider Global: 10 mm Swollen: 0 ; Tender: 0  Joint Exam 12/08/2021   All documented joints were normal     Investigation: No additional findings.  Imaging: No results found.  Recent Labs: Lab Results  Component Value Date   WBC 6.7 12/08/2021   HGB 10.8 (L) 12/08/2021   PLT 306 12/08/2021   NA 142 12/08/2021   K 4.8 12/08/2021   CL 107 12/08/2021   CO2 27 12/08/2021   GLUCOSE 81 12/08/2021   BUN 18 12/08/2021   CREATININE 1.17 (H) 12/08/2021   BILITOT 0.4 12/08/2021   ALKPHOS 81 04/05/2021   AST 21 12/08/2021   ALT 14 12/08/2021   PROT 6.9 12/08/2021   ALBUMIN 3.8 04/05/2021   CALCIUM 8.6 12/08/2021   GFRAA 72 08/05/2019    Speciality Comments: No specialty comments available.  Procedures:  No procedures performed Allergies: Avelox [moxifloxacin hcl in nacl], Montelukast sodium, and Pravastatin   Assessment / Plan:     Visit Diagnoses: Rheumatoid factor positive - Plan: Sedimentation rate, methotrexate (RHEUMATREX) 2.5 MG tablet  Symptoms appear substantially improved today already having completed the prednisone just on methotrexate 15 mg p.o. weekly folic acid 1 mg daily.  Rechecking sedimentation rate  see if this correlates with disease activity assessment.  Plan to continue the current treatment assuming no lab abnormalities requiring adjustment.  Ankle swelling, right  Edema appears about the same compared to our last visit still with some warmth and erythema on the lateral aspect.  No new lesions or evidence of skin inflammation or breakdown compared to prior.  High risk medication use - methotrexate 6 tablets (15 mg total) by mouth once a week, prednisone 3 tablets (  30 mg total) by mouth daily - Plan: CBC with Differential/Platelet, COMPLETE METABOLIC PANEL WITH GFR  Checking CBC and CMP for methotrexate toxicity monitoring.  Particularly with a mildly impaired estimated GFR but otherwise she is tolerating the medication very well.  Orders: Orders Placed This Encounter  Procedures   Sedimentation rate   CBC with Differential/Platelet   COMPLETE METABOLIC PANEL WITH GFR   Meds ordered this encounter  Medications   methotrexate (RHEUMATREX) 2.5 MG tablet    Sig: Take 6 tablets (15 mg total) by mouth once a week. Caution:Chemotherapy. Protect from light.    Dispense:  78 tablet    Refill:  0     Follow-Up Instructions: Return in about 3 months (around 03/10/2022) for RA on MTX f/u 14mo.   CCollier Salina MD  Note - This record has been created using [employer]  Chart creation errors have been sought, but may not always  have been located. Such creation errors do not reflect on  the standard of medical care.

## 2021-12-06 ENCOUNTER — Ambulatory Visit: Payer: Medicare Other | Admitting: Internal Medicine

## 2021-12-06 DIAGNOSIS — R700 Elevated erythrocyte sedimentation rate: Secondary | ICD-10-CM

## 2021-12-06 DIAGNOSIS — R768 Other specified abnormal immunological findings in serum: Secondary | ICD-10-CM

## 2021-12-06 DIAGNOSIS — M25471 Effusion, right ankle: Secondary | ICD-10-CM

## 2021-12-06 DIAGNOSIS — Z79899 Other long term (current) drug therapy: Secondary | ICD-10-CM

## 2021-12-07 ENCOUNTER — Ambulatory Visit (INDEPENDENT_AMBULATORY_CARE_PROVIDER_SITE_OTHER): Payer: Medicare Other

## 2021-12-07 ENCOUNTER — Other Ambulatory Visit (HOSPITAL_BASED_OUTPATIENT_CLINIC_OR_DEPARTMENT_OTHER): Payer: Self-pay

## 2021-12-07 DIAGNOSIS — Z7901 Long term (current) use of anticoagulants: Secondary | ICD-10-CM | POA: Diagnosis not present

## 2021-12-07 LAB — POCT INR: INR: 2.9 (ref 2.0–3.0)

## 2021-12-07 MED ORDER — AREXVY 120 MCG/0.5ML IM SUSR
INTRAMUSCULAR | 0 refills | Status: DC
  Filled 2021-12-07: qty 1, 1d supply, fill #0

## 2021-12-07 MED ORDER — COMIRNATY 30 MCG/0.3ML IM SUSY
PREFILLED_SYRINGE | INTRAMUSCULAR | 0 refills | Status: DC
  Filled 2021-12-07: qty 0.3, 1d supply, fill #0

## 2021-12-07 NOTE — Progress Notes (Addendum)
Pt here for INR check per Saguier  Goal INR = 2.0 to 3.0  Last INR = 2.4  Pt currently takes Coumadin 5mg  Wednesday, Thursday, Friday Saturday and Sunday. Coumadin 2.5mg  on Mondays and Tuesday.  Pt denies recent antibiotics, no dietary changes and no unusual bruising / bleeding.  INR today = 2.9  Pt advised per [place] to continue current regime and f/u in 4 weeks. Pt scheduled.  Mackie Pai, PA-C

## 2021-12-08 ENCOUNTER — Encounter: Payer: Self-pay | Admitting: Internal Medicine

## 2021-12-08 ENCOUNTER — Ambulatory Visit: Payer: Medicare Other | Attending: Internal Medicine | Admitting: Internal Medicine

## 2021-12-08 VITALS — BP 132/75 | HR 59 | Resp 13

## 2021-12-08 DIAGNOSIS — R768 Other specified abnormal immunological findings in serum: Secondary | ICD-10-CM | POA: Diagnosis present

## 2021-12-08 DIAGNOSIS — R700 Elevated erythrocyte sedimentation rate: Secondary | ICD-10-CM | POA: Diagnosis present

## 2021-12-08 DIAGNOSIS — M25471 Effusion, right ankle: Secondary | ICD-10-CM | POA: Diagnosis present

## 2021-12-08 DIAGNOSIS — Z79899 Other long term (current) drug therapy: Secondary | ICD-10-CM | POA: Diagnosis present

## 2021-12-09 ENCOUNTER — Other Ambulatory Visit (HOSPITAL_BASED_OUTPATIENT_CLINIC_OR_DEPARTMENT_OTHER): Payer: Self-pay

## 2021-12-09 LAB — CBC WITH DIFFERENTIAL/PLATELET
Absolute Monocytes: 516 cells/uL (ref 200–950)
Basophils Absolute: 40 cells/uL (ref 0–200)
Basophils Relative: 0.6 %
Eosinophils Absolute: 161 cells/uL (ref 15–500)
Eosinophils Relative: 2.4 %
HCT: 32.9 % — ABNORMAL LOW (ref 35.0–45.0)
Hemoglobin: 10.8 g/dL — ABNORMAL LOW (ref 11.7–15.5)
Lymphs Abs: 1561 cells/uL (ref 850–3900)
MCH: 29.7 pg (ref 27.0–33.0)
MCHC: 32.8 g/dL (ref 32.0–36.0)
MCV: 90.4 fL (ref 80.0–100.0)
MPV: 10 fL (ref 7.5–12.5)
Monocytes Relative: 7.7 %
Neutro Abs: 4422 cells/uL (ref 1500–7800)
Neutrophils Relative %: 66 %
Platelets: 306 10*3/uL (ref 140–400)
RBC: 3.64 10*6/uL — ABNORMAL LOW (ref 3.80–5.10)
RDW: 14.3 % (ref 11.0–15.0)
Total Lymphocyte: 23.3 %
WBC: 6.7 10*3/uL (ref 3.8–10.8)

## 2021-12-09 LAB — COMPLETE METABOLIC PANEL WITH GFR
AG Ratio: 1.2 (calc) (ref 1.0–2.5)
ALT: 14 U/L (ref 6–29)
AST: 21 U/L (ref 10–35)
Albumin: 3.7 g/dL (ref 3.6–5.1)
Alkaline phosphatase (APISO): 73 U/L (ref 37–153)
BUN/Creatinine Ratio: 15 (calc) (ref 6–22)
BUN: 18 mg/dL (ref 7–25)
CO2: 27 mmol/L (ref 20–32)
Calcium: 8.6 mg/dL (ref 8.6–10.4)
Chloride: 107 mmol/L (ref 98–110)
Creat: 1.17 mg/dL — ABNORMAL HIGH (ref 0.50–1.05)
Globulin: 3.2 g/dL (calc) (ref 1.9–3.7)
Glucose, Bld: 81 mg/dL (ref 65–99)
Potassium: 4.8 mmol/L (ref 3.5–5.3)
Sodium: 142 mmol/L (ref 135–146)
Total Bilirubin: 0.4 mg/dL (ref 0.2–1.2)
Total Protein: 6.9 g/dL (ref 6.1–8.1)
eGFR: 51 mL/min/{1.73_m2} — ABNORMAL LOW (ref >=60)

## 2021-12-09 LAB — SEDIMENTATION RATE: Sed Rate: 106 mm/h — ABNORMAL HIGH (ref 0–30)

## 2021-12-09 MED ORDER — METHOTREXATE SODIUM 2.5 MG PO TABS
15.0000 mg | ORAL_TABLET | ORAL | 0 refills | Status: DC
  Filled 2021-12-09 – 2021-12-20 (×2): qty 78, 91d supply, fill #0

## 2021-12-09 NOTE — Progress Notes (Signed)
Lab result looks okay for continuing the methotrexate no problem with blood count or kidney or liver function. Her sedimentation rate remains quite high but with symptom improvement I don't recommend change in medications at this time.

## 2021-12-20 ENCOUNTER — Other Ambulatory Visit (HOSPITAL_COMMUNITY): Payer: Self-pay

## 2021-12-20 ENCOUNTER — Other Ambulatory Visit (HOSPITAL_BASED_OUTPATIENT_CLINIC_OR_DEPARTMENT_OTHER): Payer: Self-pay

## 2021-12-20 ENCOUNTER — Other Ambulatory Visit: Payer: Self-pay | Admitting: Medical

## 2021-12-20 MED ORDER — BENAZEPRIL HCL 20 MG PO TABS
20.0000 mg | ORAL_TABLET | Freq: Two times a day (BID) | ORAL | 2 refills | Status: DC
  Filled 2021-12-20: qty 60, 30d supply, fill #0
  Filled 2021-12-20: qty 2, 1d supply, fill #0
  Filled 2021-12-20: qty 58, 29d supply, fill #0
  Filled 2022-01-17: qty 60, 30d supply, fill #1
  Filled 2022-02-16: qty 60, 30d supply, fill #2

## 2021-12-20 MED ORDER — GABAPENTIN 300 MG PO CAPS
300.0000 mg | ORAL_CAPSULE | Freq: Every day | ORAL | 2 refills | Status: DC
  Filled 2021-12-20: qty 30, 30d supply, fill #0
  Filled 2022-01-17: qty 30, 30d supply, fill #1
  Filled 2022-02-16: qty 30, 30d supply, fill #2

## 2022-01-04 ENCOUNTER — Ambulatory Visit: Payer: Medicare Other

## 2022-01-10 ENCOUNTER — Ambulatory Visit (INDEPENDENT_AMBULATORY_CARE_PROVIDER_SITE_OTHER): Payer: Medicare Other

## 2022-01-10 ENCOUNTER — Telehealth: Payer: Self-pay | Admitting: Medical

## 2022-01-10 DIAGNOSIS — Z7901 Long term (current) use of anticoagulants: Secondary | ICD-10-CM

## 2022-01-10 LAB — POCT INR: POC INR: 4

## 2022-01-10 NOTE — Telephone Encounter (Signed)
Placed future inr to be done next week thru our lab on wed. She will call and schedule the appointment.  Esperanza Richters, PA-C

## 2022-01-10 NOTE — Progress Notes (Addendum)
Pt here for INR check per Saguier   Goal INR = 2.0 to 3.0   Last INR = 2.9   Pt currently takes Coumadin 5mg  Wednesday, Thursday, Friday Saturday and Sunday. Coumadin 2.5mg  on Mondays and Tuesday.   Pt denies recent antibiotics, no dietary changes and no unusual bruising / bleeding. --She did say she had some collard greens on thanksgiving.  She asked if her RA medications played any effects on her INR, she takes 6 tabs each Tuesday.   INR today = 4.0   Pt advised per Friday to hold the next two doses and then resume current regimen and f/u in 1 weeks. Pt scheduled.   Ramon Dredge, PA-C   Pt clarified that she did take coumadin 2.5 mg today. She will hold coumadin dose on wed and Thursday and then resume. She will follow up on Wednesday.

## 2022-01-17 ENCOUNTER — Other Ambulatory Visit (HOSPITAL_BASED_OUTPATIENT_CLINIC_OR_DEPARTMENT_OTHER): Payer: Self-pay

## 2022-01-17 ENCOUNTER — Other Ambulatory Visit: Payer: Self-pay | Admitting: Medical

## 2022-01-17 NOTE — Telephone Encounter (Signed)
Requesting: alprazolam 0.5mg   Contract: 03/23/21 UDS:03/23/21 Last Visit: 08/25/21 Next Visit: None  Last Refill: 10/20/21 #60 and 0RF   Please Advise

## 2022-01-18 ENCOUNTER — Encounter: Payer: Self-pay | Admitting: Medical

## 2022-01-18 ENCOUNTER — Ambulatory Visit: Payer: Medicare Other

## 2022-01-18 ENCOUNTER — Other Ambulatory Visit (HOSPITAL_BASED_OUTPATIENT_CLINIC_OR_DEPARTMENT_OTHER): Payer: Self-pay

## 2022-01-18 ENCOUNTER — Other Ambulatory Visit: Payer: Self-pay

## 2022-01-18 ENCOUNTER — Other Ambulatory Visit (INDEPENDENT_AMBULATORY_CARE_PROVIDER_SITE_OTHER): Payer: Medicare Other

## 2022-01-18 DIAGNOSIS — Z7901 Long term (current) use of anticoagulants: Secondary | ICD-10-CM | POA: Diagnosis not present

## 2022-01-18 LAB — PROTIME-INR
INR: 3.6 ratio — ABNORMAL HIGH (ref 0.8–1.0)
Prothrombin Time: 36.6 s — ABNORMAL HIGH (ref 9.6–13.1)

## 2022-01-18 MED ORDER — ALPRAZOLAM 0.5 MG PO TABS
0.5000 mg | ORAL_TABLET | Freq: Two times a day (BID) | ORAL | 1 refills | Status: DC | PRN
  Filled 2022-01-18: qty 60, 30d supply, fill #0
  Filled 2022-02-16: qty 60, 30d supply, fill #1

## 2022-01-18 NOTE — Telephone Encounter (Signed)
Rx xanax sent to pharmacy. Received refill request yesterday.   Esperanza Richters, PA-C

## 2022-01-18 NOTE — Addendum Note (Signed)
Addended by: Gwenevere Abbot on: 01/18/2022 05:29 PM   Modules accepted: Orders

## 2022-01-25 ENCOUNTER — Other Ambulatory Visit (INDEPENDENT_AMBULATORY_CARE_PROVIDER_SITE_OTHER): Payer: Medicare Other

## 2022-01-25 DIAGNOSIS — Z7901 Long term (current) use of anticoagulants: Secondary | ICD-10-CM

## 2022-01-25 LAB — PROTIME-INR
INR: 1.7 ratio — ABNORMAL HIGH (ref 0.8–1.0)
Prothrombin Time: 18.5 s — ABNORMAL HIGH (ref 9.6–13.1)

## 2022-01-26 ENCOUNTER — Telehealth: Payer: Self-pay

## 2022-01-26 NOTE — Telephone Encounter (Signed)
LVM

## 2022-01-26 NOTE — Telephone Encounter (Signed)
Pt reports her current dosing is 1/2 tablet (2.5 mg) daily except take 1 tablet (5 mg) on Wednesdays. INR yesterday was 1.7  Pt would like to go to [facility]. Gave pt number to coumadin clinic. Advised it would be best to wait 2 weeks to be tested again to see if the dosing she is on is working. Pt would like to know if she should cancel her lab apt at [facility] on Wednesday of next week for INR check. Advised a msg would be sent to PCP and also the other coumadin clinic nurse, Barbara Cower. Will advise provider cancel lab apt for INR check next week and pt will start coumadin clinic on 02/07/22 at [facility]. She would like to be notified if she can cancel the lab apt on Wed. Advised either PCP's office would contact her or Barbara Cower to let her know if provider is ok with cancelling lab apt.

## 2022-01-26 NOTE — Telephone Encounter (Signed)
Rita Watkins would like for this patient to be referred to the coumadin clinic

## 2022-01-26 NOTE — Addendum Note (Signed)
Addended by: Gwenevere Abbot on: 01/26/2022 06:57 AM   Modules accepted: Orders

## 2022-01-27 NOTE — Telephone Encounter (Signed)
Pt called and notified of cancellation of lab appt

## 2022-02-01 ENCOUNTER — Other Ambulatory Visit: Payer: Medicare Other

## 2022-02-07 ENCOUNTER — Ambulatory Visit (INDEPENDENT_AMBULATORY_CARE_PROVIDER_SITE_OTHER): Payer: Medicare Other

## 2022-02-07 DIAGNOSIS — Z7901 Long term (current) use of anticoagulants: Secondary | ICD-10-CM | POA: Diagnosis not present

## 2022-02-07 LAB — POCT INR: INR: 2 (ref 2.0–3.0)

## 2022-02-07 NOTE — Patient Instructions (Addendum)
Pre visit review using our clinic review tool, if applicable. No additional management support is needed unless otherwise documented below in the visit note.  Continue 1/2 tablet daily except take 1 tablet on Wednesdays. Recheck in 4 weeks. Contact the coumadin clinic number at [phone], if any changes or any questions.

## 2022-02-07 NOTE — Progress Notes (Signed)
Continue 1/2 tablet daily except take 1 tablet on Wednesdays. Recheck in 4 weeks. Contact the coumadin clinic number at [phone], if any changes or any questions.   Advised if any changes in medication or s & s of bleeding or a clot to contact the office. Pt aware of s & s of both. Pt verbalized understanding.

## 2022-02-16 ENCOUNTER — Other Ambulatory Visit (HOSPITAL_BASED_OUTPATIENT_CLINIC_OR_DEPARTMENT_OTHER): Payer: Self-pay

## 2022-02-16 ENCOUNTER — Other Ambulatory Visit: Payer: Self-pay

## 2022-03-07 ENCOUNTER — Ambulatory Visit (INDEPENDENT_AMBULATORY_CARE_PROVIDER_SITE_OTHER): Payer: Medicare Other

## 2022-03-07 DIAGNOSIS — Z7901 Long term (current) use of anticoagulants: Secondary | ICD-10-CM | POA: Diagnosis not present

## 2022-03-07 LAB — POCT INR: INR: 1.9 — AB (ref 2.0–3.0)

## 2022-03-07 NOTE — Patient Instructions (Addendum)
Pre visit review using our clinic review tool, if applicable. No additional management support is needed unless otherwise documented below in the visit note.  Increase dose today to take 1 tablet and then change weekly dose to take 1/2 tablet daily except take 1 tablet on Sundays and Wednesdays. Recheck in 3 weeks. Contact the coumadin clinic number at [phone], if any changes or any questions.

## 2022-03-07 NOTE — Progress Notes (Signed)
Pt has been subtherapeutic or on the lowest end of her range for the last several readings. Due to this a change to weekly dose will be made. Increase dose today to take 1 tablet and then change weekly dose to take 1/2 tablet daily except take 1 tablet on Sundays and Wednesdays. Recheck in 3 weeks. Contact the coumadin clinic number at [phone], if any changes or any questions.

## 2022-03-15 NOTE — Progress Notes (Unsigned)
Office Visit Note  Patient: Rita Watkins             Date of Birth: 1955/09/25           MRN: [id number]             PCP: Elise Benne Referring: Elise Benne Visit Date: 03/16/2022   Subjective:  No chief complaint on file.   History of Present Illness: Rita Watkins is a 67 y.o. female here for follow up ***   Previous HPI 12/08/21 Rita Watkins is a 67 y.o. female here for follow up for evaluation of inflammatory arthritis concern for RA with repeat joint swelling, pain, and increased sedimentation rate. Since starting the methotrexate 15 mg PO weekly she feels a good improvement in symptoms with almost no morning stiffness and no flare ups. Shoulder and hand pains are better. Still has about the same degree of leg swelling and redness on the lateral side. She does notice some headache after taking each weekly dose but it lasts less than one day duration. She notices occasional shoulder pain provoked with lifting cases of water.   Previous HPI 10/25/2021 Rita Watkins is a 67 y.o. female here for follow up for evaluation of inflammatory arthritis concern for RA with repeat joint swelling, pain, and increased sedimentation rate and improvement on prednisone. Xrays at last visit showed no erosive disease and mild severity of OA changes. Lab tests were all negative for markers of ANA-related disease process. CBC and CMP okay, does have mild renal impairment. Since our visit she has a flare up of left shoulder pain limiting mobility. Pain radiates into the arm when getting more severe. Her ankle remains the same amount swollen. No new episode of hand swelling.   Previous HPI 10/06/2021 Rita Watkins is a 67 y.o. 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 She developed pain and swelling involving ankle and in right 2nd finger that was treated with prednisone taper with good response in PCP office. Lab testing positive for RF and elevated uric acid level. She has had  joint pain in multiple areas but symptoms significantly increased since earlier this year.  In May she developed severe pain involving the left shoulder and right hand particularly her second finger.  She saw significant swelling in the hands and around the right ankle.  She was treated with a oral prednisone course lasting 6 days and the symptoms stayed improved for a little over 1 month.  She had a recurrence of joint swelling in July that also improved quickly with oral prednisone.  Due to the swelling and warmth around the ankle she is been treated with oral antibiotics that did not significantly affect her symptoms. 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  Was complicated by ultimately developing sepsis requiring hospital visit and prolonged nursing care treatment course.   Labs reviewed 06/2021 ANA 1:80 cytoplasmic reticular/AMA RF 40 Uric acid 8.3 ESR 33 CPR <1   No Rheumatology ROS completed.   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   Peripheral vascular disease (HCC) 08/19/2016   Deep vein thrombosis (DVT) of popliteal vein of both lower extremities (HCC) 08/18/2016   Pulmonary embolism without acute cor pulmonale (HCC) 08/18/2016   Tobacco use 08/18/2016   Open leg wound, left, sequela  08/17/2016   Surgical wound, non healing 08/16/2016   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  CT abdomen 7/17:   1.  Large left retroperitoneal hematoma with mixed density blood products.  Assessment for active bleeding is not possible without IV contrast. 2.  Contrast in the renal collecting systems presumably from contrast enhanced studies performed on August 20, 2016 consistent with known impaired renal function.   CT abdomen 7/27:    Anxiety    Benign essential HTN 08/14/2016   Cardiac murmur 04/20/2020   Cigarette smoker 07/15/2014   COPD (chronic obstructive pulmonary disease) (HCC)    Deep vein thrombosis (DVT) of popliteal vein of both lower extremities (HCC) 08/18/2016   Last Assessment & Plan:  Formatting of this note might be different from the original. Not clear if provoked/unprovoked (pt had trauma to left toe back in 03/2016 with some decreased mobility thereafter) CT chest confirmed acute PE and also concerning for some bilateral upper lobe groundglass opacities Hematology on board and heparin drip started but  discontinued 7/17  2/2 large retroperitoneal he   Depression    Depression with anxiety 09/13/2012   Dizziness 07/19/2016   Essential hypertension 04/20/2020   History of pulmonary embolism 04/20/2020   Hyperlipidemia    Hypertension    Leukocytosis 07/30/2014   Lymphangitis 06/07/2016   Mycobacterial infection 06/07/2016   Mycobacterium infection, 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 Per dermatology: Please provide the patient with our outpatient clinic phone number [phone]) to call and schedule a follow up appointment for continued management.   Last Assessment & Plan:  Formatting of this note might be different from the original. Uncertain etiology Vasculitic vs infectious vs malignancy Extensive work up has   Obesity 09/20/2016   Last Assessment & Plan:  Formatting of this note might be different from the original. Body mass index is 22.67 kg/m.  - For weight loss counseling.   Obesity (BMI 30.0-34.9) 04/20/2020   Open leg wound, left, sequela 08/17/2016   Formatting of this note might be different from the original. - Chronic left wound starting in April 2018 and worsening despite antibiotics and debridement -Patient had multiple hospitalizations and was treated with steroids and antibiotics with worsening/extension of the wound - ESR 103, CRP 168 - ANA, factor V Leyden, Antithrombin III, Cardiolite and antibody IgG, C3 and C4 complement proteinase   Peripheral vascular disease (HCC) 08/19/2016   Last Assessment & Plan:  Formatting of  this note might be different from the original. 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  ASA 81 mg daily PT consult   Pulmonary embolism without acute cor pulmonale (HCC) 08/18/2016   Last Assessment & Plan:  Formatting of this note might be different from the original. 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 Added automatically from request for surgery [id number]  Last Assessment & Plan:  Formatting of this note might be different from the original. Presented with large area of necrotic tissue LLE Bilateral popliteal DVT Etiology uncertain:  Ischemic vs vasculitis vs autoimmune vs possible occult malignancy Bx: leukocytoblastic vasculopathy  In   Tobacco use 08/18/2016   Last Assessment & Plan:  Formatting of this note might be different from the original. Patient has a 30+ pack year history of smoking Discussed low dose CT chest for cancer screening at admission, patient open to this but concerned about extra cost as she has no insurance at this time. No obvious suspicious lesions noted on CTA PE study.  Counseled regarding tobacco cessation    Family History  Problem Relation Age of Onset   Hypertension Mother    Hyperlipidemia Mother    Osteoarthritis Mother    Rheum arthritis Mother    Hypertension Brother    Asthma Brother    Osteoarthritis Brother    Past Surgical History:  Procedure Laterality Date   FOOT SURGERY     Left   HIP PINNING     3 each hip   IRRIGATION AND DEBRIDEMENT ABSCESS N/A 06/27/2016   Procedure: IRRIGATION AND DEBRIDEMENT LEFT LEG WITH SOTFT TISSUE BIOPSY;  Surgeon: Clovis Riley, MD;  Location: [facility];  Service: General;  Laterality: N/A;   LEG AMPUTATION Left    LYMPH NODE BIOPSY Left 06/09/2016   Procedure: BIOPSY LEFT LEG;  Surgeon: Wallace Going, DO;  Location: [facility];  Service: Plastics;  Laterality: Left;   SLIPPED CAPITAL FEMORAL EPIPHYSIS PINNING     Social History   Social History Narrative   Does not exercise.   Immunization History  Administered Date(s) Administered   COVID-19, mRNA, vaccine(Comirnaty)12 years and older 12/07/2021   Fluad Quad(high Dose 65+) 12/18/2018,  11/01/2020, 11/03/2021   Influenza Split 11/20/2013   Influenza Whole 11/10/2012   Influenza, High Dose Seasonal PF 11/29/2015   Influenza,inj,Quad PF,6+ Mos 10/29/2014, 11/15/2017   PFIZER Comirnaty(Gray Top)Covid-19 Tri-Sucrose Vaccine 05/19/2020   PFIZER(Purple Top)SARS-COV-2 Vaccination 04/20/2019, 05/10/2019   Pfizer Covid-19 Vaccine Bivalent Booster 71yrs & up 11/01/2020   Pneumococcal Conjugate-13 12/18/2018   Pneumococcal Polysaccharide-23 09/14/2011   Tdap 04/09/2008     Objective: Vital Signs: There were no vitals taken for this visit.   Physical Exam   Musculoskeletal Exam: ***  CDAI Exam: CDAI Score: -- Patient Global: --; Provider Global: -- Swollen: --; Tender: -- Joint Exam 03/16/2022   No joint exam has been documented for this visit  There is currently no information documented on the homunculus. Go to the Rheumatology activity and complete the homunculus joint exam.  Investigation: No additional findings.  Imaging: No results found.  Recent Labs: Lab Results  Component Value Date   WBC 6.7 12/08/2021   HGB 10.8 (L) 12/08/2021   PLT 306 12/08/2021   NA 142 12/08/2021   K 4.8 12/08/2021   CL 107 12/08/2021   CO2 27 12/08/2021   GLUCOSE 81 12/08/2021   BUN 18 12/08/2021   CREATININE 1.17 (H) 12/08/2021   BILITOT 0.4 12/08/2021   ALKPHOS 81 04/05/2021   AST 21 12/08/2021   ALT 14 12/08/2021   PROT 6.9 12/08/2021   ALBUMIN 3.8 04/05/2021   CALCIUM 8.6 12/08/2021   GFRAA 72 08/05/2019    Speciality Comments: No specialty comments available.  Procedures:  No procedures performed Allergies: Avelox [moxifloxacin hcl in nacl], Montelukast sodium, and Pravastatin   Assessment / Plan:     Visit Diagnoses: No diagnosis found.  ***  Orders: No orders of the defined types were placed in this encounter.  No orders of the defined types were placed in this encounter.    Follow-Up Instructions: No follow-ups on file.   Collier Salina, MD  Note - This record has been created using [employer].  Chart creation errors have been sought, but may not always  have been located. Such creation errors do not reflect on  the standard of medical care.

## 2022-03-16 ENCOUNTER — Other Ambulatory Visit (HOSPITAL_BASED_OUTPATIENT_CLINIC_OR_DEPARTMENT_OTHER): Payer: Self-pay

## 2022-03-16 ENCOUNTER — Ambulatory Visit: Payer: Medicare Other | Attending: Internal Medicine | Admitting: Internal Medicine

## 2022-03-16 ENCOUNTER — Encounter: Payer: Self-pay | Admitting: Internal Medicine

## 2022-03-16 VITALS — BP 144/54 | HR 52 | Resp 15 | Ht 69.0 in | Wt 225.0 lb

## 2022-03-16 DIAGNOSIS — R768 Other specified abnormal immunological findings in serum: Secondary | ICD-10-CM | POA: Diagnosis present

## 2022-03-16 DIAGNOSIS — Z79899 Other long term (current) drug therapy: Secondary | ICD-10-CM

## 2022-03-16 DIAGNOSIS — M059 Rheumatoid arthritis with rheumatoid factor, unspecified: Secondary | ICD-10-CM | POA: Diagnosis present

## 2022-03-16 DIAGNOSIS — R700 Elevated erythrocyte sedimentation rate: Secondary | ICD-10-CM | POA: Diagnosis not present

## 2022-03-16 HISTORY — DX: Other long term (current) drug therapy: Z79.899

## 2022-03-16 MED ORDER — METHOTREXATE SODIUM 2.5 MG PO TABS
15.0000 mg | ORAL_TABLET | ORAL | 0 refills | Status: DC
  Filled 2022-03-16: qty 78, 91d supply, fill #0

## 2022-03-17 ENCOUNTER — Other Ambulatory Visit: Payer: Self-pay | Admitting: Medical

## 2022-03-17 DIAGNOSIS — Z79899 Other long term (current) drug therapy: Secondary | ICD-10-CM

## 2022-03-17 DIAGNOSIS — F419 Anxiety disorder, unspecified: Secondary | ICD-10-CM

## 2022-03-17 LAB — CBC WITH DIFFERENTIAL/PLATELET
Absolute Monocytes: 447 cells/uL (ref 200–950)
Basophils Absolute: 17 cells/uL (ref 0–200)
Basophils Relative: 0.3 %
Eosinophils Absolute: 157 cells/uL (ref 15–500)
Eosinophils Relative: 2.7 %
HCT: 36.6 % (ref 35.0–45.0)
Hemoglobin: 11.9 g/dL (ref 11.7–15.5)
Lymphs Abs: 1508 cells/uL (ref 850–3900)
MCH: 29.4 pg (ref 27.0–33.0)
MCHC: 32.5 g/dL (ref 32.0–36.0)
MCV: 90.4 fL (ref 80.0–100.0)
MPV: 9.9 fL (ref 7.5–12.5)
Monocytes Relative: 7.7 %
Neutro Abs: 3671 cells/uL (ref 1500–7800)
Neutrophils Relative %: 63.3 %
Platelets: 244 10*3/uL (ref 140–400)
RBC: 4.05 10*6/uL (ref 3.80–5.10)
RDW: 14.4 % (ref 11.0–15.0)
Total Lymphocyte: 26 %
WBC: 5.8 10*3/uL (ref 3.8–10.8)

## 2022-03-17 LAB — COMPLETE METABOLIC PANEL WITH GFR
AG Ratio: 1.2 (calc) (ref 1.0–2.5)
ALT: 15 U/L (ref 6–29)
AST: 23 U/L (ref 10–35)
Albumin: 3.7 g/dL (ref 3.6–5.1)
Alkaline phosphatase (APISO): 94 U/L (ref 37–153)
BUN: 15 mg/dL (ref 7–25)
CO2: 29 mmol/L (ref 20–32)
Calcium: 8.5 mg/dL — ABNORMAL LOW (ref 8.6–10.4)
Chloride: 105 mmol/L (ref 98–110)
Creat: 1.02 mg/dL (ref 0.50–1.05)
Globulin: 3.2 g/dL (calc) (ref 1.9–3.7)
Glucose, Bld: 82 mg/dL (ref 65–99)
Potassium: 4.7 mmol/L (ref 3.5–5.3)
Sodium: 141 mmol/L (ref 135–146)
Total Bilirubin: 0.3 mg/dL (ref 0.2–1.2)
Total Protein: 6.9 g/dL (ref 6.1–8.1)
eGFR: 60 mL/min/{1.73_m2} (ref >=60)

## 2022-03-17 LAB — SEDIMENTATION RATE: Sed Rate: 48 mm/h — ABNORMAL HIGH (ref 0–30)

## 2022-03-17 NOTE — Progress Notes (Signed)
Lab results shows a much better sedimentation rate down to 48 this is still not completely normal but I think she can continue just the current methotrexate 15 mg p.o. weekly.  Blood count kidney and liver function are fine.

## 2022-03-17 NOTE — Telephone Encounter (Signed)
Requesting: xanax Contract:03/23/21 UDS:03/23/21 Last Visit:09/20/21 Next Visit:10/17/22 Last Refill:01/18/22  Please Advise

## 2022-03-20 MED ORDER — GABAPENTIN 300 MG PO CAPS
300.0000 mg | ORAL_CAPSULE | Freq: Every day | ORAL | 3 refills | Status: DC
  Filled 2022-03-20: qty 90, 90d supply, fill #0
  Filled 2022-06-20: qty 90, 90d supply, fill #1
  Filled 2022-09-15: qty 90, 90d supply, fill #2
  Filled 2022-12-19: qty 90, 90d supply, fill #3

## 2022-03-20 MED ORDER — BENAZEPRIL HCL 20 MG PO TABS
20.0000 mg | ORAL_TABLET | Freq: Two times a day (BID) | ORAL | 3 refills | Status: DC
  Filled 2022-03-20: qty 180, 90d supply, fill #0

## 2022-03-20 NOTE — Telephone Encounter (Signed)
Pt needs her contract and uds signed. Then will fill xanax rx. If you can her come in to do that. Does not need appt with me.

## 2022-03-21 ENCOUNTER — Other Ambulatory Visit (HOSPITAL_BASED_OUTPATIENT_CLINIC_OR_DEPARTMENT_OTHER): Payer: Self-pay

## 2022-03-21 ENCOUNTER — Encounter: Payer: Self-pay | Admitting: Medical

## 2022-03-21 ENCOUNTER — Other Ambulatory Visit: Payer: Self-pay | Admitting: Medical

## 2022-03-21 ENCOUNTER — Other Ambulatory Visit: Payer: Self-pay

## 2022-03-21 MED ORDER — BENAZEPRIL HCL 20 MG PO TABS
20.0000 mg | ORAL_TABLET | Freq: Two times a day (BID) | ORAL | 3 refills | Status: DC
  Filled 2022-03-21: qty 180, 90d supply, fill #0
  Filled 2022-06-20: qty 180, 90d supply, fill #1
  Filled 2022-09-15: qty 180, 90d supply, fill #2
  Filled 2022-12-19: qty 180, 90d supply, fill #3

## 2022-03-21 MED ORDER — BENAZEPRIL HCL 20 MG PO TABS: 20.0000 mg | ORAL_TABLET | Freq: Two times a day (BID) | ORAL | 3 refills | Status: DC

## 2022-03-21 NOTE — Addendum Note (Signed)
Addended by: Anabel Halon on: 03/21/2022 05:47 PM   Modules accepted: Orders

## 2022-03-21 NOTE — Telephone Encounter (Signed)
Pt wants to know if the medication can be at the pharmacy tomorrow so she doesn't have to make 2 trips, her appointment is at 1:15

## 2022-03-21 NOTE — Telephone Encounter (Signed)
Lab appt made for 03/22/22

## 2022-03-22 ENCOUNTER — Other Ambulatory Visit: Payer: Medicare Other

## 2022-03-22 ENCOUNTER — Other Ambulatory Visit (HOSPITAL_BASED_OUTPATIENT_CLINIC_OR_DEPARTMENT_OTHER): Payer: Self-pay

## 2022-03-22 DIAGNOSIS — Z79899 Other long term (current) drug therapy: Secondary | ICD-10-CM

## 2022-03-22 DIAGNOSIS — F419 Anxiety disorder, unspecified: Secondary | ICD-10-CM

## 2022-03-22 MED ORDER — ALPRAZOLAM 0.5 MG PO TABS
0.5000 mg | ORAL_TABLET | Freq: Every evening | ORAL | 5 refills | Status: DC | PRN
  Filled 2022-03-22: qty 60, 60d supply, fill #0
  Filled 2022-04-20: qty 60, 60d supply, fill #1

## 2022-03-22 NOTE — Addendum Note (Signed)
Addended by: Anabel Halon on: 03/22/2022 01:39 PM   Modules accepted: Orders

## 2022-03-22 NOTE — Telephone Encounter (Signed)
Pt has completed her uds and contract

## 2022-03-25 LAB — DRUG TOX MONITOR 1 W/CONF, ORAL FLD

## 2022-03-28 ENCOUNTER — Ambulatory Visit (INDEPENDENT_AMBULATORY_CARE_PROVIDER_SITE_OTHER): Payer: Medicare Other

## 2022-03-28 DIAGNOSIS — Z7901 Long term (current) use of anticoagulants: Secondary | ICD-10-CM | POA: Diagnosis not present

## 2022-03-28 LAB — POCT INR: INR: 2.6 (ref 2.0–3.0)

## 2022-03-28 NOTE — Progress Notes (Signed)
Continue 1/2 tablet daily except take 1 tablet on Sundays and Wednesdays. Recheck in 4 weeks. Contact the coumadin clinic number at [phone], if any changes or any questions.

## 2022-03-28 NOTE — Patient Instructions (Addendum)
Pre visit review using our clinic review tool, if applicable. No additional management support is needed unless otherwise documented below in the visit note.  Continue 1/2 tablet daily except take 1 tablet on Sundays and Wednesdays. Recheck in 4 weeks. Contact the coumadin clinic number at [phone], if any changes or any questions.

## 2022-04-18 ENCOUNTER — Ambulatory Visit (INDEPENDENT_AMBULATORY_CARE_PROVIDER_SITE_OTHER): Payer: Medicare Other

## 2022-04-18 DIAGNOSIS — Z7901 Long term (current) use of anticoagulants: Secondary | ICD-10-CM

## 2022-04-18 LAB — POCT INR: INR: 2.2 (ref 2.0–3.0)

## 2022-04-18 NOTE — Progress Notes (Cosign Needed)
Continue 1/2 tablet daily except take 1 tablet on Sundays and Wednesdays. Recheck in 4 weeks. Contact the coumadin clinic number at [phone], if any changes or any questions.  

## 2022-04-18 NOTE — Patient Instructions (Addendum)
Pre visit review using our clinic review tool, if applicable. No additional management support is needed unless otherwise documented below in the visit note.  Continue 1/2 tablet daily except take 1 tablet on Sundays and Wednesdays. Recheck in 4 weeks. Contact the coumadin clinic number at [phone], if any changes or any questions.  

## 2022-04-20 ENCOUNTER — Other Ambulatory Visit (HOSPITAL_BASED_OUTPATIENT_CLINIC_OR_DEPARTMENT_OTHER): Payer: Self-pay

## 2022-04-20 ENCOUNTER — Encounter: Payer: Self-pay | Admitting: Medical

## 2022-04-20 MED ORDER — ALPRAZOLAM 0.5 MG PO TABS
0.5000 mg | ORAL_TABLET | Freq: Two times a day (BID) | ORAL | 1 refills | Status: DC | PRN
  Filled 2022-04-20: qty 60, 30d supply, fill #0
  Filled 2022-05-22: qty 60, 30d supply, fill #1

## 2022-04-20 NOTE — Addendum Note (Signed)
Addended by: Anabel Halon on: 04/20/2022 03:14 PM   Modules accepted: Orders

## 2022-04-25 ENCOUNTER — Ambulatory Visit (INDEPENDENT_AMBULATORY_CARE_PROVIDER_SITE_OTHER): Payer: Medicare Other | Admitting: Medical

## 2022-04-25 ENCOUNTER — Other Ambulatory Visit (HOSPITAL_BASED_OUTPATIENT_CLINIC_OR_DEPARTMENT_OTHER): Payer: Self-pay

## 2022-04-25 ENCOUNTER — Ambulatory Visit: Payer: Medicare Other

## 2022-04-25 VITALS — BP 138/60 | HR 64 | Temp 98.0°F | Resp 18 | Ht 69.0 in | Wt 225.0 lb

## 2022-04-25 DIAGNOSIS — Z86718 Personal history of other venous thrombosis and embolism: Secondary | ICD-10-CM | POA: Diagnosis not present

## 2022-04-25 DIAGNOSIS — J301 Allergic rhinitis due to pollen: Secondary | ICD-10-CM | POA: Diagnosis not present

## 2022-04-25 DIAGNOSIS — M2550 Pain in unspecified joint: Secondary | ICD-10-CM

## 2022-04-25 DIAGNOSIS — F419 Anxiety disorder, unspecified: Secondary | ICD-10-CM | POA: Diagnosis not present

## 2022-04-25 DIAGNOSIS — J4520 Mild intermittent asthma, uncomplicated: Secondary | ICD-10-CM

## 2022-04-25 DIAGNOSIS — I10 Essential (primary) hypertension: Secondary | ICD-10-CM

## 2022-04-25 MED ORDER — ALBUTEROL SULFATE HFA 108 (90 BASE) MCG/ACT IN AERS
2.0000 | INHALATION_SPRAY | Freq: Four times a day (QID) | RESPIRATORY_TRACT | 0 refills | Status: AC | PRN
  Filled 2022-04-25: qty 6.7, 20d supply, fill #0

## 2022-04-25 NOTE — Progress Notes (Signed)
Subjective:    Patient ID: Rita Watkins, female    DOB: 04-Jul-1955, 67 y.o.   MRN: [id number]  HPI  Pt in for follow up.  I filled out disability placard. Left lower ext amputation.   Pt has seropositive RA. She is on methothrexate 2.5 mg tab 6 tabs daily. Symptoms much improved. Left shoulder and hand pain minimal pain. Notices pain most with weather changes.  Pt also attending coumadin clinic. Hx of dvt.   Pt also has anxiety. Controlled with xanax 0.5 mg twice daily. She is up to date on contract and uds.   Seasonal allergies- pt on zyrtec. Symptoms controlled but with pollen recently wheezed outside. She has old proair rx. 10 years expired.    Review of Systems  Constitutional:  Negative for chills, fatigue and fever.  Respiratory:  Negative for cough, chest tightness, shortness of breath and wheezing.   Cardiovascular:  Negative for chest pain and palpitations.  Gastrointestinal:  Negative for abdominal pain, blood in stool, diarrhea and rectal pain.  Musculoskeletal:  Negative for back pain, joint swelling and neck pain.  Skin:  Negative for rash.  Neurological:  Negative for dizziness, speech difficulty, weakness, light-headedness and headaches.  Hematological:  Negative for adenopathy. Does not bruise/bleed easily.  Psychiatric/Behavioral:  Negative for behavioral problems, confusion and decreased concentration.     Past Medical History:  Diagnosis Date   Allergy    Anemia associated with acute blood loss 08/22/2016   Formatting of this note is different from the original.  CT abdomen 7/17:   1.  Large left retroperitoneal hematoma with mixed density blood products.  Assessment for active bleeding is not possible without IV contrast. 2.  Contrast in the renal collecting systems presumably from contrast enhanced studies performed on August 20, 2016 consistent with known impaired renal function.   CT abdomen 7/27:    Anxiety    Benign essential HTN 08/14/2016   Cardiac  murmur 04/20/2020   Cigarette smoker 07/15/2014   COPD (chronic obstructive pulmonary disease) (HCC)    COPD (chronic obstructive pulmonary disease) (HCC)    Deep vein thrombosis (DVT) of popliteal vein of both lower extremities ([place]) 08/18/2016   Last Assessment & Plan:  Formatting of this note might be different from the original. Not clear if provoked/unprovoked (pt had trauma to left toe back in 03/2016 with some decreased mobility thereafter) CT chest confirmed acute PE and also concerning for some bilateral upper lobe groundglass opacities Hematology on board and heparin drip started but  discontinued 7/17  2/2 large retroperitoneal he   Depression    Depression with anxiety 09/13/2012   Dizziness 07/19/2016   Essential hypertension 04/20/2020   History of pulmonary embolism 04/20/2020   Hyperlipidemia    Hypertension    Leukocytosis 07/30/2014   Lymphangitis 06/07/2016   Mycobacterial infection 06/07/2016   Mycobacterial infection 06/07/2016   Mycobacterium infection, 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 Per dermatology: Please provide the patient with our outpatient clinic phone number [phone]) to call and schedule a follow up appointment for continued management.   Last Assessment & Plan:  Formatting of this note might be different from the original. Uncertain etiology Vasculitic vs infectious vs malignancy Extensive work up has   Obesity 09/20/2016   Last Assessment & Plan:  Formatting of this note might be different from the original. Body  mass index is 22.67 kg/m.  - For weight loss counseling.   Obesity (BMI 30.0-34.9) 04/20/2020   Open leg wound, left, sequela 08/17/2016   Formatting of this note might be different from the original. - Chronic left wound starting in April 2018 and worsening despite antibiotics and debridement -Patient  had multiple hospitalizations and was treated with steroids and antibiotics with worsening/extension of the wound - ESR 103, CRP 168 - ANA, factor V Leyden, Antithrombin III, Cardiolite and antibody IgG, C3 and C4 complement proteinase   Peripheral vascular disease ([place]) 08/19/2016   Last Assessment & Plan:  Formatting of this note might be different from the original. 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  ASA 81 mg daily PT consult   Pulmonary embolism without acute cor pulmonale (HCC) 08/18/2016   Last Assessment & Plan:  Formatting of this note might be different from the original. 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 Added automatically from request for surgery [id number]  Last Assessment & Plan:  Formatting of this note might be different from the original. Presented with large area of necrotic tissue LLE Bilateral popliteal DVT Etiology uncertain:  Ischemic vs vasculitis vs autoimmune vs possible occult malignancy Bx: leukocytoblastic vasculopathy  In   Tobacco use 08/18/2016   Last Assessment & Plan:  Formatting of this note might be different from the original. Patient has a 30+ pack year history of smoking Discussed low dose CT chest for cancer screening at admission, patient open to this but concerned about extra cost as she has no insurance at this time. No obvious suspicious lesions noted on  CTA PE study.  Counseled regarding tobacco cessation     Social History   Socioeconomic History   Marital status: Widowed    Spouse name: Not on file   Number of children: Not on file   Years of education: high schoo   Highest education level: Not on file  Occupational History   Occupation: ADM. ASSISTANT    Employer: [employer]  Tobacco Use   Smoking status: Former    Packs/day: 1.00    Years: 35.00    Additional pack years: 0.00    Total pack years: 35.00    Types: Cigarettes    Quit date: 09/14/2016    Years since quitting: 5.6    Passive exposure: Past   Smokeless tobacco: Never  Vaping Use   Vaping Use: Never used  Substance and Sexual Activity   Alcohol use: No    Alcohol/week: 0.0 standard drinks of alcohol   Drug use: No   Sexual activity: Never  Other Topics Concern   Not on file  Social History Narrative   Does not exercise.   Social Determinants of Health   Financial Resource Strain: Low Risk  (10/06/2020)   Overall Financial Resource Strain (CARDIA)    Difficulty of Paying Living Expenses: Not hard at all  Food Insecurity:  No Food Insecurity (10/06/2020)   Hunger Vital Sign    Worried About Running Out of Food in the Last Year: Never true    Ran Out of Food in the Last Year: Never true  Transportation Needs: No Transportation Needs (10/06/2020)   PRAPARE - Hydrologist (Medical): No    Lack of Transportation (Non-Medical): No  Physical Activity: Insufficiently Active (10/06/2020)   Exercise Vital Sign    Days of Exercise per Week: 7 days    Minutes of Exercise per Session: 20 min  Stress: No Stress Concern Present (10/06/2020)   [place]    Feeling of Stress : Not at all  Social Connections: Moderately Isolated (10/06/2020)   Social Connection and Isolation Panel [NHANES]    Frequency of Communication with Friends and Family: More than three times a week     Frequency of Social Gatherings with Friends and Family: More than three times a week    Attends Religious Services: More than 4 times per year    Active Member of [employer] or Organizations: No    Attends Archivist Meetings: Never    Marital Status: Widowed  Intimate Partner Violence: Not At Risk (10/06/2020)   Humiliation, Afraid, Rape, and Kick questionnaire    Fear of Current or Ex-Partner: No    Emotionally Abused: No    Physically Abused: No    Sexually Abused: No    Past Surgical History:  Procedure Laterality Date   FOOT SURGERY     Left   HIP PINNING     3 each hip   IRRIGATION AND DEBRIDEMENT ABSCESS N/A 06/27/2016   Procedure: IRRIGATION AND DEBRIDEMENT LEFT LEG WITH SOTFT TISSUE BIOPSY;  Surgeon: Clovis Riley, MD;  Location: [facility];  Service: General;  Laterality: N/A;   LEG AMPUTATION Left    LYMPH NODE BIOPSY Left 06/09/2016   Procedure: BIOPSY LEFT LEG;  Surgeon: Wallace Going, DO;  Location: [facility];  Service: Plastics;  Laterality: Left;   SLIPPED CAPITAL FEMORAL EPIPHYSIS PINNING      Family History  Problem Relation Age of Onset   Hypertension Mother    Hyperlipidemia Mother    Osteoarthritis Mother    Rheum arthritis Mother    Hypertension Brother    Asthma Brother    Osteoarthritis Brother     Allergies  Allergen Reactions   Avelox [Moxifloxacin Hcl In Nacl] Anaphylaxis   Montelukast Sodium Other (See Comments)    Insomnia   Pravastatin Other (See Comments)    Myalgias on 20mg     Current Outpatient Medications on File Prior to Visit  Medication Sig Dispense Refill   ALPRAZolam (XANAX) 0.5 MG tablet Take 1 tablet (0.5 mg total) by mouth 2 (two) times daily as needed for anxiety. 60 tablet 1   amLODipine (NORVASC) 2.5 MG tablet Take 1 tablet (2.5 mg total) by mouth daily. 90 tablet 1   benazepril (LOTENSIN) 20 MG tablet Take 1 tablet (20 mg total) by mouth 2 (two) times daily. 180 tablet 3   fluticasone (FLONASE) 50 MCG/ACT nasal  spray Place 2 sprays into both nostrils daily. (Patient not taking: Reported on [id number]) 16 g 1   folic acid (FOLVITE) 1 MG tablet Take 1 tablet (1 mg total) by mouth daily. 90 tablet 3   gabapentin (NEURONTIN) 300 MG capsule Take 1 capsule (300 mg total) by mouth at bedtime. 90 capsule 3   influenza vaccine adjuvanted (  FLUAD) 0.5 ML injection Inject into the muscle. (Patient not taking: Reported on 10/06/2021) 0.5 mL 0   methotrexate (RHEUMATREX) 2.5 MG tablet Take 6 tablets (15 mg total) by mouth once a week. Caution: Chemotherapy. Protect from light. 78 tablet 0   warfarin (COUMADIN) 5 MG tablet TAKE 1 TABLET BY MOUTH ONCE DAILY 30 tablet 5   [DISCONTINUED] DULoxetine (CYMBALTA) 30 MG capsule Take 1 capsule (30 mg total) by mouth daily. 30 capsule 0   No current facility-administered medications on file prior to visit.    BP 138/60   Pulse 64   Temp 98 F (36.7 C)   Resp 18   Ht 5\' 9"  (1.753 m)   Wt 225 lb (102.1 kg)   SpO2 94%   BMI 33.23 kg/m        Objective:   Physical Exam  General Mental Status- Alert. General Appearance- Not in acute distress.   Skin General: Color- Normal Color. Moisture- Normal Moisture.  Neck Carotid Arteries- Normal color. Moisture- Normal Moisture. No carotid bruits. No JVD.  Chest and Lung Exam Auscultation: Breath Sounds:-Normal.  Cardiovascular Auscultation:Rythm- Regular. Murmurs & Other Heart Sounds:Auscultation of the heart reveals- No Murmurs.  Abdomen Inspection:-Inspeection Normal. Palpation/Percussion:Note:No mass. Palpation and Percussion of the abdomen reveal- Non Tender, Non Distended + BS, no rebound or guarding.   Neurologic Cranial Nerve exam:- CN III-XII intact(No nystagmus), symmetric smile. Strength:- 5/5 equal and symmetric strength both upper and lower extremities.       Assessment & Plan:   Patient Instructions  History of DVT (deep vein thrombosis) Continue coumadin thru coumadin clinic.  Arthralgia,  unspecified joint Continue methotrexate and follow up with rheumatologist.   Anxiety Continue xanax. Up to date on contract and uds. Follow up in 6 months controlled med visit.  Seasonal allergic rhinitis due to pollen Continue flonase and zyrtec.  Mild intermittent asthma without complication Refilled your albuterol inhaler.  Benign essential HTN Continue amlodipine and lotensin. Bp controlled today.  Filled out parking/disability placard today.  Follow up in 5 month or sooner if needed.     Mackie Pai, PA-C

## 2022-04-25 NOTE — Patient Instructions (Addendum)
History of DVT (deep vein thrombosis) Continue coumadin thru coumadin clinic.  Arthralgia, unspecified joint Continue methotrexate and follow up with rheumatologist.   Anxiety Continue xanax. Up to date on contract and uds. Follow up in 6 months controlled med visit.  Seasonal allergic rhinitis due to pollen Continue flonase and zyrtec.  Mild intermittent asthma without complication Refilled your albuterol inhaler.  Benign essential HTN Continue amlodipine and lotensin. Bp controlled today.  Filled out parking/disability placard today.  Follow up in 5 month or sooner if needed.

## 2022-05-16 ENCOUNTER — Ambulatory Visit: Payer: Medicare Other

## 2022-05-22 ENCOUNTER — Other Ambulatory Visit: Payer: Self-pay

## 2022-05-22 ENCOUNTER — Other Ambulatory Visit: Payer: Self-pay | Admitting: Medical

## 2022-05-22 ENCOUNTER — Other Ambulatory Visit (HOSPITAL_BASED_OUTPATIENT_CLINIC_OR_DEPARTMENT_OTHER): Payer: Self-pay

## 2022-05-22 MED ORDER — AMLODIPINE BESYLATE 2.5 MG PO TABS
2.5000 mg | ORAL_TABLET | Freq: Every day | ORAL | 1 refills | Status: DC
  Filled 2022-05-22: qty 90, 90d supply, fill #0
  Filled 2022-08-22: qty 90, 90d supply, fill #1

## 2022-05-23 ENCOUNTER — Ambulatory Visit (INDEPENDENT_AMBULATORY_CARE_PROVIDER_SITE_OTHER): Payer: Medicare Other

## 2022-05-23 DIAGNOSIS — Z7901 Long term (current) use of anticoagulants: Secondary | ICD-10-CM | POA: Diagnosis not present

## 2022-05-23 LAB — POCT INR: INR: 1.9 — AB (ref 2.0–3.0)

## 2022-05-23 NOTE — Progress Notes (Signed)
Pt reports eating more vitamin K rich foods lately. Increase dose today to take 1 tablet and then continue 1/2 tablet daily except take 1 tablet on Sundays and Wednesdays. Recheck in 3 weeks. Contact the coumadin clinic number at [phone], if any changes or any questions.

## 2022-05-23 NOTE — Patient Instructions (Addendum)
Pre visit review using our clinic review tool, if applicable. No additional management support is needed unless otherwise documented below in the visit note.  Increase dose today to take 1 tablet and then continue 1/2 tablet daily except take 1 tablet on Sundays and Wednesdays. Recheck in 3 weeks. Contact the coumadin clinic number at [phone], if any changes or any questions.

## 2022-06-13 ENCOUNTER — Ambulatory Visit (INDEPENDENT_AMBULATORY_CARE_PROVIDER_SITE_OTHER): Payer: Medicare Other

## 2022-06-13 DIAGNOSIS — Z7901 Long term (current) use of anticoagulants: Secondary | ICD-10-CM

## 2022-06-13 LAB — POCT INR: INR: 2 (ref 2.0–3.0)

## 2022-06-13 NOTE — Progress Notes (Unsigned)
Office Visit Note  Patient: Rita Watkins             Date of Birth: [birth year]           MRN: [id number]             PCP: Marisue Brooklyn Referring: Marisue Brooklyn Visit Date: 06/14/2022   Subjective:  No chief complaint on file.   History of Present Illness: Rita Watkins is a 67 y.o. female here for follow up ***   Previous HPI 03/16/22 Rita Watkins is a 67 y.o. 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 weekly and folic acid 1 mg daily.  Joint pain and swelling in her hands are doing well and left shoulder pain remains mostly improved.  She is noticing some increased pain and stiffness at the right shoulder which is a new problem.  It is not hurting at rest just with use and no radiation.  Still has some redness and irritation overlying the swelling at her right ankle with itching.   Previous HPI 12/08/21 Rita Watkins is a 67 y.o. female here for follow up for evaluation of inflammatory arthritis concern for RA with repeat joint swelling, pain, and increased sedimentation rate. Since starting the methotrexate 15 mg PO weekly she feels a good improvement in symptoms with almost no morning stiffness and no flare ups. Shoulder and hand pains are better. Still has about the same degree of leg swelling and redness on the lateral side. She does notice some headache after taking each weekly dose but it lasts less than one day duration. She notices occasional shoulder pain provoked with lifting cases of water.   Previous HPI 10/25/2021 Rita Watkins is a 67 y.o. female here for follow up for evaluation of inflammatory arthritis concern for RA with repeat joint swelling, pain, and increased sedimentation rate and improvement on prednisone. Xrays at last visit showed no erosive disease and mild severity of OA changes. Lab tests were all negative for markers of ANA-related disease process. CBC and CMP okay, does have mild renal impairment. Since our visit she  has a flare up of left shoulder pain limiting mobility. Pain radiates into the arm when getting more severe. Her ankle remains the same amount swollen. No new episode of hand swelling.   Previous HPI 10/06/2021 Rita Watkins is a 67 y.o. 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 She developed pain and swelling involving ankle and in right 2nd finger that was treated with prednisone taper with good response in PCP office. Lab testing positive for RF and elevated uric acid level. She has had joint pain in multiple areas but symptoms significantly increased since earlier this year.  In May she developed severe pain involving the left shoulder and right hand particularly her second finger.  She saw significant swelling in the hands and around the right ankle.  She was treated with a oral prednisone course lasting 6 days and the symptoms stayed improved for a little over 1 month.  She had a recurrence of joint swelling in July that also improved quickly with oral prednisone.  Due to the swelling and warmth around the ankle she is been treated with oral antibiotics that did not significantly affect her symptoms. 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  Was complicated by ultimately developing sepsis requiring hospital visit and prolonged nursing care treatment course.   Labs reviewed 06/2021 ANA 1:80 cytoplasmic reticular/AMA RF 40 Uric acid 8.3 ESR 33 CPR <1   No Rheumatology ROS completed.   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   Peripheral vascular disease (HCC) 08/19/2016   Deep vein thrombosis (DVT) of popliteal vein of both lower extremities (HCC) 08/18/2016   Pulmonary embolism without acute cor pulmonale (HCC) 08/18/2016   Tobacco use 08/18/2016   Open leg wound, left, sequela 08/17/2016   Surgical wound, non healing 08/16/2016   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  CT abdomen 7/17:   1.  Large left retroperitoneal hematoma with mixed density blood products.  Assessment for active bleeding is not possible without IV contrast. 2.  Contrast in the renal collecting systems presumably from contrast enhanced studies performed on August 20, 2016 consistent with known impaired renal function.   CT abdomen 7/27:    Anxiety    Benign essential HTN 08/14/2016   Cardiac murmur 04/20/2020   Cigarette smoker 07/15/2014   COPD (chronic obstructive pulmonary disease) (HCC)    COPD (chronic obstructive pulmonary disease) (HCC)    Deep vein thrombosis (DVT) of popliteal vein of both lower extremities (HCC) 08/18/2016   Last Assessment & Plan:  Formatting of this note might be different from the original. Not clear if provoked/unprovoked (pt had trauma to left toe back in 03/2016 with some decreased  mobility thereafter) CT chest confirmed acute PE and also concerning for some bilateral upper lobe groundglass opacities Hematology on board and heparin drip started but  discontinued 7/17  2/2 large retroperitoneal he   Depression    Depression with anxiety 09/13/2012   Dizziness 07/19/2016   Essential hypertension 04/20/2020   History of pulmonary embolism 04/20/2020   Hyperlipidemia    Hypertension    Leukocytosis 07/30/2014   Lymphangitis 06/07/2016   Mycobacterial infection 06/07/2016   Mycobacterial infection 06/07/2016   Mycobacterium infection, 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 Per dermatology: Please provide the patient with our outpatient clinic phone number [phone]) to call and schedule a follow up appointment for continued management.   Last Assessment & Plan:  Formatting of this note might be different from the original. Uncertain etiology Vasculitic vs infectious vs malignancy Extensive work up has   Obesity 09/20/2016  Last Assessment & Plan:  Formatting of this note might be different from the original. Body mass index is 22.67 kg/m.  - For weight loss counseling.   Obesity (BMI 30.0-34.9) 04/20/2020   Open leg wound, left, sequela 08/17/2016   Formatting of this note might be different from the original. - Chronic left wound starting in April 2018 and worsening despite antibiotics and debridement -Patient had multiple hospitalizations and was treated with steroids and antibiotics with worsening/extension of the wound - ESR 103, CRP 168 - ANA, factor V Leyden, Antithrombin III, Cardiolite and antibody IgG, C3 and C4 complement proteinase   Peripheral vascular disease (HCC) 08/19/2016   Last Assessment & Plan:  Formatting of this note might be different from the original. Bilateral popliteal DVT seen on admission Occlusion of the  left common iliac with collateral blood flow to common femoral artery LLE angiogram on 08/22/16 for attempt at revascularization to optimize blood flow for healing Left AKA 7/24 I&D 7/31 and 8/2 with further procedures planned.  ASA 81 mg daily PT consult   Pulmonary embolism without acute cor pulmonale (HCC) 08/18/2016   Last Assessment & Plan:  Formatting of this note might be different from the original. 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 Added automatically from request for surgery [id number]  Last Assessment & Plan:  Formatting of this note might be different from the original. Presented with large area of necrotic tissue LLE Bilateral popliteal DVT Etiology uncertain:  Ischemic vs vasculitis vs autoimmune vs possible occult malignancy Bx: leukocytoblastic vasculopathy  In   Tobacco use 08/18/2016   Last Assessment & Plan:  Formatting of this note might be different from the original. Patient has a 30+ pack year history of smoking Discussed low dose CT chest for cancer screening at admission, patient open to this but concerned about extra cost as she has no insurance at this time. No obvious suspicious lesions noted on CTA PE study.  Counseled regarding tobacco cessation    Family History  Problem Relation Age of Onset   Hypertension Mother    Hyperlipidemia Mother    Osteoarthritis Mother    Rheum arthritis Mother    Hypertension Brother    Asthma Brother    Osteoarthritis Brother    Past Surgical History:  Procedure Laterality Date   FOOT SURGERY     Left   HIP PINNING     3 each hip   IRRIGATION AND DEBRIDEMENT ABSCESS N/A  06/27/2016   Procedure: IRRIGATION AND DEBRIDEMENT LEFT LEG WITH SOTFT TISSUE BIOPSY;  Surgeon: Berna Bue, MD;  Location: [facility];  Service: General;  Laterality: N/A;   LEG AMPUTATION Left    LYMPH NODE BIOPSY Left 06/09/2016   Procedure: BIOPSY LEFT LEG;  Surgeon: Peggye Form, DO;  Location: [facility];  Service: Plastics;  Laterality: Left;   SLIPPED CAPITAL FEMORAL EPIPHYSIS PINNING     Social History   Social History Narrative   Does not exercise.   Immunization History  Administered Date(s) Administered   COVID-19, mRNA, vaccine(Comirnaty)12 years and older 12/07/2021   Fluad Quad(high Dose 65+) 12/18/2018, 11/01/2020, 11/03/2021  Influenza Split 11/20/2013   Influenza Whole 11/10/2012   Influenza, High Dose Seasonal PF 11/29/2015   Influenza,inj,Quad PF,6+ Mos 10/29/2014, 11/15/2017   PFIZER Comirnaty(Gray Top)Covid-19 Tri-Sucrose Vaccine 05/19/2020   PFIZER(Purple Top)SARS-COV-2 Vaccination 04/20/2019, 05/10/2019   Pfizer Covid-19 Vaccine Bivalent Booster 33yrs & up 11/01/2020   Pneumococcal Conjugate-13 12/18/2018   Pneumococcal Polysaccharide-23 09/14/2011   Tdap 04/09/2008     Objective: Vital Signs: There were no vitals taken for this visit.   Physical Exam   Musculoskeletal Exam: ***  CDAI Exam: CDAI Score: -- Patient Global: --; Provider Global: -- Swollen: --; Tender: -- Joint Exam 06/14/2022   No joint exam has been documented for this visit   There is currently no information documented on the homunculus. Go to the Rheumatology activity and complete the homunculus joint exam.  Investigation: No additional findings.  Imaging: No results found.  Recent Labs: Lab Results  Component Value Date   WBC 5.8 03/16/2022   HGB 11.9 03/16/2022   PLT 244 03/16/2022   NA 141 03/16/2022   K 4.7 03/16/2022   CL 105 03/16/2022   CO2 29 03/16/2022   GLUCOSE 82 03/16/2022   BUN 15 03/16/2022   CREATININE 1.02 03/16/2022   BILITOT 0.3  03/16/2022   ALKPHOS 81 04/05/2021   AST 23 03/16/2022   ALT 15 03/16/2022   PROT 6.9 03/16/2022   ALBUMIN 3.8 04/05/2021   CALCIUM 8.5 (L) 03/16/2022   GFRAA 72 08/05/2019    Speciality Comments: No specialty comments available.  Procedures:  No procedures performed Allergies: Avelox [moxifloxacin hcl in nacl], Montelukast sodium, and Pravastatin   Assessment / Plan:     Visit Diagnoses: No diagnosis found.  ***  Orders: No orders of the defined types were placed in this encounter.  No orders of the defined types were placed in this encounter.    Follow-Up Instructions: No follow-ups on file.   Fuller Plan, MD  Note - This record has been created using [employer].  Chart creation errors have been sought, but may not always  have been located. Such creation errors do not reflect on  the standard of medical care.

## 2022-06-13 NOTE — Progress Notes (Signed)
Continue 1/2 tablet daily except take 1 tablet on Sundays and Wednesdays. Recheck in 4 weeks. Contact the coumadin clinic number at [phone], if any changes or any questions.  

## 2022-06-13 NOTE — Patient Instructions (Addendum)
Pre visit review using our clinic review tool, if applicable. No additional management support is needed unless otherwise documented below in the visit note.  Continue 1/2 tablet daily except take 1 tablet on Sundays and Wednesdays. Recheck in 4 weeks. Contact the coumadin clinic number at [phone], if any changes or any questions.  

## 2022-06-14 ENCOUNTER — Ambulatory Visit: Payer: Medicare Other | Attending: Internal Medicine | Admitting: Internal Medicine

## 2022-06-14 ENCOUNTER — Encounter: Payer: Self-pay | Admitting: Internal Medicine

## 2022-06-14 ENCOUNTER — Other Ambulatory Visit (HOSPITAL_BASED_OUTPATIENT_CLINIC_OR_DEPARTMENT_OTHER): Payer: Self-pay

## 2022-06-14 VITALS — BP 137/65 | HR 57 | Resp 14 | Ht 69.0 in | Wt 225.0 lb

## 2022-06-14 DIAGNOSIS — M059 Rheumatoid arthritis with rheumatoid factor, unspecified: Secondary | ICD-10-CM | POA: Diagnosis not present

## 2022-06-14 DIAGNOSIS — Z79899 Other long term (current) drug therapy: Secondary | ICD-10-CM | POA: Diagnosis not present

## 2022-06-14 DIAGNOSIS — R768 Other specified abnormal immunological findings in serum: Secondary | ICD-10-CM

## 2022-06-14 DIAGNOSIS — R700 Elevated erythrocyte sedimentation rate: Secondary | ICD-10-CM

## 2022-06-14 MED ORDER — METHOTREXATE SODIUM 2.5 MG PO TABS
15.0000 mg | ORAL_TABLET | ORAL | 0 refills | Status: DC
Start: 2022-06-14 — End: 2022-09-18
  Filled 2022-06-14: qty 78, 91d supply, fill #0

## 2022-06-15 LAB — CBC WITH DIFFERENTIAL/PLATELET
Absolute Monocytes: 779 cells/uL (ref 200–950)
Basophils Absolute: 20 cells/uL (ref 0–200)
Basophils Relative: 0.3 %
Eosinophils Absolute: 139 cells/uL (ref 15–500)
Eosinophils Relative: 2.1 %
HCT: 35.6 % (ref 35.0–45.0)
Hemoglobin: 11.6 g/dL — ABNORMAL LOW (ref 11.7–15.5)
Lymphs Abs: 1478 cells/uL (ref 850–3900)
MCH: 29.8 pg (ref 27.0–33.0)
MCHC: 32.6 g/dL (ref 32.0–36.0)
MCV: 91.5 fL (ref 80.0–100.0)
MPV: 10.1 fL (ref 7.5–12.5)
Monocytes Relative: 11.8 %
Neutro Abs: 4184 cells/uL (ref 1500–7800)
Neutrophils Relative %: 63.4 %
Platelets: 231 10*3/uL (ref 140–400)
RBC: 3.89 10*6/uL (ref 3.80–5.10)
RDW: 15.5 % — ABNORMAL HIGH (ref 11.0–15.0)
Total Lymphocyte: 22.4 %
WBC: 6.6 10*3/uL (ref 3.8–10.8)

## 2022-06-15 LAB — COMPLETE METABOLIC PANEL WITH GFR
AG Ratio: 1.1 (calc) (ref 1.0–2.5)
ALT: 12 U/L (ref 6–29)
AST: 15 U/L (ref 10–35)
Albumin: 3.4 g/dL — ABNORMAL LOW (ref 3.6–5.1)
Alkaline phosphatase (APISO): 78 U/L (ref 37–153)
BUN/Creatinine Ratio: 15 (calc) (ref 6–22)
BUN: 17 mg/dL (ref 7–25)
CO2: 27 mmol/L (ref 20–32)
Calcium: 8.4 mg/dL — ABNORMAL LOW (ref 8.6–10.4)
Chloride: 106 mmol/L (ref 98–110)
Creat: 1.12 mg/dL — ABNORMAL HIGH (ref 0.50–1.05)
Globulin: 3 g/dL (calc) (ref 1.9–3.7)
Glucose, Bld: 84 mg/dL (ref 65–99)
Potassium: 4.5 mmol/L (ref 3.5–5.3)
Sodium: 141 mmol/L (ref 135–146)
Total Bilirubin: 0.5 mg/dL (ref 0.2–1.2)
Total Protein: 6.4 g/dL (ref 6.1–8.1)
eGFR: 54 mL/min/{1.73_m2} — ABNORMAL LOW (ref >=60)

## 2022-06-15 LAB — SEDIMENTATION RATE: Sed Rate: 46 mm/h — ABNORMAL HIGH (ref 0–30)

## 2022-06-16 NOTE — Progress Notes (Signed)
Sed rate remains about the same at 46. This is better but still not normal. Blood count is unchanged. Metabolic panel also about the same GFR of 54 is no problem for continuing methotrexate.

## 2022-06-20 ENCOUNTER — Other Ambulatory Visit: Payer: Self-pay

## 2022-06-20 ENCOUNTER — Other Ambulatory Visit (HOSPITAL_BASED_OUTPATIENT_CLINIC_OR_DEPARTMENT_OTHER): Payer: Self-pay

## 2022-06-20 ENCOUNTER — Other Ambulatory Visit: Payer: Self-pay | Admitting: Medical

## 2022-06-20 MED ORDER — ALPRAZOLAM 0.5 MG PO TABS
0.5000 mg | ORAL_TABLET | Freq: Two times a day (BID) | ORAL | 1 refills | Status: DC | PRN
  Filled 2022-06-20: qty 60, 30d supply, fill #0
  Filled 2022-07-21: qty 60, 30d supply, fill #1

## 2022-06-20 NOTE — Telephone Encounter (Signed)
Rx refill sent to pharmacy. 

## 2022-06-20 NOTE — Telephone Encounter (Signed)
Requesting: alprazolam 0.5mg   Contract: 03/21/22 UDS: 03/22/22 Last Visit: 04/25/22 Next Visit: 09/20/22 Last Refill: 04/20/22 #60 and 0RF   Please Advise

## 2022-06-23 ENCOUNTER — Telehealth: Payer: Self-pay | Admitting: Internal Medicine

## 2022-06-23 NOTE — Telephone Encounter (Signed)
Patient called stating she has developed a rash which is localized to her left side and wraps around to her chest.  Patient is not sure if this is a reaction to her Methotrexate medication.  Patient states she has been applying neosporin, but doesn't think it is helping.  Patient states she has not taken any new medications and has not changed her laundry detergent. Patient states it is not shingles.  Patient requested a return call.

## 2022-06-26 ENCOUNTER — Encounter: Payer: Self-pay | Admitting: Internal Medicine

## 2022-06-26 DIAGNOSIS — R21 Rash and other nonspecific skin eruption: Secondary | ICD-10-CM

## 2022-06-26 MED ORDER — NYSTATIN 100000 UNIT/GM EX CREA
1.0000 | TOPICAL_CREAM | Freq: Two times a day (BID) | CUTANEOUS | 0 refills | Status: DC
Start: 2022-06-26 — End: 2022-10-17
  Filled 2022-06-26: qty 30, 30d supply, fill #0

## 2022-06-26 NOTE — Telephone Encounter (Signed)
I spoke with Ms. Neel about new skin rash that started on her left side and has progressed to expand onto her abdomen and under the left breast.  She describes it as erythematous and spotty and painful.  Occasionally with some scant blood when removing her bra or it rubs the affected area.  She tried using Neosporin on this that was slightly helpful for pain but did not improve the overall rash. I do not think this sounds typical for methotrexate related side effects.  Will probably need to take a look at his neck step she will try to send a picture on MyChart.

## 2022-06-27 ENCOUNTER — Other Ambulatory Visit (HOSPITAL_BASED_OUTPATIENT_CLINIC_OR_DEPARTMENT_OTHER): Payer: Self-pay

## 2022-07-11 ENCOUNTER — Ambulatory Visit (INDEPENDENT_AMBULATORY_CARE_PROVIDER_SITE_OTHER): Payer: Medicare Other

## 2022-07-11 DIAGNOSIS — Z7901 Long term (current) use of anticoagulants: Secondary | ICD-10-CM

## 2022-07-11 LAB — POCT INR: INR: 1.9 — AB (ref 2.0–3.0)

## 2022-07-11 NOTE — Progress Notes (Addendum)
Pt has been subtherapeutic or right on the edge of subtherapeutic the last three INR checks. Will make a dosing change to try to bring up INR closer to goal of 2.5.  Increase dose today to take 1 tablet and then change weekly dose to take 1/2 tablet daily except take 1 tablet on Mondays, Wednesdays and Fridays. Recheck in 3 weeks. Contact the coumadin clinic number at [phone], if any changes or any questions.

## 2022-07-11 NOTE — Patient Instructions (Addendum)
Pre visit review using our clinic review tool, if applicable. No additional management support is needed unless otherwise documented below in the visit note.  Increase dose today to take 1 tablet and then change weekly dose to take 1/2 tablet daily except take 1 tablet on Mondays, Wednesdays and Fridays. Recheck in 3 weeks. Contact the coumadin clinic number at [phone], if any changes or any questions.

## 2022-07-21 ENCOUNTER — Other Ambulatory Visit: Payer: Self-pay

## 2022-07-21 ENCOUNTER — Other Ambulatory Visit (HOSPITAL_BASED_OUTPATIENT_CLINIC_OR_DEPARTMENT_OTHER): Payer: Self-pay

## 2022-07-23 ENCOUNTER — Ambulatory Visit
Admission: EM | Admit: 2022-07-23 | Discharge: 2022-07-23 | Disposition: A | Discharge status: Home / Self Care | Payer: Medicare Other | Attending: Nurse Practitioner | Admitting: Nurse Practitioner

## 2022-07-23 DIAGNOSIS — L03116 Cellulitis of left lower limb: Secondary | ICD-10-CM

## 2022-07-23 MED ORDER — MUPIROCIN CALCIUM 2 % EX CREA
1.0000 | TOPICAL_CREAM | Freq: Two times a day (BID) | CUTANEOUS | 0 refills | Status: DC
Start: 2022-07-23 — End: 2022-10-17

## 2022-07-23 MED ORDER — CEPHALEXIN 500 MG PO CAPS
500.0000 mg | ORAL_CAPSULE | Freq: Four times a day (QID) | ORAL | 0 refills | Status: AC
Start: 2022-07-23 — End: 2022-07-30

## 2022-07-23 NOTE — ED Provider Notes (Addendum)
[facility]    CSN: [id number] Arrival date & time: 07/23/22  1233      History   Chief Complaint Chief Complaint  Patient presents with   Abscess    HPI Rita Watkins is a 67 y.o. female presents for a skin infection.  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  She states over the past 4 days she has noticed a odor, redness and drainage from the stump.  She states she tries her best to keep the area clean and is had no issues with infections since the amputation.  She does states she has had intermittent fevers from 99-1 01.  She is currently afebrile in clinic.  States she feels fine and does not feel ill or fatigued.  Denies history of MRSA.  She has not applied any OTC medications to the area since symptoms began.  She is on Coumadin and follows with the Coumadin clinic for her INR.  Last INR 1.9.  No other concerns this time.   Abscess   Past Medical History:  Diagnosis Date   Allergy    Anemia associated with acute blood loss 08/22/2016   Formatting of this note is different from the original.  CT abdomen 7/17:   1.  Large left retroperitoneal hematoma with mixed density blood products.  Assessment for active bleeding is not possible without IV contrast. 2.  Contrast in the renal collecting systems presumably from contrast enhanced studies performed on August 20, 2016 consistent with known impaired renal function.   CT abdomen 7/27:    Anxiety    Benign essential HTN 08/14/2016   Cardiac murmur 04/20/2020   Cigarette smoker 07/15/2014   COPD (chronic obstructive pulmonary disease) (HCC)    COPD (chronic obstructive pulmonary disease) (HCC)    Deep vein thrombosis (DVT) of popliteal vein of both lower extremities (HCC) 08/18/2016   Last Assessment & Plan:  Formatting of this note might be different from the original. Not clear if provoked/unprovoked (pt had trauma to left toe back in 03/2016 with some decreased mobility thereafter) CT  chest confirmed acute PE and also concerning for some bilateral upper lobe groundglass opacities Hematology on board and heparin drip started but  discontinued 7/17  2/2 large retroperitoneal he   Depression    Depression with anxiety 09/13/2012   Dizziness 07/19/2016   Essential hypertension 04/20/2020   History of pulmonary embolism 04/20/2020   Hyperlipidemia    Hypertension    Leukocytosis 07/30/2014   Lymphangitis 06/07/2016   Mycobacterial infection 06/07/2016   Mycobacterial infection 06/07/2016   Mycobacterium infection, 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 Per dermatology: Please provide the patient with our outpatient clinic phone number [phone]) to call and schedule a follow up appointment for continued management.   Last Assessment & Plan:  Formatting of this note might be different from the original. Uncertain etiology Vasculitic vs infectious vs malignancy Extensive work up has   Obesity 09/20/2016   Last Assessment & Plan:  Formatting of this note might be different from the original. Body mass index is 22.67 kg/m.  - For weight loss counseling.   Obesity (BMI 30.0-34.9) 04/20/2020   Open leg wound, left, sequela 08/17/2016   Formatting of this note might be different from the original. - Chronic left wound starting in April 2018 and  worsening despite antibiotics and debridement -Patient had multiple hospitalizations and was treated with steroids and antibiotics with worsening/extension of the wound - ESR 103, CRP 168 - ANA, factor V Leyden, Antithrombin III, Cardiolite and antibody IgG, C3 and C4 complement proteinase   Peripheral vascular disease (HCC) 08/19/2016   Last Assessment & Plan:  Formatting of this note might be different from the original. Bilateral popliteal DVT seen on admission Occlusion of the left common iliac with  collateral blood flow to common femoral artery LLE angiogram on 08/22/16 for attempt at revascularization to optimize blood flow for healing Left AKA 7/24 I&D 7/31 and 8/2 with further procedures planned.  ASA 81 mg daily PT consult   Pulmonary embolism without acute cor pulmonale (HCC) 08/18/2016   Last Assessment & Plan:  Formatting of this note might be different from the original. 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 Added automatically from request for surgery [id number]  Last Assessment & Plan:  Formatting of this note might be different from the original. Presented with large area of necrotic tissue LLE Bilateral popliteal DVT Etiology uncertain:  Ischemic vs vasculitis vs autoimmune vs possible occult malignancy Bx: leukocytoblastic vasculopathy  In   Tobacco use 08/18/2016   Last Assessment & Plan:  Formatting of this note might be different from the original. Patient has a 30+ pack year history of smoking Discussed low dose CT chest for cancer screening at admission, patient open to this but concerned about extra cost as she has no insurance at this time. No obvious suspicious lesions noted on CTA PE study.  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   Peripheral vascular disease (HCC) 08/19/2016   Deep vein thrombosis (DVT) of popliteal vein of both lower extremities (HCC) 08/18/2016   Pulmonary embolism without acute cor pulmonale (HCC) 08/18/2016   Tobacco use 08/18/2016   Open leg wound, left, sequela 08/17/2016   Surgical wound, non healing 08/16/2016   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   Benign essential HTN 08/14/2016   Dizziness 07/19/2016   Nausea 07/19/2016   Necrosis (HCC) 07/19/2016   Mycobacterial infection 06/07/2016   Lymphangitis 06/07/2016   Mycobacterium infection, 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   Leukocytosis 07/30/2014   Depression with anxiety 09/13/2012    Past Surgical History:  Procedure Laterality Date   FOOT SURGERY     Left   HIP PINNING     3 each hip   IRRIGATION AND DEBRIDEMENT ABSCESS N/A 06/27/2016   Procedure: IRRIGATION AND DEBRIDEMENT LEFT LEG WITH SOTFT TISSUE BIOPSY;  Surgeon: Fredricka Bonine,  Lady Deutscher, MD;  Location: [facility];  Service: General;  Laterality: N/A;   LEG AMPUTATION Left    LYMPH NODE BIOPSY Left 06/09/2016   Procedure: BIOPSY LEFT LEG;  Surgeon: Peggye Form, DO;  Location: [facility];  Service: Plastics;  Laterality: Left;   SLIPPED CAPITAL FEMORAL EPIPHYSIS PINNING      OB History   No obstetric history on file.      Home Medications    Prior to Admission medications   Medication Sig Start Date End Date Taking? Authorizing Provider  cephALEXin (KEFLEX) 500 MG capsule Take 1 capsule (500 mg total) by mouth 4 (four) times daily for 7 days. 07/23/22 07/30/22 Yes Radford Pax, NP  mupirocin cream (BACTROBAN) 2 % Apply 1 Application topically 2 (two) times daily. 07/23/22  Yes Radford Pax, NP  albuterol (VENTOLIN HFA) 108 (90 Base) MCG/ACT inhaler Inhale 2 puffs into the lungs every 6 (six)  hours as needed. 04/25/22   Saguier, Ramon Dredge, PA-C  ALPRAZolam Prudy Feeler) 0.5 MG tablet Take 1 tablet (0.5 mg total) by mouth 2 (two) times daily as needed for anxiety. 06/20/22   Saguier, Ramon Dredge, PA-C  amLODipine (NORVASC) 2.5 MG tablet Take 1 tablet (2.5 mg total) by mouth daily. 05/22/22   Saguier, Ramon Dredge, PA-C  benazepril (LOTENSIN) 20 MG tablet Take 1 tablet (20 mg total) by mouth 2 (two) times daily. 03/21/22 03/21/23  Saguier, Ramon Dredge, PA-C  fluticasone (FLONASE) 50 MCG/ACT nasal spray Place 2 sprays into both nostrils daily. 07/13/21   Saguier, Ramon Dredge, PA-C  folic acid (FOLVITE) 1 MG tablet Take 1 tablet (1 mg total) by mouth daily. 10/25/21   Metheney, Jamesetta Orleans, MD  gabapentin (NEURONTIN) 300 MG capsule Take 1 capsule (300 mg total) by mouth at bedtime. 03/20/22 03/20/23  Saguier, Ramon Dredge, PA-C  methotrexate (RHEUMATREX) 2.5 MG tablet Take 6 tablets (15 mg total) by mouth once a week. Caution: Chemotherapy. Protect from light. 06/14/22   Burck, Jamesetta Orleans, MD  nystatin cream (MYCOSTATIN) Apply 1 Application topically 2 (two) times daily. 06/26/22   Fuller Plan, MD  warfarin (COUMADIN) 5 MG tablet TAKE 1 TABLET BY MOUTH ONCE DAILY 08/19/21 08/19/22  Saguier, Ramon Dredge, PA-C  DULoxetine (CYMBALTA) 30 MG capsule Take 1 capsule (30 mg total) by mouth daily. 02/02/20 03/03/20  Saguier, Ramon Dredge, PA-C    Family History Family History  Problem Relation Age of Onset   Hypertension Mother    Hyperlipidemia Mother    Osteoarthritis Mother    Rheum arthritis Mother    Hypertension Brother    Asthma Brother    Osteoarthritis Brother     Social History Social History   Tobacco Use   Smoking status: Former    Packs/day: 1.00    Years: 35.00    Additional pack years: 0.00    Total pack years: 35.00    Types: Cigarettes    Quit date: 09/14/2016    Years since quitting: 5.8    Passive exposure: Past   Smokeless tobacco: Never  Vaping Use   Vaping Use: Never used  Substance Use Topics   Alcohol use:  No    Alcohol/week: 0.0 standard drinks of alcohol   Drug use: No     Allergies   Avelox [moxifloxacin hcl in nacl], Montelukast sodium, and Pravastatin   Review of Systems Review of Systems  Skin:        Skin infection of left amputation site     Physical Exam Triage Vital Signs ED Triage Vitals  Enc Vitals Group     BP 07/23/22 1347 118/68     Pulse Rate 07/23/22 1343 (!) 55     Resp 07/23/22 1343 18     Temp 07/23/22 1343 97.8 F (36.6 C)     Temp Source 07/23/22 1343 Oral     SpO2 07/23/22 1343 94 %     Weight --      Height --      Head Circumference --      Peak Flow --      Pain Score 07/23/22 1342 6     Pain Loc --      Pain Edu? --      Excl. in GC? --    No data found.  Updated Vital Signs BP 118/68   Pulse (!) 55   Temp 97.8 F (36.6 C) (Oral)   Resp 18   SpO2 94%   Visual Acuity Right Eye Distance:   Left Eye Distance:   Bilateral Distance:    Right Eye Near:   Left Eye Near:    Bilateral Near:     Physical Exam Vitals and nursing note reviewed.  Constitutional:      General: She is not in acute distress.    Appearance: Normal appearance. She is not ill-appearing.  HENT:     Head: Normocephalic and atraumatic.  Eyes:     Pupils: Pupils are equal, round, and reactive to light.  Cardiovascular:     Rate and Rhythm: Normal rate.  Pulmonary:     Effort: Pulmonary effort is normal.  Skin:    General: Skin is warm and dry.          Comments: There is redness, purulent drainage, significant tenderness to the right inner folds of the skin of the left AKA stump.  Mild odor.  Neurological:     General: No focal deficit present.     Mental Status: She is alert and oriented to person, place, and time.  Psychiatric:        Mood and Affect: Mood normal.        Behavior: Behavior normal.      UC Treatments / Results  Labs (all labs ordered are listed, but only abnormal results are displayed) Labs Reviewed - No data to  display  COMPLETE METABOLIC PANEL WITH GFR Order: [id number] Status: Final result     Visible to patient: Yes (seen)     Next appt: 08/01/2022 at 11:30 AM in Internal Medicine ([facility])     Dx: High risk medication use   2 Result Notes          Component Ref Range & Units 1 mo ago (06/14/22) 4 mo ago (03/16/22) 7 mo ago (12/08/21) 9 mo ago (10/07/21) 1 yr ago (04/05/21) 2 yr ago (05/18/20) 2 yr ago (05/05/20)  Glucose, Bld 65 - 99 mg/dL 84 82 CM 81 CM 147 High  CM 78 R 107 High  R, CM 86 R  Comment: .            Fasting reference interval .  BUN 7 - 25 mg/dL 17 15 18 27  High  24 High  R 25 High  R 24 High  R  Creat 0.50 - 1.05 mg/dL 8.29 High  5.62 1.30 High  1.18 High  1.22 High  R 1.24 High  R 1.01 R  eGFR > OR = 60 mL/min/1.18m2 54 Low  60 51 Low  51 Low  BUN/Creatinine Ratio 6 - 22 (calc) 15 SEE NOTE: CM 15 23 High      Sodium 135 - 146 mmol/L 141 141 142 139 137 R 137 R 139 R  Potassium 3.5 - 5.3 mmol/L 4.5 4.7 4.8 4.9 5.3 High  R 4.6 R 5.1 R  Chloride 98 - 110 mmol/L 106 105 107 106 107 R 104 R 106 R  CO2 20 - 32 mmol/L 27 29 27 25 24  R 27 R 28 R  Calcium 8.6 - 10.4 mg/dL 8.4 Low  8.5 Low  8.6 8.8 8.8 R 8.8 Low  R 9.1 R  Total Protein 6.1 - 8.1 g/dL 6.4 6.9 6.9 6.7 7.1 R 6.8 R 7.1 R  Albumin 3.6 - 5.1 g/dL 3.4 Low  3.7 3.7 3.7     Globulin 1.9 - 3.7 g/dL (calc) 3.0 3.2 3.2 3.0     AG Ratio 1.0 - 2.5 (calc) 1.1 1.2 1.2 1.2     Total Bilirubin 0.2 - 1.2 mg/dL 0.5 0.3 0.4 0.3 0.5 0.3 R 0.5  Alkaline phosphatase (APISO) 37 - 153 U/L 78 94 73 81     AST 10 - 35 U/L 15 23 21 15 12  R 16 R 14 R  ALT 6 - 29 U/L 12 15 14 11 8  R 11 R 12 R  Resulting Agency [facility] [place] [facility] [place] [facility] [place] [facility] [place] [facility]         Resulting Agency's Comment  Performing Organization Information:     [facility] (CLIA: [id number])     Name: Tawni Millers     Address: [address] [place], [place] [address]     Director: Marica Otter MD    Specimen Collected: 06/14/22 11:00 Last Resulted: 06/15/22 01:22        EKG   Radiology No results found.  Procedures Procedures (including critical care time)  Medications Ordered in UC Medications - No data to display  Initial Impression / Assessment and Plan / UC Course  I have reviewed the triage vital signs and the nursing notes.  Pertinent labs & imaging results that were available during my care of the patient were reviewed by me and considered in my medical decision making (see chart for details).     Viewed symptoms and exam with patient.  She is well-appearing and in no acute distress.  She is afebrile in clinic and blood pressure is normal.  She is slightly bradycardic at 55 which is normal for her.  No signs or symptoms of SIRS.  Area was cleansed and dressed by nursing staff.  Will start Keflex and mupirocin.  As patient is on Coumadin advise she contact her Coumadin clinic to make them aware she is on antibiotics as she may need frequent INR checks during this time.  Wound care was reviewed with the patient.  Advised patient to follow-up with her PCP thank you 1 to 2 days for recheck.  I also instructed the patient if she develops any additional fevers and/or any worsening symptoms, red flags reviewed, she is go to the ER ASAP and she verbalized understanding Final Clinical Impressions(s) / UC Diagnoses   Final diagnoses:  Cellulitis of left lower extremity     Discharge Instructions      Please keep the area clean and dry.  Apply mupirocin antibiotic ointment twice daily Start Keflex 4 times a day for 7 days.  Please note this medication can  interfere with your INR and increase your risk of bleeding.  Please contact your Coumadin clinic to make them where you are on antibiotics as they may need to check your INR more frequently. Please follow-up  with your PCP in 1 to 2 days for recheck Please go to the ER ASAP if you develop any additional fevers or any worsening symptoms of your infection site. I hope you feel better soon!     ED Prescriptions     Medication Sig Dispense Auth. Provider   cephALEXin (KEFLEX) 500 MG capsule Take 1 capsule (500 mg total) by mouth 4 (four) times daily for 7 days. 28 capsule Radford Pax, NP   mupirocin cream (BACTROBAN) 2 % Apply 1 Application topically 2 (two) times daily. 30 g Radford Pax, NP      PDMP not reviewed this encounter.   Radford Pax, NP 07/23/22 1426    Radford Pax, NP 07/23/22 1427

## 2022-07-23 NOTE — Discharge Instructions (Addendum)
Please keep the area clean and dry.  Apply mupirocin antibiotic ointment twice daily Start Keflex 4 times a day for 7 days.  Please note this medication can interfere with your INR and increase your risk of bleeding.  Please contact your Coumadin clinic to make them where you are on antibiotics as they may need to check your INR more frequently. Please follow-up with your PCP in 1 to 2 days for recheck Please go to the ER ASAP if you develop any additional fevers or any worsening symptoms of your infection site. I hope you feel better soon!

## 2022-07-23 NOTE — ED Triage Notes (Signed)
Pt presents with c/o an abscess to the left upper extremity of leg X 4 days. States the abscess has blisters and has a odor.

## 2022-07-24 ENCOUNTER — Telehealth: Payer: Self-pay

## 2022-07-24 NOTE — Telephone Encounter (Signed)
Pt reports in ER yesterday for cellulitis of LLE. Prescribed Keflex and advised to check in with coumadin clinic due to possible interaction.   Pt denies any s/s of abnormal bruising or bleeding. Advised if any s/s to go to ER. Advised pt should have INR checked this week due to a potential of interaction with warfarin. Scheduled pt for 6/21. Pt verbalized understanding.

## 2022-07-27 NOTE — Telephone Encounter (Signed)
Pt called to [facility] coumadin clinic apt from tomorrow to 6/25. Pt reports she forgot she has an apt with DMV tomorrow and will need to keep that apt. Pt denies any changes and denies any s/s of bleeding or abnormal bruising. Advised if any s/s to contact office or go to ER. Pt verbalized understanding.

## 2022-07-31 ENCOUNTER — Other Ambulatory Visit: Payer: Self-pay | Admitting: Medical

## 2022-07-31 ENCOUNTER — Ambulatory Visit (HOSPITAL_BASED_OUTPATIENT_CLINIC_OR_DEPARTMENT_OTHER)
Admission: RE | Admit: 2022-07-31 | Discharge: 2022-07-31 | Disposition: A | Discharge status: Home / Self Care | Payer: Medicare Other | Source: Ambulatory Visit | Attending: Medical | Admitting: Medical

## 2022-07-31 ENCOUNTER — Other Ambulatory Visit (HOSPITAL_BASED_OUTPATIENT_CLINIC_OR_DEPARTMENT_OTHER): Payer: Self-pay

## 2022-07-31 ENCOUNTER — Ambulatory Visit (INDEPENDENT_AMBULATORY_CARE_PROVIDER_SITE_OTHER): Payer: Medicare Other | Admitting: Medical

## 2022-07-31 VITALS — BP 106/62 | HR 56 | Ht 69.0 in | Wt 250.0 lb

## 2022-07-31 DIAGNOSIS — R944 Abnormal results of kidney function studies: Secondary | ICD-10-CM

## 2022-07-31 DIAGNOSIS — T148XXA Other injury of unspecified body region, initial encounter: Secondary | ICD-10-CM

## 2022-07-31 DIAGNOSIS — T8740 Infection of amputation stump, unspecified extremity: Secondary | ICD-10-CM | POA: Insufficient documentation

## 2022-07-31 DIAGNOSIS — L089 Local infection of the skin and subcutaneous tissue, unspecified: Secondary | ICD-10-CM | POA: Diagnosis not present

## 2022-07-31 DIAGNOSIS — T148XXD Other injury of unspecified body region, subsequent encounter: Secondary | ICD-10-CM

## 2022-07-31 DIAGNOSIS — B49 Unspecified mycosis: Secondary | ICD-10-CM

## 2022-07-31 DIAGNOSIS — R739 Hyperglycemia, unspecified: Secondary | ICD-10-CM

## 2022-07-31 DIAGNOSIS — E875 Hyperkalemia: Secondary | ICD-10-CM

## 2022-07-31 LAB — COMPREHENSIVE METABOLIC PANEL
ALT: 11 U/L (ref 0–35)
AST: 12 U/L (ref 0–37)
Albumin: 3.6 g/dL (ref 3.5–5.2)
Alkaline Phosphatase: 75 U/L (ref 39–117)
BUN: 34 mg/dL — ABNORMAL HIGH (ref 6–23)
CO2: 26 mEq/L (ref 19–32)
Calcium: 8.9 mg/dL (ref 8.4–10.5)
Chloride: 106 mEq/L (ref 96–112)
Creatinine, Ser: 1.43 mg/dL — ABNORMAL HIGH (ref 0.40–1.20)
GFR: 37.61 mL/min — ABNORMAL LOW (ref >60.00)
Glucose, Bld: 85 mg/dL (ref 70–99)
Potassium: 5.6 mEq/L — ABNORMAL HIGH (ref 3.5–5.1)
Sodium: 137 mEq/L (ref 135–145)
Total Bilirubin: 0.5 mg/dL (ref 0.2–1.2)
Total Protein: 6.9 g/dL (ref 6.0–8.3)

## 2022-07-31 LAB — CBC WITH DIFFERENTIAL/PLATELET
Basophils Absolute: 0 10*3/uL (ref 0.0–0.1)
Basophils Relative: 1 % (ref 0.0–3.0)
Eosinophils Absolute: 0.4 10*3/uL (ref 0.0–0.7)
Eosinophils Relative: 7.9 % — ABNORMAL HIGH (ref 0.0–5.0)
HCT: 36.1 % (ref 36.0–46.0)
Hemoglobin: 11.5 g/dL — ABNORMAL LOW (ref 12.0–15.0)
Lymphocytes Relative: 21.2 % (ref 12.0–46.0)
Lymphs Abs: 0.9 10*3/uL (ref 0.7–4.0)
MCHC: 32 g/dL (ref 30.0–36.0)
MCV: 94.1 fl (ref 78.0–100.0)
Monocytes Absolute: 0.4 10*3/uL (ref 0.1–1.0)
Monocytes Relative: 9.1 % (ref 3.0–12.0)
Neutro Abs: 2.7 10*3/uL (ref 1.4–7.7)
Neutrophils Relative %: 60.8 % (ref 43.0–77.0)
Platelets: 214 10*3/uL (ref 150.0–400.0)
RBC: 3.84 Mil/uL — ABNORMAL LOW (ref 3.87–5.11)
RDW: 16 % — ABNORMAL HIGH (ref 11.5–15.5)
WBC: 4.5 10*3/uL (ref 4.0–10.5)

## 2022-07-31 LAB — HEMOGLOBIN A1C: Hgb A1c MFr Bld: 5.7 % (ref 4.6–6.5)

## 2022-07-31 MED ORDER — SODIUM POLYSTYRENE SULFONATE 15 GM/60ML PO SUSP
15.0000 g | Freq: Every day | ORAL | 0 refills | Status: DC
  Filled 2022-07-31: qty 92, 1d supply, fill #0
  Filled 2022-08-01 (×2): qty 240, 4d supply, fill #0

## 2022-07-31 MED ORDER — CEPHALEXIN 500 MG PO CAPS
500.0000 mg | ORAL_CAPSULE | Freq: Two times a day (BID) | ORAL | 0 refills | Status: DC
  Filled 2022-07-31: qty 6, 3d supply, fill #0

## 2022-07-31 NOTE — Patient Instructions (Addendum)
Wound/Skin infection, Amputation stump infection (HCC) and  Delayed wound healing Continue  keflex pending wound culture. - CBC w/Diff - Wound culture   Elevated blood sugar -cmp - Hemoglobin A1c   Amputation stump infection (HCC) - DG FEMUR PORT MIN 2 VIEWS LEFT; Future - Ambulatory referral to Wound Clinic   Fungal infection I think infection under breast is resolved. Know I thnk you just have scratch in left side lateral breast area. Use mupirocin to that area.  Delayed wound healing Based on history best to try to get you in with specialist. - Ambulatory referral to Wound Clinic  -also want you to get in touch with rheumatologist. Ask in light of recent stump infection should you hold rheumatology meds.  Follow up we or Thursday with provider in office to recheck wound.

## 2022-07-31 NOTE — Addendum Note (Signed)
Addended by: Gwenevere Abbot on: 07/31/2022 10:22 PM   Modules accepted: Orders

## 2022-07-31 NOTE — Progress Notes (Signed)
Subjective:    Patient ID: Rita Watkins, female    DOB: Oct 14, 1955, 67 y.o.   MRN: [id number]  HPI Pt in for follow up from the ED on 07-23-2022.  Rita Watkins is a 67 y.o. female presents for a skin infection.  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  She states over the past 4 days she has noticed a odor, redness and drainage from the stump.  She states she tries her best to keep the area clean and is had no issues with infections since the amputation.  She does states she has had intermittent fevers from 99-1 01.  She is currently afebrile in clinic.  States she feels fine and does not feel ill or fatigued.  Denies history of MRSA.  She has not applied any OTC medications to the area since symptoms began.  She is on Coumadin and follows with the Coumadin clinic for her INR.  Last INR 1.9.  No other concerns this time.   Left lower ext- Comments: There is redness, purulent drainage, significant tenderness to the right inner folds of the skin of the left AKA stump.  Mild odor.    Labs in the ED decreased gfr.   A/P  Pertinent labs & imaging results that were available during my care of the patient were reviewed by me and considered in my medical decision making (see chart for details).     Viewed symptoms and exam with patient.  She is well-appearing and in no acute distress.  She is afebrile in clinic and blood pressure is normal.  She is slightly bradycardic at 55 which is normal for her.  No signs or symptoms of SIRS.  Area was cleansed and dressed by nursing staff.  Will start Keflex and mupirocin.  As patient is on Coumadin advise she contact her Coumadin clinic to make them aware she is on antibiotics as she may need frequent INR checks during this time.  Wound care was reviewed with the patient.  Advised patient to follow-up with her PCP thank you 1 to 2 days for recheck.  I also instructed the patient if she develops any additional fevers and/or any  worsening symptoms, red flags reviewed, she is go to the ER ASAP and she verbalized understanding  No wound culture done in the ED.   Pt states with keflex she thinks the area has improved. But she states area has not improved completely.  Pt states had foul smell before was on antibiotic. After antibiotic did not have fever. She thinks her stump is overall mild swollen and it is mild tender.   Also recent red rash under breast since end of may. She has been using nystatin. Helped rt side but did not help left side.    Review of Systems  Constitutional:  Negative for chills, fatigue and fever.  Respiratory:  Negative for cough, chest tightness, shortness of breath and wheezing.   Cardiovascular:  Negative for chest pain and palpitations.  Gastrointestinal:  Negative for abdominal pain.  Skin:  Positive for rash.  Neurological:  Negative for dizziness, tremors, weakness, numbness and headaches.  Hematological:  Negative for adenopathy. Does not bruise/bleed easily.  Psychiatric/Behavioral:  Negative for behavioral problems and decreased concentration. The patient is not nervous/anxious.     Past Medical History:  Diagnosis Date   Allergy    Anemia associated with acute blood loss 08/22/2016   Formatting of this note is different  from the original.  CT abdomen 7/17:   1.  Large left retroperitoneal hematoma with mixed density blood products.  Assessment for active bleeding is not possible without IV contrast. 2.  Contrast in the renal collecting systems presumably from contrast enhanced studies performed on August 20, 2016 consistent with known impaired renal function.   CT abdomen 7/27:    Anxiety    Benign essential HTN 08/14/2016   Cardiac murmur 04/20/2020   Cigarette smoker 07/15/2014   COPD (chronic obstructive pulmonary disease) (HCC)    COPD (chronic obstructive pulmonary disease) (HCC)    Deep vein thrombosis (DVT) of popliteal vein of both lower extremities (HCC) 08/18/2016    Last Assessment & Plan:  Formatting of this note might be different from the original. Not clear if provoked/unprovoked (pt had trauma to left toe back in 03/2016 with some decreased mobility thereafter) CT chest confirmed acute PE and also concerning for some bilateral upper lobe groundglass opacities Hematology on board and heparin drip started but  discontinued 7/17  2/2 large retroperitoneal he   Depression    Depression with anxiety 09/13/2012   Dizziness 07/19/2016   Essential hypertension 04/20/2020   History of pulmonary embolism 04/20/2020   Hyperlipidemia    Hypertension    Leukocytosis 07/30/2014   Lymphangitis 06/07/2016   Mycobacterial infection 06/07/2016   Mycobacterial infection 06/07/2016   Mycobacterium infection, 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 Per dermatology: Please provide the patient with our outpatient clinic phone number [phone]) to call and schedule a follow up appointment for continued management.   Last Assessment & Plan:  Formatting of this note might be different from the original. Uncertain etiology Vasculitic vs infectious vs malignancy Extensive work up has   Obesity 09/20/2016   Last Assessment & Plan:  Formatting of this note might be different from the original. Body mass index is 22.67 kg/m.  - For weight loss counseling.   Obesity (BMI 30.0-34.9) 04/20/2020   Open leg wound, left, sequela 08/17/2016   Formatting of this note might be different from the original. - Chronic left wound starting in April 2018 and worsening despite antibiotics and debridement -Patient had multiple hospitalizations and was treated with steroids and antibiotics with worsening/extension of the wound - ESR 103, CRP 168 - ANA, factor V Leyden, Antithrombin III, Cardiolite and antibody IgG, C3 and C4 complement proteinase   Peripheral  vascular disease (HCC) 08/19/2016   Last Assessment & Plan:  Formatting of this note might be different from the original. 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  ASA 81 mg daily PT consult   Pulmonary embolism without acute cor pulmonale (HCC) 08/18/2016   Last Assessment & Plan:  Formatting of this note might be different from the original. Uncertain etiology  Was on heparin gtt but discontinued 2/2 large retroperitoneal hematoma (now stable on follow up CT) O2 PRN IVC placed  Vascular following No SOB, SpO2 93% on room air On warfarin, on hold due to supratherapeutic INR, will restart at 3mg  once able   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   Surgical wound, non healing 08/16/2016   Formatting of this note might be different  from the original. Added automatically from request for surgery [phone]  Last Assessment & Plan:  Formatting of this note might be different from the original. Presented with large area of necrotic tissue LLE Bilateral popliteal DVT Etiology uncertain:  Ischemic vs vasculitis vs autoimmune vs possible occult malignancy Bx: leukocytoblastic vasculopathy  In   Tobacco use 08/18/2016   Last Assessment & Plan:  Formatting of this note might be different from the original. Patient has a 30+ pack year history of smoking Discussed low dose CT chest for cancer screening at admission, patient open to this but concerned about extra cost as she has no insurance at this time. No obvious suspicious lesions noted on CTA PE study.  Counseled regarding tobacco cessation     Social History   Socioeconomic History   Marital status: Widowed    Spouse name: Not on file   Number of children: Not on file   Years of education: high schoo   Highest education  level: 12th grade  Occupational History   Occupation: ADM. ASSISTANT    Employer: [employer]  Tobacco Use   Smoking status: Former    Packs/day: 1.00    Years: 35.00    Additional pack years: 0.00    Total pack years: 35.00    Types: Cigarettes    Quit date: 09/14/2016    Years since quitting: 5.8    Passive exposure: Past   Smokeless tobacco: Never  Vaping Use   Vaping Use: Never used  Substance and Sexual Activity   Alcohol use: No    Alcohol/week: 0.0 standard drinks of alcohol   Drug use: No   Sexual activity: Never  Other Topics Concern   Not on file  Social History Narrative   Does not exercise.   Social Determinants of Health   Financial Resource Strain: Low Risk  (07/28/2022)   Overall Financial Resource Strain (CARDIA)    Difficulty of Paying Living Expenses: Not very hard  Food Insecurity: No Food Insecurity (07/28/2022)   Hunger Vital Sign    Worried About Running Out of Food in the Last Year: Never true    Ran Out of Food in the Last Year: Never true  Transportation Needs: No Transportation Needs (07/28/2022)   PRAPARE - Administrator, Civil Service (Medical): No    Lack of Transportation (Non-Medical): No  Physical Activity: Unknown (07/28/2022)   Exercise Vital Sign    Days of Exercise per Week: 0 days    Minutes of Exercise per Session: Not on file  Stress: No Stress Concern Present (07/28/2022)   [employer] of Occupational Health - Occupational Stress Questionnaire    Feeling of Stress : Not at all  Social Connections: Moderately Integrated (07/28/2022)   Social Connection and Isolation Panel [NHANES]    Frequency of Communication with Friends and Family: More than three times a week    Frequency of Social Gatherings with Friends and Family: Once a week    Attends Religious Services: More than 4 times per year    Active Member of [employer] or Organizations: Yes    Attends Banker Meetings: 1 to 4 times per year    Marital Status:  Widowed  Intimate Partner Violence: Not At Risk (10/06/2020)   Humiliation, Afraid, Rape, and Kick questionnaire    Fear of Current or Ex-Partner: No    Emotionally Abused: No    Physically Abused: No    Sexually Abused: No    Past Surgical History:  Procedure  Laterality Date   FOOT SURGERY     Left   HIP PINNING     3 each hip   IRRIGATION AND DEBRIDEMENT ABSCESS N/A 06/27/2016   Procedure: IRRIGATION AND DEBRIDEMENT LEFT LEG WITH SOTFT TISSUE BIOPSY;  Surgeon: Berna Bue, MD;  Location: [facility];  Service: General;  Laterality: N/A;   LEG AMPUTATION Left    LYMPH NODE BIOPSY Left 06/09/2016   Procedure: BIOPSY LEFT LEG;  Surgeon: Peggye Form, DO;  Location: [facility];  Service: Plastics;  Laterality: Left;   SLIPPED CAPITAL FEMORAL EPIPHYSIS PINNING      Family History  Problem Relation Age of Onset   Hypertension Mother    Hyperlipidemia Mother    Osteoarthritis Mother    Rheum arthritis Mother    Hypertension Brother    Asthma Brother    Osteoarthritis Brother     Allergies  Allergen Reactions   Avelox [Moxifloxacin Hcl In Nacl] Anaphylaxis   Montelukast Sodium Other (See Comments)    Insomnia   Pravastatin Other (See Comments)    Myalgias on 20mg     Current Outpatient Medications on File Prior to Visit  Medication Sig Dispense Refill   albuterol (VENTOLIN HFA) 108 (90 Base) MCG/ACT inhaler Inhale 2 puffs into the lungs every 6 (six) hours as needed. 6.7 g 0   ALPRAZolam (XANAX) 0.5 MG tablet Take 1 tablet (0.5 mg total) by mouth 2 (two) times daily as needed for anxiety. 60 tablet 1   amLODipine (NORVASC) 2.5 MG tablet Take 1 tablet (2.5 mg total) by mouth daily. 90 tablet 1   benazepril (LOTENSIN) 20 MG tablet Take 1 tablet (20 mg total) by mouth 2 (two) times daily. 180 tablet 3   fluticasone (FLONASE) 50 MCG/ACT nasal spray Place 2 sprays into both nostrils daily. 16 g 1   folic acid (FOLVITE) 1 MG tablet Take 1 tablet (1 mg total) by mouth daily.  90 tablet 3   gabapentin (NEURONTIN) 300 MG capsule Take 1 capsule (300 mg total) by mouth at bedtime. 90 capsule 3   methotrexate (RHEUMATREX) 2.5 MG tablet Take 6 tablets (15 mg total) by mouth once a week. Caution: Chemotherapy. Protect from light. 78 tablet 0   mupirocin cream (BACTROBAN) 2 % Apply 1 Application topically 2 (two) times daily. 30 g 0   nystatin cream (MYCOSTATIN) Apply 1 Application topically 2 (two) times daily. 30 g 0   warfarin (COUMADIN) 5 MG tablet TAKE 1 TABLET BY MOUTH ONCE DAILY 30 tablet 5   [DISCONTINUED] DULoxetine (CYMBALTA) 30 MG capsule Take 1 capsule (30 mg total) by mouth daily. 30 capsule 0   No current facility-administered medications on file prior to visit.    BP 106/62   Pulse (!) 56   Ht 5\' 9"  (1.753 m)   Wt 250 lb (113.4 kg)   SpO2 95%   BMI 36.92 kg/m        Objective:   Physical Exam  General Mental Status- Alert. General Appearance- Not in acute distress.   Skin General: Color- Normal Color. Moisture- Normal Moisture.  Neck Carotid Arteries- Normal color. Moisture- Normal Moisture. No carotid bruits. No JVD.  Chest and Lung Exam Auscultation: Breath Sounds:-Normal.  Cardiovascular Auscultation:Rythm- Regular. Murmurs & Other Heart Sounds:Auscultation of the heart reveals- No Murmurs.  Abdomen Inspection:-Inspeection Normal. Palpation/Percussion:Note:No mass. Palpation and Percussion of the abdomen reveal- Non Tender, Non Distended + BS, no rebound or guarding.  Neurologic Cranial Nerve exam:- CN III-XII intact(No nystagmus), symmetric smile. Strength:-  5/5 equal and symmetric strength both upper and lower extremities.   Left lower ext- stump appears mild swollen. Mild tender to palpation. No redness. Distal aspect 1 cm wide shallow broken down area of skin with dry yellow dc but no active dc.   Left breast- lateral aspect 6 cm clean scratch like area. No dc. No redness. No white dc. (Pt questions early on it he may have  scratches herself with nail when sleeping as did have very red rash on both sides prior to tx with nystatin)      Assessment & Plan:   Patient Instructions  Wound/Skin infection, Amputation stump infection (HCC) and  Delayed wound healing Continue  keflex pending wound culture.  - Ambulatory referral to Wound Clinic - CBC w/Diff - Wound culture - Ambulatory referral to Wound Clinic  Elevated blood sugar -cmp - Hemoglobin A1c   Amputation stump infection (HCC) - DG FEMUR PORT MIN 2 VIEWS LEFT; Future - Ambulatory referral to Wound Clinic   Fungal infection I think infection under breast is resolved. Know I thnk you just have scratch in left side lateral breast area. Use mupirocin to that area.  Delayed wound healing Based on history best to try to get you in with specialist. - Ambulatory referral to Wound Clinic  -also want you to get in touch with reumatologist. Ask in light of recent stump infection should you hold rheumatology meds.  Follow up we or Thursday with provider in office to recheck wound.   Esperanza Richters, PA-C

## 2022-08-01 ENCOUNTER — Other Ambulatory Visit (HOSPITAL_BASED_OUTPATIENT_CLINIC_OR_DEPARTMENT_OTHER): Payer: Self-pay

## 2022-08-01 ENCOUNTER — Encounter: Payer: Self-pay | Admitting: Medical

## 2022-08-01 ENCOUNTER — Ambulatory Visit: Payer: Medicare Other

## 2022-08-01 ENCOUNTER — Other Ambulatory Visit: Payer: Self-pay

## 2022-08-01 ENCOUNTER — Ambulatory Visit (INDEPENDENT_AMBULATORY_CARE_PROVIDER_SITE_OTHER): Payer: Medicare Other

## 2022-08-01 DIAGNOSIS — Z7901 Long term (current) use of anticoagulants: Secondary | ICD-10-CM | POA: Diagnosis not present

## 2022-08-01 LAB — POCT INR: INR: 2.6 (ref 2.0–3.0)

## 2022-08-01 LAB — WOUND CULTURE: SPECIMEN QUALITY:: ADEQUATE

## 2022-08-01 NOTE — Progress Notes (Signed)
Pt currently on Keflex for 3 more days.  Continue 1/2 tablet daily except take 1 tablet on Mondays, Wednesdays and Fridays. Recheck in 4 weeks. Contact the coumadin clinic number at [phone], if any changes or any questions.

## 2022-08-01 NOTE — Patient Instructions (Addendum)
Pre visit review using our clinic review tool, if applicable. No additional management support is needed unless otherwise documented below in the visit note.  Continue 1/2 tablet daily except take 1 tablet on Mondays, Wednesdays and Fridays. Recheck in 4 weeks. Contact the coumadin clinic number at [phone], if any changes or any questions.

## 2022-08-02 ENCOUNTER — Other Ambulatory Visit (HOSPITAL_BASED_OUTPATIENT_CLINIC_OR_DEPARTMENT_OTHER): Payer: Self-pay

## 2022-08-02 LAB — WOUND CULTURE

## 2022-08-03 LAB — WOUND CULTURE: MICRO NUMBER:: 15118856

## 2022-08-07 ENCOUNTER — Other Ambulatory Visit (HOSPITAL_BASED_OUTPATIENT_CLINIC_OR_DEPARTMENT_OTHER): Payer: Self-pay

## 2022-08-07 ENCOUNTER — Telehealth: Payer: Self-pay

## 2022-08-07 DIAGNOSIS — Z7901 Long term (current) use of anticoagulants: Secondary | ICD-10-CM

## 2022-08-07 MED ORDER — WARFARIN SODIUM 5 MG PO TABS
ORAL_TABLET | ORAL | 1 refills | Status: DC
Start: 2022-08-07 — End: 2022-11-21
  Filled 2022-08-07: qty 75, 84d supply, fill #0
  Filled 2022-11-21: qty 75, 84d supply, fill #1

## 2022-08-07 NOTE — Telephone Encounter (Signed)
Pt LVM requesting refill of warfarin. ?Pt is compliant with warfarin management and PCP apts.  ?Sent in refill. ?

## 2022-08-08 ENCOUNTER — Other Ambulatory Visit (INDEPENDENT_AMBULATORY_CARE_PROVIDER_SITE_OTHER): Payer: Medicare Other

## 2022-08-08 DIAGNOSIS — E875 Hyperkalemia: Secondary | ICD-10-CM

## 2022-08-08 LAB — COMPREHENSIVE METABOLIC PANEL
ALT: 14 U/L (ref 0–35)
AST: 16 U/L (ref 0–37)
Albumin: 3.5 g/dL (ref 3.5–5.2)
Alkaline Phosphatase: 73 U/L (ref 39–117)
BUN: 19 mg/dL (ref 6–23)
CO2: 29 mEq/L (ref 19–32)
Calcium: 8.8 mg/dL (ref 8.4–10.5)
Chloride: 105 mEq/L (ref 96–112)
Creatinine, Ser: 1.09 mg/dL (ref 0.40–1.20)
GFR: 52.09 mL/min — ABNORMAL LOW (ref >60.00)
Glucose, Bld: 78 mg/dL (ref 70–99)
Potassium: 4.9 mEq/L (ref 3.5–5.1)
Sodium: 139 mEq/L (ref 135–145)
Total Bilirubin: 0.6 mg/dL (ref 0.2–1.2)
Total Protein: 6.6 g/dL (ref 6.0–8.3)

## 2022-08-22 ENCOUNTER — Other Ambulatory Visit: Payer: Self-pay | Admitting: Medical

## 2022-08-22 ENCOUNTER — Other Ambulatory Visit: Payer: Self-pay

## 2022-08-22 MED ORDER — ALPRAZOLAM 0.5 MG PO TABS
0.5000 mg | ORAL_TABLET | Freq: Two times a day (BID) | ORAL | 1 refills | Status: DC | PRN
  Filled 2022-08-22: qty 60, 30d supply, fill #0
  Filled 2022-09-22: qty 60, 30d supply, fill #1

## 2022-08-22 NOTE — Telephone Encounter (Signed)
Requesting: xanax Contract: 03/29/22 UDS:03/29/22 Last Visit:07/31/22 Next Visit:10/17/22 Last Refill:06/20/22  Please Advise

## 2022-08-22 NOTE — Telephone Encounter (Signed)
Rx xanax sent to pt pharmacy. 

## 2022-08-23 ENCOUNTER — Other Ambulatory Visit (HOSPITAL_BASED_OUTPATIENT_CLINIC_OR_DEPARTMENT_OTHER): Payer: Self-pay

## 2022-08-23 ENCOUNTER — Other Ambulatory Visit: Payer: Self-pay

## 2022-08-29 ENCOUNTER — Ambulatory Visit: Payer: Medicare Other

## 2022-09-05 ENCOUNTER — Ambulatory Visit (INDEPENDENT_AMBULATORY_CARE_PROVIDER_SITE_OTHER): Payer: Medicare Other

## 2022-09-05 DIAGNOSIS — Z7901 Long term (current) use of anticoagulants: Secondary | ICD-10-CM | POA: Diagnosis not present

## 2022-09-05 LAB — POCT INR: INR: 2.8 (ref 2.0–3.0)

## 2022-09-05 NOTE — Progress Notes (Signed)
Pt had elevated potassium so she was prescribed lokelma x 6. No interaction with warfarin. Continue 1/2 tablet daily except take 1 tablet on Mondays, Wednesdays and Fridays. Recheck in 4 weeks. Contact the coumadin clinic number at [phone], if any changes or any questions.

## 2022-09-05 NOTE — Patient Instructions (Addendum)
Pre visit review using our clinic review tool, if applicable. No additional management support is needed unless otherwise documented below in the visit note.  Continue 1/2 tablet daily except take 1 tablet on Mondays, Wednesdays and Fridays. Recheck in 4 weeks. Contact the coumadin clinic number at [phone], if any changes or any questions.

## 2022-09-06 ENCOUNTER — Encounter (INDEPENDENT_AMBULATORY_CARE_PROVIDER_SITE_OTHER): Payer: Self-pay

## 2022-09-07 NOTE — Progress Notes (Signed)
Office Visit Note  Patient: Rita Watkins             Date of Birth: 17-Jun-1955           MRN: [id number]             PCP: Marisue Brooklyn Referring: Marisue Brooklyn Visit Date: 09/18/2022   Subjective:  Follow-up (Patient states her thumbs will cramp and get stuck sometimes. Patient states the under side of her arm hurts when her thumbs cramp. Patient states sometimes the other fingers get stiff and stick together. )   History of Present Illness: PENNI KABACINSKI is a 67 y.o. 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 weekly folic acid 1 mg daily.  Since her last visit she had an infection at left leg stump with a small ulcer or dehiscence lesion.  This cleared up and she completed antibiotics course. Did not contact our office or hold medications at the time.  New problem since her last visit issue with cramping or getting stuck in flexed position affecting the thumbs on both hands and other MCP joints on her right hand.  This comes and goes without warning sometimes at rest sometimes with activity such as hand grafts or while driving.  Releases after a few minutes.  Otherwise not having any flareup or increase of joint pain and swelling.  Previous HPI 06/14/2022 CHARMIAN FERRAN is a 67 y.o. 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 weekly folic acid 1 mg daily.  Overall symptoms are doing well she has not had any particular exacerbation and does not experience any daily joint pain or swelling in her hands.  Still has issues with left shoulder pain typically more use related such as doing repetitive lifting working in her garden.  Rates it most days as around 3 out of 10 in severity but can get up to 10 out of 10 occasionally.   03/16/22 EMBRY LINWOOD is a 67 y.o. 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 weekly and folic acid 1 mg daily.  Joint pain and swelling in her hands are doing well and left  shoulder pain remains mostly improved.  She is noticing some increased pain and stiffness at the right shoulder which is a new problem.  It is not hurting at rest just with use and no radiation.  Still has some redness and irritation overlying the swelling at her right ankle with itching.   12/08/21 GHALIA BIBBS is a 67 y.o. female here for follow up for evaluation of inflammatory arthritis concern for RA with repeat joint swelling, pain, and increased sedimentation rate. Since starting the methotrexate 15 mg PO weekly she feels a good improvement in symptoms with almost no morning stiffness and no flare ups. Shoulder and hand pains are better. Still has about the same degree of leg swelling and redness on the lateral side. She does notice some headache after taking each weekly dose but it lasts less than one day duration. She notices occasional shoulder pain provoked with lifting cases of water.   10/25/2021 JAZZMON AVELLA is a 67 y.o. female here for follow up for evaluation of inflammatory arthritis concern for RA with repeat joint swelling, pain, and increased sedimentation rate and improvement on prednisone. Xrays at last visit showed no erosive disease and mild severity of OA changes. Lab tests were all negative for markers  of ANA-related disease process. CBC and CMP okay, does have mild renal impairment. Since our visit she has a flare up of left shoulder pain limiting mobility. Pain radiates into the arm when getting more severe. Her ankle remains the same amount swollen. No new episode of hand swelling.   Previous HPI 10/06/2021 JANAYA TRIOLA is a 67 y.o. 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 She developed pain and swelling involving ankle and in right 2nd finger that was treated with prednisone taper with good response in PCP office. Lab testing positive for RF and elevated uric acid level. She has had joint pain in multiple areas but symptoms significantly increased since earlier this  year.  In May she developed severe pain involving the left shoulder and right hand particularly her second finger.  She saw significant swelling in the hands and around the right ankle.  She was treated with a oral prednisone course lasting 6 days and the symptoms stayed improved for a little over 1 month.  She had a recurrence of joint swelling in July that also improved quickly with oral prednisone.  Due to the swelling and warmth around the ankle she is been treated with oral antibiotics that did not significantly affect her symptoms. 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  Was complicated by ultimately developing sepsis requiring hospital visit and prolonged nursing care treatment course.   Labs reviewed 06/2021 ANA 1:80 cytoplasmic reticular/AMA RF 40 Uric acid 8.3 ESR 33 CPR <1   Review of Systems  Constitutional:  Negative for fatigue.  HENT:  Negative for mouth sores and mouth dryness.   Eyes:  Negative for dryness.  Respiratory:  Negative for shortness of breath.   Cardiovascular:  Negative for chest pain and palpitations.  Gastrointestinal:  Negative for blood in stool, constipation and diarrhea.  Endocrine: Negative for increased urination.  Genitourinary:  Negative for involuntary urination.  Musculoskeletal:  Positive for joint pain, joint pain and morning stiffness. Negative for gait problem, joint swelling, myalgias, muscle weakness, muscle tenderness and myalgias.  Skin:  Positive for sensitivity to sunlight. Negative for color change, rash and hair loss.  Allergic/Immunologic: Negative for susceptible to infections.  Neurological:  Negative for dizziness and headaches.  Hematological:  Negative for swollen glands.  Psychiatric/Behavioral:  Negative for depressed mood and sleep disturbance.  The patient is not nervous/anxious.     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   Peripheral vascular disease (HCC) 08/19/2016   Deep vein thrombosis (DVT) of popliteal vein of both lower extremities (HCC) 08/18/2016   Pulmonary embolism without acute cor pulmonale (HCC) 08/18/2016   Tobacco use 08/18/2016   Open leg wound, left, sequela 08/17/2016   Surgical wound, non healing 08/16/2016   Pyoderma  gangrenosa 08/14/2016   Benign essential HTN 08/14/2016   Dizziness 07/19/2016   Nausea 07/19/2016   Necrosis (HCC) 07/19/2016   Mycobacterial infection 06/07/2016   Lymphangitis 06/07/2016   Mycobacterium infection, 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  CT abdomen 7/17:   1.  Large left retroperitoneal hematoma with mixed density blood products.  Assessment for active bleeding is not possible without IV contrast. 2.  Contrast in the renal collecting systems presumably from contrast enhanced studies performed on August 20, 2016 consistent with known impaired renal function.   CT abdomen 7/27:    Anxiety    Benign essential HTN 08/14/2016   Cardiac murmur 04/20/2020   Cigarette smoker  07/15/2014   COPD (chronic obstructive pulmonary disease) (HCC)    COPD (chronic obstructive pulmonary disease) (HCC)    Deep vein thrombosis (DVT) of popliteal vein of both lower extremities (HCC) 08/18/2016   Last Assessment & Plan:  Formatting of this note might be different from the original. Not clear if provoked/unprovoked (pt had trauma to left toe back in 03/2016 with some decreased mobility thereafter) CT chest confirmed acute PE and also concerning for some bilateral upper lobe groundglass opacities Hematology on board and heparin drip started but  discontinued 7/17  2/2 large retroperitoneal he   Depression    Depression with anxiety 09/13/2012   Dizziness 07/19/2016   Essential hypertension 04/20/2020   History of pulmonary embolism 04/20/2020   Hyperlipidemia    Hypertension    Leukocytosis 07/30/2014   Lymphangitis 06/07/2016   Mycobacterial infection 06/07/2016   Mycobacterial infection 06/07/2016   Mycobacterium infection, 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 Per dermatology: Please provide the patient with our outpatient clinic phone number [phone]) to call and schedule a follow up appointment for continued management.   Last Assessment & Plan:  Formatting of this note might be different from the original. Uncertain etiology Vasculitic vs infectious vs malignancy Extensive work up has   Obesity 09/20/2016   Last Assessment & Plan:  Formatting of this note might be different from the original. Body mass index is 22.67 kg/m.  - For weight loss counseling.   Obesity (BMI 30.0-34.9) 04/20/2020   Open leg wound, left, sequela 08/17/2016   Formatting of this note might be different from the original. - Chronic left wound starting in April 2018 and worsening despite antibiotics and debridement -Patient had multiple hospitalizations and was  treated with steroids and antibiotics with worsening/extension of the wound - ESR 103, CRP 168 - ANA, factor V Leyden, Antithrombin III, Cardiolite and antibody IgG, C3 and C4 complement proteinase   Peripheral vascular disease (HCC) 08/19/2016   Last Assessment & Plan:  Formatting of this note might be different from the original. 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  ASA 81 mg daily PT consult   Pulmonary embolism without acute cor pulmonale (HCC) 08/18/2016   Last Assessment & Plan:  Formatting of  this note might be different from the original. 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 Added automatically from request for surgery [id number]  Last Assessment & Plan:  Formatting of this note might be different from the original. Presented with large area of necrotic tissue LLE Bilateral popliteal DVT Etiology uncertain:  Ischemic vs vasculitis vs autoimmune vs possible occult malignancy Bx: leukocytoblastic vasculopathy  In   Tobacco use 08/18/2016   Last Assessment & Plan:  Formatting of this note might be different from the original. Patient has a 30+ pack year history of smoking Discussed low dose CT chest for cancer screening at admission, patient open to this but concerned about extra cost as she has no insurance at this time. No obvious suspicious lesions noted on CTA PE study.  Counseled regarding  tobacco cessation    Family History  Problem Relation Age of Onset   Hypertension Mother    Hyperlipidemia Mother    Osteoarthritis Mother    Rheum arthritis Mother    Hypertension Brother    Asthma Brother    Osteoarthritis Brother    Past Surgical History:  Procedure Laterality Date   FOOT SURGERY     Left   HIP PINNING     3 each hip   IRRIGATION AND DEBRIDEMENT ABSCESS N/A 06/27/2016   Procedure: IRRIGATION AND DEBRIDEMENT LEFT LEG WITH SOTFT TISSUE BIOPSY;  Surgeon: Berna Bue, MD;  Location: [facility];  Service: General;  Laterality: N/A;   LEG AMPUTATION Left    LYMPH NODE BIOPSY Left 06/09/2016   Procedure: BIOPSY LEFT LEG;  Surgeon: Peggye Form, DO;  Location: [facility];  Service: Plastics;  Laterality: Left;   SLIPPED CAPITAL FEMORAL EPIPHYSIS PINNING     Social History   Social History Narrative   Does not exercise.   Immunization History  Administered Date(s) Administered   COVID-19, mRNA, vaccine(Comirnaty)12 years and older 12/07/2021   Fluad Quad(high Dose 65+) 12/18/2018, 11/01/2020, 11/03/2021   Influenza Split 11/20/2013   Influenza Whole 11/10/2012   Influenza, High Dose Seasonal PF 11/29/2015   Influenza,inj,Quad PF,6+ Mos 10/29/2014, 11/15/2017   PFIZER Comirnaty(Gray Top)Covid-19 Tri-Sucrose Vaccine 05/19/2020   PFIZER(Purple Top)SARS-COV-2 Vaccination 04/20/2019, 05/10/2019   Pfizer Covid-19 Vaccine Bivalent Booster 27yrs & up 11/01/2020   Pneumococcal Conjugate-13 12/18/2018   Pneumococcal Polysaccharide-23 09/14/2011   Tdap 04/09/2008     Objective: Vital Signs: BP 116/68 (BP Location: Left Arm, Patient Position: Sitting, Cuff Size: Normal)   Pulse (!) 50   Resp 14   Ht 5\' 9"  (1.753 m)   Wt 225 lb (102.1 kg) Comment: patient in wheelchair  BMI 33.23 kg/m    Physical Exam Constitutional:      Appearance: She is obese.     Comments: In wheelchair  Eyes:     Conjunctiva/sclera: Conjunctivae normal.  Cardiovascular:     Rate  and Rhythm: Normal rate and regular rhythm.  Pulmonary:     Effort: Pulmonary effort is normal.     Breath sounds: Normal breath sounds.  Musculoskeletal:     Right lower leg: Edema present.     Comments: Right ankle and foot  edema overlying hyperpigmentation, erythema on anterior side above ankle  Lymphadenopathy:     Cervical: No cervical adenopathy.  Skin:    General: Skin is warm and dry.  Neurological:     Mental Status: She is alert.  Psychiatric:        Mood and Affect: Mood normal.      Musculoskeletal Exam:  Shoulders slightly restricted active overhead abduction, no tenderness or swelling Elbows full ROM no tenderness or swelling Wrists full ROM no tenderness or swelling Fingers full ROM, crepitus first CMC joint without tenderness or palpable swelling, right 3rd MCP and PIP tenderness to pressure Left leg AKA   Investigation: No additional findings.  Imaging: No results found.  Recent Labs: Lab Results  Component Value Date   WBC 4.5 07/31/2022   HGB 11.5 (L) 07/31/2022   PLT 214.0 07/31/2022   NA 139 08/08/2022   K 4.9 08/08/2022   CL 105 08/08/2022   CO2 29 08/08/2022   GLUCOSE 78 08/08/2022   BUN 19 08/08/2022   CREATININE 1.09 08/08/2022   BILITOT 0.6 08/08/2022   ALKPHOS 73 08/08/2022   AST 16 08/08/2022   ALT 14 08/08/2022   PROT 6.6 08/08/2022   ALBUMIN 3.5 08/08/2022   CALCIUM 8.8 08/08/2022   GFRAA 72 08/05/2019    Speciality Comments: No specialty comments available.  Procedures:  No procedures performed Allergies: Avelox [moxifloxacin hcl in nacl], Montelukast sodium, and Pravastatin   Assessment / Plan:     Visit Diagnoses: Seropositive rheumatoid arthritis (HCC) - Plan: Sedimentation rate, methotrexate (RHEUMATREX) 2.5 MG tablet, folic acid (FOLVITE) 1 MG tablet  Looks to be in low disease activity she has had some persistent raised inflammatory markers at last visit as well.  Rechecking sed rate for disease activity monitoring.   If this is increased from before might account for the additional problem with her hands.  Plan to continue methotrexate 15 mg p.o. weekly folic acid 1 mg daily.  High risk medication use - methotrexate 15 mg p.o. weekly and folic acid 1 mg daily. - Plan: CBC with Differential/Platelet, COMPLETE METABOLIC PANEL WITH GFR  Checking CBC and CMP for medication monitoring on continued use of methotrexate.  Had an interval cellulitis at left leg stump no other serious infections.  Locking finger joint  Symptoms described suggestive for trigger finger but there is no reproducible popping or locking on exam and no palpable tendon swelling or nodules.  Symptoms not currently constant or severely limiting activities if getting worse consider follow-up including musculoskeletal ultrasound exam.  Orders: Orders Placed This Encounter  Procedures   Sedimentation rate   CBC with Differential/Platelet   COMPLETE METABOLIC PANEL WITH GFR   Meds ordered this encounter  Medications   methotrexate (RHEUMATREX) 2.5 MG tablet    Sig: Take 6 tablets (15 mg total) by mouth once a week. Caution: Chemotherapy. Protect from light.    Dispense:  78 tablet    Refill:  0   folic acid (FOLVITE) 1 MG tablet    Sig: Take 1 tablet (1 mg total) by mouth daily.    Dispense:  90 tablet    Refill:  3     Follow-Up Instructions: Return in about 3 months (around 12/19/2022) for RA on MTX f/u 3mos.   Fuller Plan, MD  Note - This record has been created using [employer].  Chart creation errors have been sought, but may not always  have been located. Such creation errors do not reflect on  the standard of medical care.

## 2022-09-15 ENCOUNTER — Other Ambulatory Visit (HOSPITAL_BASED_OUTPATIENT_CLINIC_OR_DEPARTMENT_OTHER): Payer: Self-pay

## 2022-09-18 ENCOUNTER — Other Ambulatory Visit (HOSPITAL_BASED_OUTPATIENT_CLINIC_OR_DEPARTMENT_OTHER): Payer: Self-pay

## 2022-09-18 ENCOUNTER — Encounter: Payer: Self-pay | Admitting: Internal Medicine

## 2022-09-18 ENCOUNTER — Ambulatory Visit: Payer: Medicare Other | Attending: Internal Medicine | Admitting: Internal Medicine

## 2022-09-18 VITALS — BP 116/68 | HR 50 | Resp 14 | Ht 69.0 in | Wt 225.0 lb

## 2022-09-18 DIAGNOSIS — M059 Rheumatoid arthritis with rheumatoid factor, unspecified: Secondary | ICD-10-CM | POA: Insufficient documentation

## 2022-09-18 DIAGNOSIS — M24849 Other specific joint derangements of unspecified hand, not elsewhere classified: Secondary | ICD-10-CM | POA: Diagnosis present

## 2022-09-18 DIAGNOSIS — Z79899 Other long term (current) drug therapy: Secondary | ICD-10-CM | POA: Insufficient documentation

## 2022-09-18 HISTORY — DX: Other specific joint derangements of unspecified hand, not elsewhere classified: M24.849

## 2022-09-18 MED ORDER — METHOTREXATE SODIUM 2.5 MG PO TABS
15.0000 mg | ORAL_TABLET | ORAL | 0 refills | Status: DC
Start: 2022-09-18 — End: 2022-12-19
  Filled 2022-09-18: qty 78, 91d supply, fill #0

## 2022-09-18 MED ORDER — FOLIC ACID 1 MG PO TABS
1.0000 mg | ORAL_TABLET | Freq: Every day | ORAL | 3 refills | Status: DC
Start: 2022-09-18 — End: 2023-04-11
  Filled 2022-09-18 – 2022-10-20 (×2): qty 90, 90d supply, fill #0
  Filled 2023-01-22: qty 90, 90d supply, fill #1

## 2022-09-18 NOTE — Patient Instructions (Signed)
The thumb problems may suggest a trigger finger but I do not feel any specific swollen tendon or nodule currently.

## 2022-09-19 LAB — CBC WITH DIFFERENTIAL/PLATELET
Absolute Monocytes: 726 cells/uL (ref 200–950)
Basophils Absolute: 30 cells/uL (ref 0–200)
Basophils Relative: 0.5 %
Eosinophils Absolute: 130 cells/uL (ref 15–500)
Eosinophils Relative: 2.2 %
HCT: 34.2 % — ABNORMAL LOW (ref 35.0–45.0)
Hemoglobin: 11.1 g/dL — ABNORMAL LOW (ref 11.7–15.5)
Lymphs Abs: 1339 cells/uL (ref 850–3900)
MCH: 30.6 pg (ref 27.0–33.0)
MCHC: 32.5 g/dL (ref 32.0–36.0)
MCV: 94.2 fL (ref 80.0–100.0)
MPV: 10.3 fL (ref 7.5–12.5)
Monocytes Relative: 12.3 %
Neutro Abs: 3676 cells/uL (ref 1500–7800)
Neutrophils Relative %: 62.3 %
Platelets: 261 10*3/uL (ref 140–400)
RBC: 3.63 10*6/uL — ABNORMAL LOW (ref 3.80–5.10)
RDW: 14.8 % (ref 11.0–15.0)
Total Lymphocyte: 22.7 %
WBC: 5.9 10*3/uL (ref 3.8–10.8)

## 2022-09-19 LAB — COMPLETE METABOLIC PANEL WITH GFR
AG Ratio: 1.2 (calc) (ref 1.0–2.5)
ALT: 11 U/L (ref 6–29)
AST: 14 U/L (ref 10–35)
Albumin: 3.5 g/dL — ABNORMAL LOW (ref 3.6–5.1)
Alkaline phosphatase (APISO): 75 U/L (ref 37–153)
BUN/Creatinine Ratio: 13 (calc) (ref 6–22)
BUN: 14 mg/dL (ref 7–25)
CO2: 28 mmol/L (ref 20–32)
Calcium: 8.4 mg/dL — ABNORMAL LOW (ref 8.6–10.4)
Chloride: 106 mmol/L (ref 98–110)
Creat: 1.07 mg/dL — ABNORMAL HIGH (ref 0.50–1.05)
Globulin: 3 g/dL (calc) (ref 1.9–3.7)
Glucose, Bld: 85 mg/dL (ref 65–99)
Potassium: 4.2 mmol/L (ref 3.5–5.3)
Sodium: 141 mmol/L (ref 135–146)
Total Bilirubin: 0.5 mg/dL (ref 0.2–1.2)
Total Protein: 6.5 g/dL (ref 6.1–8.1)
eGFR: 57 mL/min/{1.73_m2} — ABNORMAL LOW (ref >=60)

## 2022-09-19 LAB — SEDIMENTATION RATE: Sed Rate: 50 mm/h — ABNORMAL HIGH (ref 0–30)

## 2022-09-20 ENCOUNTER — Other Ambulatory Visit: Payer: Medicare Other

## 2022-09-20 ENCOUNTER — Other Ambulatory Visit (HOSPITAL_BASED_OUTPATIENT_CLINIC_OR_DEPARTMENT_OTHER): Payer: Self-pay

## 2022-09-20 ENCOUNTER — Encounter: Payer: Self-pay | Admitting: Medical

## 2022-09-20 ENCOUNTER — Ambulatory Visit (INDEPENDENT_AMBULATORY_CARE_PROVIDER_SITE_OTHER): Payer: Medicare Other | Admitting: Medical

## 2022-09-20 ENCOUNTER — Ambulatory Visit: Payer: Medicare Other | Admitting: Medical

## 2022-09-20 VITALS — BP 120/65 | HR 50 | Ht 69.0 in

## 2022-09-20 DIAGNOSIS — F419 Anxiety disorder, unspecified: Secondary | ICD-10-CM | POA: Diagnosis not present

## 2022-09-20 DIAGNOSIS — I10 Essential (primary) hypertension: Secondary | ICD-10-CM | POA: Diagnosis not present

## 2022-09-20 DIAGNOSIS — Z86718 Personal history of other venous thrombosis and embolism: Secondary | ICD-10-CM | POA: Diagnosis not present

## 2022-09-20 DIAGNOSIS — R944 Abnormal results of kidney function studies: Secondary | ICD-10-CM

## 2022-09-20 NOTE — Progress Notes (Signed)
   Subjective:    Patient ID: Rita Watkins, female    DOB: 06/20/1955, 67 y.o.   MRN: [id number]  HPI Discussed the use of AI scribe software for clinical note transcription with the patient, who gave verbal consent to proceed.  History of Present Illness   The patient, with a history of rheumatoid arthritis, deep vein thrombosis (DVT), and hypertension, presents for a follow-up visit. She has been managing their anxiety and panic attacks with a prescribed medication xanax twice daily. The patient reports no adverse side effects from this medication and notes improved sleep as a secondary benefit.  The patient's hypertension is managed with amlodipine 2.5 mg and benazepril 20 mg daily.    She recently saw a rheumatologist for her rheumatoid arthritis and is on methotrexate and folic acid. Controlling her pain except thumbs stiff.   Their DVT is managed by a Coumadin clinic.        Review of Systems See hpi    Objective:   Physical Exam  General Mental Status- Alert. General Appearance- Not in acute distress.    Skin General: Color- Normal Color. Moisture- Normal Moisture.   Neck Carotid Arteries- Normal color. Moisture- Normal Moisture. No carotid bruits. No JVD.   Chest and Lung Exam Auscultation: Breath Sounds:-Normal.   Cardiovascular Auscultation:Rythm- Regular. Murmurs & Other Heart Sounds:Auscultation of the heart reveals- No Murmurs.   Abdomen Inspection:-Inspeection Normal. Palpation/Percussion:Note:No mass. Palpation and Percussion of the abdomen reveal- Non Tender, Non Distended + BS, no rebound or guarding.   Neurologic Cranial Nerve exam:- CN III-XII intact(No nystagmus), symmetric smile. Strength:- 5/5 equal and symmetric strength both upper and lower extremities.       Assessment & Plan:  Assessment and Plan    Anxiety with Panic Attacks Stable on current regimen. No adverse effects reported. Medication assists with sleep as a secondary  benefit. -Continue current medication regimen. -Perform oral drug screen and update contract.  Hypertension Stable on current regimen of Amlodipine 2.5mg  and Benazepril 20mg  daily. -Continue current medication regimen.  Rheumatoid Arthritis Recently seen by rheumatologist. On Methotrexate and Folic Acid. -Continue current medication regimen.  History of DVT Managed by Coumadin clinic. -Continue current management.  Elevated Creatinine Slightly elevated creatinine and decreased GFR noted on recent labs. Possible dehydration. -Ensure adequate hydration, especially 24 hours prior to lab draws. -Follow-up in three months.  Lab likely with rheumatologist.  Follow up with me in 6 months or sooner if needed      [employer], PA-C

## 2022-09-20 NOTE — Patient Instructions (Addendum)
Anxiety with Panic Attacks Stable on current regimen. No adverse effects reported. Medication assists with sleep as a secondary benefit. -Continue current medication regimen. -Perform oral drug screen and update contract.  Hypertension Stable on current regimen of Amlodipine 2.5mg  and Benazepril 20mg  daily. -Continue current medication regimen.  Rheumatoid Arthritis Recently seen by rheumatologist. On Methotrexate and Folic Acid. -Continue current medication regimen.  History of DVT Managed by Coumadin clinic. -Continue current management.  Elevated Creatinine Slightly elevated creatinine and decreased GFR noted on recent labs. Possible dehydration. -Ensure adequate hydration, especially 24 hours prior to lab draws. -Follow-up in three months.  Follow up in 6 months or sooner if needed

## 2022-09-21 ENCOUNTER — Encounter (INDEPENDENT_AMBULATORY_CARE_PROVIDER_SITE_OTHER): Payer: Self-pay

## 2022-09-22 ENCOUNTER — Other Ambulatory Visit (HOSPITAL_BASED_OUTPATIENT_CLINIC_OR_DEPARTMENT_OTHER): Payer: Self-pay

## 2022-09-22 ENCOUNTER — Other Ambulatory Visit: Payer: Self-pay

## 2022-10-03 ENCOUNTER — Ambulatory Visit: Payer: Medicare Other

## 2022-10-03 DIAGNOSIS — Z7901 Long term (current) use of anticoagulants: Secondary | ICD-10-CM | POA: Diagnosis not present

## 2022-10-03 LAB — POCT INR: INR: 2.3 (ref 2.0–3.0)

## 2022-10-03 NOTE — Patient Instructions (Addendum)
Pre visit review using our clinic review tool, if applicable. No additional management support is needed unless otherwise documented below in the visit note.  Continue 1/2 tablet daily except take 1 tablet on Mondays, Wednesdays and Fridays. Recheck in 4 weeks. Contact the coumadin clinic number at [phone], if any changes or any questions.

## 2022-10-03 NOTE — Progress Notes (Signed)
Continue 1/2 tablet daily except take 1 tablet on Mondays, Wednesdays and Fridays. Recheck in 4 weeks. Contact the coumadin clinic number at [phone], if any changes or any questions.

## 2022-10-17 ENCOUNTER — Ambulatory Visit (INDEPENDENT_AMBULATORY_CARE_PROVIDER_SITE_OTHER): Payer: Medicare Other

## 2022-10-17 ENCOUNTER — Ambulatory Visit (INDEPENDENT_AMBULATORY_CARE_PROVIDER_SITE_OTHER): Payer: Medicare Other | Admitting: *Deleted

## 2022-10-17 DIAGNOSIS — Z23 Encounter for immunization: Secondary | ICD-10-CM | POA: Diagnosis not present

## 2022-10-17 DIAGNOSIS — Z0000 Encounter for general adult medical examination without abnormal findings: Secondary | ICD-10-CM | POA: Diagnosis not present

## 2022-10-17 NOTE — Patient Instructions (Signed)
[name] , Thank you for taking time to come for your Medicare Wellness Visit. I appreciate your ongoing commitment to your health goals. Please review the following plan we discussed and let me know if I can assist you in the future.     This is a list of the screening recommended for you and due dates:  Health Maintenance  Topic Date Due   Zoster (Shingles) Vaccine (1 of 2) Never done   Screening for Lung Cancer  Never done   Mammogram  02/06/2010   Colon Cancer Screening  02/06/2013   DTaP/Tdap/Td vaccine (2 - Td or Tdap) 04/10/2018   DEXA scan (bone density measurement)  Never done   Flu Shot  09/07/2022   Medicare Annual Wellness Visit  10/17/2023   Pneumonia Vaccine (3 of 3 - PPSV23 or PCV20) 12/18/2023   Hepatitis C Screening  Completed   HPV Vaccine  Aged Out   COVID-19 Vaccine  Discontinued    Next appointment: Follow up in one year for your annual wellness visit.   Preventive Care 6 Years and Older, Female Preventive care refers to lifestyle choices and visits with your health care provider that can promote health and wellness. What does preventive care include? A yearly physical exam. This is also called an annual well check. Dental exams once or twice a year. Routine eye exams. Ask your health care provider how often you should have your eyes checked. Personal lifestyle choices, including: Daily care of your teeth and gums. Regular physical activity. Eating a healthy diet. Avoiding tobacco and drug use. Limiting alcohol use. Practicing safe sex. Taking low-dose aspirin every day. Taking vitamin and mineral supplements as recommended by your health care provider. What happens during an annual well check? The services and screenings done by your health care provider during your annual well check will depend on your age, overall health, lifestyle risk factors, and family history of disease. Counseling  Your health care provider may ask you questions about  your: Alcohol use. Tobacco use. Drug use. Emotional well-being. Home and relationship well-being. Sexual activity. Eating habits. History of falls. Memory and ability to understand (cognition). Work and work Astronomer. Reproductive health. Screening  You may have the following tests or measurements: Height, weight, and BMI. Blood pressure. Lipid and cholesterol levels. These may be checked every 5 years, or more frequently if you are over 57 years old. Skin check. Lung cancer screening. You may have this screening every year starting at age 36 if you have a 30-pack-year history of smoking and currently smoke or have quit within the past 15 years. Fecal occult blood test (FOBT) of the stool. You may have this test every year starting at age 20. Flexible sigmoidoscopy or colonoscopy. You may have a sigmoidoscopy every 5 years or a colonoscopy every 10 years starting at age 75. Hepatitis C blood test. Hepatitis B blood test. Sexually transmitted disease (STD) testing. Diabetes screening. This is done by checking your blood sugar (glucose) after you have not eaten for a while (fasting). You may have this done every 1-3 years. Bone density scan. This is done to screen for osteoporosis. You may have this done starting at age 81. Mammogram. This may be done every 1-2 years. Talk to your health care provider about how often you should have regular mammograms. Talk with your health care provider about your test results, treatment options, and if necessary, the need for more tests. Vaccines  Your health care provider may recommend certain vaccines, such  as: Influenza vaccine. This is recommended every year. Tetanus, diphtheria, and acellular pertussis (Tdap, Td) vaccine. You may need a Td booster every 10 years. Zoster vaccine. You may need this after age 51. Pneumococcal 13-valent conjugate (PCV13) vaccine. One dose is recommended after age 29. Pneumococcal polysaccharide (PPSV23) vaccine.  One dose is recommended after age 80. Talk to your health care provider about which screenings and vaccines you need and how often you need them. This information is not intended to replace advice given to you by your health care provider. Make sure you discuss any questions you have with your health care provider. Document Released: 02/19/2015 Document Revised: 10/13/2015 Document Reviewed: 11/24/2014 Elsevier Interactive Patient Education  2017 [employer].  Fall Prevention in the Home Falls can cause injuries. They can happen to people of all ages. There are many things you can do to make your home safe and to help prevent falls. What can I do on the outside of my home? Regularly fix the edges of walkways and driveways and fix any cracks. Remove anything that might make you trip as you walk through a door, such as a raised step or threshold. Trim any bushes or trees on the path to your home. Use bright outdoor lighting. Clear any walking paths of anything that might make someone trip, such as rocks or tools. Regularly check to see if handrails are loose or broken. Make sure that both sides of any steps have handrails. Any raised decks and porches should have guardrails on the edges. Have any leaves, snow, or ice cleared regularly. Use sand or salt on walking paths during winter. Clean up any spills in your garage right away. This includes oil or grease spills. What can I do in the bathroom? Use night lights. Install grab bars by the toilet and in the tub and shower. Do not use towel bars as grab bars. Use non-skid mats or decals in the tub or shower. If you need to sit down in the shower, use a plastic, non-slip stool. Keep the floor dry. Clean up any water that spills on the floor as soon as it happens. Remove soap buildup in the tub or shower regularly. Attach bath mats securely with double-sided non-slip rug tape. Do not have throw rugs and other things on the floor that can make  you trip. What can I do in the bedroom? Use night lights. Make sure that you have a light by your bed that is easy to reach. Do not use any sheets or blankets that are too big for your bed. They should not hang down onto the floor. Have a firm chair that has side arms. You can use this for support while you get dressed. Do not have throw rugs and other things on the floor that can make you trip. What can I do in the kitchen? Clean up any spills right away. Avoid walking on wet floors. Keep items that you use a lot in easy-to-reach places. If you need to reach something above you, use a strong step stool that has a grab bar. Keep electrical cords out of the way. Do not use floor polish or wax that makes floors slippery. If you must use wax, use non-skid floor wax. Do not have throw rugs and other things on the floor that can make you trip. What can I do with my stairs? Do not leave any items on the stairs. Make sure that there are handrails on both sides of the stairs and use  them. Fix handrails that are broken or loose. Make sure that handrails are as long as the stairways. Check any carpeting to make sure that it is firmly attached to the stairs. Fix any carpet that is loose or worn. Avoid having throw rugs at the top or bottom of the stairs. If you do have throw rugs, attach them to the floor with carpet tape. Make sure that you have a light switch at the top of the stairs and the bottom of the stairs. If you do not have them, ask someone to add them for you. What else can I do to help prevent falls? Wear shoes that: Do not have high heels. Have rubber bottoms. Are comfortable and fit you well. Are closed at the toe. Do not wear sandals. If you use a stepladder: Make sure that it is fully opened. Do not climb a closed stepladder. Make sure that both sides of the stepladder are locked into place. Ask someone to hold it for you, if possible. Clearly mark and make sure that you can  see: Any grab bars or handrails. First and last steps. Where the edge of each step is. Use tools that help you move around (mobility aids) if they are needed. These include: Canes. Walkers. Scooters. Crutches. Turn on the lights when you go into a dark area. Replace any light bulbs as soon as they burn out. Set up your furniture so you have a clear path. Avoid moving your furniture around. If any of your floors are uneven, fix them. If there are any pets around you, be aware of where they are. Review your medicines with your doctor. Some medicines can make you feel dizzy. This can increase your chance of falling. Ask your doctor what other things that you can do to help prevent falls. This information is not intended to replace advice given to you by your health care provider. Make sure you discuss any questions you have with your health care provider. Document Released: 11/19/2008 Document Revised: 07/01/2015 Document Reviewed: 02/27/2014 Elsevier Interactive Patient Education  2017 [employer].

## 2022-10-17 NOTE — Progress Notes (Signed)
Subjective:   Rita Watkins is a 67 y.o. female who presents for Medicare Annual (Subsequent) preventive examination.  Visit Complete: Virtual  I connected with  Rita Watkins on 10/17/22 by a audio enabled telemedicine application and verified that I am speaking with the correct person using two identifiers.  Patient Location: Home  Provider Location: Office/Clinic  I discussed the limitations of evaluation and management by telemedicine. The patient expressed understanding and agreed to proceed.  Patient Medicare AWV questionnaire was completed by the patient on 10/10/22; I have confirmed that all information answered by patient is correct and no changes since this date.  Review of Systems     Cardiac Risk Factors include: advanced age (>15men, >26 women);dyslipidemia;hypertension;obesity (BMI >30kg/m2)     Objective:    Vital Signs: Unable to obtain new vitals due to this being a telehealth visit.      10/17/2022   10:26 AM 10/12/2021    1:12 PM 10/06/2020   11:42 AM 05/18/2020    3:37 PM 05/23/2019    2:08 PM 08/14/2016   12:28 PM 06/26/2016    7:10 PM  Advanced Directives  Does Patient Have a Medical Advance Directive? Yes Yes Yes Yes No No Yes  Type of Estate agent of [place];Living will;Out of facility DNR (pink MOST or yellow form) Out of facility DNR (pink MOST or yellow form);Healthcare Power of [place];Living will Healthcare Power of [place];Living will Living will   Healthcare Power of [place];Living will  Does patient want to make changes to medical advance directive? No - Patient declined      No - Patient declined  Copy of Healthcare Power of Attorney in Chart? No - copy requested No - copy requested No - copy requested No - copy requested   No - copy requested  Would patient like information on creating a medical advance directive?      No - Patient declined     Current Medications (verified) Outpatient Encounter Medications as of 10/17/2022   Medication Sig   albuterol (VENTOLIN HFA) 108 (90 Base) MCG/ACT inhaler Inhale 2 puffs into the lungs every 6 (six) hours as needed.   ALPRAZolam (XANAX) 0.5 MG tablet Take 1 tablet (0.5 mg total) by mouth 2 (two) times daily as needed for anxiety.   amLODipine (NORVASC) 2.5 MG tablet Take 1 tablet (2.5 mg total) by mouth daily.   benazepril (LOTENSIN) 20 MG tablet Take 1 tablet (20 mg total) by mouth 2 (two) times daily.   folic acid (FOLVITE) 1 MG tablet Take 1 tablet (1 mg total) by mouth daily.   gabapentin (NEURONTIN) 300 MG capsule Take 1 capsule (300 mg total) by mouth at bedtime.   methotrexate (RHEUMATREX) 2.5 MG tablet Take 6 tablets (15 mg total) by mouth once a week. Caution: Chemotherapy. Protect from light.   warfarin (COUMADIN) 5 MG tablet TAKE 1/2 TABLET BY MOUTH DAILY EXCEPT TAKE 1 TABLET ON MONDAY, WEDNESDAY AND FRIDAY OR AS DIRECTED BY ANTICOAGULATION CLINIC   [DISCONTINUED] cephALEXin (KEFLEX) 500 MG capsule Take 1 capsule (500 mg total) by mouth 2 (two) times daily.   [DISCONTINUED] DULoxetine (CYMBALTA) 30 MG capsule Take 1 capsule (30 mg total) by mouth daily.   [DISCONTINUED] fluticasone (FLONASE) 50 MCG/ACT nasal spray Place 2 sprays into both nostrils daily. (Patient not taking: Reported on 09/18/2022)   [DISCONTINUED] mupirocin cream (BACTROBAN) 2 % Apply 1 Application topically 2 (two) times daily. (Patient not taking: Reported on 09/18/2022)   [DISCONTINUED] nystatin cream (  MYCOSTATIN) Apply 1 Application topically 2 (two) times daily. (Patient not taking: Reported on 09/18/2022)   [DISCONTINUED] sodium polystyrene (KAYEXALATE) 15 GM/60ML suspension Take 60 mLs (15 grams total) by mouth daily for 4 days. (Patient not taking: Reported on 09/18/2022)   No facility-administered encounter medications on file as of 10/17/2022.    Allergies (verified) Avelox [moxifloxacin hcl in nacl], Montelukast sodium, and Pravastatin   History: Past Medical History:  Diagnosis Date    Allergy    Anemia associated with acute blood loss 08/22/2016   Formatting of this note is different from the original.  CT abdomen 7/17:   1.  Large left retroperitoneal hematoma with mixed density blood products.  Assessment for active bleeding is not possible without IV contrast. 2.  Contrast in the renal collecting systems presumably from contrast enhanced studies performed on August 20, 2016 consistent with known impaired renal function.   CT abdomen 7/27:    Anxiety    Benign essential HTN 08/14/2016   Cardiac murmur 04/20/2020   Cigarette smoker 07/15/2014   COPD (chronic obstructive pulmonary disease) (HCC)    COPD (chronic obstructive pulmonary disease) (HCC)    Deep vein thrombosis (DVT) of popliteal vein of both lower extremities (HCC) 08/18/2016   Last Assessment & Plan:  Formatting of this note might be different from the original. Not clear if provoked/unprovoked (pt had trauma to left toe back in 03/2016 with some decreased mobility thereafter) CT chest confirmed acute PE and also concerning for some bilateral upper lobe groundglass opacities Hematology on board and heparin drip started but  discontinued 7/17  2/2 large retroperitoneal he   Depression    Depression with anxiety 09/13/2012   Dizziness 07/19/2016   Essential hypertension 04/20/2020   History of pulmonary embolism 04/20/2020   Hyperlipidemia    Hypertension    Leukocytosis 07/30/2014   Lymphangitis 06/07/2016   Mycobacterial infection 06/07/2016   Mycobacterial infection 06/07/2016   Mycobacterium infection, 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 Per dermatology: Please provide the patient with our outpatient clinic phone number [phone]) to call and schedule a follow up appointment for continued management.   Last Assessment & Plan:  Formatting of this note might be  different from the original. Uncertain etiology Vasculitic vs infectious vs malignancy Extensive work up has   Obesity 09/20/2016   Last Assessment & Plan:  Formatting of this note might be different from the original. Body mass index is 22.67 kg/m.  - For weight loss counseling.   Obesity (BMI 30.0-34.9) 04/20/2020   Open leg wound, left, sequela 08/17/2016   Formatting of this note might be different from the original. - Chronic left wound starting in April 2018 and worsening despite antibiotics and debridement -Patient had multiple hospitalizations and was treated with steroids and antibiotics with worsening/extension of the wound - ESR 103, CRP 168 - ANA, factor V Leyden, Antithrombin III, Cardiolite and antibody IgG, C3 and C4 complement proteinase   Peripheral vascular disease (HCC) 08/19/2016   Last Assessment & Plan:  Formatting of this note might be different from the original. 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  ASA 81 mg daily  PT consult   Pulmonary embolism without acute cor pulmonale (HCC) 08/18/2016   Last Assessment & Plan:  Formatting of this note might be different from the original. 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 Added automatically from request for surgery [id number]  Last Assessment & Plan:  Formatting of this note might be different from the original. Presented with large  area of necrotic tissue LLE Bilateral popliteal DVT Etiology uncertain:  Ischemic vs vasculitis vs autoimmune vs possible occult malignancy Bx: leukocytoblastic vasculopathy  In   Tobacco use 08/18/2016   Last Assessment & Plan:  Formatting of this note might be different from the original. Patient has a 30+ pack year history of smoking Discussed low dose CT chest for cancer screening at admission, patient open to this but concerned about extra cost as she has no insurance at this time. No obvious suspicious lesions noted on CTA PE study.  Counseled regarding tobacco cessation   Past Surgical History:  Procedure Laterality Date   FOOT SURGERY     Left   HIP PINNING     3 each hip   IRRIGATION AND DEBRIDEMENT ABSCESS N/A 06/27/2016   Procedure: IRRIGATION AND DEBRIDEMENT LEFT LEG WITH SOTFT TISSUE BIOPSY;  Surgeon: Berna Bue, MD;  Location: [facility];  Service: General;  Laterality: N/A;   LEG AMPUTATION Left    LYMPH NODE BIOPSY Left 06/09/2016   Procedure: BIOPSY LEFT LEG;  Surgeon: Peggye Form, DO;  Location: [facility];  Service: Plastics;  Laterality: Left;   SLIPPED CAPITAL FEMORAL EPIPHYSIS PINNING     Family History  Problem Relation Age of Onset   Hypertension Mother    Hyperlipidemia Mother    Osteoarthritis Mother    Rheum arthritis Mother    Arthritis Mother    Hypertension Brother    Asthma Brother    Osteoarthritis Brother    Obesity Brother    Social History   Socioeconomic History   Marital status: Widowed    Spouse name: Not on file   Number of children: Not on file   Years of education: high schoo   Highest education level: 12th grade  Occupational History   Occupation: ADM. ASSISTANT    Employer: [employer]  Tobacco Use   Smoking status: Former    Current packs/day: 0.00    Average packs/day: 1 pack/day for 35.0 years (35.0 ttl pk-yrs)    Types: Cigarettes    Start date: 09/14/1981    Quit date: 09/14/2016    Years since quitting: 6.0    Passive  exposure: Past   Smokeless tobacco: Never  Vaping Use   Vaping status: Never Used  Substance and Sexual Activity   Alcohol use: No    Alcohol/week: 0.0 standard drinks of alcohol   Drug use: No   Sexual activity: Not Currently    Birth control/protection: Post-menopausal  Other Topics Concern   Not on file  Social History Narrative   Does not exercise.   Social Determinants of Health   Financial Resource Strain: Low Risk  (10/10/2022)   Overall Financial Resource Strain (CARDIA)    Difficulty of Paying Living  Expenses: Not very hard  Food Insecurity: No Food Insecurity (10/10/2022)   Hunger Vital Sign    Worried About Running Out of Food in the Last Year: Never true    Ran Out of Food in the Last Year: Never true  Transportation Needs: No Transportation Needs (10/10/2022)   PRAPARE - Administrator, Civil Service (Medical): No    Lack of Transportation (Non-Medical): No  Physical Activity: Inactive (10/10/2022)   Exercise Vital Sign    Days of Exercise per Week: 0 days    Minutes of Exercise per Session: 10 min  Stress: No Stress Concern Present (10/10/2022)   [employer] of Occupational Health - Occupational Stress Questionnaire    Feeling of Stress : Only a little  Social Connections: Moderately Integrated (10/10/2022)   Social Connection and Isolation Panel [NHANES]    Frequency of Communication with Friends and Family: More than three times a week    Frequency of Social Gatherings with Friends and Family: Twice a week    Attends Religious Services: More than 4 times per year    Active Member of [employer] or Organizations: Yes    Attends Banker Meetings: More than 4 times per year    Marital Status: Widowed    Tobacco Counseling Counseling given: Not Answered   Clinical Intake:  Pre-visit preparation completed: Yes  Pain : No/denies pain  Nutritional Risks: None Diabetes: No  How often do you need to have someone help you when you read  instructions, pamphlets, or other written materials from your doctor or pharmacy?: 1 - Never  Interpreter Needed?: No  Information entered by :: Donne Anon, CMA   Activities of Daily Living    10/10/2022    9:17 AM  In your present state of health, do you have any difficulty performing the following activities:  Hearing? 0  Vision? 0  Difficulty concentrating or making decisions? 0  Walking or climbing stairs? 1  Dressing or bathing? 0  Doing errands, shopping? 0  Preparing Food and eating ? N  Using the Toilet? N  In the past six months, have you accidently leaked urine? Y  Do you have problems with loss of bowel control? N  Managing your Medications? N  Managing your Finances? N  Housekeeping or managing your Housekeeping? N    Patient Care Team: Saguier, Kateri Mc as PCP - General (Internal Medicine)  Indicate any recent Medical Services you may have received from other than [facility] providers in the past year (date may be approximate).     Assessment:   This is a routine wellness examination for Rita Watkins.  Hearing/Vision screen No results found.   Goals Addressed   None    Depression Screen    10/17/2022   10:35 AM 10/12/2021    1:17 PM 10/06/2020   11:46 AM 07/21/2014   10:21 AM 06/05/2014    2:11 PM 09/12/2013    1:14 PM  PHQ 2/9 Scores  PHQ - 2 Score 0 1 0 0 0 0  Exception Documentation    Patient refusal      Fall Risk    10/10/2022    9:17 AM 04/25/2022    1:45 PM 10/12/2021    1:13 PM 10/06/2020   11:43 AM 07/21/2014   10:21 AM  Fall Risk   Falls in the past year? 0 0 Exclusion - non ambulatory 1 No  Number falls in past yr: 0 0 0 0   Injury with Fall? 0  0 0 0   Risk for fall due to : Impaired balance/gait Impaired balance/gait;Impaired mobility  History of fall(s)   Follow up Falls evaluation completed Falls evaluation completed;Education provided Falls evaluation completed Falls prevention discussed     MEDICARE RISK AT HOME: Medicare Risk at  Home Any stairs in or around the home?: No If so, are there any without handrails?: No Home free of loose throw rugs in walkways, pet beds, electrical cords, etc?: Yes Adequate lighting in your home to reduce risk of falls?: Yes Life alert?: No Use of a cane, walker or w/c?: Yes Grab bars in the bathroom?: Yes Shower chair or bench in shower?: Yes Elevated toilet seat or a handicapped toilet?: Yes  TIMED UP AND GO:  Was the test performed?  No    Cognitive Function:        10/17/2022   10:27 AM 10/12/2021    1:29 PM  6CIT Screen  What Year? 0 points 0 points  What month? 0 points 0 points  What time? 0 points 0 points  Count back from 20 0 points 0 points  Months in reverse 0 points 0 points  Repeat phrase 0 points 0 points  Total Score 0 points 0 points    Immunizations Immunization History  Administered Date(s) Administered   COVID-19, mRNA, vaccine(Comirnaty)12 years and older 12/07/2021   Fluad Quad(high Dose 65+) 12/18/2018, 11/01/2020, 11/03/2021   Influenza Split 11/20/2013   Influenza Whole 11/10/2012   Influenza, High Dose Seasonal PF 11/29/2015   Influenza,inj,Quad PF,6+ Mos 10/29/2014, 11/15/2017   PFIZER Comirnaty(Gray Top)Covid-19 Tri-Sucrose Vaccine 05/19/2020   PFIZER(Purple Top)SARS-COV-2 Vaccination 04/20/2019, 05/10/2019   Pfizer Covid-19 Vaccine Bivalent Booster 89yrs & up 11/01/2020   Pneumococcal Conjugate-13 12/18/2018   Pneumococcal Polysaccharide-23 09/14/2011   Tdap 04/09/2008    TDAP status: Due, Education has been provided regarding the importance of this vaccine. Advised may receive this vaccine at local pharmacy or Health Dept. Aware to provide a copy of the vaccination record if obtained from local pharmacy or Health Dept. Verbalized acceptance and understanding.  Flu Vaccine status: Due, Education has been provided regarding the importance of this vaccine. Advised may receive this vaccine at local pharmacy or Health Dept. Aware to provide  a copy of the vaccination record if obtained from local pharmacy or Health Dept. Verbalized acceptance and understanding.  Pneumococcal vaccine status: Due, Education has been provided regarding the importance of this vaccine. Advised may receive this vaccine at local pharmacy or Health Dept. Aware to provide a copy of the vaccination record if obtained from local pharmacy or Health Dept. Verbalized acceptance and understanding.  Covid-19 vaccine status: Information provided on how to obtain vaccines.   Qualifies for Shingles Vaccine? Yes   Zostavax completed No   Shingrix Completed?: No.    Education has been provided regarding the importance of this vaccine. Patient has been advised to call insurance company to determine out of pocket expense if they have not yet received this vaccine. Advised may also receive vaccine at local pharmacy or Health Dept. Verbalized acceptance and understanding.  Screening Tests Health Maintenance  Topic Date Due   Zoster Vaccines- Shingrix (1 of 2) Never done   Lung Cancer Screening  Never done   MAMMOGRAM  02/06/2010   Colonoscopy  02/06/2013   DTaP/Tdap/Td (2 - Td or Tdap) 04/10/2018   DEXA SCAN  Never done   INFLUENZA VACCINE  09/07/2022   Medicare Annual Wellness (AWV)  10/13/2022   Pneumonia Vaccine 65+ Years  old (3 of 3 - PPSV23 or PCV20) 12/18/2023   Hepatitis C Screening  Completed   HPV VACCINES  Aged Out   COVID-19 Vaccine  Discontinued    Health Maintenance  Health Maintenance Due  Topic Date Due   Zoster Vaccines- Shingrix (1 of 2) Never done   Lung Cancer Screening  Never done   MAMMOGRAM  02/06/2010   Colonoscopy  02/06/2013   DTaP/Tdap/Td (2 - Td or Tdap) 04/10/2018   DEXA SCAN  Never done   INFLUENZA VACCINE  09/07/2022   Medicare Annual Wellness (AWV)  10/13/2022    Colorectal cancer screening: No longer required. Pt preference  Mammogram status: Pt declined  Bone Density status: Pt declined  Lung Cancer Screening: (Low  Dose CT Chest recommended if Age 33-80 years, 20 pack-year currently smoking OR have quit w/in 15years.) does qualify.   Lung Cancer Screening Referral: pt declined  Additional Screening:  Hepatitis C Screening: does qualify; Completed 06/08/16  Vision Screening: Recommended annual ophthalmology exams for early detection of glaucoma and other disorders of the eye. Is the patient up to date with their annual eye exam?  Yes  Who is the provider or what is the name of the office in which the patient attends annual eye exams? [facility] If pt is not established with a provider, would they like to be referred to a provider to establish care? No .   Dental Screening: Recommended annual dental exams for proper oral hygiene  Diabetic Foot Exam: N/a  Community Resource Referral / Chronic Care Management: CRR required this visit?  No   CCM required this visit?  No     Plan:     I have personally reviewed and noted the following in the patient's chart:   Medical and social history Use of alcohol, tobacco or illicit drugs  Current medications and supplements including opioid prescriptions. Patient is not currently taking opioid prescriptions. Functional ability and status Nutritional status Physical activity Advanced directives List of other physicians Hospitalizations, surgeries, and ER visits in previous 12 months Vitals Screenings to include cognitive, depression, and falls Referrals and appointments  In addition, I have reviewed and discussed with patient certain preventive protocols, quality metrics, and best practice recommendations. A written personalized care plan for preventive services as well as general preventive health recommendations were provided to patient.     Donne Anon, CMA   10/17/2022   After Visit Summary: (MyChart) Due to this being a telephonic visit, the after visit summary with patients personalized plan was offered to patient via MyChart   Nurse  Notes: None

## 2022-10-20 ENCOUNTER — Other Ambulatory Visit: Payer: Self-pay | Admitting: Medical

## 2022-10-20 ENCOUNTER — Other Ambulatory Visit (HOSPITAL_BASED_OUTPATIENT_CLINIC_OR_DEPARTMENT_OTHER): Payer: Self-pay

## 2022-10-20 ENCOUNTER — Other Ambulatory Visit: Payer: Self-pay

## 2022-10-20 NOTE — Telephone Encounter (Signed)
Requesting: xanax Contract: 09/27/22 UDS:09/27/22 Last Visit:09/20/22 Next Visit:n/a Last Refill:08/22/22  Please Advise

## 2022-10-21 MED ORDER — ALPRAZOLAM 0.5 MG PO TABS
0.5000 mg | ORAL_TABLET | Freq: Two times a day (BID) | ORAL | 3 refills | Status: DC | PRN
Start: 2022-10-21 — End: 2023-02-27
  Filled 2022-10-21: qty 60, 30d supply, fill #0
  Filled 2022-11-21: qty 60, 30d supply, fill #1
  Filled 2022-12-19 – 2022-12-20 (×2): qty 60, 30d supply, fill #2
  Filled 2023-01-22: qty 60, 30d supply, fill #3

## 2022-10-21 NOTE — Telephone Encounter (Signed)
Rx refill sent to pt pharmacy

## 2022-10-22 ENCOUNTER — Other Ambulatory Visit (HOSPITAL_BASED_OUTPATIENT_CLINIC_OR_DEPARTMENT_OTHER): Payer: Self-pay

## 2022-10-23 ENCOUNTER — Other Ambulatory Visit (HOSPITAL_BASED_OUTPATIENT_CLINIC_OR_DEPARTMENT_OTHER): Payer: Self-pay

## 2022-10-31 ENCOUNTER — Ambulatory Visit: Payer: Medicare Other

## 2022-11-07 ENCOUNTER — Ambulatory Visit (INDEPENDENT_AMBULATORY_CARE_PROVIDER_SITE_OTHER): Payer: Medicare Other

## 2022-11-07 DIAGNOSIS — Z7901 Long term (current) use of anticoagulants: Secondary | ICD-10-CM

## 2022-11-07 LAB — POCT INR: INR: 4.4 — AB (ref 2.0–3.0)

## 2022-11-07 NOTE — Progress Notes (Signed)
Pt has diarrhea for several days last week and used Kaopectate, which interacts with warfarin. Pt educated on what to use for diarrhea. Due to these two factors and pt being stable on current dose there will not be a change in weekly dose.   Pt already took dose today. Hold dose tomorrow and hold dose the day after tomorrow and then continue 1/2 tablet daily except take 1 tablet on Mondays, Wednesdays and Fridays. Recheck in 2 weeks. Contact the coumadin clinic number at [phone], if any changes or any questions.

## 2022-11-07 NOTE — Patient Instructions (Addendum)
Pre visit review using our clinic review tool, if applicable. No additional management support is needed unless otherwise documented below in the visit note.  Hold dose tomorrow and hold dose the day after tomorrow and then continue 1/2 tablet daily except take 1 tablet on Mondays, Wednesdays and Fridays. Recheck in 2 weeks. Contact the coumadin clinic number at [phone], if any changes or any questions.

## 2022-11-21 ENCOUNTER — Other Ambulatory Visit (HOSPITAL_BASED_OUTPATIENT_CLINIC_OR_DEPARTMENT_OTHER): Payer: Self-pay

## 2022-11-21 ENCOUNTER — Ambulatory Visit (INDEPENDENT_AMBULATORY_CARE_PROVIDER_SITE_OTHER): Payer: Medicare Other

## 2022-11-21 ENCOUNTER — Other Ambulatory Visit: Payer: Self-pay | Admitting: Medical

## 2022-11-21 ENCOUNTER — Other Ambulatory Visit: Payer: Self-pay

## 2022-11-21 DIAGNOSIS — Z7901 Long term (current) use of anticoagulants: Secondary | ICD-10-CM

## 2022-11-21 LAB — POCT INR: INR: 2.3 (ref 2.0–3.0)

## 2022-11-21 MED ORDER — WARFARIN SODIUM 5 MG PO TABS
ORAL_TABLET | ORAL | 1 refills | Status: DC
  Filled 2022-11-21: qty 75, 30d supply, fill #0
  Filled 2023-01-22: qty 75, 30d supply, fill #1

## 2022-11-21 MED ORDER — AMLODIPINE BESYLATE 2.5 MG PO TABS
2.5000 mg | ORAL_TABLET | Freq: Every day | ORAL | 1 refills | Status: DC
  Filled 2022-11-21: qty 90, 90d supply, fill #0

## 2022-11-21 NOTE — Progress Notes (Signed)
Continue 1/2 tablet daily except take 1 tablet on Mondays, Wednesdays and Fridays. Recheck in 3 weeks. Contact the coumadin clinic number at [phone], if any changes or any questions.   Pt is compliant with warfarin management and PCP apts.  Sent in refill of warfarin to requested pharmacy.

## 2022-11-21 NOTE — Patient Instructions (Addendum)
Pre visit review using our clinic review tool, if applicable. No additional management support is needed unless otherwise documented below in the visit note.  Continue 1/2 tablet daily except take 1 tablet on Mondays, Wednesdays and Fridays. Recheck in 3 weeks. Contact the coumadin clinic number at [phone], if any changes or any questions.

## 2022-11-22 ENCOUNTER — Other Ambulatory Visit (HOSPITAL_BASED_OUTPATIENT_CLINIC_OR_DEPARTMENT_OTHER): Payer: Self-pay

## 2022-12-05 NOTE — Progress Notes (Signed)
Office Visit Note  Patient: Rita Watkins             Date of Birth: [birth year]           MRN: [id number]             PCP: Marisue Brooklyn Referring: Marisue Brooklyn Visit Date: 12/19/2022   Subjective:  Follow-up (Patient feels great and states medication is working! )   History of Present Illness: Rita Watkins is a 67 y.o. 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 weekly folic acid 1 mg daily.  Overall feels arthritis symptoms are doing well.  No significant daily joint pain and stiffness and no major flareups.  She does notice cramping in her hands occurs pretty regularly often while working for prolonged time on the computer.  She often believes this with stretching or massaging her forearm muscles when it happens.  Not associated with any numbness loss of grip strength or unintentionally dropping items.  Previous HPI 09/18/2022 Rita Watkins is a 67 y.o. 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 weekly folic acid 1 mg daily.  Since her last visit she had an infection at left leg stump with a small ulcer or dehiscence lesion.  This cleared up and she completed antibiotics course. Did not contact our office or hold medications at the time.  New problem since her last visit issue with cramping or getting stuck in flexed position affecting the thumbs on both hands and other MCP joints on her right hand.  This comes and goes without warning sometimes at rest sometimes with activity such as hand grafts or while driving.  Releases after a few minutes.  Otherwise not having any flareup or increase of joint pain and swelling.   Previous HPI 06/14/2022 Rita Watkins is a 67 y.o. 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 weekly folic acid 1 mg daily.  Overall symptoms are doing well she has not had any particular exacerbation and does not experience any daily joint pain or swelling in her hands.   Still has issues with left shoulder pain typically more use related such as doing repetitive lifting working in her garden.  Rates it most days as around 3 out of 10 in severity but can get up to 10 out of 10 occasionally.   03/16/22 Rita Watkins is a 67 y.o. 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 weekly and folic acid 1 mg daily.  Joint pain and swelling in her hands are doing well and left shoulder pain remains mostly improved.  She is noticing some increased pain and stiffness at the right shoulder which is a new problem.  It is not hurting at rest just with use and no radiation.  Still has some redness and irritation overlying the swelling at her right ankle with itching.   12/08/21 Rita Watkins is a 67 y.o. female here for follow up for evaluation of inflammatory arthritis concern for RA with repeat joint swelling, pain, and increased sedimentation rate. Since starting the methotrexate 15 mg PO weekly she feels a good improvement in symptoms with almost no morning stiffness and no flare ups. Shoulder and hand pains are better. Still has about the same degree of leg swelling and redness on the lateral side. She does notice some headache after taking each weekly dose but it  lasts less than one day duration. She notices occasional shoulder pain provoked with lifting cases of water.   10/25/2021 Rita Watkins is a 67 y.o. female here for follow up for evaluation of inflammatory arthritis concern for RA with repeat joint swelling, pain, and increased sedimentation rate and improvement on prednisone. Xrays at last visit showed no erosive disease and mild severity of OA changes. Lab tests were all negative for markers of ANA-related disease process. CBC and CMP okay, does have mild renal impairment. Since our visit she has a flare up of left shoulder pain limiting mobility. Pain radiates into the arm when getting more severe. Her ankle remains the same amount swollen. No new  episode of hand swelling.   Previous HPI 10/06/2021 Rita Watkins is a 67 y.o. 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 She developed pain and swelling involving ankle and in right 2nd finger that was treated with prednisone taper with good response in PCP office. Lab testing positive for RF and elevated uric acid level. She has had joint pain in multiple areas but symptoms significantly increased since earlier this year.  In May she developed severe pain involving the left shoulder and right hand particularly her second finger.  She saw significant swelling in the hands and around the right ankle.  She was treated with a oral prednisone course lasting 6 days and the symptoms stayed improved for a little over 1 month.  She had a recurrence of joint swelling in July that also improved quickly with oral prednisone.  Due to the swelling and warmth around the ankle she is been treated with oral antibiotics that did not significantly affect her symptoms. 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  Was complicated by ultimately developing sepsis requiring hospital visit and prolonged nursing care treatment course.   Labs reviewed 06/2021 ANA 1:80 cytoplasmic reticular/AMA RF 40 Uric acid 8.3 ESR 33 CPR <1   Review of Systems  Constitutional: Negative.  Negative for fatigue.  HENT: Negative.  Negative for mouth sores and mouth dryness.   Eyes: Negative.  Negative for dryness.  Respiratory: Negative.  Negative for shortness of breath.   Cardiovascular: Negative.  Negative for chest pain and palpitations.  Gastrointestinal: Negative.  Negative for blood in stool, constipation and diarrhea.  Endocrine: Negative.  Negative for increased urination.  Genitourinary: Negative.  Negative for involuntary urination.   Musculoskeletal: Negative.  Negative for joint pain, gait problem, joint pain, joint swelling, myalgias, muscle weakness, morning stiffness, muscle tenderness and myalgias.  Skin: Negative.  Negative for color change, rash, hair loss and sensitivity to sunlight.  Allergic/Immunologic: Negative.  Negative for susceptible to infections.  Neurological: Negative.  Negative for dizziness and headaches.  Hematological: Negative.  Negative for swollen glands.  Psychiatric/Behavioral: Negative.  Negative for depressed mood and sleep disturbance. The patient is not nervous/anxious.     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   Peripheral vascular disease (HCC) 08/19/2016   Deep vein thrombosis (DVT) of popliteal vein of both lower extremities (HCC) 08/18/2016   Pulmonary embolism without acute cor pulmonale (HCC) 08/18/2016   Tobacco use 08/18/2016   Open leg wound, left, sequela 08/17/2016   Surgical wound, non healing 08/16/2016   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  CT abdomen 7/17:   1.  Large left retroperitoneal hematoma with mixed density blood products.  Assessment for active bleeding is not possible without IV contrast. 2.  Contrast in the renal collecting systems presumably from contrast enhanced studies performed on August 20, 2016 consistent with known impaired renal function.   CT abdomen 7/27:    Anxiety    Benign essential HTN 08/14/2016   Cardiac murmur 04/20/2020   Cigarette smoker 07/15/2014   COPD (chronic obstructive pulmonary disease) (HCC)    COPD (chronic obstructive pulmonary disease) (HCC)    Deep vein thrombosis (DVT) of popliteal vein of both lower extremities (HCC) 08/18/2016   Last Assessment & Plan:  Formatting of this note might be different from the original. Not clear if provoked/unprovoked (pt had trauma to left toe back in 03/2016 with some decreased mobility thereafter) CT chest confirmed acute PE and also concerning for some bilateral upper lobe groundglass opacities Hematology on board and heparin drip started but  discontinued 7/17  2/2 large retroperitoneal he   Depression    Depression with anxiety 09/13/2012   Dizziness 07/19/2016   Essential hypertension 04/20/2020   History of pulmonary embolism 04/20/2020   Hyperlipidemia    Hypertension    Leukocytosis 07/30/2014   Lymphangitis 06/07/2016   Mycobacterial infection 06/07/2016   Mycobacterial infection 06/07/2016   Mycobacterium infection, 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 Per dermatology: Please provide the patient with our outpatient clinic phone number [phone]) to call and schedule a follow up appointment for continued management.   Last Assessment & Plan:  Formatting of this note might be different from the  original. Uncertain etiology Vasculitic vs infectious vs malignancy Extensive work up has   Obesity 09/20/2016   Last Assessment & Plan:  Formatting of this note might be different from the original. Body mass index is 22.67 kg/m.  - For weight loss counseling.   Obesity (BMI 30.0-34.9) 04/20/2020   Open leg wound, left, sequela 08/17/2016   Formatting of this note might be different from the original. - Chronic left wound starting in April 2018 and worsening despite antibiotics and debridement -Patient had multiple hospitalizations and was treated with steroids and antibiotics with worsening/extension of the wound - ESR 103, CRP 168 - ANA, factor V Leyden, Antithrombin III, Cardiolite and antibody IgG, C3 and C4 complement proteinase   Peripheral vascular disease (HCC) 08/19/2016   Last  Assessment & Plan:  Formatting of this note might be different from the original. 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  ASA 81 mg daily PT consult   Pulmonary embolism without acute cor pulmonale (HCC) 08/18/2016   Last Assessment & Plan:  Formatting of this note might be different from the original. 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 Added automatically from request for surgery [id number]  Last Assessment & Plan:  Formatting of this note might be different from the original. Presented with large area of necrotic  tissue LLE Bilateral popliteal DVT Etiology uncertain:  Ischemic vs vasculitis vs autoimmune vs possible occult malignancy Bx: leukocytoblastic vasculopathy  In   Tobacco use 08/18/2016   Last Assessment & Plan:  Formatting of this note might be different from the original. Patient has a 30+ pack year history of smoking Discussed low dose CT chest for cancer screening at admission, patient open to this but concerned about extra cost as she has no insurance at this time. No obvious suspicious lesions noted on CTA PE study.  Counseled regarding tobacco cessation    Family History  Problem Relation Age of Onset   Hypertension Mother    Hyperlipidemia Mother    Osteoarthritis Mother    Rheum arthritis Mother    Arthritis Mother    Hypertension Brother    Asthma Brother    Osteoarthritis Brother    Obesity Brother    Past Surgical History:  Procedure Laterality Date   FOOT SURGERY     Left   HIP PINNING     3 each hip   IRRIGATION AND DEBRIDEMENT ABSCESS N/A 06/27/2016   Procedure: IRRIGATION AND DEBRIDEMENT LEFT LEG WITH SOTFT TISSUE BIOPSY;  Surgeon: Berna Bue, MD;  Location: [facility];  Service: General;  Laterality: N/A;   LEG AMPUTATION Left    LYMPH NODE BIOPSY Left 06/09/2016   Procedure: BIOPSY LEFT LEG;  Surgeon: Peggye Form, DO;  Location: [facility];  Service: Plastics;  Laterality: Left;   SLIPPED CAPITAL FEMORAL EPIPHYSIS PINNING     Social History   Social History Narrative   Does not exercise.   Immunization History  Administered Date(s) Administered   Fluad Quad(high Dose 65+) 12/18/2018, 11/01/2020, 11/03/2021   Fluad Trivalent(High Dose 65+) 10/17/2022   Influenza Split 11/20/2013   Influenza Whole 11/10/2012   Influenza, High Dose Seasonal PF 11/29/2015   Influenza,inj,Quad PF,6+ Mos 10/29/2014, 11/15/2017   PFIZER Comirnaty(Gray Top)Covid-19 Tri-Sucrose Vaccine 05/19/2020   PFIZER(Purple Top)SARS-COV-2 Vaccination 04/20/2019, 05/10/2019   Pfizer  Covid-19 Vaccine Bivalent Booster 45yrs & up 11/01/2020   Pfizer(Comirnaty)Fall Seasonal Vaccine 12 years and older 12/07/2021   Pneumococcal Conjugate-13 12/18/2018   Pneumococcal Polysaccharide-23 09/14/2011   Tdap 04/09/2008     Objective: Vital Signs: BP 135/77 (BP Location: Left Arm, Patient Position: Sitting, Cuff Size: Normal)   Pulse 78   Resp 16   Ht 5\' 9"  (  1.753 m)   Wt 225 lb (102.1 kg)   BMI 33.23 kg/m    Physical Exam Constitutional:      Appearance: She is obese.     Comments: In wheelchair  Eyes:     Conjunctiva/sclera: Conjunctivae normal.  Cardiovascular:     Rate and Rhythm: Normal rate and regular rhythm.  Pulmonary:     Effort: Pulmonary effort is normal.     Breath sounds: Normal breath sounds.  Lymphadenopathy:     Cervical: No cervical adenopathy.  Skin:    General: Skin is warm and dry.     Comments: Right lower leg pitting edema, skin hardening and anterior erythema extending few inches up from ankle  Neurological:     Mental Status: She is alert.  Psychiatric:        Mood and Affect: Mood normal.      Musculoskeletal Exam:  Shoulders full ROM no tenderness or swelling Elbows full ROM no tenderness or swelling Wrists full ROM no tenderness or swelling Fingers full ROM 1st CMC joint squaring, no tenderness or swelling Left leg AKA   Investigation: No additional findings.  Imaging: No results found.  Recent Labs: Lab Results  Component Value Date   WBC 5.9 09/18/2022   HGB 11.1 (L) 09/18/2022   PLT 261 09/18/2022   NA 141 09/18/2022   K 4.2 09/18/2022   CL 106 09/18/2022   CO2 28 09/18/2022   GLUCOSE 85 09/18/2022   BUN 14 09/18/2022   CREATININE 1.07 (H) 09/18/2022   BILITOT 0.5 09/18/2022   ALKPHOS 73 08/08/2022   AST 14 09/18/2022   ALT 11 09/18/2022   PROT 6.5 09/18/2022   ALBUMIN 3.5 08/08/2022   CALCIUM 8.4 (L) 09/18/2022   GFRAA 72 08/05/2019    Speciality Comments: No specialty comments  available.  Procedures:  No procedures performed Allergies: Avelox [moxifloxacin hcl in nacl], Montelukast sodium, and Pravastatin   Assessment / Plan:     Visit Diagnoses: Seropositive rheumatoid arthritis (HCC) - Plan: Sedimentation rate, C-reactive protein, methotrexate (RHEUMATREX) 2.5 MG tablet  Joint inflammation appears very well-controlled there is less finger tenderness or swelling compared to previous exam.  Sedimentation rate has been moderately elevated on most recent labs we will recheck today as well as CRP.  Plan to continue on methotrexate 15 mg p.o. weekly and folic acid 1 mg daily.  High risk medication use - methotrexate 15 mg p.o. weekly folic acid 1 mg daily. - Plan: CBC with Differential/Platelet, COMPLETE METABOLIC PANEL WITH GFR  Checking CBC and CMP for medication monitoring on continued long-term use of methotrexate.  Most recent metabolic panel has been stable consistent with CKD stage IIIa.  No serious interval infection.  Locking finger joint  Finger locking up and hand cramping nothing reproducible on exam today.  No red flags for carpal tunnel syndrome no appreciable nodule or contracture.  Provided printed hand range of motion exercises.  Orders: Orders Placed This Encounter  Procedures   Sedimentation rate   C-reactive protein   CBC with Differential/Platelet   COMPLETE METABOLIC PANEL WITH GFR   Meds ordered this encounter  Medications   methotrexate (RHEUMATREX) 2.5 MG tablet    Sig: Take 6 tablets (15 mg total) by mouth once a week. Caution: Chemotherapy. Protect from light.    Dispense:  78 tablet    Refill:  0     Follow-Up Instructions: Return in about 3 months (around 03/21/2023) for RA on MTX f/u 3mos.   Jamesetta Orleans  Jolon Degante, MD  Note - This record has been created using Animal nutritionist.  Chart creation errors have been sought, but may not always  have been located. Such creation errors do not reflect on  the standard of medical care.

## 2022-12-12 ENCOUNTER — Ambulatory Visit (INDEPENDENT_AMBULATORY_CARE_PROVIDER_SITE_OTHER): Payer: Medicare Other

## 2022-12-12 DIAGNOSIS — Z7901 Long term (current) use of anticoagulants: Secondary | ICD-10-CM

## 2022-12-12 LAB — POCT INR: INR: 2.7 (ref 2.0–3.0)

## 2022-12-12 NOTE — Patient Instructions (Addendum)
Pre visit review using our clinic review tool, if applicable. No additional management support is needed unless otherwise documented below in the visit note.  Continue 1/2 tablet daily except take 1 tablet on Mondays, Wednesdays and Fridays. Recheck in 4 weeks. Contact the coumadin clinic number at [phone], if any changes or any questions.

## 2022-12-12 NOTE — Progress Notes (Signed)
Continue 1/2 tablet daily except take 1 tablet on Mondays, Wednesdays and Fridays. Recheck in 4 weeks. Contact the coumadin clinic number at [phone], if any changes or any questions.

## 2022-12-19 ENCOUNTER — Ambulatory Visit: Payer: Medicare Other | Attending: Internal Medicine | Admitting: Internal Medicine

## 2022-12-19 ENCOUNTER — Other Ambulatory Visit: Payer: Self-pay

## 2022-12-19 ENCOUNTER — Other Ambulatory Visit (HOSPITAL_BASED_OUTPATIENT_CLINIC_OR_DEPARTMENT_OTHER): Payer: Self-pay

## 2022-12-19 ENCOUNTER — Encounter: Payer: Self-pay | Admitting: Internal Medicine

## 2022-12-19 VITALS — BP 135/77 | HR 78 | Resp 16 | Ht 69.0 in | Wt 225.0 lb

## 2022-12-19 DIAGNOSIS — M24849 Other specific joint derangements of unspecified hand, not elsewhere classified: Secondary | ICD-10-CM | POA: Diagnosis present

## 2022-12-19 DIAGNOSIS — M059 Rheumatoid arthritis with rheumatoid factor, unspecified: Secondary | ICD-10-CM

## 2022-12-19 DIAGNOSIS — Z79899 Other long term (current) drug therapy: Secondary | ICD-10-CM | POA: Diagnosis present

## 2022-12-19 MED ORDER — METHOTREXATE SODIUM 2.5 MG PO TABS
15.0000 mg | ORAL_TABLET | ORAL | 0 refills | Status: DC
  Filled 2022-12-19: qty 78, 91d supply, fill #0

## 2022-12-20 ENCOUNTER — Other Ambulatory Visit (HOSPITAL_BASED_OUTPATIENT_CLINIC_OR_DEPARTMENT_OTHER): Payer: Self-pay

## 2022-12-20 ENCOUNTER — Other Ambulatory Visit: Payer: Self-pay

## 2022-12-20 NOTE — Progress Notes (Signed)
Sedimentation rate of 43 this is slightly less than last time although still above normal.  Hemoglobin 11.6 this is barely below normal value of 11.7 not a problem.  Her estimated GFR is 46 this is down from 57 3 months ago.  This indicates a small decrease in kidney function.  I am not sure if this is just a variation such as due to hydration status or an actual trend.  Do not think we need to adjust medication at this time but would have to look into it more if it stays low or trends down over time.

## 2022-12-21 LAB — CBC WITH DIFFERENTIAL/PLATELET
Absolute Lymphocytes: 1248 {cells}/uL (ref 850–3900)
Absolute Monocytes: 624 {cells}/uL (ref 200–950)
Basophils Absolute: 32 {cells}/uL (ref 0–200)
Basophils Relative: 0.8 %
Eosinophils Absolute: 112 {cells}/uL (ref 15–500)
Eosinophils Relative: 2.8 %
HCT: 36.2 % (ref 35.0–45.0)
Hemoglobin: 11.6 g/dL — ABNORMAL LOW (ref 11.7–15.5)
MCH: 30.4 pg (ref 27.0–33.0)
MCHC: 32 g/dL (ref 32.0–36.0)
MCV: 95 fL (ref 80.0–100.0)
MPV: 10.3 fL (ref 7.5–12.5)
Monocytes Relative: 15.6 %
Neutro Abs: 1984 {cells}/uL (ref 1500–7800)
Neutrophils Relative %: 49.6 %
Platelets: 221 10*3/uL (ref 140–400)
RBC: 3.81 10*6/uL (ref 3.80–5.10)
RDW: 14.8 % (ref 11.0–15.0)
Total Lymphocyte: 31.2 %
WBC: 4 10*3/uL (ref 3.8–10.8)

## 2022-12-21 LAB — COMPLETE METABOLIC PANEL WITH GFR
AG Ratio: 1.2 (calc) (ref 1.0–2.5)
ALT: 18 U/L (ref 6–29)
AST: 18 U/L (ref 10–35)
Albumin: 3.7 g/dL (ref 3.6–5.1)
Alkaline phosphatase (APISO): 87 U/L (ref 37–153)
BUN/Creatinine Ratio: 18 (calc) (ref 6–22)
BUN: 23 mg/dL (ref 7–25)
CO2: 28 mmol/L (ref 20–32)
Calcium: 8.8 mg/dL (ref 8.6–10.4)
Chloride: 107 mmol/L (ref 98–110)
Creat: 1.27 mg/dL — ABNORMAL HIGH (ref 0.50–1.05)
Globulin: 3 g/dL (ref 1.9–3.7)
Glucose, Bld: 79 mg/dL (ref 65–99)
Potassium: 5 mmol/L (ref 3.5–5.3)
Sodium: 140 mmol/L (ref 135–146)
Total Bilirubin: 0.3 mg/dL (ref 0.2–1.2)
Total Protein: 6.7 g/dL (ref 6.1–8.1)
eGFR: 46 mL/min/{1.73_m2} — ABNORMAL LOW (ref >=60)

## 2022-12-21 LAB — C-REACTIVE PROTEIN: CRP: 7.4 mg/L (ref <8.0)

## 2022-12-21 LAB — SEDIMENTATION RATE: Sed Rate: 43 mm/h — ABNORMAL HIGH (ref 0–30)

## 2023-01-09 ENCOUNTER — Ambulatory Visit: Payer: Medicare Other

## 2023-01-09 DIAGNOSIS — Z7901 Long term (current) use of anticoagulants: Secondary | ICD-10-CM | POA: Diagnosis not present

## 2023-01-09 LAB — POCT INR: INR: 2.6 (ref 2.0–3.0)

## 2023-01-09 NOTE — Patient Instructions (Addendum)
Pre visit review using our clinic review tool, if applicable. No additional management support is needed unless otherwise documented below in the visit note.  Continue 1/2 tablet daily except take 1 tablet on Mondays, Wednesdays and Fridays. Recheck in 5 weeks. Contact the coumadin clinic number at [phone], if any changes or any questions.

## 2023-01-09 NOTE — Progress Notes (Cosign Needed Addendum)
Continue 1/2 tablet daily except take 1 tablet on Mondays, Wednesdays and Fridays. Recheck in 5 weeks. Contact the coumadin clinic number at [phone], if any changes or any questions.   Medical screening examination/treatment/procedure(s) were performed by non-physician practitioner and as supervising physician I was immediately available for consultation/collaboration.  I agree with above. Jacinta Shoe, MD

## 2023-01-22 ENCOUNTER — Other Ambulatory Visit: Payer: Self-pay

## 2023-01-22 ENCOUNTER — Other Ambulatory Visit (HOSPITAL_BASED_OUTPATIENT_CLINIC_OR_DEPARTMENT_OTHER): Payer: Self-pay

## 2023-01-23 ENCOUNTER — Other Ambulatory Visit (HOSPITAL_BASED_OUTPATIENT_CLINIC_OR_DEPARTMENT_OTHER): Payer: Self-pay

## 2023-02-13 ENCOUNTER — Ambulatory Visit (INDEPENDENT_AMBULATORY_CARE_PROVIDER_SITE_OTHER): Payer: Medicare HMO

## 2023-02-13 DIAGNOSIS — Z7901 Long term (current) use of anticoagulants: Secondary | ICD-10-CM | POA: Diagnosis not present

## 2023-02-13 LAB — POCT INR: INR: 2.5 (ref 2.0–3.0)

## 2023-02-13 NOTE — Patient Instructions (Addendum)
 Pre visit review using our clinic review tool, if applicable. No additional management support is needed unless otherwise documented below in the visit note.  Continue 1/2 tablet daily except take 1 tablet on Mondays, Wednesdays and Fridays. Recheck in 5 weeks. Contact the coumadin clinic number at [phone], if any changes or any questions.

## 2023-02-13 NOTE — Progress Notes (Signed)
 Pt reports she will have some upcoming dental procedures. She will assure the dentist is aware she is taking warfarin and then let the coumadin  clinic know if they are requesting a hold of warfarin. Continue 1/2 tablet daily except take 1 tablet on Mondays, Wednesdays and Fridays. Recheck in 5 weeks. Contact the coumadin  clinic number at [phone], if any changes or any questions.

## 2023-02-19 ENCOUNTER — Encounter (HOSPITAL_BASED_OUTPATIENT_CLINIC_OR_DEPARTMENT_OTHER): Payer: Self-pay

## 2023-02-19 ENCOUNTER — Other Ambulatory Visit (HOSPITAL_BASED_OUTPATIENT_CLINIC_OR_DEPARTMENT_OTHER): Payer: Self-pay

## 2023-02-23 ENCOUNTER — Other Ambulatory Visit (HOSPITAL_BASED_OUTPATIENT_CLINIC_OR_DEPARTMENT_OTHER): Payer: Self-pay

## 2023-02-23 ENCOUNTER — Encounter: Payer: Self-pay | Admitting: Medical

## 2023-02-26 NOTE — Telephone Encounter (Signed)
Please load the correct pharmacy and correct med refill request. It should be xanax not adderall? Please resend me when corrected and pharmacy loaded.

## 2023-02-26 NOTE — Telephone Encounter (Signed)
Requesting: xanax Contract:09/27/22 UDS:03/22/22 Last Visit:09/20/22 Next Visit:03/19/23 Last Refill:10/21/22  Please Advise

## 2023-02-27 ENCOUNTER — Other Ambulatory Visit (HOSPITAL_BASED_OUTPATIENT_CLINIC_OR_DEPARTMENT_OTHER): Payer: Self-pay

## 2023-02-27 MED ORDER — ALPRAZOLAM 0.5 MG PO TABS: 0.5000 mg | ORAL_TABLET | Freq: Two times a day (BID) | ORAL | 0 refills | Status: DC | PRN

## 2023-02-27 NOTE — Telephone Encounter (Signed)
Pharmacy and medication updated

## 2023-03-07 NOTE — Progress Notes (Deleted)
 Office Visit Note  Patient: Rita Watkins             Date of Birth: 1955/06/05           MRN: [id number]             PCP: Marisue Brooklyn Referring: Marisue Brooklyn Visit Date: 03/21/2023   Subjective:  No chief complaint on file.   History of Present Illness: Rita Watkins is a 68 y.o. 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 weekly folic acid 1 mg daily.    Previous HPI 12/19/2022 Rita Watkins is a 68 y.o. 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 weekly folic acid 1 mg daily.  Overall feels arthritis symptoms are doing well.  No significant daily joint pain and stiffness and no major flareups.  She does notice cramping in her hands occurs pretty regularly often while working for prolonged time on the computer.  She often believes this with stretching or massaging her forearm muscles when it happens.  Not associated with any numbness loss of grip strength or unintentionally dropping items.   Previous HPI 09/18/2022 Rita Watkins is a 68 y.o. 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 weekly folic acid 1 mg daily.  Since her last visit she had an infection at left leg stump with a small ulcer or dehiscence lesion.  This cleared up and she completed antibiotics course. Did not contact our office or hold medications at the time.  New problem since her last visit issue with cramping or getting stuck in flexed position affecting the thumbs on both hands and other MCP joints on her right hand.  This comes and goes without warning sometimes at rest sometimes with activity such as hand grafts or while driving.  Releases after a few minutes.  Otherwise not having any flareup or increase of joint pain and swelling.   Previous HPI 06/14/2022 Rita Watkins is a 68 y.o. 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 weekly folic acid 1 mg daily.  Overall symptoms are doing well  she has not had any particular exacerbation and does not experience any daily joint pain or swelling in her hands.  Still has issues with left shoulder pain typically more use related such as doing repetitive lifting working in her garden.  Rates it most days as around 3 out of 10 in severity but can get up to 10 out of 10 occasionally.   03/16/22 Rita Watkins is a 68 y.o. 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 weekly and folic acid 1 mg daily.  Joint pain and swelling in her hands are doing well and left shoulder pain remains mostly improved.  She is noticing some increased pain and stiffness at the right shoulder which is a new problem.  It is not hurting at rest just with use and no radiation.  Still has some redness and irritation overlying the swelling at her right ankle with itching.   12/08/21 Rita Watkins is a 68 y.o. female here for follow up for evaluation of inflammatory arthritis concern for RA with repeat joint swelling, pain, and increased sedimentation rate. Since starting the methotrexate 15 mg PO weekly she feels a good improvement in symptoms with almost no morning stiffness and no flare ups. Shoulder and hand pains are better.  Still has about the same degree of leg swelling and redness on the lateral side. She does notice some headache after taking each weekly dose but it lasts less than one day duration. She notices occasional shoulder pain provoked with lifting cases of water.   10/25/2021 Rita Watkins is a 68 y.o. female here for follow up for evaluation of inflammatory arthritis concern for RA with repeat joint swelling, pain, and increased sedimentation rate and improvement on prednisone. Xrays at last visit showed no erosive disease and mild severity of OA changes. Lab tests were all negative for markers of ANA-related disease process. CBC and CMP okay, does have mild renal impairment. Since our visit she has a flare up of left shoulder pain limiting  mobility. Pain radiates into the arm when getting more severe. Her ankle remains the same amount swollen. No new episode of hand swelling.   Previous HPI 10/06/2021 Rita Watkins is a 68 y.o. 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 She developed pain and swelling involving ankle and in right 2nd finger that was treated with prednisone taper with good response in PCP office. Lab testing positive for RF and elevated uric acid level. She has had joint pain in multiple areas but symptoms significantly increased since earlier this year.  In May she developed severe pain involving the left shoulder and right hand particularly her second finger.  She saw significant swelling in the hands and around the right ankle.  She was treated with a oral prednisone course lasting 6 days and the symptoms stayed improved for a little over 1 month.  She had a recurrence of joint swelling in July that also improved quickly with oral prednisone.  Due to the swelling and warmth around the ankle she is been treated with oral antibiotics that did not significantly affect her symptoms. 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  Was complicated by ultimately developing sepsis requiring hospital visit and prolonged nursing care treatment course.   Labs reviewed 06/2021 ANA 1:80 cytoplasmic reticular/AMA RF 40 Uric acid 8.3 ESR 33 CPR <1   No Rheumatology ROS completed.   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   Peripheral vascular disease (HCC) 08/19/2016   Deep vein thrombosis (DVT) of popliteal vein of both lower extremities (HCC) 08/18/2016   Pulmonary embolism without acute cor pulmonale (HCC) 08/18/2016   Tobacco use 08/18/2016   Open leg wound, left, sequela 08/17/2016   Surgical wound, non healing 08/16/2016   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   Benign essential HTN 08/14/2016   Dizziness 07/19/2016   Nausea 07/19/2016   Necrosis (HCC) 07/19/2016   Mycobacterial infection 06/07/2016   Lymphangitis 06/07/2016   Mycobacterium infection, 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  CT abdomen 7/17:   1.  Large left retroperitoneal hematoma with mixed density blood products.  Assessment for active bleeding is not possible without IV contrast. 2.  Contrast in the renal collecting systems presumably from contrast enhanced studies performed on August 20, 2016 consistent with known impaired renal function.   CT abdomen 7/27:    Anxiety    Benign essential HTN 08/14/2016   Cardiac murmur 04/20/2020   Cigarette smoker 07/15/2014   COPD (chronic obstructive pulmonary disease) (HCC)    COPD (chronic obstructive pulmonary disease) (HCC)    Deep vein thrombosis (DVT) of popliteal vein of both lower extremities (HCC) 08/18/2016   Last Assessment & Plan:  Formatting of this note might be different from the original. Not clear if provoked/unprovoked (pt had trauma to left toe back in 03/2016 with some decreased mobility  thereafter) CT chest confirmed acute PE and also concerning for some bilateral upper lobe groundglass opacities Hematology on board and heparin drip started but  discontinued 7/17  2/2 large retroperitoneal he   Depression    Depression with anxiety 09/13/2012   Dizziness 07/19/2016   Essential hypertension 04/20/2020   History of pulmonary embolism 04/20/2020   Hyperlipidemia    Hypertension    Leukocytosis 07/30/2014   Lymphangitis 06/07/2016   Mycobacterial infection 06/07/2016   Mycobacterial infection 06/07/2016   Mycobacterium infection, 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 Per dermatology: Please provide the patient with our outpatient clinic phone number [phone]) to call and schedule a follow up appointment for continued management.   Last Assessment & Plan:  Formatting of this note might be different from the original. Uncertain etiology Vasculitic vs infectious vs malignancy Extensive work up has   Obesity 09/20/2016   Last Assessment & Plan:  Formatting of this note might be different from the original. Body mass index is 22.67 kg/m.  - For weight loss counseling.   Obesity (BMI 30.0-34.9) 04/20/2020   Open leg wound, left, sequela 08/17/2016   Formatting of this note might be different from the original. - Chronic left wound starting in April 2018 and worsening despite antibiotics and debridement -Patient had multiple hospitalizations and was treated with steroids and antibiotics with worsening/extension of the wound - ESR 103, CRP 168 - ANA, factor V Leyden, Antithrombin III, Cardiolite and antibody IgG, C3 and C4 complement proteinase   Peripheral vascular disease (HCC) 08/19/2016   Last Assessment & Plan:  Formatting of this note might be different from the original. Bilateral popliteal DVT seen on admission Occlusion of the left  common iliac with collateral blood flow to common femoral artery LLE angiogram on 08/22/16 for attempt at revascularization to optimize blood flow for healing Left AKA 7/24 I&D 7/31 and 8/2 with further procedures planned.  ASA 81 mg daily PT consult   Pulmonary embolism without acute cor pulmonale (HCC) 08/18/2016   Last Assessment & Plan:  Formatting of this note might be different from the original. Uncertain etiology  Was on heparin gtt but discontinued 2/2 large retroperitoneal hematoma (now stable on follow up CT) O2 PRN IVC placed  Vascular following No SOB, SpO2 93% on room air On warfarin,  on hold due to supratherapeutic INR, will restart at 3mg  once able   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   Surgical wound, non healing 08/16/2016   Formatting of this note might be different from the original. Added automatically from request for surgery [id number]  Last Assessment & Plan:  Formatting of this note might be different from the original. Presented with large area of necrotic tissue LLE Bilateral popliteal DVT Etiology uncertain:  Ischemic vs vasculitis vs autoimmune vs possible occult malignancy Bx: leukocytoblastic vasculopathy  In   Tobacco use 08/18/2016   Last Assessment & Plan:  Formatting of this note might be different from the original. Patient has a 30+ pack year history of smoking Discussed low dose CT chest for cancer screening at admission, patient open to this but concerned about extra cost as she has no insurance at this time. No obvious suspicious lesions noted on CTA PE study.  Counseled regarding tobacco cessation    Family History  Problem Relation Age of Onset   Hypertension Mother    Hyperlipidemia Mother    Osteoarthritis Mother    Rheum arthritis Mother    Arthritis Mother    Hypertension Brother    Asthma Brother    Osteoarthritis Brother    Obesity Brother    Past Surgical History:  Procedure Laterality Date   FOOT SURGERY     Left   HIP PINNING     3 each hip    IRRIGATION AND DEBRIDEMENT ABSCESS N/A 06/27/2016   Procedure: IRRIGATION AND DEBRIDEMENT LEFT LEG WITH SOTFT TISSUE BIOPSY;  Surgeon: Berna Bue, MD;  Location: [facility];  Service: General;  Laterality: N/A;   LEG AMPUTATION Left    LYMPH NODE BIOPSY Left 06/09/2016   Procedure: BIOPSY LEFT LEG;  Surgeon: Peggye Form, DO;  Location: [facility];  Service: Plastics;  Laterality: Left;   SLIPPED CAPITAL FEMORAL EPIPHYSIS PINNING     Social History   Social History Narrative   Does not exercise.   Immunization History  Administered Date(s) Administered   Fluad Quad(high Dose 65+) 12/18/2018, 11/01/2020, 11/03/2021   Fluad Trivalent(High Dose 65+) 10/17/2022   Influenza Split 11/20/2013   Influenza Whole 11/10/2012   Influenza, High Dose Seasonal PF 11/29/2015   Influenza,inj,Quad PF,6+ Mos 10/29/2014, 11/15/2017   PFIZER Comirnaty(Gray Top)Covid-19 Tri-Sucrose Vaccine 05/19/2020   PFIZER(Purple Top)SARS-COV-2 Vaccination 04/20/2019, 05/10/2019   Pfizer Covid-19 Vaccine Bivalent Booster 78yrs & up 11/01/2020   Pfizer(Comirnaty)Fall Seasonal Vaccine 12 years and older 12/07/2021   Pneumococcal Conjugate-13 12/18/2018   Pneumococcal Polysaccharide-23 09/14/2011   Tdap 04/09/2008     Objective: Vital Signs: There were no vitals taken for this visit.   Physical Exam   Musculoskeletal Exam: ***  CDAI Exam: CDAI Score: -- Patient Global: --; Provider Global: -- Swollen: --; Tender: -- Joint Exam 03/21/2023   No joint exam has been documented for this visit   There is currently no information documented on the homunculus. Go to the Rheumatology activity and complete the homunculus joint exam.  Investigation: No additional findings.  Imaging: No results found.  Recent Labs: Lab Results  Component Value Date   WBC 4.0 12/19/2022   HGB 11.6 (L) 12/19/2022   PLT 221 12/19/2022   NA 140 12/19/2022   K 5.0 12/19/2022   CL 107 12/19/2022   CO2 28 12/19/2022    GLUCOSE 79 12/19/2022   BUN 23 12/19/2022   CREATININE 1.27 (H) 12/19/2022   BILITOT 0.3 12/19/2022   ALKPHOS 73 08/08/2022   AST  18 12/19/2022   ALT 18 12/19/2022   PROT 6.7 12/19/2022   ALBUMIN 3.5 08/08/2022   CALCIUM 8.8 12/19/2022   GFRAA 72 08/05/2019    Speciality Comments: No specialty comments available.  Procedures:  No procedures performed Allergies: Avelox [moxifloxacin hcl in nacl], Montelukast sodium, and Pravastatin   Assessment / Plan:     Visit Diagnoses: No diagnosis found.  ***  Orders: No orders of the defined types were placed in this encounter.  No orders of the defined types were placed in this encounter.    Follow-Up Instructions: No follow-ups on file.   Metta Clines, RT  Note - This record has been created using [employer].  Chart creation errors have been sought, but may not always  have been located. Such creation errors do not reflect on  the standard of medical care.

## 2023-03-10 HISTORY — PX: OTHER SURGICAL HISTORY: SHX169

## 2023-03-13 NOTE — Progress Notes (Signed)
.  POV

## 2023-03-19 ENCOUNTER — Encounter: Payer: Self-pay | Admitting: Medical

## 2023-03-19 ENCOUNTER — Ambulatory Visit: Payer: Medicare Other | Admitting: Medical

## 2023-03-19 MED ORDER — BENAZEPRIL HCL 20 MG PO TABS: 20.0000 mg | ORAL_TABLET | Freq: Two times a day (BID) | ORAL | 3 refills | Status: DC

## 2023-03-19 MED ORDER — GABAPENTIN 300 MG PO CAPS: 300.0000 mg | ORAL_CAPSULE | Freq: Every day | ORAL | 3 refills | Status: DC

## 2023-03-20 ENCOUNTER — Ambulatory Visit: Payer: Medicare Other

## 2023-03-21 ENCOUNTER — Ambulatory Visit: Payer: Medicare Other | Admitting: Internal Medicine

## 2023-03-21 DIAGNOSIS — M059 Rheumatoid arthritis with rheumatoid factor, unspecified: Secondary | ICD-10-CM

## 2023-03-21 DIAGNOSIS — Z79899 Other long term (current) drug therapy: Secondary | ICD-10-CM

## 2023-03-21 DIAGNOSIS — M24849 Other specific joint derangements of unspecified hand, not elsewhere classified: Secondary | ICD-10-CM

## 2023-03-26 ENCOUNTER — Encounter: Payer: Self-pay | Admitting: Medical

## 2023-03-26 ENCOUNTER — Ambulatory Visit (INDEPENDENT_AMBULATORY_CARE_PROVIDER_SITE_OTHER): Payer: Medicare HMO | Admitting: Medical

## 2023-03-26 ENCOUNTER — Other Ambulatory Visit (HOSPITAL_BASED_OUTPATIENT_CLINIC_OR_DEPARTMENT_OTHER): Payer: Self-pay

## 2023-03-26 VITALS — BP 130/80 | HR 60 | Resp 18 | Ht 69.0 in | Wt 230.0 lb

## 2023-03-26 DIAGNOSIS — Z86718 Personal history of other venous thrombosis and embolism: Secondary | ICD-10-CM

## 2023-03-26 DIAGNOSIS — Z1211 Encounter for screening for malignant neoplasm of colon: Secondary | ICD-10-CM

## 2023-03-26 DIAGNOSIS — F419 Anxiety disorder, unspecified: Secondary | ICD-10-CM

## 2023-03-26 DIAGNOSIS — I10 Essential (primary) hypertension: Secondary | ICD-10-CM

## 2023-03-26 DIAGNOSIS — Z1231 Encounter for screening mammogram for malignant neoplasm of breast: Secondary | ICD-10-CM

## 2023-03-26 DIAGNOSIS — Z79899 Other long term (current) drug therapy: Secondary | ICD-10-CM

## 2023-03-26 DIAGNOSIS — E785 Hyperlipidemia, unspecified: Secondary | ICD-10-CM

## 2023-03-26 DIAGNOSIS — G546 Phantom limb syndrome with pain: Secondary | ICD-10-CM

## 2023-03-26 DIAGNOSIS — R944 Abnormal results of kidney function studies: Secondary | ICD-10-CM

## 2023-03-26 LAB — LIPID PANEL
Cholesterol: 196 mg/dL (ref 0–200)
HDL: 37.2 mg/dL — ABNORMAL LOW (ref >39.00)
LDL Cholesterol: 119 mg/dL — ABNORMAL HIGH (ref 0–99)
NonHDL: 159.2
Total CHOL/HDL Ratio: 5
Triglycerides: 201 mg/dL — ABNORMAL HIGH (ref 0.0–149.0)
VLDL: 40.2 mg/dL — ABNORMAL HIGH (ref 0.0–40.0)

## 2023-03-26 LAB — COMPREHENSIVE METABOLIC PANEL
ALT: 20 U/L (ref 0–35)
AST: 21 U/L (ref 0–37)
Albumin: 3.9 g/dL (ref 3.5–5.2)
Alkaline Phosphatase: 97 U/L (ref 39–117)
BUN: 16 mg/dL (ref 6–23)
CO2: 30 meq/L (ref 19–32)
Calcium: 8.6 mg/dL (ref 8.4–10.5)
Chloride: 103 meq/L (ref 96–112)
Creatinine, Ser: 1.01 mg/dL (ref 0.40–1.20)
GFR: 56.83 mL/min — ABNORMAL LOW (ref >60.00)
Glucose, Bld: 86 mg/dL (ref 70–99)
Potassium: 4.6 meq/L (ref 3.5–5.1)
Sodium: 141 meq/L (ref 135–145)
Total Bilirubin: 0.5 mg/dL (ref 0.2–1.2)
Total Protein: 6.7 g/dL (ref 6.0–8.3)

## 2023-03-26 MED ORDER — EZETIMIBE 10 MG PO TABS: 10.0000 mg | ORAL_TABLET | Freq: Every day | ORAL | 3 refills | Status: DC

## 2023-03-26 MED ORDER — PNEUMOCOCCAL 20-VAL CONJ VACC 0.5 ML IM SUSY
0.5000 mL | PREFILLED_SYRINGE | Freq: Once | INTRAMUSCULAR | 0 refills | Status: AC
  Filled 2023-03-26: qty 0.5, 1d supply, fill #0

## 2023-03-26 NOTE — Addendum Note (Signed)
Addended by: Gwenevere Abbot on: 03/26/2023 09:00 PM   Modules accepted: Orders

## 2023-03-26 NOTE — Progress Notes (Signed)
Subjective:    Patient ID: Rita Watkins, female    DOB: 10-May-1955, 68 y.o.   MRN: [id number]  HPI Discussed the use of AI scribe software for clinical note transcription with the patient, who gave verbal consent to proceed.  History of Present Illness   Rita Watkins is a 68 year old female who presents for a routine follow-up and drug screening.  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  Her blood pressure was recently measured at 170/150 over 70. She is currently taking amlodipine 2.5 mg daily and Lotensin 20 mg daily for blood pressure management. 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  Her kidney function was last checked in November, showing a creatinine level of 1.27 and a GFR of 46. She finds staying well-hydrated challenging due to her amputation and the need to use the restroom frequently.  She has a history of high cholesterol and experiences side effects from statins, leading to discontinuation of cholesterol medication. She has not had a mammogram in over eight years, but all previous results were normal. Her last colonoscopy was at age 34 and was normal, with no family history of colon cancer or polyps.  She has been on Coumadin for six years following a DVT and subsequent amputation. She inquires about alternative medications that require less frequent monitoring. She recently obtained insurance, which may affect her medication options.  She recently had dental surgery, resulting in temporary dentures and stitches, which has limited her diet to soft foods like Jell-O and ice cream for the past two weeks.  No recent fever or significant changes in her health aside from the issues discussed.         Review of Systems  Constitutional:  Negative for chills, fatigue and fever.  Respiratory:  Negative for cough, chest tightness  and wheezing.   Cardiovascular:  Negative for chest pain and palpitations.  Gastrointestinal:  Negative for abdominal pain.  Neurological:  Negative for dizziness, syncope, weakness and headaches.  Hematological:  Negative for adenopathy.   Past Medical History:  Diagnosis Date   Allergy    Anemia associated with acute blood loss 08/22/2016   Formatting of this note is different from the original.  CT abdomen 7/17:   1.  Large left retroperitoneal hematoma with mixed density blood products.  Assessment for active bleeding is not possible without IV contrast. 2.  Contrast in the renal collecting systems presumably from contrast enhanced studies performed on August 20, 2016 consistent with known impaired renal function.   CT abdomen 7/27:    Anxiety    Benign essential HTN 08/14/2016   Cardiac murmur 04/20/2020   Cigarette smoker 07/15/2014   COPD (chronic obstructive pulmonary disease) (HCC)    COPD (chronic obstructive pulmonary disease) (HCC)    Deep vein thrombosis (DVT) of popliteal vein of both lower extremities (HCC) 08/18/2016   Last Assessment & Plan:  Formatting of this note might be different from the original. Not clear if provoked/unprovoked (pt had trauma to left toe back in 03/2016 with some decreased mobility thereafter) CT chest confirmed acute PE and also concerning for some bilateral upper lobe groundglass opacities Hematology on board and heparin drip started but  discontinued 7/17  2/2 large retroperitoneal he   Depression    Depression with anxiety 09/13/2012   Dizziness 07/19/2016   Essential  hypertension 04/20/2020   History of pulmonary embolism 04/20/2020   Hyperlipidemia    Hypertension    Leukocytosis 07/30/2014   Lymphangitis 06/07/2016   Mycobacterial infection 06/07/2016   Mycobacterial infection 06/07/2016   Mycobacterium infection, 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 Per dermatology: Please provide the patient with our outpatient clinic phone number [phone]) to call and schedule a follow up appointment for continued management.   Last Assessment & Plan:  Formatting of this note might be different from the original. Uncertain etiology Vasculitic vs infectious vs malignancy Extensive work up has   Obesity 09/20/2016   Last Assessment & Plan:  Formatting of this note might be different from the original. Body mass index is 22.67 kg/m.  - For weight loss counseling.   Obesity (BMI 30.0-34.9) 04/20/2020   Open leg wound, left, sequela 08/17/2016   Formatting of this note might be different from the original. - Chronic left wound starting in April 2018 and worsening despite antibiotics and debridement -Patient had multiple hospitalizations and was treated with steroids and antibiotics with worsening/extension of the wound - ESR 103, CRP 168 - ANA, factor V Leyden, Antithrombin III, Cardiolite and antibody IgG, C3 and C4 complement proteinase   Peripheral vascular disease (HCC) 08/19/2016   Last Assessment & Plan:  Formatting of this note might be different from the original. 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  ASA 81 mg daily PT consult   Pulmonary embolism without acute cor pulmonale (HCC) 08/18/2016   Last Assessment & Plan:  Formatting of this note might be different from the original. Uncertain etiology  Was on heparin gtt but discontinued 2/2 large retroperitoneal hematoma (now stable on follow up CT) O2 PRN IVC placed  Vascular following No SOB, SpO2 93% on room air On warfarin, on hold due to supratherapeutic INR, will restart at 3mg  once able   Pyoderma gangrenosa  08/14/2016   Surgical wound, non healing 08/16/2016   Formatting of this note might be different from the original. Added automatically from request for surgery [id number]  Last Assessment & Plan:  Formatting of this note might be different from the original. Presented with large area of necrotic tissue LLE Bilateral popliteal DVT Etiology uncertain:  Ischemic vs vasculitis vs autoimmune vs possible occult malignancy Bx: leukocytoblastic vasculopathy  In   Tobacco use 08/18/2016   Last Assessment & Plan:  Formatting of this note might be different from the original. Patient has a 30+ pack year history of smoking Discussed low dose CT chest for cancer screening at admission, patient open to this but concerned about extra cost as she has no insurance at this time. No obvious suspicious lesions noted on CTA PE study.  Counseled regarding tobacco cessation     Social History   Socioeconomic History   Marital status: Widowed    Spouse name: Not on file   Number of children: Not on file   Years of education: high schoo   Highest education level: 12th grade  Occupational History   Occupation: ADM. ASSISTANT    Employer: [employer]  Tobacco Use   Smoking status: Former    Current packs/day:  0.00    Average packs/day: 1 pack/day for 35.0 years (35.0 ttl pk-yrs)    Types: Cigarettes    Start date: 09/14/1981    Quit date: 09/14/2016    Years since quitting: 6.5    Passive exposure: Past   Smokeless tobacco: Never  Vaping Use   Vaping status: Never Used  Substance and Sexual Activity   Alcohol use: No    Alcohol/week: 0.0 standard drinks of alcohol   Drug use: No   Sexual activity: Not Currently    Birth control/protection: Post-menopausal  Other Topics Concern   Not on file  Social History Narrative   Does not exercise.   Social Drivers of Corporate investment banker Strain: Low Risk  (03/19/2023)   Overall Financial Resource Strain (CARDIA)    Difficulty of Paying Living Expenses: Not very  hard  Food Insecurity: Food Insecurity Present (03/19/2023)   Hunger Vital Sign    Worried About Running Out of Food in the Last Year: Sometimes true    Ran Out of Food in the Last Year: Never true  Transportation Needs: No Transportation Needs (03/19/2023)   PRAPARE - Administrator, Civil Service (Medical): No    Lack of Transportation (Non-Medical): No  Physical Activity: Inactive (03/19/2023)   Exercise Vital Sign    Days of Exercise per Week: 0 days    Minutes of Exercise per Session: 10 min  Stress: Stress Concern Present (03/19/2023)   [employer] of Occupational Health - Occupational Stress Questionnaire    Feeling of Stress : To some extent  Social Connections: Moderately Integrated (03/19/2023)   Social Connection and Isolation Panel [NHANES]    Frequency of Communication with Friends and Family: More than three times a week    Frequency of Social Gatherings with Friends and Family: Once a week    Attends Religious Services: More than 4 times per year    Active Member of [employer] or Organizations: Yes    Attends Banker Meetings: More than 4 times per year    Marital Status: Widowed  Intimate Partner Violence: Not At Risk (10/17/2022)   Humiliation, Afraid, Rape, and Kick questionnaire    Fear of Current or Ex-Partner: No    Emotionally Abused: No    Physically Abused: No    Sexually Abused: No    Past Surgical History:  Procedure Laterality Date   FOOT SURGERY     Left   HIP PINNING     3 each hip   IRRIGATION AND DEBRIDEMENT ABSCESS N/A 06/27/2016   Procedure: IRRIGATION AND DEBRIDEMENT LEFT LEG WITH SOTFT TISSUE BIOPSY;  Surgeon: Berna Bue, MD;  Location: [facility];  Service: General;  Laterality: N/A;   LEG AMPUTATION Left    LYMPH NODE BIOPSY Left 06/09/2016   Procedure: BIOPSY LEFT LEG;  Surgeon: Peggye Form, DO;  Location: [facility];  Service: Plastics;  Laterality: Left;   SLIPPED CAPITAL FEMORAL EPIPHYSIS PINNING       Family History  Problem Relation Age of Onset   Hypertension Mother    Hyperlipidemia Mother    Osteoarthritis Mother    Rheum arthritis Mother    Arthritis Mother    Hypertension Brother    Asthma Brother    Osteoarthritis Brother    Obesity Brother     Allergies  Allergen Reactions   Avelox [Moxifloxacin Hcl In Nacl] Anaphylaxis   Montelukast Sodium Other (See Comments)    Insomnia   Pravastatin Other (See Comments)  Myalgias on 20mg     Current Outpatient Medications on File Prior to Visit  Medication Sig Dispense Refill   albuterol (VENTOLIN HFA) 108 (90 Base) MCG/ACT inhaler Inhale 2 puffs into the lungs every 6 (six) hours as needed. 6.7 g 0   ALPRAZolam (XANAX) 0.5 MG tablet Take 1 tablet (0.5 mg total) by mouth 2 (two) times daily as needed for anxiety. 60 tablet 0   amLODipine (NORVASC) 2.5 MG tablet Take 1 tablet (2.5 mg total) by mouth daily. 90 tablet 1   benazepril (LOTENSIN) 20 MG tablet Take 1 tablet (20 mg total) by mouth 2 (two) times daily. 180 tablet 3   folic acid (FOLVITE) 1 MG tablet Take 1 tablet (1 mg total) by mouth daily. 90 tablet 3   gabapentin (NEURONTIN) 300 MG capsule Take 1 capsule (300 mg total) by mouth at bedtime. 90 capsule 3   methotrexate (RHEUMATREX) 2.5 MG tablet Take 6 tablets (15 mg total) by mouth once a week. Caution: Chemotherapy. Protect from light. 78 tablet 0   warfarin (COUMADIN) 5 MG tablet TAKE 1/2 TABLET BY MOUTH DAILY EXCEPT TAKE 1 TABLET ON MONDAY, WEDNESDAY AND FRIDAY OR AS DIRECTED BY ANTICOAGULATION CLINIC 75 tablet 1   [DISCONTINUED] DULoxetine (CYMBALTA) 30 MG capsule Take 1 capsule (30 mg total) by mouth daily. 30 capsule 0   No current facility-administered medications on file prior to visit.    BP 130/80   Pulse 60   Resp 18   Ht 5\' 9"  (1.753 m)   Wt 230 lb (104.3 kg)   SpO2 99%   BMI 33.97 kg/m       Objective:   Physical Exam  General Mental Status- Alert. General Appearance- Not in acute  distress.   Skin General: Color- Normal Color. Moisture- Normal Moisture.  Neck Carotid Arteries- Normal color. Moisture- Normal Moisture. No carotid bruits. No JVD.  Chest and Lung Exam Auscultation: Breath Sounds:-Normal.  Cardiovascular Auscultation:Rythm- Regular. Murmurs & Other Heart Sounds:Auscultation of the heart reveals- No Murmurs.  Abdomen Inspection:-Inspeection Normal. Palpation/Percussion:Note:No mass. Palpation and Percussion of the abdomen reveal- Non Tender, Non Distended + BS, no rebound or guarding.    Neurologic Cranial Nerve exam:- CN III-XII intact(No nystagmus), symmetric smile. Drift Test:- No drift. Romberg Exam:- Negative.  Heal to Toe Gait exam:-Normal. Finger to Nose:- Normal/Intact Strength:- 5/5 equal and symmetric strength both upper and lower extremities.       Assessment & Plan:   Assessment and Plan    Assessment and Plan    Anxiety Stable on Xanax. Due for drug screen and contract renewal. -Perform oral drug tox monitor today. -Renew controlled substance contract.  Hypertension Elevated blood pressure today 130/70 recheck.). Currently on Amlodipine 2.5mg  daily and Lotensin 20mg  daily. -continue same bp meds. Check bp at home to verify not higher than 140/90.  Phantom Pain (Left Lower Extremity) Stable on Gabapentin 300mg  at night. -Continue Gabapentin 300mg  at night.  Hyperlipidemia History of statin intolerance. Now has insurance and may have more options for lipid-lowering therapy. -Check lipid panel today. -Review cardiology notes and consider re-consultation.  Chronic Kidney Disease Last GFR was 46 three months ago. Patient advised to hydrate well before labs. -Check metabolic panel today.  General Health Maintenance -Order Cologuard for colorectal cancer screening. -Order mammogram for breast cancer screening. -Administer PCV20 vaccine today. -Consider Tdap and Shingrix vaccines for future visits.  -Follow-up  appointment to be determined after lab review.       Esperanza Richters, PA-C

## 2023-03-26 NOTE — Patient Instructions (Addendum)
Anxiety Stable on Xanax. Due for drug screen and contract renewal. -Perform oral drug tox monitor today. -Renew controlled substance contract.  Hypertension Elevated blood pressure today 130/80 recheck.). Currently on Amlodipine 2.5mg  daily and Lotensin 20mg  daily. -continue same bp meds. Check bp at home to verify not higher than 140/90.  Phantom Pain (Left Lower Extremity) Stable on Gabapentin 300mg  at night. -Continue Gabapentin 300mg  at night.  Hyperlipidemia History of statin intolerance. Now has insurance and may have more options for lipid-lowering therapy. -Check lipid panel today. -Review cardiology notes and consider re-consultation.  Chronic Kidney Disease Last GFR was 46 three months ago. Patient advised to hydrate well before labs. -Check metabolic panel today.  General Health Maintenance -Order Cologuard for colorectal cancer screening. -Order mammogram for breast cancer screening. -Administer PCV20 vaccine(thru pharmacy) -Consider Tdap and Shingrix vaccines for future visits.  -Follow-up appointment to be determined after lab review.

## 2023-03-27 ENCOUNTER — Other Ambulatory Visit: Payer: Self-pay | Admitting: *Deleted

## 2023-03-27 ENCOUNTER — Encounter: Payer: Self-pay | Admitting: Medical

## 2023-03-27 ENCOUNTER — Ambulatory Visit (INDEPENDENT_AMBULATORY_CARE_PROVIDER_SITE_OTHER): Payer: Medicare HMO

## 2023-03-27 DIAGNOSIS — M059 Rheumatoid arthritis with rheumatoid factor, unspecified: Secondary | ICD-10-CM

## 2023-03-27 DIAGNOSIS — Z7901 Long term (current) use of anticoagulants: Secondary | ICD-10-CM

## 2023-03-27 DIAGNOSIS — Z79899 Other long term (current) drug therapy: Secondary | ICD-10-CM

## 2023-03-27 LAB — POCT INR: INR: 3.5 — AB (ref 2.0–3.0)

## 2023-03-27 MED ORDER — METHOTREXATE SODIUM 2.5 MG PO TABS: 15.0000 mg | ORAL_TABLET | ORAL | 0 refills | Status: DC

## 2023-03-27 NOTE — Progress Notes (Signed)
Pt started Zetia. Pt also had all upper teeth removed for dentures about 2 weeks ago. She held warfarin for 3 days. Coumadin clinic was not aware of the hold. She has been using 600 mg ibuprofen for pain relief and has been taking that every morning.  Advised ibuprofen interacts with warfarin and increases bleeding risk. Advised pt to use tylenol. Pt verbalized understanding. Due to interaction with ibuprofen and pt has been stable on current dosing for several months there will not be a change to weekly dosing. Pt already took warfarin today. Hold warfarin tomorrow and then continue 1/2 tablet daily except take 1 tablet on Mondays, Wednesdays and Fridays. Recheck in 3 weeks. Contact the coumadin clinic number at [phone], if any changes or any questions.

## 2023-03-27 NOTE — Telephone Encounter (Signed)
Patient contacted the office to request a medication refill.   1. Name of Medication: Methotrexate  2. How are you currently taking this medication (dosage and times per day)? 6 tabs once weekly   3. What pharmacy would you like for that to be sent to? [facility]

## 2023-03-27 NOTE — Patient Instructions (Addendum)
Pre visit review using our clinic review tool, if applicable. No additional management support is needed unless otherwise documented below in the visit note.  Hold warfarin tomorrow and then continue 1/2 tablet daily except take 1 tablet on Mondays, Wednesdays and Fridays.

## 2023-03-27 NOTE — Telephone Encounter (Signed)
Last Fill: 12/19/2022  Labs: 03/26/2023 GFR 56.83, 12/19/2022 Hgb 11.6 (Patient will come in within the next week to update CBC)  Next Visit: 05/09/2023  Last Visit: 12/19/2022  DX: Seropositive rheumatoid arthritis   Current Dose per office note 12/19/2022: methotrexate 15 mg p.o. weekly   Okay to refill Methotrexate?

## 2023-03-28 ENCOUNTER — Telehealth: Payer: Self-pay

## 2023-03-28 NOTE — Progress Notes (Signed)
Care Guide Pharmacy Note  03/28/2023 Name: Rita Watkins MRN: [id number] DOB: 1955/12/14  Referred By: Esperanza Richters, PA-C Reason for referral: Care Coordination (Outreach to schedule with Pharm d )   Rita Watkins is a 68 y.o. year old female who is a primary care patient of Saguier, Ramon Dredge, [place].  Rita Watkins was referred to the pharmacist for assistance related to: HTN and HLD  Successful contact was made with the patient to discuss pharmacy services including being ready for the pharmacist to call at least 5 minutes before the scheduled appointment time and to have medication bottles and any blood pressure readings ready for review. The patient agreed to meet with the pharmacist via telephone visit on (date/time).04/11/2023  Rita Watkins , RMA     [place]  [facility], [facility] Guide  Direct Dial: [phone]  Website: [email or web address]

## 2023-03-29 LAB — DRUG TOX MONITOR 1 W/CONF, ORAL FLD
Alprazolam: 5.32 ng/mL — ABNORMAL HIGH (ref <0.50)
Aminoclonazepam: NEGATIVE ng/mL (ref <0.50)
Amphetamines: NEGATIVE ng/mL (ref <10)
Barbiturates: NEGATIVE ng/mL (ref <10)
Benzodiazepines: POSITIVE ng/mL — AB (ref <0.50)
Buprenorphine: NEGATIVE ng/mL (ref <0.10)
Chlordiazepoxide: NEGATIVE ng/mL (ref <0.50)
Clonazepam: NEGATIVE ng/mL (ref <0.50)
Cocaine: NEGATIVE ng/mL (ref <5.0)
Diazepam: NEGATIVE ng/mL (ref <0.50)
Fentanyl: NEGATIVE ng/mL (ref <0.10)
Flunitrazepam: NEGATIVE ng/mL (ref <0.50)
Flurazepam: NEGATIVE ng/mL (ref <0.50)
Heroin Metabolite: NEGATIVE ng/mL (ref <1.0)
Lorazepam: NEGATIVE ng/mL (ref <0.50)
MARIJUANA: NEGATIVE ng/mL (ref <2.5)
MDMA: NEGATIVE ng/mL (ref <10)
Meprobamate: NEGATIVE ng/mL (ref <2.5)
Methadone: NEGATIVE ng/mL (ref <5.0)
Midazolam: NEGATIVE ng/mL (ref <0.50)
Nicotine Metabolite: NEGATIVE ng/mL (ref <5.0)
Nordiazepam: 0.57 ng/mL — ABNORMAL HIGH (ref <0.50)
Opiates: NEGATIVE ng/mL (ref <2.5)
Oxazepam: NEGATIVE ng/mL (ref <0.50)
Phencyclidine: NEGATIVE ng/mL (ref <10)
Tapentadol: NEGATIVE ng/mL (ref <5.0)
Temazepam: NEGATIVE ng/mL (ref <0.50)
Tramadol: NEGATIVE ng/mL (ref <5.0)
Triazolam: NEGATIVE ng/mL (ref <0.50)
Zolpidem: NEGATIVE ng/mL (ref <5.0)

## 2023-04-01 ENCOUNTER — Other Ambulatory Visit: Payer: Self-pay | Admitting: Medical

## 2023-04-02 ENCOUNTER — Other Ambulatory Visit: Payer: Self-pay | Admitting: *Deleted

## 2023-04-02 DIAGNOSIS — Z79899 Other long term (current) drug therapy: Secondary | ICD-10-CM | POA: Diagnosis not present

## 2023-04-02 LAB — CBC WITH DIFFERENTIAL/PLATELET
Absolute Lymphocytes: 1254 {cells}/uL (ref 850–3900)
Absolute Monocytes: 497 {cells}/uL (ref 200–950)
Basophils Absolute: 40 {cells}/uL (ref 0–200)
Basophils Relative: 0.9 %
Eosinophils Absolute: 150 {cells}/uL (ref 15–500)
Eosinophils Relative: 3.4 %
HCT: 38.4 % (ref 35.0–45.0)
Hemoglobin: 12 g/dL (ref 11.7–15.5)
MCH: 31.2 pg (ref 27.0–33.0)
MCHC: 31.3 g/dL — ABNORMAL LOW (ref 32.0–36.0)
MCV: 99.7 fL (ref 80.0–100.0)
MPV: 10 fL (ref 7.5–12.5)
Monocytes Relative: 11.3 %
Neutro Abs: 2460 {cells}/uL (ref 1500–7800)
Neutrophils Relative %: 55.9 %
Platelets: 200 10*3/uL (ref 140–400)
RBC: 3.85 10*6/uL (ref 3.80–5.10)
RDW: 14.1 % (ref 11.0–15.0)
Total Lymphocyte: 28.5 %
WBC: 4.4 10*3/uL (ref 3.8–10.8)

## 2023-04-02 NOTE — Telephone Encounter (Signed)
 Rx refills sent to pharmacy.

## 2023-04-02 NOTE — Telephone Encounter (Signed)
 Requesting: Xanax Contract:03/26/23 UDS:03/26/23 Last Visit:03/26/23 Next Visit:n/a Last Refill:02/27/23  Please Advise

## 2023-04-03 NOTE — Progress Notes (Signed)
 Complete blood count was normal.  I appreciate her getting this test drawn so we can safely monitor the medication.  Her other labs including the complete metabolic panel were already obtained by Dr. Alvira Monday on the 17th also did not show any problems for her medication.

## 2023-04-05 ENCOUNTER — Inpatient Hospital Stay (HOSPITAL_BASED_OUTPATIENT_CLINIC_OR_DEPARTMENT_OTHER): Admission: RE | Admit: 2023-04-05 | Payer: Medicare HMO | Source: Ambulatory Visit

## 2023-04-11 ENCOUNTER — Other Ambulatory Visit (HOSPITAL_COMMUNITY): Payer: Self-pay

## 2023-04-11 ENCOUNTER — Ambulatory Visit (INDEPENDENT_AMBULATORY_CARE_PROVIDER_SITE_OTHER): Payer: Medicare HMO | Admitting: Pharmacist

## 2023-04-11 ENCOUNTER — Encounter: Payer: Self-pay | Admitting: Pharmacist

## 2023-04-11 ENCOUNTER — Other Ambulatory Visit: Payer: Self-pay

## 2023-04-11 DIAGNOSIS — M059 Rheumatoid arthritis with rheumatoid factor, unspecified: Secondary | ICD-10-CM

## 2023-04-11 DIAGNOSIS — Z86718 Personal history of other venous thrombosis and embolism: Secondary | ICD-10-CM

## 2023-04-11 MED ORDER — FOLIC ACID 1 MG PO TABS
1.0000 mg | ORAL_TABLET | Freq: Every day | ORAL | 0 refills | Status: DC
  Filled 2023-04-11 (×2): qty 90, 90d supply, fill #0

## 2023-04-11 NOTE — Progress Notes (Signed)
 04/11/2023 Name: Rita Watkins MRN: [id number] DOB: Jan 26, 1956  Chief Complaint  Patient presents with   Medication Management    Rita Watkins is a 68 y.o. year old female who presented for a telephone visit.   They were referred to the pharmacist by their PCP for assistance in managing medication access.    Subjective:  Patient is interested in changing from warfarin to either Xarelto or Eliqusi due to difficulty getting into office and fewere drug and food interactions. She is amputee and requires some assistance. She mentions that he has been more difficult to get office staff for assistance with new phone system. She previously was able to call office and ask for someone to come down to parking lot to assist her into wheelchair and into office. The longer process is sometimes taking 15 to 30 mintues.   Taking warfarin for history of DVT.   She also mentions that her insurance is no long covering the cost of folic acid. She previously was using [facility] but [facility]ia Pouch [facility] to get [facility] cost for medications. Patient is wheelchair bound and prefers to get medications mailed to her.   Patient also mentions that she has to pay a $17 / month penalty because for the first 4 year she has Medicare she did not have a Part D plan (she did not know she needed it or would get a penalty).   Medication Access/Adherence  [facility]  [facility] [address] [place] [facility] [address] [place] [address] Phone: [phone] Fax: [phone]  [place] [facility] [address] [place] [address] Phone: [phone] Fax: [phone]   Objective:  Lab Results  Component Value Date   HGBA1C 5.7 07/31/2022    Lab Results  Component Value Date   CREATININE 1.01 03/26/2023   BUN 16 03/26/2023   NA 141 03/26/2023   K 4.6 03/26/2023   CL 103 03/26/2023   CO2 30 03/26/2023    Lab Results   Component Value Date   CHOL 196 03/26/2023   HDL 37.20 (L) 03/26/2023   LDLCALC 119 (H) 03/26/2023   LDLDIRECT 165.0 03/03/2020   TRIG 201.0 (H) 03/26/2023   CHOLHDL 5 03/26/2023    Medications Reviewed Today     Reviewed by Henrene Pastor, RPH-CPP (Pharmacist) on 04/11/23 at 1115  Med List Status: <None>   Medication Order Taking? Sig Documenting Provider Last Dose Status Informant  albuterol (VENTOLIN HFA) 108 (90 Base) MCG/ACT inhaler [id number] No Inhale 2 puffs into the lungs every 6 (six) hours as needed. Saguier, Ramon Dredge, PA-C Taking Active   ALPRAZolam Prudy Feeler) 0.5 MG tablet [id number]  TAKE 1 TABLET BY MOUTH 2 TIMES DAILY AS NEEDED FOR ANXIETY. Saguier, Ramon Dredge, PA-C  Active   amLODipine (NORVASC) 2.5 MG tablet [id number] No Take 1 tablet (2.5 mg total) by mouth daily. Saguier, Ramon Dredge, PA-C Taking Active   benazepril (LOTENSIN) 20 MG tablet [id number]  Take 1 tablet (20 mg total) by mouth 2 (two) times daily. Esperanza Richters, [place]  Active   Discontinued 03/03/20 1112   ezetimibe (ZETIA) 10 MG tablet [id number]  Take 1 tablet (10 mg total) by mouth daily. Saguier, Ramon Dredge, PA-C  Active   folic acid (FOLVITE) 1 MG tablet [id number] No Take 1 tablet (1 mg total) by mouth daily. Fuller Plan, MD Taking Active   gabapentin (NEURONTIN) 300 MG capsule [id number]  Take 1 capsule (300 mg total) by mouth  at bedtime. Saguier, Ramon Dredge, PA-C  Active   methotrexate (RHEUMATREX) 2.5 MG tablet [id number]  Take 6 tablets (15 mg total) by mouth once a week. Caution: Chemotherapy. Protect from light. Fuller Plan, MD  Active   warfarin (COUMADIN) 5 MG tablet [id number] No TAKE 1/2 TABLET BY MOUTH DAILY EXCEPT TAKE 1 TABLET ON MONDAY, WEDNESDAY AND FRIDAY OR AS DIRECTED BY ANTICOAGULATION CLINIC Saguier, [place], [place] Taking Active               Assessment/Plan:   Medication Management - difficulty getting to office for warfarin INR checks - would like to consider alternatives - checked her  2025 [employer] formulary. She has [employer] plan [phone]. Eliquis and Xarelto are tier 3 but her portion of the cost / copay would be 25% of medication cost which would be around $153 / month. She is paying < $3 for 90 days of warfarin.  - screened for LIS / Medicare Extra Help - patient does not qualify.  - Screen for medication assistance program - Eliquis program requires that she spend 3% of income out of pocket before she would be eligible - patient prefers not to do this.  - She will continue warfarin and INR checks as recommended. There are potential changes in Medicare cost for Eliquis and Xarelto for 2026. Plan to follow up with patient in October 2025 to review Medicare plan options.  - Discussed getting folic acid thru [facility] - it is on the $5 / 30 day list or $15 / 90 days. Patient given [facility] number to call to provided payment information and verify address.    Henrene Pastor, PharmD Clinical Pharmacist [facility]

## 2023-04-17 ENCOUNTER — Ambulatory Visit (INDEPENDENT_AMBULATORY_CARE_PROVIDER_SITE_OTHER): Payer: Medicare HMO

## 2023-04-17 DIAGNOSIS — Z7901 Long term (current) use of anticoagulants: Secondary | ICD-10-CM

## 2023-04-17 LAB — POCT INR: INR: 4.7 — AB (ref 2.0–3.0)

## 2023-04-17 NOTE — Progress Notes (Signed)
 Pt started Zetia about 2 weeks ago. This interacts with warfarin and increase bleeding risk. Pt denies any other changes. Pt already took warfarin today. Hold warfarin tomorrow and hold warfarin the day after tomorrow and then change weekly dose to take 1/2 tablet daily except take 1 tablet on Fridays. Recheck in 2 weeks. Contact the coumadin clinic number at [phone], if any changes or any questions.

## 2023-04-17 NOTE — Patient Instructions (Addendum)
 Pre visit review using our clinic review tool, if applicable. No additional management support is needed unless otherwise documented below in the visit note.  Hold warfarin tomorrow and hold warfarin the day after tomorrow and then change weekly dose to take 1/2 tablet daily except take 1 tablet on Fridays.

## 2023-04-26 NOTE — Progress Notes (Signed)
 Office Visit Note  Patient: Rita Watkins             Date of Birth: November 19, 1955           MRN: [id number]             PCP: Francine Iron Referring: Francine Iron Visit Date: 05/09/2023   Subjective:  Follow-up   Discussed the use of AI scribe software for clinical note transcription with the patient, who gave verbal consent to proceed.  History of Present Illness   Rita Watkins is a 68 y.o. 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 weekly folic acid 1 mg daily.    Rheumatoid arthritis has improved with methotrexate treatment, as indicated by a decrease in sedimentation rate from 106 to 43. She experiences minimal symptoms currently and notes significant improvement with the medication.  Her shoulder arthritis flares with weather changes, particularly affecting the shoulder. The pain does not radiate down her arm and does not disturb her sleep, although discomfort occurs if she sleeps on the shoulder.  Leg swelling has significantly improved with the use of compression socks, allowing her to wear different pairs of shoes instead of slippers. No recent illness reported.  She attributes some redness on her leg to a space heater near her recliner, which she has adjusted to rotate instead of direct heat.    Previous HPI 12/19/2022 Rita Watkins is a 68 y.o. 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 weekly folic acid 1 mg daily.  Overall feels arthritis symptoms are doing well.  No significant daily joint pain and stiffness and no major flareups.  She does notice cramping in her hands occurs pretty regularly often while working for prolonged time on the computer.  She often believes this with stretching or massaging her forearm muscles when it happens.  Not associated with any numbness loss of grip strength or unintentionally dropping items.   Previous HPI 09/18/2022 Rita Watkins is a 68 y.o. 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 weekly folic acid 1 mg daily.  Since her last visit she had an infection at left leg stump with a small ulcer or dehiscence lesion.  This cleared up and she completed antibiotics course. Did not contact our office or hold medications at the time.  New problem since her last visit issue with cramping or getting stuck in flexed position affecting the thumbs on both hands and other MCP joints on her right hand.  This comes and goes without warning sometimes at rest sometimes with activity such as hand grafts or while driving.  Releases after a few minutes.  Otherwise not having any flareup or increase of joint pain and swelling.   Previous HPI 06/14/2022 Rita Watkins is a 68 y.o. 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 weekly folic acid 1 mg daily.  Overall symptoms are doing well she has not had any particular exacerbation and does not experience any daily joint pain or swelling in her hands.  Still has issues with left shoulder pain typically more use related such as doing repetitive lifting working in her garden.  Rates it most days as around 3 out of 10 in severity but can get up to 10 out of 10 occasionally.   03/16/22 Rita Watkins is a 68 y.o. 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 weekly and folic acid 1 mg daily.  Joint pain and swelling in her hands are doing well and left shoulder pain remains mostly improved.  She is noticing some increased pain and stiffness at the right shoulder which is a new problem.  It is not hurting at rest just with use and no radiation.  Still has some redness and irritation overlying the swelling at her right ankle with itching.   12/08/21 Rita Watkins is a 68 y.o. female here for follow up for evaluation of inflammatory arthritis concern for RA with repeat joint swelling, pain, and increased sedimentation rate. Since starting the methotrexate 15 mg PO  weekly she feels a good improvement in symptoms with almost no morning stiffness and no flare ups. Shoulder and hand pains are better. Still has about the same degree of leg swelling and redness on the lateral side. She does notice some headache after taking each weekly dose but it lasts less than one day duration. She notices occasional shoulder pain provoked with lifting cases of water.   10/25/2021 Rita Watkins is a 68 y.o. female here for follow up for evaluation of inflammatory arthritis concern for RA with repeat joint swelling, pain, and increased sedimentation rate and improvement on prednisone. Xrays at last visit showed no erosive disease and mild severity of OA changes. Lab tests were all negative for markers of ANA-related disease process. CBC and CMP okay, does have mild renal impairment. Since our visit she has a flare up of left shoulder pain limiting mobility. Pain radiates into the arm when getting more severe. Her ankle remains the same amount swollen. No new episode of hand swelling.   Previous HPI 10/06/2021 Rita Watkins is a 68 y.o. 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 She developed pain and swelling involving ankle and in right 2nd finger that was treated with prednisone taper with good response in PCP office. Lab testing positive for RF and elevated uric acid level. She has had joint pain in multiple areas but symptoms significantly increased since earlier this year.  In May she developed severe pain involving the left shoulder and right hand particularly her second finger.  She saw significant swelling in the hands and around the right ankle.  She was treated with a oral prednisone course lasting 6 days and the symptoms stayed improved for a little over 1 month.  She had a recurrence of joint swelling in July that also improved quickly with oral prednisone.  Due to the swelling and warmth around the ankle she is been treated with oral antibiotics that did not  significantly affect her symptoms. 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  Was complicated by ultimately developing sepsis requiring hospital visit and prolonged nursing care treatment course.   Labs reviewed 06/2021 ANA 1:80 cytoplasmic reticular/AMA RF 40 Uric acid 8.3 ESR 33 CPR <1   Review of Systems  Constitutional:  Negative for fatigue.  HENT:  Negative for mouth sores and mouth dryness.   Eyes:  Negative for dryness.  Respiratory:  Negative for shortness of breath.   Cardiovascular:  Negative for chest pain and palpitations.  Gastrointestinal:  Negative for blood in stool, constipation and diarrhea.  Endocrine: Negative for increased urination.  Genitourinary:  Negative for involuntary urination.  Musculoskeletal:  Positive for joint  pain, gait problem, joint pain, myalgias and myalgias. Negative for joint swelling, muscle weakness, morning stiffness and muscle tenderness.  Skin:  Positive for sensitivity to sunlight. Negative for color change, rash and hair loss.  Allergic/Immunologic: Negative for susceptible to infections.  Neurological:  Negative for dizziness and headaches.  Hematological:  Negative for swollen glands.  Psychiatric/Behavioral:  Negative for depressed mood and sleep disturbance. The patient is not nervous/anxious.     PMFS History:  Patient Active Problem List   Diagnosis Date Noted   History of DVT (deep vein thrombosis) 05/15/2023   Locking finger joint 09/18/2022   High risk medication use 03/16/2022   Seropositive rheumatoid arthritis (HCC) 10/06/2021   Sedimentation rate elevation 10/06/2021   Ankle swelling, right 10/06/2021   Cardiac murmur 04/20/2020   Obesity (BMI 30.0-34.9) 04/20/2020   History of pulmonary embolism 04/20/2020   Essential hypertension  04/20/2020   Allergy    Anxiety    COPD (chronic obstructive pulmonary disease) (HCC)    Depression    Hyperlipidemia    Hypertension    Anemia associated with acute blood loss 08/22/2016   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   Peripheral vascular disease (HCC) 08/19/2016   Deep vein thrombosis (DVT) of popliteal vein of both lower extremities (HCC) 08/18/2016   Pulmonary embolism without acute cor pulmonale (HCC) 08/18/2016   Tobacco use 08/18/2016   Open leg wound, left, sequela 08/17/2016   Surgical wound, non healing 08/16/2016   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  CT abdomen 7/17:   1.  Large left retroperitoneal hematoma with mixed density blood products.  Assessment for active bleeding is not possible without IV contrast. 2.  Contrast in the renal collecting systems presumably from contrast enhanced studies performed on August 20, 2016 consistent with known impaired renal function.   CT abdomen 7/27:    Anxiety    Benign essential HTN 08/14/2016   Cardiac murmur 04/20/2020   Cigarette smoker 07/15/2014   COPD (chronic obstructive pulmonary disease) (HCC)    COPD (chronic obstructive pulmonary disease) (HCC)    Deep vein thrombosis (DVT) of popliteal vein of both lower extremities (HCC) 08/18/2016   Last Assessment & Plan:  Formatting of this note might be different from the original. Not clear if provoked/unprovoked (pt had trauma to left toe back in 03/2016 with some decreased mobility thereafter) CT chest confirmed acute PE and also  concerning for some bilateral upper lobe groundglass opacities Hematology on board and heparin drip started but  discontinued 7/17  2/2 large retroperitoneal he   Depression    Depression with anxiety 09/13/2012   Dizziness 07/19/2016   Essential hypertension 04/20/2020   History of pulmonary embolism 04/20/2020   Hyperlipidemia    Hypertension    Leukocytosis 07/30/2014   Lymphangitis 06/07/2016   Mycobacterial infection 06/07/2016   Mycobacterial infection 06/07/2016   Mycobacterium infection, 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 Per dermatology: Please provide the patient  with our outpatient clinic phone number [phone]) to call and schedule a follow up appointment for continued management.   Last Assessment & Plan:  Formatting of this note might be different from the original. Uncertain etiology Vasculitic vs infectious vs malignancy Extensive work up has   Obesity 09/20/2016   Last Assessment & Plan:  Formatting of this note might be different from the original. Body mass index is 22.67 kg/m.  - For weight loss counseling.   Obesity (BMI 30.0-34.9) 04/20/2020   Open leg wound, left, sequela 08/17/2016   Formatting of this note might be different from the original. - Chronic left wound starting in April 2018 and worsening despite antibiotics and debridement -Patient had multiple hospitalizations and was treated with steroids and antibiotics with worsening/extension of the wound - ESR 103, CRP 168 - ANA, factor V Leyden, Antithrombin III, Cardiolite and antibody IgG, C3 and C4 complement proteinase   Peripheral vascular disease (HCC) 08/19/2016   Last Assessment & Plan:  Formatting of this note might be different from the original. Bilateral popliteal DVT seen on admission Occlusion of the left common iliac with collateral blood flow to common  femoral artery LLE angiogram on 08/22/16 for attempt at revascularization to optimize blood flow for healing Left AKA 7/24 I&D 7/31 and 8/2 with further procedures planned.  ASA 81 mg daily PT consult   Pulmonary embolism without acute cor pulmonale (HCC) 08/18/2016   Last Assessment & Plan:  Formatting of this note might be different from the original. 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 Added automatically from request for surgery [id number]  Last Assessment & Plan:  Formatting of this note might be different from the original. Presented with large area of necrotic tissue LLE Bilateral popliteal DVT Etiology uncertain:  Ischemic vs vasculitis vs autoimmune vs possible occult malignancy Bx: leukocytoblastic vasculopathy  In   Tobacco use 08/18/2016   Last Assessment & Plan:  Formatting of this note might be different from the original. Patient has a 30+ pack year history of smoking Discussed low dose CT chest for cancer screening at admission, patient open to this but concerned about extra cost as she has no insurance at this time. No obvious suspicious lesions noted on CTA PE study.  Counseled regarding tobacco cessation    Family History  Problem Relation Age of Onset   Hypertension Mother    Hyperlipidemia Mother    Osteoarthritis Mother    Rheum arthritis Mother    Arthritis Mother    Hypertension Brother    Asthma Brother    Osteoarthritis Brother    Obesity Brother    Past Surgical History:  Procedure Laterality Date   FOOT SURGERY     Left   HIP PINNING     3 each hip   IRRIGATION AND DEBRIDEMENT ABSCESS N/A 06/27/2016    Procedure: IRRIGATION AND DEBRIDEMENT LEFT LEG WITH SOTFT TISSUE BIOPSY;  Surgeon: Adalberto Acton, MD;  Location: [facility];  Service: General;  Laterality: N/A;   LEG AMPUTATION Left    LYMPH NODE BIOPSY Left 06/09/2016   Procedure: BIOPSY LEFT LEG;  Surgeon: Gilles Lacks  S, DO;  Location: [facility];  Service: Plastics;  Laterality: Left;   SLIPPED CAPITAL FEMORAL EPIPHYSIS PINNING     teeth removal  03/2023   Social History   Social History Narrative   Does not exercise.   Immunization History  Administered Date(s) Administered   Fluad Quad(high Dose 65+) 12/18/2018, 11/01/2020, 11/03/2021   Fluad Trivalent(High Dose 65+) 10/17/2022   Influenza Split 11/20/2013   Influenza Whole 11/10/2012   Influenza, High Dose Seasonal PF 11/29/2015   Influenza,inj,Quad PF,6+ Mos 10/29/2014, 11/15/2017   PFIZER Comirnaty(Gray Top)Covid-19 Tri-Sucrose Vaccine 05/19/2020   PFIZER(Purple Top)SARS-COV-2 Vaccination 04/20/2019, 05/10/2019   PNEUMOCOCCAL CONJUGATE-20 03/26/2023   Pfizer Covid-19 Vaccine Bivalent Booster 60yrs & up 11/01/2020   Pfizer(Comirnaty)Fall Seasonal Vaccine 12 years and older 12/07/2021   Pneumococcal Conjugate-13 12/18/2018   Pneumococcal Polysaccharide-23 09/14/2011   Tdap 04/09/2008     Objective: Vital Signs: BP 138/78 (BP Location: Right Wrist, Patient Position: Sitting, Cuff Size: Normal)   Pulse (!) 46   Resp 14   Ht 5\' 9"  (1.753 m)   BMI 33.97 kg/m    Physical Exam Constitutional:      Comments: In wheelchair  Eyes:     Conjunctiva/sclera: Conjunctivae normal.  Cardiovascular:     Rate and Rhythm: Normal rate and regular rhythm.  Pulmonary:     Effort: Pulmonary effort is normal.     Breath sounds: Normal breath sounds.  Lymphadenopathy:     Cervical: No cervical adenopathy.  Skin:    General: Skin is warm and dry.     Comments: Right lower leg pitting edema  Neurological:     Mental Status: She is alert.  Psychiatric:        Mood and Affect:  Mood normal.      Musculoskeletal Exam:  Shoulders full ROM, right shoulder pain with movement especially overhead Elbows full ROM no tenderness or swelling Wrists full ROM no tenderness or swelling Fingers full ROM 1st CMC joint squaring, no tenderness or swelling Left leg AKA  Investigation: No additional findings.  Imaging: No results found.  Recent Labs: Lab Results  Component Value Date   WBC 5.1 05/09/2023   HGB 12.0 05/09/2023   PLT 208 05/09/2023   NA 140 05/09/2023   K 4.4 05/09/2023   CL 105 05/09/2023   CO2 28 05/09/2023   GLUCOSE 83 05/09/2023   BUN 15 05/09/2023   CREATININE 1.07 (H) 05/09/2023   BILITOT 0.5 05/09/2023   ALKPHOS 97 03/26/2023   AST 20 05/09/2023   ALT 19 05/09/2023   PROT 6.4 05/09/2023   ALBUMIN 3.9 03/26/2023   CALCIUM 8.3 (L) 05/09/2023   GFRAA 72 08/05/2019    Speciality Comments: No specialty comments available.  Procedures:  No procedures performed Allergies: Avelox [moxifloxacin hcl in nacl], Montelukast sodium, and Pravastatin   Assessment / Plan:     Visit Diagnoses: Seropositive rheumatoid arthritis (HCC) - Plan: Sedimentation rate, methotrexate (RHEUMATREX) 2.5 MG tablet Rheumatoid arthritis with shoulder involvement responding well to methotrexate. Sedimentation rate decreased from 106 to 43, indicating significant improvement. Minimal symptoms reported, no additional medication needed. - Recheck sedimentation rate to monitor rheumatoid arthritis activity. - Continue methotrexate 15 mg PO weekly and folic acid 1 mg daily  High risk medication use - methotrexate 15 mg p.o. weekly and folic acid 1 mg daily. - Plan: CBC with Differential/Platelet, Comprehensive metabolic panel with GFR No serious interval infection. No intolerance reported with methotrexate doses. - Checking CBC and CMP for medication monitoring  Erythema  Ab Igni Erythema ab igne likely due to prolonged space heater exposure. Adjusted heater to rotate to  prevent further skin changes. - Advise to maintain space heater on rotation to avoid direct heat exposure.        Orders: Orders Placed This Encounter  Procedures   Sedimentation rate   CBC with Differential/Platelet   Comprehensive metabolic panel with GFR   Meds ordered this encounter  Medications   methotrexate (RHEUMATREX) 2.5 MG tablet    Sig: Take 6 tablets (15 mg total) by mouth once a week. Caution: Chemotherapy. Protect from light.    Dispense:  72 tablet    Refill:  0     Follow-Up Instructions: Return in about 3 months (around 08/08/2023) for Ra on MTX f/u 3mos.   [name] Song, MD  Note - This record has been created using [employer].  Chart creation errors have been sought, but may not always  have been located. Such creation errors do not reflect on  the standard of medical care.

## 2023-05-01 ENCOUNTER — Ambulatory Visit (INDEPENDENT_AMBULATORY_CARE_PROVIDER_SITE_OTHER)

## 2023-05-01 DIAGNOSIS — Z7901 Long term (current) use of anticoagulants: Secondary | ICD-10-CM

## 2023-05-01 LAB — POCT INR: INR: 1.4 — AB (ref 2.0–3.0)

## 2023-05-01 NOTE — Patient Instructions (Addendum)
 Pre visit review using our clinic review tool, if applicable. No additional management support is needed unless otherwise documented below in the visit note.  Increase dose today to take 1 tablet and then continue 1/2 tablet daily except take 1 tablet on Fridays. Recheck in 2 weeks. Contact the coumadin clinic number at [phone], if any changes or any questions.

## 2023-05-01 NOTE — Progress Notes (Signed)
 Pt denies any changes. Increase dose today to take 1 tablet and then continue 1/2 tablet daily except take 1 tablet on Fridays. Recheck in 2 weeks. Contact the coumadin clinic number at [phone], if any changes or any questions.

## 2023-05-09 ENCOUNTER — Encounter: Payer: Self-pay | Admitting: Internal Medicine

## 2023-05-09 ENCOUNTER — Ambulatory Visit: Payer: Self-pay | Attending: Internal Medicine | Admitting: Internal Medicine

## 2023-05-09 VITALS — BP 138/78 | HR 46 | Resp 14 | Ht 69.0 in

## 2023-05-09 DIAGNOSIS — M24849 Other specific joint derangements of unspecified hand, not elsewhere classified: Secondary | ICD-10-CM

## 2023-05-09 DIAGNOSIS — M059 Rheumatoid arthritis with rheumatoid factor, unspecified: Secondary | ICD-10-CM

## 2023-05-09 DIAGNOSIS — Z79899 Other long term (current) drug therapy: Secondary | ICD-10-CM

## 2023-05-09 MED ORDER — METHOTREXATE SODIUM 2.5 MG PO TABS
15.0000 mg | ORAL_TABLET | ORAL | 0 refills | Status: DC
Start: 2023-05-09 — End: 2023-08-15

## 2023-05-09 NOTE — Patient Instructions (Signed)
 Secondary Shoulder Impingement Syndrome Rehab Ask your health care provider which exercises are safe for you. Do exercises exactly as told by your provider and adjust them as told. It is normal to feel mild stretching, pulling, tightness, or discomfort as you do these exercises. Stop right away if you feel sudden pain or your pain gets worse. Do not begin these exercises until told by your provider. Stretching and range-of-motion exercise This exercise warms up your muscles and joints. It also improves the movement and flexibility of your neck and shoulder. This exercise also helps to relieve pain and stiffness. Cervical side bend  Using good posture, sit on a stable chair or stand up. Without moving your shoulders, slowly tilt your left / right ear toward your left / right shoulder until you feel a stretch in your neck (cervical) muscles on the other side. You should be looking straight ahead. Hold for __________ seconds. Slowly return to the starting position. Repeat the stretch on your left / right side. Repeat __________ times. Complete this exercise __________ times a day. Strengthening exercises These exercises build strength and endurance in your shoulder. Endurance is the ability to use your muscles for a long time, even after they get tired. Scapular retraction, isotonic  Sit in a stable chair without armrests or stand up. Secure an exercise band to a stable object in front of you so the band is at shoulder height. Hold one end of the exercise band in each hand. Squeeze your shoulder blades (scapulae) together and move your elbows slightly behind you (retraction). Do not shrug your shoulders upward while you do this. Hold for __________ seconds. Slowly return to the starting position. Repeat __________ times. Complete this exercise __________ times a day. Shoulder extension with scapular retraction, isotonic  Sit in a stable chair without armrests or stand up. Secure an exercise  band to a stable object in front of you so the band is above shoulder height. Hold one end of the exercise band in each hand. Straighten your elbows and lift your hands up to shoulder height. Squeeze your shoulder blades together (scapular retraction) and pull your hands down to the sides of your thighs (shoulder extension). Stop when your hands are straight down by your sides. Do not let your hands go behind your body. Hold for __________ seconds. Slowly return to the starting position. Repeat __________ times. Complete this exercise __________ times a day. Shoulder abduction, isotonic  Sit in a stable chair without armrests or stand up. If told, hold a __________ weight in your left / right hand. Start with your arms straight down. Turn your left / right hand so your palm faces in, toward your body. Slowly lift your left / right hand out to your side (abduction). Do not lift your hand above shoulder height. Keep your arms straight. Avoid shrugging your shoulder while you do this movement. Keep your shoulder blade tucked down toward the middle of your back. Hold for __________ seconds. Slowly lower your arm, and return to the starting position. Repeat __________ times. Complete this exercise __________ times a day.

## 2023-05-10 LAB — CBC WITH DIFFERENTIAL/PLATELET
Absolute Lymphocytes: 1132 {cells}/uL (ref 850–3900)
Absolute Monocytes: 683 {cells}/uL (ref 200–950)
Basophils Absolute: 20 {cells}/uL (ref 0–200)
Basophils Relative: 0.4 %
Eosinophils Absolute: 133 {cells}/uL (ref 15–500)
Eosinophils Relative: 2.6 %
HCT: 36.8 % (ref 35.0–45.0)
Hemoglobin: 12 g/dL (ref 11.7–15.5)
MCH: 31.3 pg (ref 27.0–33.0)
MCHC: 32.6 g/dL (ref 32.0–36.0)
MCV: 96.1 fL (ref 80.0–100.0)
MPV: 10.1 fL (ref 7.5–12.5)
Monocytes Relative: 13.4 %
Neutro Abs: 3131 {cells}/uL (ref 1500–7800)
Neutrophils Relative %: 61.4 %
Platelets: 208 10*3/uL (ref 140–400)
RBC: 3.83 10*6/uL (ref 3.80–5.10)
RDW: 13.9 % (ref 11.0–15.0)
Total Lymphocyte: 22.2 %
WBC: 5.1 10*3/uL (ref 3.8–10.8)

## 2023-05-10 LAB — COMPREHENSIVE METABOLIC PANEL WITH GFR
AG Ratio: 1.4 (calc) (ref 1.0–2.5)
ALT: 19 U/L (ref 6–29)
AST: 20 U/L (ref 10–35)
Albumin: 3.7 g/dL (ref 3.6–5.1)
Alkaline phosphatase (APISO): 92 U/L (ref 37–153)
BUN/Creatinine Ratio: 14 (calc) (ref 6–22)
BUN: 15 mg/dL (ref 7–25)
CO2: 28 mmol/L (ref 20–32)
Calcium: 8.3 mg/dL — ABNORMAL LOW (ref 8.6–10.4)
Chloride: 105 mmol/L (ref 98–110)
Creat: 1.07 mg/dL — ABNORMAL HIGH (ref 0.50–1.05)
Globulin: 2.7 g/dL (ref 1.9–3.7)
Glucose, Bld: 83 mg/dL (ref 65–99)
Potassium: 4.4 mmol/L (ref 3.5–5.3)
Sodium: 140 mmol/L (ref 135–146)
Total Bilirubin: 0.5 mg/dL (ref 0.2–1.2)
Total Protein: 6.4 g/dL (ref 6.1–8.1)
eGFR: 56 mL/min/{1.73_m2} — ABNORMAL LOW (ref >=60)

## 2023-05-10 LAB — SEDIMENTATION RATE: Sed Rate: 45 mm/h — ABNORMAL HIGH (ref 0–30)

## 2023-05-10 NOTE — Progress Notes (Signed)
 Sed rate remains the same slightly elevated at 45.  Kidney function is stable with estimated GFR 56.  Liver enzymes are normal.  Blood counts are normal.

## 2023-05-15 ENCOUNTER — Ambulatory Visit: Payer: Medicare HMO | Attending: Cardiology | Admitting: Cardiology

## 2023-05-15 ENCOUNTER — Encounter: Payer: Self-pay | Admitting: Cardiology

## 2023-05-15 ENCOUNTER — Ambulatory Visit (INDEPENDENT_AMBULATORY_CARE_PROVIDER_SITE_OTHER)

## 2023-05-15 VITALS — BP 124/72 | HR 51 | Ht 69.0 in | Wt 250.0 lb

## 2023-05-15 DIAGNOSIS — Z86718 Personal history of other venous thrombosis and embolism: Secondary | ICD-10-CM

## 2023-05-15 DIAGNOSIS — Z86711 Personal history of pulmonary embolism: Secondary | ICD-10-CM

## 2023-05-15 DIAGNOSIS — E785 Hyperlipidemia, unspecified: Secondary | ICD-10-CM

## 2023-05-15 DIAGNOSIS — Z7901 Long term (current) use of anticoagulants: Secondary | ICD-10-CM

## 2023-05-15 DIAGNOSIS — I10 Essential (primary) hypertension: Secondary | ICD-10-CM

## 2023-05-15 HISTORY — DX: Personal history of other venous thrombosis and embolism: Z86.718

## 2023-05-15 LAB — POCT INR: INR: 2.4 (ref 2.0–3.0)

## 2023-05-15 NOTE — Patient Instructions (Addendum)
 Pre visit review using our clinic review tool, if applicable. No additional management support is needed unless otherwise documented below in the visit note.  Continue 1/2 tablet daily except take 1 tablet on Fridays. Recheck in 4 weeks. Contact the coumadin clinic number at [phone], if any changes or any questions.

## 2023-05-15 NOTE — Patient Instructions (Addendum)
 Medication Instructions:  Your physician recommends that you continue on your current medications as directed. Please refer to the Current Medication list given to you today.  *If you need a refill on your cardiac medications before your next appointment, please call your pharmacy*   Lab Work: 3rd Floor   [address]  Your physician recommends that you return for lab work in:   2 weeks fasting You need to have labs done when you are fasting.  You can come Monday through Friday 8:00 am to 11:30AM and 1:00 to 4:00. You do not need to make an appointment as the order has already been placed.   Testing/Procedures: We will order CT coronary calcium score. It will cost $99.00 and is not covered by insurance.  Please call to schedule.     [facility] [address] [place], [place] [address] [phone]     Follow-Up: At [facility], you and your health needs are our priority.  As part of our continuing mission to provide you with exceptional heart care, we have created designated Provider Care Teams.  These Care Teams include your primary Cardiologist (physician) and Advanced Practice Providers (APPs -  Physician Assistants and Nurse Practitioners) who all work together to provide you with the care you need, when you need it.  We recommend signing up for the patient portal called "MyChart".  Sign up information is provided on this After Visit Summary.  MyChart is used to connect with patients for Virtual Visits (Telemedicine).  Patients are able to view lab/test results, encounter notes, upcoming appointments, etc.  Non-urgent messages can be sent to your provider as well.   To learn more about what you can do with MyChart, go to [email or web address].    Your next appointment:   3 month(s)  The format for your next appointment:   In Person  Provider:   Gypsy Balsam, MD    Other Instructions NA

## 2023-05-15 NOTE — Progress Notes (Signed)
 Cardiology Office Note:    Date:  05/15/2023   ID:  Rita Watkins, DOB [birth year], MRN [id number]  PCP:  Esperanza Richters, PA-C  Cardiologist:  Gypsy Balsam, MD    Referring MD: Esperanza Richters, [place]   Chief Complaint  Patient presents with   Hyperlipidemia    History of Present Illness:    Rita PERI is a 68 y.o. female quite interesting past medical history.  She does have hypertension history of smoking but she quit smoking 2018, dyslipidemia in 2019 she sustained some injury to her left foot that happened when she was at the beach in [facility] started having some wound that did not heal and of having initially amputation of her toes after that she is to suffer from DVT and pulmonary emboli.  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  She was put on anticoagulation with Coumadin since that time.  There was consideration about switching her to Eliquis or Xarelto however price has been prohibitive.  She was sent to me because of issues with her dyslipidemia.  She did see my partner 2 years ago echocardiograms been done which was unrevealing.  Overall she said she move around on the wheelchair she does have prosthesis but absolutely hated she cannot walk with prosthesis without using walker so she is very mobile using wheelchair.  Denies have any chest pain tightness squeezing pressure in the chest.  Past Medical History:  Diagnosis Date   Allergy    Anemia associated with acute blood loss 08/22/2016   Formatting of this note is different from the original.  CT abdomen 7/17:   1.  Large left retroperitoneal hematoma with mixed density blood products.  Assessment for active bleeding is not possible without IV contrast. 2.  Contrast in the renal collecting systems presumably from contrast enhanced studies performed on August 20, 2016 consistent with known  impaired renal function.   CT abdomen 7/27:    Anxiety    Benign essential HTN 08/14/2016   Cardiac murmur 04/20/2020   Cigarette smoker 07/15/2014   COPD (chronic obstructive pulmonary disease) (HCC)    COPD (chronic obstructive pulmonary disease) (HCC)    Deep vein thrombosis (DVT) of popliteal vein of both lower extremities (HCC) 08/18/2016   Last Assessment & Plan:  Formatting of this note might be different from the original. Not clear if provoked/unprovoked (pt had trauma to left toe back in 03/2016 with some decreased mobility thereafter) CT chest confirmed acute PE and also concerning for some bilateral upper lobe groundglass opacities Hematology on board and heparin drip started but  discontinued 7/17  2/2 large retroperitoneal he   Depression    Depression with anxiety 09/13/2012   Dizziness 07/19/2016   Essential hypertension 04/20/2020   History of pulmonary embolism 04/20/2020   Hyperlipidemia    Hypertension    Leukocytosis 07/30/2014   Lymphangitis 06/07/2016   Mycobacterial infection 06/07/2016   Mycobacterial infection 06/07/2016   Mycobacterium infection, 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 [facility]: Please provide the patient with our outpatient clinic phone number [phone]) to call and schedule a follow up appointment for continued management.   Last Assessment & Plan:  Formatting of this note might be different from the original. Uncertain etiology Vasculitic vs infectious vs  malignancy Extensive work up has   Obesity 09/20/2016   Last Assessment & Plan:  Formatting of this note might be different from the original. Body mass index is 22.67 kg/m.  - For weight loss counseling.   Obesity (BMI 30.0-34.9) 04/20/2020   Open leg wound, left, sequela 08/17/2016   Formatting of this note might be different from the original.  - Chronic left wound starting in April 2018 and worsening despite antibiotics and debridement -Patient had multiple hospitalizations and was treated with steroids and antibiotics with worsening/extension of the wound - ESR 103, CRP 168 - ANA, factor V Leyden, Antithrombin III, Cardiolite and antibody IgG, C3 and C4 complement proteinase   Peripheral vascular disease (HCC) 08/19/2016   Last Assessment & Plan:  Formatting of this note might be different from the original. 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  ASA 81 mg daily PT consult   Pulmonary embolism without acute cor pulmonale (HCC) 08/18/2016   Last Assessment & Plan:  Formatting of this note might be different from the original. 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 Added automatically from request for surgery [id number]  Last Assessment & Plan:  Formatting of this note might be different from the original. Presented with large area of necrotic tissue LLE Bilateral popliteal DVT Etiology uncertain:  Ischemic vs vasculitis vs autoimmune vs possible occult malignancy Bx: leukocytoblastic vasculopathy  In   Tobacco use 08/18/2016   Last Assessment & Plan:  Formatting of this note might be different from the original. Patient has a 30+ pack year history of smoking Discussed low dose CT chest for cancer screening at admission, patient open to this but  concerned about extra cost as she has no insurance at this time. No obvious suspicious lesions noted on CTA PE study.  Counseled regarding tobacco cessation    Past Surgical History:  Procedure Laterality Date   FOOT SURGERY     Left   HIP PINNING     3 each hip   IRRIGATION AND DEBRIDEMENT ABSCESS N/A 06/27/2016   Procedure: IRRIGATION AND DEBRIDEMENT LEFT LEG WITH SOTFT TISSUE BIOPSY;  Surgeon: Berna Bue, MD;  Location: [facility];  Service: General;  Laterality: N/A;   LEG AMPUTATION Left    LYMPH NODE BIOPSY Left 06/09/2016   Procedure: BIOPSY LEFT LEG;  Surgeon: Peggye Form, DO;  Location: [facility];  Service: Plastics;  Laterality: Left;   SLIPPED CAPITAL FEMORAL EPIPHYSIS PINNING     teeth removal  03/2023    Current Medications: Current Meds  Medication Sig   albuterol (VENTOLIN HFA) 108 (90 Base) MCG/ACT inhaler Inhale 2 puffs into the lungs every 6 (six) hours as needed. (Patient taking differently: Inhale 2 puffs into the lungs every 6 (six) hours as needed for wheezing or shortness of breath.)   ALPRAZolam (XANAX) 0.5 MG tablet TAKE 1 TABLET BY MOUTH 2 TIMES DAILY AS NEEDED  FOR ANXIETY.   amLODipine (NORVASC) 2.5 MG tablet Take 1 tablet (2.5 mg total) by mouth daily.   benazepril (LOTENSIN) 20 MG tablet Take 1 tablet (20 mg total) by mouth 2 (two) times daily.   ezetimibe (ZETIA) 10 MG tablet Take 1 tablet (10 mg total) by mouth daily.   folic acid (FOLVITE) 1 MG tablet Take 1 tablet (1 mg total) by mouth daily. (While on methotrexate therapy)   gabapentin (NEURONTIN) 300 MG capsule Take 1 capsule (300 mg total) by mouth at bedtime.   methotrexate (RHEUMATREX) 2.5 MG tablet Take 6 tablets (15 mg total) by mouth once a week. Caution: Chemotherapy. Protect from light.   warfarin (COUMADIN) 5 MG tablet TAKE 1/2 TABLET BY MOUTH DAILY EXCEPT TAKE 1 TABLET ON MONDAY, WEDNESDAY AND FRIDAY OR AS DIRECTED BY ANTICOAGULATION CLINIC (Patient taking differently: Take 5 mg by  mouth See admin instructions. TAKE 1/2 TABLET BY MOUTH DAILY EXCEPT TAKE 1 TABLET ON MONDAY, WEDNESDAY AND FRIDAY OR AS DIRECTED BY ANTICOAGULATION CLINIC)     Allergies:   Avelox [moxifloxacin hcl in nacl], Montelukast sodium, and Pravastatin   Social History   Socioeconomic History   Marital status: Widowed    Spouse name: Not on file   Number of children: Not on file   Years of education: high schoo   Highest education level: 12th grade  Occupational History   Occupation: ADM. ASSISTANT    Employer: [employer]  Tobacco Use   Smoking status: Former    Current packs/day: 0.00    Average packs/day: 1 pack/day for 35.0 years (35.0 ttl pk-yrs)    Types: Cigarettes    Start date: 09/14/1981    Quit date: 09/14/2016    Years since quitting: 6.6    Passive exposure: Past   Smokeless tobacco: Never  Vaping Use   Vaping status: Never Used  Substance and Sexual Activity   Alcohol use: No    Alcohol/week: 0.0 standard drinks of alcohol   Drug use: No   Sexual activity: Not Currently    Birth control/protection: Post-menopausal  Other Topics Concern   Not on file  Social History Narrative   Does not exercise.   Social Drivers of Corporate investment banker Strain: Low Risk  (03/19/2023)   Overall Financial Resource Strain (CARDIA)    Difficulty of Paying Living Expenses: Not very hard  Food Insecurity: Food Insecurity Present (03/19/2023)   Hunger Vital Sign    Worried About Running Out of Food in the Last Year: Sometimes true    Ran Out of Food in the Last Year: Never true  Transportation Needs: No Transportation Needs (03/19/2023)   PRAPARE - Administrator, Civil Service (Medical): No    Lack of Transportation (Non-Medical): No  Physical Activity: Inactive (03/19/2023)   Exercise Vital Sign    Days of Exercise [facility] Week: 0 days    Minutes of Exercise [facility] Session: 10 min  Stress: Stress Concern Present (03/19/2023)   [employer] of Occupational Health -  Occupational Stress Questionnaire    Feeling of Stress : To some extent  Social Connections: Moderately Integrated (03/19/2023)   Social Connection and Isolation Panel [NHANES]    Frequency of Communication with Friends and Family: More than three times a week    Frequency of Social Gatherings with Friends and Family: Once a week    Attends Religious Services: More than 4 times [facility] year    Active Member of [employer] or Organizations: Yes  Attends Banker Meetings: More than 4 times Watkins year    Marital Status: Widowed     Family History: The patient's family history includes Arthritis in her mother; Asthma in her brother; Hyperlipidemia in her mother; Hypertension in her brother and mother; Obesity in her brother; Osteoarthritis in her brother and mother; Rheum arthritis in her mother. ROS:   Please see the history of present illness.    All 14 point review of systems negative except as described [facility] history of present illness  EKGs/Labs/Other Studies Reviewed:         Recent Labs: 05/09/2023: ALT 19; BUN 15; Creat 1.07; Hemoglobin 12.0; Platelets 208; Potassium 4.4; Sodium 140  Recent Lipid Panel    Component Value Date/Time   CHOL 196 03/26/2023 1244   CHOL 247 (H) 08/05/2019 0000   TRIG 201.0 (H) 03/26/2023 1244   HDL 37.20 (L) 03/26/2023 1244   HDL 34 (L) 08/05/2019 0000   CHOLHDL 5 03/26/2023 1244   VLDL 40.2 (H) 03/26/2023 1244   LDLCALC 119 (H) 03/26/2023 1244   LDLCALC 176 (H) 08/05/2019 0000   LDLDIRECT 165.0 03/03/2020 1141    Physical Exam:    VS:  BP 124/72 (BP Location: Right Arm, Patient Position: Sitting)   Pulse (!) 51   Ht 5\' 9"  (1.753 m)   Wt 250 lb (113.4 kg)   SpO2 97%   BMI 36.92 kg/m     Wt Readings from Last 3 Encounters:  05/15/23 250 lb (113.4 kg)  03/26/23 230 lb (104.3 kg)  12/19/22 225 lb (102.1 kg)     GEN:  Well nourished, well developed in no acute distress HEENT: Normal NECK: No JVD; No carotid bruits LYMPHATICS: No  lymphadenopathy CARDIAC: RRR, no murmurs, no rubs, no gallops RESPIRATORY:  Clear to auscultation without rales, wheezing or rhonchi  ABDOMEN: Soft, non-tender, non-distended MUSCULOSKELETAL:  No edema; No deformity  SKIN: Warm and dry LOWER EXTREMITIES: no swelling NEUROLOGIC:  Alert and oriented x 3 PSYCHIATRIC:  Normal affect   ASSESSMENT:    1. Hypertension, unspecified type   2. Hyperlipidemia, unspecified hyperlipidemia type   3. Benign essential HTN   4. History of DVT (deep vein thrombosis)   5. History of pulmonary embolism    PLAN:    In order of problems listed above:  Hypertension, blood pressure well-controlled continue present management. Dyslipidemia I did review K PN which show me LDL of 119 HDL 37, she started with Zetia since that time.  Will wait for results of fasting lipid profile to make a decision how aggressive we need to be I will also ask her to have a fasting lipid profile done in about 6 weeks. Need for long-term anticoagulation I did review record the way I see this that that was a DVT that led to PE provoked by her ongoing wound infection in the leg however it is extremely difficult decision about deciding if he can discontinue anticoagulation I will refer her to our hematologist colleague for assessment of his complex situation   Medication Adjustments/Labs and Tests Ordered: Current medicines are reviewed at length with the patient today.  Concerns regarding medicines are outlined above.  Orders Placed This Encounter  Procedures   CT CARDIAC SCORING (SELF PAY ONLY)   ALT   AST   Lipid panel   EKG 12-Lead   Medication changes: No orders of the defined types were placed in this encounter.   Signed, Georgeanna Lea, MD, [facility] 05/15/2023 5:28 PM  [facility]

## 2023-05-15 NOTE — Progress Notes (Cosign Needed Addendum)
 Continue 1/2 tablet daily except take 1 tablet on Fridays. Recheck in 4 weeks. Contact the coumadin clinic number at [phone], if any changes or any questions.   Medical screening examination/treatment/procedure(s) were performed by non-physician practitioner and as supervising physician I was immediately available for consultation/collaboration.  I agree with above. Jacinta Shoe, MD

## 2023-05-16 NOTE — Addendum Note (Signed)
 Addended by: Baldo Ash D on: 05/16/2023 04:04 PM   Modules accepted: Orders

## 2023-05-17 ENCOUNTER — Other Ambulatory Visit: Payer: Self-pay | Admitting: Medical

## 2023-05-28 ENCOUNTER — Inpatient Hospital Stay (HOSPITAL_BASED_OUTPATIENT_CLINIC_OR_DEPARTMENT_OTHER): Admitting: Medical Oncology

## 2023-05-28 ENCOUNTER — Inpatient Hospital Stay: Attending: Medical Oncology

## 2023-05-28 ENCOUNTER — Encounter: Payer: Self-pay | Admitting: Medical Oncology

## 2023-05-28 VITALS — BP 151/56 | HR 46 | Temp 97.9°F | Resp 17 | Ht 69.0 in | Wt 240.0 lb

## 2023-05-28 DIAGNOSIS — D6859 Other primary thrombophilia: Secondary | ICD-10-CM

## 2023-05-28 DIAGNOSIS — Z79899 Other long term (current) drug therapy: Secondary | ICD-10-CM | POA: Diagnosis not present

## 2023-05-28 DIAGNOSIS — Z7901 Long term (current) use of anticoagulants: Secondary | ICD-10-CM | POA: Diagnosis not present

## 2023-05-28 DIAGNOSIS — Z87891 Personal history of nicotine dependence: Secondary | ICD-10-CM | POA: Diagnosis not present

## 2023-05-28 DIAGNOSIS — Z86718 Personal history of other venous thrombosis and embolism: Secondary | ICD-10-CM | POA: Diagnosis not present

## 2023-05-28 DIAGNOSIS — Z86711 Personal history of pulmonary embolism: Secondary | ICD-10-CM | POA: Diagnosis not present

## 2023-05-28 DIAGNOSIS — Z79631 Long term (current) use of antimetabolite agent: Secondary | ICD-10-CM | POA: Insufficient documentation

## 2023-05-28 NOTE — Progress Notes (Signed)
 Hematology/Oncology Consultation   Name: Rita Watkins      MRN: [id number]   Date: 05/28/2023 Time:1:40 PM   REFERRING PHYSICIAN: Dr. Ralene Burger- Cardiology  REASON FOR CONSULT: History of DVT, long term use of anticoagulants    DIAGNOSIS: History of bilateral popliteal DVT and bilateral lower lobe pulmonary emboli diagnosed 08/2016.   HISTORY OF PRESENT ILLNESS: [name] is a very pleasant 68 yo caucasian female with history of bilateral popliteal DVT and bilateral lower lobe pulmonary diagnosed in July 2018. She was initially seen by our office in 2022 for this. History below taken from this encounter and confirmed by patient today" "At that time she had been hospitalized with a left leg wound that would not heal. She states that this was felt to be due to a flesh eating bacteria (necrotizing vasculitis) acquired either while she was visiting the beach or with a pedicure.  She had an IVC filter placed and started on a heparin drip. Unfortunately, she developed a retroperitoneal bleed while on heparin and had to transition to Coumadin  which she is still on at this time.  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  She had some hyper coag testing at that time and was noted to have a mildly elevated homocystine level as well as elevated cardiolipin IgM antibody.  She had been a smoker, < 1 ppd, and quit on 08/24/2016.  She is wanting to know if she is committed to lifelong anticoagulation with coumadin  or if she switch to Xarelto  or stop all together.  She has some issues with phantom limb pain and takes Neurontin  which she states has helped.  She has a prosthesis that she no longer wears due to several falls. She now uses a wheel chair most of the time.  No known family history of thrombus, stroke, heart or liver disease.  No personal history of cancer. Family history includes several paternal uncles and one maternal  uncle with lung cancer. All were smokers.  She has had no issues with blood loss on coumadin . No abnormal bruising, no petechiae.  She has chronic swelling in the right ankle and foot which she states improved when she wears her compression stocking or props up her foot.  No tenderness, numbness or tingling in her extremities.  She has occasional hand cramps.  No history of pregnancy or miscarriage.  No history of diabetes or thyroid disease.  She states that she is up to date on her colonoscopy and goes every 10 years.  She is not interested in having a mammogram. She states that she does not like how it feels. She will let us  know if she changes her mind.  She states that she had 2 pins placed in each hip in the 1970's and has had no issues.  No issue with frequent of recurrent infections at this time.  No fever, chills, n/v, cough, rash, dizziness, SOB, chest pain, palpitations, abdominal pain or changes in bowel or bladder habits.  She has maintained a good appetite and is staying well hydrated. Her weight is described as stable.  No ETOH or recreational drug use.  She stays quite sedentary most of the time. "  She is currently on Xarelto . She is tolerating this well. She denies any bleeding episodes or falls. She has had stable lab values with her coumadin  for quite some time aside from a brief period where she was  on Ibuprofen. She gets her values checked every 4-6 weeks at the [facility]. Medication is prescribed by her PCP Sylvia Everts PA-C.     ROS: All other 10 point review of systems is negative.   PAST MEDICAL HISTORY:   Past Medical History:  Diagnosis Date   Allergy    Anemia associated with acute blood loss 08/22/2016   Formatting of this note is different from the original.  CT abdomen 7/17:   1.  Large left retroperitoneal hematoma with mixed density blood products.  Assessment for active bleeding is not possible without IV contrast. 2.  Contrast in the  renal collecting systems presumably from contrast enhanced studies performed on August 20, 2016 consistent with known impaired renal function.   CT abdomen 7/27:    Anxiety    Benign essential HTN 08/14/2016   Cardiac murmur 04/20/2020   Cigarette smoker 07/15/2014   COPD (chronic obstructive pulmonary disease) (HCC)    COPD (chronic obstructive pulmonary disease) (HCC)    Deep vein thrombosis (DVT) of popliteal vein of both lower extremities (HCC) 08/18/2016   Last Assessment & Plan:  Formatting of this note might be different from the original. Not clear if provoked/unprovoked (pt had trauma to left toe back in 03/2016 with some decreased mobility thereafter) CT chest confirmed acute PE and also concerning for some bilateral upper lobe groundglass opacities Hematology on board and heparin drip started but  discontinued 7/17  2/2 large retroperitoneal he   Depression    Depression with anxiety 09/13/2012   Dizziness 07/19/2016   Essential hypertension 04/20/2020   History of pulmonary embolism 04/20/2020   Hyperlipidemia    Hypertension    Leukocytosis 07/30/2014   Lymphangitis 06/07/2016   Mycobacterial infection 06/07/2016   Mycobacterial infection 06/07/2016   Mycobacterium infection, 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 Per dermatology: Please provide the patient with our outpatient clinic phone number [phone]) to call and schedule a follow up appointment for continued management.   Last Assessment & Plan:  Formatting of this note might be different from the original. Uncertain etiology Vasculitic vs infectious vs malignancy Extensive work up has   Obesity 09/20/2016   Last Assessment & Plan:  Formatting of this note might be different from the original. Body mass index is 22.67 kg/m.  - For weight loss counseling.   Obesity (BMI 30.0-34.9)  04/20/2020   Open leg wound, left, sequela 08/17/2016   Formatting of this note might be different from the original. - Chronic left wound starting in April 2018 and worsening despite antibiotics and debridement -Patient had multiple hospitalizations and was treated with steroids and antibiotics with worsening/extension of the wound - ESR 103, CRP 168 - ANA, factor V Leyden, Antithrombin III , Cardiolite and antibody IgG, C3 and C4 complement proteinase   Peripheral vascular disease (HCC) 08/19/2016   Last Assessment & Plan:  Formatting of this note might be different from the original. 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  ASA 81 mg daily PT consult   Pulmonary embolism without acute cor pulmonale (HCC) 08/18/2016   Last Assessment & Plan:  Formatting of this note might  be different from the original. 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 Added automatically from request for surgery [id number]  Last Assessment & Plan:  Formatting of this note might be different from the original. Presented with large area of necrotic tissue LLE Bilateral popliteal DVT Etiology uncertain:  Ischemic vs vasculitis vs autoimmune vs possible occult malignancy Bx: leukocytoblastic vasculopathy  In   Tobacco use 08/18/2016   Last Assessment & Plan:  Formatting of this note might be different from the original. Patient has a 30+  pack year history of smoking Discussed low dose CT chest for cancer screening at admission, patient open to this but concerned about extra cost as she has no insurance at this time. No obvious suspicious lesions noted on CTA PE study.  Counseled regarding tobacco cessation    ALLERGIES: Allergies  Allergen Reactions   Avelox [Moxifloxacin Hcl In Nacl] Anaphylaxis   Montelukast Sodium Other (See Comments)    Insomnia   Pravastatin Other (See Comments)    Myalgias on 20mg       MEDICATIONS:  Current Outpatient Medications on File Prior to Visit  Medication Sig Dispense Refill   albuterol  (VENTOLIN  HFA) 108 (90 Base) MCG/ACT inhaler Inhale 2 puffs into the lungs every 6 (six) hours as needed. (Patient taking differently: Inhale 2 puffs into the lungs every 6 (six) hours as needed for wheezing or shortness of breath.) 6.7 g 0   ALPRAZolam  (XANAX ) 0.5 MG tablet TAKE 1 TABLET BY MOUTH 2 TIMES DAILY AS NEEDED FOR ANXIETY. 60 tablet 3   amLODipine  (NORVASC ) 2.5 MG tablet TAKE 1 TABLET BY MOUTH EVERY DAY 90 tablet 1   benazepril  (LOTENSIN ) 20 MG tablet Take 1 tablet (20 mg total) by mouth 2 (two) times daily. 180 tablet 3   ezetimibe  (ZETIA ) 10 MG tablet Take 1 tablet (10 mg total) by mouth daily. 90 tablet 3   folic acid  (FOLVITE ) 1 MG tablet Take 1 tablet (1 mg total) by mouth daily. (While on methotrexate  therapy) 90 tablet 0   gabapentin  (NEURONTIN ) 300 MG capsule Take 1 capsule (300 mg total) by mouth at bedtime. 90 capsule 3   methotrexate  (RHEUMATREX) 2.5 MG tablet Take 6 tablets (15 mg total) by mouth once a week. Caution: Chemotherapy. Protect from light. 72 tablet 0   warfarin (COUMADIN ) 5 MG tablet TAKE 1/2 TABLET BY MOUTH DAILY EXCEPT TAKE 1 TABLET ON MONDAY, WEDNESDAY AND FRIDAY OR AS DIRECTED BY ANTICOAGULATION CLINIC (Patient taking differently: Take 5 mg by mouth See admin instructions. TAKE 1/2 TABLET BY MOUTH DAILY EXCEPT TAKE 1 TABLET ON MONDAY, WEDNESDAY AND FRIDAY OR AS DIRECTED BY  ANTICOAGULATION CLINIC) 75 tablet 1   [DISCONTINUED] DULoxetine  (CYMBALTA ) 30 MG capsule Take 1 capsule (30 mg total) by mouth daily. (Patient not taking: Reported on 05/14/2023) 30 capsule 0   No current facility-administered medications on file prior to visit.     PAST SURGICAL HISTORY Past Surgical History:  Procedure Laterality Date   FOOT SURGERY     Left   HIP PINNING     3 each hip   IRRIGATION AND DEBRIDEMENT ABSCESS N/A 06/27/2016  Procedure: IRRIGATION AND DEBRIDEMENT LEFT LEG WITH SOTFT TISSUE BIOPSY;  Surgeon: Adalberto Acton, MD;  Location: [facility];  Service: General;  Laterality: N/A;   LEG AMPUTATION Left    LYMPH NODE BIOPSY Left 06/09/2016   Procedure: BIOPSY LEFT LEG;  Surgeon: Thornell Flirt, DO;  Location: [facility];  Service: Plastics;  Laterality: Left;   SLIPPED CAPITAL FEMORAL EPIPHYSIS PINNING     teeth removal  03/2023    FAMILY HISTORY: Family History  Problem Relation Age of Onset   Hypertension Mother    Hyperlipidemia Mother    Osteoarthritis Mother    Rheum arthritis Mother    Arthritis Mother    Hypertension Brother    Asthma Brother    Osteoarthritis Brother    Obesity Brother     SOCIAL HISTORY:  reports that she quit smoking about 6 years ago. Her smoking use included cigarettes. She started smoking about 41 years ago. She has a 35 pack-year smoking history. She has been exposed to tobacco smoke. She has never used smokeless tobacco. She reports that she does not drink alcohol and does not use drugs.  PERFORMANCE STATUS: The patient's performance status is 2 - Symptomatic, <50% confined to bed  PHYSICAL EXAM: Most Recent Vital Signs: Blood pressure (!) 156/64, pulse (!) 50, temperature 97.9 F (36.6 C), temperature source Oral, resp. rate 19, height 5\' 9"  (1.753 m), weight 240 lb (108.9 kg), SpO2 99%. BP (!) 156/64 (BP Location: Left Arm, Patient Position: Sitting)   Pulse (!) 50   Temp 97.9 F (36.6 C) (Oral)   Resp 19   Ht 5'  9" (1.753 m)   Wt 240 lb (108.9 kg)   SpO2 99%   BMI 35.44 kg/m   General Appearance:    Alert, cooperative, no distress, appears stated age  Head:    Normocephalic, without obvious abnormality, atraumatic  Eyes:    PERRL, conjunctiva/corneas clear, EOM's intact, fundi    benign, both eyes        Throat:   Lips, mucosa, and tongue normal; teeth and gums normal  Neck:   Supple, symmetrical, trachea midline, no adenopathy;    thyroid:  no enlargement/tenderness/nodules; no carotid   bruit or JVD  Back:     Symmetric, no curvature, ROM normal, no CVA tenderness  Lungs:     Clear to auscultation bilaterally, respirations unlabored  Chest Wall:    No tenderness or deformity   Heart:    Regular rate and rhythm, S1 and S2 normal, no murmur, rub   or gallop     Abdomen:     Soft, non-tender, bowel sounds active all four quadrants,    no masses, no organomegaly        Extremities:   Extremities normal, atraumatic, no cyanosis or edema  Pulses:   2+ and symmetric all extremities  Skin:   Skin color, texture, turgor normal, no rashes or lesions  Lymph nodes:   Cervical, supraclavicular, and axillary nodes normal  Neurologic:   CNII-XII intact, normal strength, sensation and reflexes    throughout    LABORATORY DATA:  No results found for this or any previous visit (from the past 48 hours).     RADIOGRAPHY: No results found.     PATHOLOGY: None  Encounter Diagnoses  Name Primary?   Primary hypercoagulable state (HCC) Yes   History of pulmonary embolism    History of DVT (deep vein thrombosis)     ASSESSMENT/PLAN: Ms. 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 She was also smoking at the time. She had no prior history of thrombus and has been on Coumadin  since shortly after diagnosis.   She was switched from  Coumadin  to Xarelto  after a visit with our office back in 2022 however this has been cost prohibitive and she been on the Coumadin  for quite some time now. She also had a hypercoagulable work up which was positive. She had a mildly elevated homocystine, low Protein C level, low Protein S level, positive lupus anticoagulant panel and cardiolipin IgM antibody level at diagnosis. Given her elevated homocysteine level daily folic acid  supplementation was also recommended.   After review of her hypercoagulable labs that were done both upon her initial hospitalization and by our office along with her history of DVT/PE and sedentary lifestyle secondary to AKA I agree with continuation of anticoagulation therapy. At this time she elects to continue her Coumadin  as it is free and she has had stable values without bleeding of falls.   As this is being managed by her PCP and the Coumadin  Clinic she will follow up with our office as needed.    All questions were answered and she is in agreement with the plan. She was encouraged to contact our office with any questions or concerns. We can certainly see the patient sooner if necessary.   Sharla Davis, PA-C

## 2023-06-01 NOTE — Telephone Encounter (Signed)
 Talked with pt. She is going to continue coumadin  and will be managed by coumadin  clinic.  Leverne Amrhein, PA-C

## 2023-06-12 ENCOUNTER — Ambulatory Visit (INDEPENDENT_AMBULATORY_CARE_PROVIDER_SITE_OTHER)

## 2023-06-12 ENCOUNTER — Encounter: Payer: Self-pay | Admitting: Cardiology

## 2023-06-12 DIAGNOSIS — Z7901 Long term (current) use of anticoagulants: Secondary | ICD-10-CM | POA: Diagnosis not present

## 2023-06-12 LAB — POCT INR: INR: 2.1 (ref 2.0–3.0)

## 2023-06-12 NOTE — Progress Notes (Signed)
 Continue 1/2 tablet daily except take 1 tablet on Fridays. Recheck in 4 weeks. Contact the coumadin  clinic number at [phone], if any changes or any questions.

## 2023-06-12 NOTE — Patient Instructions (Addendum)
 Pre visit review using our clinic review tool, if applicable. No additional management support is needed unless otherwise documented below in the visit note.  Continue 1/2 tablet daily except take 1 tablet on Fridays. Recheck in 4 weeks. Contact the coumadin clinic number at [phone], if any changes or any questions.

## 2023-06-15 ENCOUNTER — Other Ambulatory Visit: Payer: Self-pay | Admitting: Internal Medicine

## 2023-06-15 DIAGNOSIS — M059 Rheumatoid arthritis with rheumatoid factor, unspecified: Secondary | ICD-10-CM

## 2023-07-10 ENCOUNTER — Ambulatory Visit (INDEPENDENT_AMBULATORY_CARE_PROVIDER_SITE_OTHER)

## 2023-07-10 DIAGNOSIS — Z7901 Long term (current) use of anticoagulants: Secondary | ICD-10-CM | POA: Diagnosis not present

## 2023-07-10 LAB — POCT INR: INR: 2.1 (ref 2.0–3.0)

## 2023-07-10 NOTE — Progress Notes (Signed)
 Pt has been on the low end of her range for the last two INR readings and the result before that was 1.4. Pt was stable on prior dosing of 1 tablet twice daily. Will make a small dose change to try to push INR to goal of 2.5. Advised if any s/s of abnormal bruising or bleeding to go to ER. Pt verbalized understanding.  Change weekly dose to take 1/2 tablet daily except take 1 tablet on Tuesday and Fridays. Recheck in 2 weeks. Contact the coumadin  clinic number at [phone], if any changes or any questions.

## 2023-07-10 NOTE — Patient Instructions (Addendum)
 Pre visit review using our clinic review tool, if applicable. No additional management support is needed unless otherwise documented below in the visit note.  Change weekly dose to take 1/2 tablet daily except take 1 tablet on Tuesday and Fridays. Recheck in 2 weeks. Contact the coumadin  clinic number at [phone], if any changes or any questions.

## 2023-07-24 ENCOUNTER — Other Ambulatory Visit: Payer: Self-pay | Admitting: Medical

## 2023-07-24 ENCOUNTER — Encounter: Payer: Self-pay | Admitting: Medical

## 2023-07-24 ENCOUNTER — Encounter: Payer: Self-pay | Admitting: Cardiology

## 2023-07-24 ENCOUNTER — Ambulatory Visit (INDEPENDENT_AMBULATORY_CARE_PROVIDER_SITE_OTHER)

## 2023-07-24 DIAGNOSIS — Z7901 Long term (current) use of anticoagulants: Secondary | ICD-10-CM

## 2023-07-24 LAB — POCT INR: INR: 2 (ref 2.0–3.0)

## 2023-07-24 MED ORDER — WARFARIN SODIUM 5 MG PO TABS: ORAL_TABLET | ORAL | 1 refills | Status: DC

## 2023-07-24 MED ORDER — ALPRAZOLAM 0.5 MG PO TABS
0.5000 mg | ORAL_TABLET | Freq: Two times a day (BID) | ORAL | 1 refills | Status: DC | PRN
Start: 2023-07-24 — End: 2023-10-02

## 2023-07-24 NOTE — Patient Instructions (Addendum)
 Pre visit review using our clinic review tool, if applicable. No additional management support is needed unless otherwise documented below in the visit note.  Change weekly dose to take 1/2 tablet daily except take 1 tablet on Mondays, Wednesdays and Fridays. Recheck in 2 weeks.

## 2023-07-24 NOTE — Addendum Note (Signed)
 Addended by: Serafina Damme on: 07/24/2023 05:20 PM   Modules accepted: Orders

## 2023-07-24 NOTE — Progress Notes (Signed)
 Pt has been on the low end of her range for the last four INR readings and the result before that was subtherapeutic before that. Even after a change in dosing last check her INR is lower than last check, which was 2.1. Will make another small dose change to try to push INR to goal of 2.5. Pt reports she is eating more fruit in the last few weeks, watermelon and cantaloupe.  Advised if any s/s of abnormal bruising or bleeding to go to ER. Pt verbalized understanding.  Change weekly dose to take 1/2 tablet daily except take 1 tablet on Mondays, Wednesdays and Fridays. Recheck in 2 weeks. Contact the coumadin  clinic number at [phone], if any changes or any questions.   Pt is compliant with warfarin management and PCP apts.  Sent in refill of warfarin to requested pharmacy.

## 2023-07-25 NOTE — Telephone Encounter (Signed)
 Requesting: alprazolam  0.5mg   Contract: 03/26/23 UDS: 03/26/23 Last Visit: 03/26/23 Next Visit: None Last Refill: 04/02/23 #60 and 3RF   Please Advise

## 2023-07-26 NOTE — Progress Notes (Deleted)
 Office Visit Note  Patient: Rita Watkins             Date of Birth: 09/12/1955           MRN: [id number]             PCP: Francine Iron Referring: Francine Iron Visit Date: 08/08/2023   Subjective:  No chief complaint on file.   History of Present Illness: Rita Watkins is a 68 y.o. 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 weekly folic acid  1 mg daily.    Previous HPI 05/09/2023 Rita Watkins is a 68 y.o. 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 weekly folic acid  1 mg daily.     Rheumatoid arthritis has improved with methotrexate  treatment, as indicated by a decrease in sedimentation rate from 106 to 43. She experiences minimal symptoms currently and notes significant improvement with the medication.   Her shoulder arthritis flares with weather changes, particularly affecting the shoulder. The pain does not radiate down her arm and does not disturb her sleep, although discomfort occurs if she sleeps on the shoulder.   Leg swelling has significantly improved with the use of compression socks, allowing her to wear different pairs of shoes instead of slippers. No recent illness reported.   She attributes some redness on her leg to a space heater near her recliner, which she has adjusted to rotate instead of direct heat.      Previous HPI 12/19/2022 Rita Watkins is a 68 y.o. 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 weekly folic acid  1 mg daily.  Overall feels arthritis symptoms are doing well.  No significant daily joint pain and stiffness and no major flareups.  She does notice cramping in her hands occurs pretty regularly often while working for prolonged time on the computer.  She often believes this with stretching or massaging her forearm muscles when it happens.  Not associated with any numbness loss of grip strength or unintentionally dropping items.   Previous  HPI 09/18/2022 Rita Watkins is a 68 y.o. 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 weekly folic acid  1 mg daily.  Since her last visit she had an infection at left leg stump with a small ulcer or dehiscence lesion.  This cleared up and she completed antibiotics course. Did not contact our office or hold medications at the time.  New problem since her last visit issue with cramping or getting stuck in flexed position affecting the thumbs on both hands and other MCP joints on her right hand.  This comes and goes without warning sometimes at rest sometimes with activity such as hand grafts or while driving.  Releases after a few minutes.  Otherwise not having any flareup or increase of joint pain and swelling.   Previous HPI 06/14/2022 Rita Watkins is a 68 y.o. 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 weekly folic acid  1 mg daily.  Overall symptoms are doing well she has not had any particular exacerbation and does not experience any daily joint pain or swelling in her hands.  Still has issues with left shoulder pain typically more use related such as doing repetitive lifting working in her garden.  Rates it most days as around 3 out of 10 in severity but can get up to 10 out  of 10 occasionally.   03/16/22 Rita Watkins is a 68 y.o. 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 weekly and folic acid  1 mg daily.  Joint pain and swelling in her hands are doing well and left shoulder pain remains mostly improved.  She is noticing some increased pain and stiffness at the right shoulder which is a new problem.  It is not hurting at rest just with use and no radiation.  Still has some redness and irritation overlying the swelling at her right ankle with itching.   12/08/21 Rita Watkins is a 68 y.o. female here for follow up for evaluation of inflammatory arthritis concern for RA with repeat joint swelling, pain, and increased  sedimentation rate. Since starting the methotrexate  15 mg PO weekly she feels a good improvement in symptoms with almost no morning stiffness and no flare ups. Shoulder and hand pains are better. Still has about the same degree of leg swelling and redness on the lateral side. She does notice some headache after taking each weekly dose but it lasts less than one day duration. She notices occasional shoulder pain provoked with lifting cases of water.   10/25/2021 Rita Watkins is a 68 y.o. female here for follow up for evaluation of inflammatory arthritis concern for RA with repeat joint swelling, pain, and increased sedimentation rate and improvement on prednisone . Xrays at last visit showed no erosive disease and mild severity of OA changes. Lab tests were all negative for markers of ANA-related disease process. CBC and CMP okay, does have mild renal impairment. Since our visit she has a flare up of left shoulder pain limiting mobility. Pain radiates into the arm when getting more severe. Her ankle remains the same amount swollen. No new episode of hand swelling.   Previous HPI 10/06/2021 Rita Watkins is a 69 y.o. 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 She developed pain and swelling involving ankle and in right 2nd finger that was treated with prednisone  taper with good response in PCP office. Lab testing positive for RF and elevated uric acid level. She has had joint pain in multiple areas but symptoms significantly increased since earlier this year.  In May she developed severe pain involving the left shoulder and right hand particularly her second finger.  She saw significant swelling in the hands and around the right ankle.  She was treated with a oral prednisone  course lasting 6 days and the symptoms stayed improved for a little over 1 month.  She had a recurrence of joint swelling in July that also improved quickly with oral prednisone .  Due to the swelling and warmth around the ankle she  is been treated with oral antibiotics that did not significantly affect her symptoms. 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  Was complicated by ultimately developing sepsis requiring hospital visit and prolonged nursing care treatment course.   Labs reviewed 06/2021 ANA 1:80 cytoplasmic reticular/AMA RF 40 Uric acid 8.3 ESR 33 CPR <1   No Rheumatology ROS completed.   PMFS History:  Patient Active Problem List   Diagnosis Date Noted   History of DVT (deep vein thrombosis) 05/15/2023   Locking finger joint 09/18/2022   High risk medication use 03/16/2022   Seropositive rheumatoid arthritis (HCC) 10/06/2021   Sedimentation rate elevation 10/06/2021  Ankle swelling, right 10/06/2021   Cardiac murmur 04/20/2020   Obesity (BMI 30.0-34.9) 04/20/2020   History of pulmonary embolism 04/20/2020   Essential hypertension 04/20/2020   Allergy    Anxiety    COPD (chronic obstructive pulmonary disease) (HCC)    Depression    Hyperlipidemia    Hypertension    Anemia associated with acute blood loss 08/22/2016   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   Peripheral vascular disease (HCC) 08/19/2016   Deep vein thrombosis (DVT) of popliteal vein of both lower extremities (HCC) 08/18/2016   Pulmonary embolism without acute cor pulmonale (HCC) 08/18/2016   Tobacco use 08/18/2016   Open leg wound, left, sequela 08/17/2016   Surgical wound, non healing 08/16/2016   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   Benign essential HTN 08/14/2016   Dizziness 07/19/2016   Nausea 07/19/2016   Necrosis (HCC) 07/19/2016   Mycobacterial infection 06/07/2016   Lymphangitis 06/07/2016   Mycobacterium infection, 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  CT abdomen 7/17:   1.  Large left retroperitoneal hematoma with mixed density blood products.  Assessment for active bleeding is not possible without IV contrast. 2.  Contrast in the renal collecting systems presumably from contrast enhanced studies performed on August 20, 2016 consistent with known impaired renal function.   CT abdomen 7/27:    Anxiety    Benign essential HTN 08/14/2016   Cardiac murmur 04/20/2020   Cigarette smoker 07/15/2014   COPD (chronic obstructive pulmonary disease) (HCC)    COPD (chronic obstructive pulmonary disease) (HCC)    Deep vein thrombosis (DVT) of popliteal vein of both lower extremities (HCC) 08/18/2016   Last Assessment & Plan:  Formatting of this note might be different from the original. Not clear if provoked/unprovoked (pt had trauma to left toe back in 03/2016 with some decreased mobility thereafter) CT chest confirmed acute PE and also concerning for some bilateral upper lobe groundglass opacities Hematology on board and heparin drip started but  discontinued 7/17  2/2 large retroperitoneal he   Depression    Depression with anxiety 09/13/2012   Dizziness 07/19/2016   Essential hypertension 04/20/2020   History of pulmonary embolism 04/20/2020   Hyperlipidemia    Hypertension    Leukocytosis 07/30/2014   Lymphangitis 06/07/2016   Mycobacterial infection 06/07/2016   Mycobacterial infection 06/07/2016   Mycobacterium infection, 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 Per dermatology: Please provide the patient with our outpatient clinic phone number [phone]) to call and schedule a follow up appointment for continued management.   Last Assessment & Plan:  Formatting of this  note might be different from the original. Uncertain etiology Vasculitic vs infectious vs malignancy Extensive work up has   Obesity 09/20/2016   Last Assessment & Plan:  Formatting of this note might be different from the original. Body mass index is 22.67 kg/m.  - For weight loss counseling.   Obesity (BMI 30.0-34.9) 04/20/2020   Open leg wound, left, sequela 08/17/2016   Formatting of this note might be different from the original. - Chronic left wound starting in April 2018 and worsening despite antibiotics and debridement -Patient had multiple hospitalizations and was  treated with steroids and antibiotics with worsening/extension of the wound - ESR 103, CRP 168 - ANA, factor V Leyden, Antithrombin III , Cardiolite and antibody IgG, C3 and C4 complement proteinase   Peripheral vascular disease (HCC) 08/19/2016   Last Assessment & Plan:  Formatting of this note might be different from the original. 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  ASA 81 mg daily PT consult   Pulmonary embolism without acute cor pulmonale (HCC) 08/18/2016   Last Assessment & Plan:  Formatting of this note might be different from the original. 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 Added automatically from request for surgery [id number]  Last Assessment & Plan:  Formatting of this note might be different from the original.  Presented with large area of necrotic tissue LLE Bilateral popliteal DVT Etiology uncertain:  Ischemic vs vasculitis vs autoimmune vs possible occult malignancy Bx: leukocytoblastic vasculopathy  In   Tobacco use 08/18/2016   Last Assessment & Plan:  Formatting of this note might be different from the original. Patient has a 30+ pack year history of smoking Discussed low dose CT chest for cancer screening at admission, patient open to this but concerned about extra cost as she has no insurance at this time. No obvious suspicious lesions noted on CTA PE study.  Counseled regarding tobacco cessation    Family History  Problem Relation Age of Onset   Hypertension Mother    Hyperlipidemia Mother    Osteoarthritis Mother    Rheum arthritis Mother    Arthritis Mother    Hypertension Brother    Asthma Brother    Osteoarthritis Brother    Obesity Brother    Past Surgical History:  Procedure Laterality Date   FOOT SURGERY     Left   HIP PINNING     3 each hip   IRRIGATION AND DEBRIDEMENT ABSCESS N/A 06/27/2016   Procedure: IRRIGATION AND DEBRIDEMENT LEFT LEG WITH SOTFT TISSUE BIOPSY;  Surgeon: Adalberto Acton, MD;  Location: [facility];  Service: General;  Laterality: N/A;   LEG AMPUTATION Left    LYMPH NODE BIOPSY Left 06/09/2016   Procedure: BIOPSY LEFT LEG;  Surgeon: Thornell Flirt, DO;  Location: [facility];  Service: Plastics;  Laterality: Left;   SLIPPED CAPITAL FEMORAL EPIPHYSIS PINNING     teeth removal  03/2023   Social History   Social History Narrative   Does not exercise.   Immunization History  Administered Date(s) Administered   Fluad  Quad(high Dose 65+) 12/18/2018, 11/01/2020, 11/03/2021   Fluad  Trivalent(High Dose 65+) 10/17/2022   Influenza Split 11/20/2013   Influenza Whole 11/10/2012   Influenza, High Dose Seasonal PF 11/29/2015   Influenza,inj,Quad PF,6+ Mos 10/29/2014, 11/15/2017   PFIZER Comirnaty Martina Sledge Top)Covid-19 Tri-Sucrose Vaccine 05/19/2020    PFIZER(Purple Top)SARS-COV-2 Vaccination 04/20/2019, 05/10/2019   PNEUMOCOCCAL CONJUGATE-20 03/26/2023   Pfizer Covid-19 Vaccine Bivalent Booster 85yrs & up 11/01/2020   Pfizer(Comirnaty )Fall Seasonal  Vaccine 12 years and older 12/07/2021   Pneumococcal Conjugate-13 12/18/2018   Pneumococcal Polysaccharide-23 09/14/2011   Tdap 04/09/2008     Objective: Vital Signs: There were no vitals taken for this visit.   Physical Exam   Musculoskeletal Exam: ***  CDAI Exam: CDAI Score: -- Patient Global: --; Provider Global: -- Swollen: --; Tender: -- Joint Exam 08/08/2023   No joint exam has been documented for this visit   There is currently no information documented on the homunculus. Go to the Rheumatology activity and complete the homunculus joint exam.  Investigation: No additional findings.  Imaging: No results found.  Recent Labs: Lab Results  Component Value Date   WBC 5.1 05/09/2023   HGB 12.0 05/09/2023   PLT 208 05/09/2023   NA 140 05/09/2023   K 4.4 05/09/2023   CL 105 05/09/2023   CO2 28 05/09/2023   GLUCOSE 83 05/09/2023   BUN 15 05/09/2023   CREATININE 1.07 (H) 05/09/2023   BILITOT 0.5 05/09/2023   ALKPHOS 97 03/26/2023   AST 20 05/09/2023   ALT 19 05/09/2023   PROT 6.4 05/09/2023   ALBUMIN 3.9 03/26/2023   CALCIUM  8.3 (L) 05/09/2023   GFRAA 72 08/05/2019    Speciality Comments: No specialty comments available.  Procedures:  No procedures performed Allergies: Avelox [moxifloxacin hcl in nacl], Montelukast sodium, and Pravastatin   Assessment / Plan:     Visit Diagnoses: No diagnosis found.  ***  Orders: No orders of the defined types were placed in this encounter.  No orders of the defined types were placed in this encounter.    Follow-Up Instructions: No follow-ups on file.   Glena Landau, RT  Note - This record has been created using [employer].  Chart creation errors have been sought, but may not always  have been located.  Such creation errors do not reflect on  the standard of medical care.

## 2023-08-02 ENCOUNTER — Ambulatory Visit (HOSPITAL_BASED_OUTPATIENT_CLINIC_OR_DEPARTMENT_OTHER)
Admission: RE | Admit: 2023-08-02 | Discharge: 2023-08-02 | Disposition: A | Discharge status: Home / Self Care | Payer: Self-pay | Source: Ambulatory Visit | Attending: Cardiology | Admitting: Cardiology

## 2023-08-02 DIAGNOSIS — E785 Hyperlipidemia, unspecified: Secondary | ICD-10-CM | POA: Diagnosis not present

## 2023-08-03 LAB — ALT: ALT: 19 IU/L (ref 0–32)

## 2023-08-03 LAB — LIPID PANEL
Chol/HDL Ratio: 4.9 ratio — ABNORMAL HIGH (ref 0.0–4.4)
Cholesterol, Total: 183 mg/dL (ref 100–199)
HDL: 37 mg/dL — ABNORMAL LOW (ref >39)
LDL Chol Calc (NIH): 117 mg/dL — ABNORMAL HIGH (ref 0–99)
Triglycerides: 164 mg/dL — ABNORMAL HIGH (ref 0–149)
VLDL Cholesterol Cal: 29 mg/dL (ref 5–40)

## 2023-08-03 LAB — AST: AST: 27 IU/L (ref 0–40)

## 2023-08-07 ENCOUNTER — Ambulatory Visit

## 2023-08-07 ENCOUNTER — Ambulatory Visit: Payer: Self-pay | Admitting: Cardiology

## 2023-08-07 DIAGNOSIS — Z7901 Long term (current) use of anticoagulants: Secondary | ICD-10-CM | POA: Diagnosis not present

## 2023-08-07 LAB — POCT INR: INR: 3 (ref 2.0–3.0)

## 2023-08-07 NOTE — Patient Instructions (Addendum)
Pre visit review using our clinic review tool, if applicable. No additional management support is needed unless otherwise documented below in the visit note.  Continue 1/2 tablet daily except take 1 tablet on Mondays, Wednesdays and Fridays. Recheck in 4 weeks. Contact the coumadin clinic number at [phone], if any changes or any questions.

## 2023-08-07 NOTE — Progress Notes (Signed)
Continue 1/2 tablet daily except take 1 tablet on Mondays, Wednesdays and Fridays. Recheck in 4 weeks. Contact the coumadin clinic number at [phone], if any changes or any questions.

## 2023-08-08 ENCOUNTER — Ambulatory Visit: Admitting: Internal Medicine

## 2023-08-08 DIAGNOSIS — M059 Rheumatoid arthritis with rheumatoid factor, unspecified: Secondary | ICD-10-CM

## 2023-08-08 DIAGNOSIS — Z79899 Other long term (current) drug therapy: Secondary | ICD-10-CM

## 2023-08-15 ENCOUNTER — Encounter: Payer: Self-pay | Admitting: Internal Medicine

## 2023-08-15 ENCOUNTER — Ambulatory Visit: Attending: Internal Medicine | Admitting: Internal Medicine

## 2023-08-15 ENCOUNTER — Other Ambulatory Visit (HOSPITAL_COMMUNITY): Payer: Self-pay

## 2023-08-15 ENCOUNTER — Other Ambulatory Visit: Payer: Self-pay

## 2023-08-15 VITALS — BP 125/75 | HR 44 | Resp 14 | Ht 69.0 in

## 2023-08-15 DIAGNOSIS — M25512 Pain in left shoulder: Secondary | ICD-10-CM

## 2023-08-15 DIAGNOSIS — Z79899 Other long term (current) drug therapy: Secondary | ICD-10-CM

## 2023-08-15 DIAGNOSIS — M059 Rheumatoid arthritis with rheumatoid factor, unspecified: Secondary | ICD-10-CM

## 2023-08-15 LAB — BASIC METABOLIC PANEL WITH GFR
BUN/Creatinine Ratio: 14 (calc) (ref 6–22)
BUN: 16 mg/dL (ref 7–25)
CO2: 28 mmol/L (ref 20–32)
Calcium: 9 mg/dL (ref 8.6–10.4)
Chloride: 106 mmol/L (ref 98–110)
Creat: 1.18 mg/dL — ABNORMAL HIGH (ref 0.50–1.05)
Glucose, Bld: 81 mg/dL (ref 65–99)
Potassium: 5.1 mmol/L (ref 3.5–5.3)
Sodium: 141 mmol/L (ref 135–146)
eGFR: 50 mL/min/1.73m2 — ABNORMAL LOW (ref >=60)

## 2023-08-15 LAB — CBC WITH DIFFERENTIAL/PLATELET
Absolute Lymphocytes: 1071 {cells}/uL (ref 850–3900)
Absolute Monocytes: 572 {cells}/uL (ref 200–950)
Basophils Absolute: 18 {cells}/uL (ref 0–200)
Basophils Relative: 0.4 %
Eosinophils Absolute: 140 {cells}/uL (ref 15–500)
Eosinophils Relative: 3.1 %
HCT: 37.8 % (ref 35.0–45.0)
Hemoglobin: 12.1 g/dL (ref 11.7–15.5)
MCH: 31.3 pg (ref 27.0–33.0)
MCHC: 32 g/dL (ref 32.0–36.0)
MCV: 97.9 fL (ref 80.0–100.0)
MPV: 10 fL (ref 7.5–12.5)
Monocytes Relative: 12.7 %
Neutro Abs: 2700 {cells}/uL (ref 1500–7800)
Neutrophils Relative %: 60 %
Platelets: 187 Thousand/uL (ref 140–400)
RBC: 3.86 Million/uL (ref 3.80–5.10)
RDW: 14.8 % (ref 11.0–15.0)
Total Lymphocyte: 23.8 %
WBC: 4.5 Thousand/uL (ref 3.8–10.8)

## 2023-08-15 LAB — SEDIMENTATION RATE: Sed Rate: 38 mm/h — ABNORMAL HIGH (ref 0–30)

## 2023-08-15 MED ORDER — FOLIC ACID 1 MG PO TABS
1.0000 mg | ORAL_TABLET | Freq: Every day | ORAL | 0 refills | Status: DC
  Filled 2023-08-15 (×2): qty 90, 90d supply, fill #0

## 2023-08-15 MED ORDER — METHOTREXATE SODIUM 2.5 MG PO TABS: 15.0000 mg | ORAL_TABLET | ORAL | 0 refills | Status: DC

## 2023-08-15 MED ORDER — PREDNISONE 10 MG PO TABS: ORAL_TABLET | ORAL | 0 refills | Status: AC

## 2023-08-15 NOTE — Progress Notes (Signed)
 Office Visit Note  Patient: Rita Watkins             Date of Birth: 1955-12-24           MRN: [id number]             PCP: Dorina Dallas RIGGERS Referring: Dorina Dallas RIGGERS Visit Date: 08/15/2023   Subjective:  Follow-up (Patient states she is having muscle pain in her left shoulder. )   Discussed the use of AI scribe software for clinical note transcription with the patient, who gave verbal consent to proceed.  History of Present Illness   Rita Watkins is a 68 y.o. 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 weekly folic acid  1 mg daily.    She experienced significant left shoulder pain following a CT scan procedure two weeks ago, during which she had to keep her arms raised above her head for an extended period. The pain was severe during the procedure but has since subsided. She remains cautious, especially at night, avoiding lying on the affected shoulder.  The pain is currently localized to the shoulder area without radiation down the arm. No major flare-ups have occurred since the incident, and she has not sought additional treatment to avoid interfering with her INR levels due to concerns about the impact of steroids on her INR management.  She has not experienced any recent viral or bacterial infections and reports no issues with her methotrexate  treatment. She experiences morning stiffness, which she manages with stretching exercises before getting out of bed, allowing her to move without pain or stiffness.  Her current medications include methotrexate  and folic acid .      Previous HPI 05/09/2023 Rita Watkins is a 68 y.o. 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 weekly folic acid  1 mg daily.     Rheumatoid arthritis has improved with methotrexate  treatment, as indicated by a decrease in sedimentation rate from 106 to 43. She experiences minimal symptoms currently and notes significant improvement with the  medication.   Her shoulder arthritis flares with weather changes, particularly affecting the shoulder. The pain does not radiate down her arm and does not disturb her sleep, although discomfort occurs if she sleeps on the shoulder.   Leg swelling has significantly improved with the use of compression socks, allowing her to wear different pairs of shoes instead of slippers. No recent illness reported.   She attributes some redness on her leg to a space heater near her recliner, which she has adjusted to rotate instead of direct heat.      Previous HPI 12/19/2022 Rita Watkins is a 68 y.o. 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 weekly folic acid  1 mg daily.  Overall feels arthritis symptoms are doing well.  No significant daily joint pain and stiffness and no major flareups.  She does notice cramping in her hands occurs pretty regularly often while working for prolonged time on the computer.  She often believes this with stretching or massaging her forearm muscles when it happens.  Not associated with any numbness loss of grip strength or unintentionally dropping items.   Previous HPI 09/18/2022 Rita Watkins is a 68 y.o. 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 weekly folic acid  1 mg daily.  Since her last visit she had an infection at left leg stump with a small ulcer or  dehiscence lesion.  This cleared up and she completed antibiotics course. Did not contact our office or hold medications at the time.  New problem since her last visit issue with cramping or getting stuck in flexed position affecting the thumbs on both hands and other MCP joints on her right hand.  This comes and goes without warning sometimes at rest sometimes with activity such as hand grafts or while driving.  Releases after a few minutes.  Otherwise not having any flareup or increase of joint pain and swelling.   Previous HPI 06/14/2022 Rita Watkins is a 68 y.o.  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 weekly folic acid  1 mg daily.  Overall symptoms are doing well she has not had any particular exacerbation and does not experience any daily joint pain or swelling in her hands.  Still has issues with left shoulder pain typically more use related such as doing repetitive lifting working in her garden.  Rates it most days as around 3 out of 10 in severity but can get up to 10 out of 10 occasionally.   03/16/22 Rita Watkins is a 68 y.o. 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 weekly and folic acid  1 mg daily.  Joint pain and swelling in her hands are doing well and left shoulder pain remains mostly improved.  She is noticing some increased pain and stiffness at the right shoulder which is a new problem.  It is not hurting at rest just with use and no radiation.  Still has some redness and irritation overlying the swelling at her right ankle with itching.   12/08/21 Rita Watkins is a 68 y.o. female here for follow up for evaluation of inflammatory arthritis concern for RA with repeat joint swelling, pain, and increased sedimentation rate. Since starting the methotrexate  15 mg PO weekly she feels a good improvement in symptoms with almost no morning stiffness and no flare ups. Shoulder and hand pains are better. Still has about the same degree of leg swelling and redness on the lateral side. She does notice some headache after taking each weekly dose but it lasts less than one day duration. She notices occasional shoulder pain provoked with lifting cases of water.   10/25/2021 Rita Watkins is a 68 y.o. female here for follow up for evaluation of inflammatory arthritis concern for RA with repeat joint swelling, pain, and increased sedimentation rate and improvement on prednisone . Xrays at last visit showed no erosive disease and mild severity of OA changes. Lab tests were all negative for markers of ANA-related  disease process. CBC and CMP okay, does have mild renal impairment. Since our visit she has a flare up of left shoulder pain limiting mobility. Pain radiates into the arm when getting more severe. Her ankle remains the same amount swollen. No new episode of hand swelling.   Previous HPI 10/06/2021 AUBREY BLACKARD is a 68 y.o. 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 She developed pain and swelling involving ankle and in right 2nd finger that was treated with prednisone  taper with good response in PCP office. Lab testing positive for RF and elevated uric acid level. She has had joint pain in multiple areas but symptoms significantly increased since earlier this year.  In May she developed severe pain involving the left shoulder and right hand particularly her second finger.  She saw significant swelling in the hands and around the right  ankle.  She was treated with a oral prednisone  course lasting 6 days and the symptoms stayed improved for a little over 1 month.  She had a recurrence of joint swelling in July that also improved quickly with oral prednisone .  Due to the swelling and warmth around the ankle she is been treated with oral antibiotics that did not significantly affect her symptoms. 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  Was complicated by ultimately developing sepsis requiring hospital visit and prolonged nursing care treatment course.   Labs reviewed 06/2021 ANA 1:80 cytoplasmic reticular/AMA RF 40 Uric acid 8.3 ESR 33 CPR <1   Review of Systems  Constitutional:  Negative for fatigue.  HENT:  Negative for mouth sores and mouth dryness.   Eyes:  Negative for dryness.  Respiratory:  Negative for shortness of breath.   Cardiovascular:  Negative for chest pain and palpitations.   Gastrointestinal:  Negative for blood in stool, constipation and diarrhea.  Endocrine: Negative for increased urination.  Genitourinary:  Negative for involuntary urination.  Musculoskeletal:  Positive for joint pain, joint pain and muscle tenderness. Negative for gait problem, joint swelling, myalgias, muscle weakness, morning stiffness and myalgias.  Skin:  Positive for sensitivity to sunlight. Negative for color change, rash and hair loss.  Allergic/Immunologic: Negative for susceptible to infections.  Neurological:  Negative for dizziness and headaches.  Hematological:  Negative for swollen glands.  Psychiatric/Behavioral:  Negative for depressed mood and sleep disturbance. The patient is not nervous/anxious.     PMFS History:  Patient Active Problem List   Diagnosis Date Noted   History of DVT (deep vein thrombosis) 05/15/2023   Locking finger joint 09/18/2022   High risk medication use 03/16/2022   Seropositive rheumatoid arthritis (HCC) 10/06/2021   Sedimentation rate elevation 10/06/2021   Ankle swelling, right 10/06/2021   Cardiac murmur 04/20/2020   Obesity (BMI 30.0-34.9) 04/20/2020   History of pulmonary embolism 04/20/2020   Essential hypertension 04/20/2020   Allergy    Anxiety    COPD (chronic obstructive pulmonary disease) (HCC)    Depression    Hyperlipidemia    Hypertension    Anemia associated with acute blood loss 08/22/2016   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   Peripheral vascular disease (HCC) 08/19/2016   Deep vein thrombosis (DVT) of popliteal vein of both lower extremities (HCC) 08/18/2016   Pulmonary embolism without acute cor pulmonale (HCC) 08/18/2016   Tobacco use 08/18/2016   Open leg wound, left, sequela 08/17/2016   Surgical wound, non healing 08/16/2016   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   Benign essential HTN 08/14/2016   Dizziness 07/19/2016   Nausea 07/19/2016   Necrosis (HCC) 07/19/2016   Mycobacterial infection 06/07/2016    Lymphangitis 06/07/2016   Mycobacterium infection, 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  CT abdomen 7/17:   1.  Large left retroperitoneal hematoma with mixed density blood products.  Assessment for active bleeding is not possible without IV contrast. 2.  Contrast in the renal collecting systems presumably from contrast enhanced studies performed on August 20, 2016 consistent with known impaired renal function.   CT abdomen 7/27:  Anxiety    Benign essential HTN 08/14/2016   Cardiac murmur 04/20/2020   Cigarette smoker 07/15/2014   COPD (chronic obstructive pulmonary disease) (HCC)    COPD (chronic obstructive pulmonary disease) (HCC)    Deep vein thrombosis (DVT) of popliteal vein of both lower extremities (HCC) 08/18/2016   Last Assessment & Plan:  Formatting of this note might be different from the original. Not clear if provoked/unprovoked (pt had trauma to left toe back in 03/2016 with some decreased mobility thereafter) CT chest confirmed acute PE and also concerning for some bilateral upper lobe groundglass opacities Hematology on board and heparin drip started but  discontinued 7/17  2/2 large retroperitoneal he   Depression    Depression with anxiety 09/13/2012   Dizziness 07/19/2016   Essential hypertension 04/20/2020   History of pulmonary embolism 04/20/2020   Hyperlipidemia    Hypertension    Leukocytosis 07/30/2014   Lymphangitis 06/07/2016   Mycobacterial infection 06/07/2016   Mycobacterial infection 06/07/2016   Mycobacterium infection, 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 Per dermatology: Please provide the patient with our  outpatient clinic phone number [phone]) to call and schedule a follow up appointment for continued management.   Last Assessment & Plan:  Formatting of this note might be different from the original. Uncertain etiology Vasculitic vs infectious vs malignancy Extensive work up has   Obesity 09/20/2016   Last Assessment & Plan:  Formatting of this note might be different from the original. Body mass index is 22.67 kg/m.  - For weight loss counseling.   Obesity (BMI 30.0-34.9) 04/20/2020   Open leg wound, left, sequela 08/17/2016   Formatting of this note might be different from the original. - Chronic left wound starting in April 2018 and worsening despite antibiotics and debridement -Patient had multiple hospitalizations and was treated with steroids and antibiotics with worsening/extension of the wound - ESR 103, CRP 168 - ANA, factor V Leyden, Antithrombin III , Cardiolite and antibody IgG, C3 and C4 complement proteinase   Peripheral vascular disease (HCC) 08/19/2016   Last Assessment & Plan:  Formatting of this note might be different from the original. 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  ASA 81 mg daily PT consult   Pulmonary embolism without acute cor pulmonale (HCC) 08/18/2016   Last Assessment & Plan:  Formatting of this note might be different from the original. Uncertain etiology  Was on heparin gtt but discontinued 2/2 large retroperitoneal hematoma (now stable on follow up CT) O2 PRN IVC placed  Vascular following No SOB, SpO2 93% on room air On warfarin, on hold due to supratherapeutic INR, will restart at 3mg  once able   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   Surgical wound, non healing 08/16/2016   Formatting of this note might be different from  the original. Added automatically from request for surgery [id number]  Last Assessment & Plan:  Formatting of this note might be different from the original. Presented with large area of necrotic tissue LLE Bilateral popliteal DVT Etiology uncertain:  Ischemic vs vasculitis vs autoimmune vs possible occult malignancy Bx: leukocytoblastic vasculopathy  In   Tobacco use 08/18/2016   Last Assessment &  Plan:  Formatting of this note might be different from the original. Patient has a 30+ pack year history of smoking Discussed low dose CT chest for cancer screening at admission, patient open to this but concerned about extra cost as she has no insurance at this time. No obvious suspicious lesions noted on CTA PE study.  Counseled regarding tobacco cessation    Family History  Problem Relation Age of Onset   Hypertension Mother    Hyperlipidemia Mother    Osteoarthritis Mother    Rheum arthritis Mother    Arthritis Mother    Hypertension Brother    Asthma Brother    Osteoarthritis Brother    Obesity Brother    Past Surgical History:  Procedure Laterality Date   FOOT SURGERY     Left   HIP PINNING     3 each hip   IRRIGATION AND DEBRIDEMENT ABSCESS N/A 06/27/2016   Procedure: IRRIGATION AND DEBRIDEMENT LEFT LEG WITH SOTFT TISSUE BIOPSY;  Surgeon: Signe Mitzie LABOR, MD;  Location: [facility];  Service: General;  Laterality: N/A;   LEG AMPUTATION Left    LYMPH NODE BIOPSY Left 06/09/2016   Procedure: BIOPSY LEFT LEG;  Surgeon: Lowery Estefana RAMAN, DO;  Location: [facility];  Service: Plastics;  Laterality: Left;   SLIPPED CAPITAL FEMORAL EPIPHYSIS PINNING     teeth removal  03/2023   Social History   Social History Narrative   Does not exercise.   Immunization History  Administered Date(s) Administered   Fluad  Quad(high Dose 65+) 12/18/2018, 11/01/2020, 11/03/2021   Fluad  Trivalent(High Dose 65+) 10/17/2022   Influenza Split 11/20/2013   Influenza Whole 11/10/2012   Influenza, High Dose Seasonal  PF 11/29/2015   Influenza,inj,Quad PF,6+ Mos 10/29/2014, 11/15/2017   PFIZER Comirnaty (Gray Top)Covid-19 Tri-Sucrose Vaccine 05/19/2020   PFIZER(Purple Top)SARS-COV-2 Vaccination 04/20/2019, 05/10/2019   PNEUMOCOCCAL CONJUGATE-20 03/26/2023   Pfizer Covid-19 Vaccine Bivalent Booster 21yrs & up 11/01/2020   Pfizer(Comirnaty )Fall Seasonal Vaccine 12 years and older 12/07/2021   Pneumococcal Conjugate-13 12/18/2018   Pneumococcal Polysaccharide-23 09/14/2011   Tdap 04/09/2008     Objective: Vital Signs: BP 125/75 (BP Location: Left Arm, Patient Position: Sitting, Cuff Size: Large)   Pulse (!) 44   Resp 14   Ht 5' 9 (1.753 m)   BMI 35.44 kg/m    Physical Exam Constitutional:      Comments: In wheelchair  Eyes:     Conjunctiva/sclera: Conjunctivae normal.  Cardiovascular:     Rate and Rhythm: Normal rate and regular rhythm.  Pulmonary:     Effort: Pulmonary effort is normal.     Breath sounds: Normal breath sounds.  Lymphadenopathy:     Cervical: No cervical adenopathy.  Skin:    General: Skin is warm and dry.     Comments: 1+ pitting edema in right ankle Small area of erythema on lateral side  Neurological:     Mental Status: She is alert.  Psychiatric:        Mood and Affect: Mood normal.      Musculoskeletal Exam:  Right shoulder full ROM, left shoulder painful with internal and external rotation, guarding against full abduction, tenderness to pressure worse on anterior side Elbows full ROM no tenderness or swelling Wrists full ROM no tenderness or swelling Fingers full ROM 1st CMC joint squaring, no tenderness or swelling Left leg AKA  Investigation: No additional findings.  Imaging: CT CARDIAC SCORING (SELF PAY ONLY) Addendum Date: 08/06/2023 ADDENDUM REPORT: 08/06/2023 14:15 EXAM: OVER-READ INTERPRETATION  CT CHEST The  following report is an over-read performed by radiologist Dr. CHRISTELLA. [facility], [place] on 08/06/2023. This over-read does not include  interpretation of cardiac or coronary anatomy or pathology. The coronary CTA interpretation by the cardiologist is attached. COMPARISON:  None. FINDINGS: Calcified atherosclerosis of the aorta and native coronary vasculature. Normal caliber aorta. No aneurysm. No mediastinal hemorrhage or hematoma. Normal heart size. No pericardial effusion. No central bulky adenopathy. Visualized central airways are patent. Esophagus nondilated. No hiatal hernia. Limited lung windows demonstrate no acute airspace process, pneumonia, collapse or consolidation. Very minor upper lobe emphysema pattern. Posterior right lower lobe 8 mm part solid ground-glass nodule, image 26/303. No pleural abnormality, effusion, or pneumothorax. No chest wall soft tissue abnormality or asymmetry. Degenerative changes noted throughout the spine. No acute osseous finding appreciated. Limited upper abdomen demonstrates no acute finding. IMPRESSION: 1. No acute extra cardiac finding by CT. 2. 8 mm right lower lobe part solid ground-glass pulmonary nodule. Follow-up non-contrast CT recommended at 3-6 months to confirm persistence. If unchanged, and solid component remains <6 mm, annual CT is recommended until 5 years of stability has been established. If persistent these nodules should be considered highly suspicious if the solid component of the nodule is 6 mm or greater in size and enlarging. This recommendation follows the consensus statement: Guidelines for Management of Incidental Pulmonary Nodules Detected on CT Images: From the Fleischner Society 2017; Radiology 2017; 284:228-243. Aortic Atherosclerosis (ICD10-I70.0) and Emphysema (ICD10-J43.9). Electronically Signed   By: CHRISTELLA.  Shick M.D.   On: 08/06/2023 14:15   Result Date: 08/06/2023 : CLINICAL DATA:  Cardiovascular Disease Risk stratification EXAM: Coronary Calcium  Score TECHNIQUE: A gated, non-contrast computed tomography scan of the heart was performed using 3mm slice thickness. Axial images  were analyzed on a dedicated workstation. Calcium  scoring of the coronary arteries was performed using the Agatston method. FINDINGS: Coronary Calcium  Score: Left main: 387 Left anterior descending artery: 908 Left circumflex artery: 593 Right coronary artery: 80.1 Total: 1969 Pericardium: Normal. Ascending Aorta: Normal caliber. Mild calcifications, moderate calcifications of the descending thoracic aorta. Pulmonary artery: Normal caliber Non-cardiac: See separate report from [facility]. IMPRESSION: Coronary calcium  score of 1969. This was 2 percentile for age-, race-, and sex-matched controls. RECOMMENDATIONS: Coronary artery calcium  (CAC) score is a strong predictor of incident coronary heart disease (CHD) and provides predictive information beyond traditional risk factors. CAC scoring is reasonable to use in the decision to withhold, postpone, or initiate statin therapy in intermediate-risk or selected borderline-risk asymptomatic adults (age 54-75 years and LDL-C >=70 to <190 mg/dL) who do not have diabetes or established atherosclerotic cardiovascular disease (ASCVD).* In intermediate-risk (10-year ASCVD risk >=7.5% to <20%) adults or selected borderline-risk (10-year ASCVD risk >=5% to <7.5%) adults in whom a CAC score is measured for the purpose of making a treatment decision the following recommendations have been made: If CAC=0, it is reasonable to withhold statin therapy and reassess in 5 to 10 years, as long as higher risk conditions are absent (diabetes mellitus, family history of premature CHD in first degree relatives (males <55 years; females <65 years), cigarette smoking, or LDL >=190 mg/dL). If CAC is 1 to 99, it is reasonable to initiate statin therapy for patients >=49 years of age. If CAC is >=100 or >=75th percentile, it is reasonable to initiate statin therapy at any age. Cardiology referral should be considered for patients with CAC scores >=400 or >=75th percentile. *2018  AHA/ACC/AACVPR/AAPA/ABC/ACPM/ADA/AGS/APhA/ASPC/NLA/PCNA Guideline on the Management of Blood Cholesterol: A Report of the  Psychologist, forensic on Surveyor, mining Guidelines. J Am Coll Cardiol. 2019;73(24):3168-3209. Electronically Signed: By: Lamar Fitch M.D. On: 08/05/2023 21:15    Recent Labs: Lab Results  Component Value Date   WBC 5.1 05/09/2023   HGB 12.0 05/09/2023   PLT 208 05/09/2023   NA 140 05/09/2023   K 4.4 05/09/2023   CL 105 05/09/2023   CO2 28 05/09/2023   GLUCOSE 83 05/09/2023   BUN 15 05/09/2023   CREATININE 1.07 (H) 05/09/2023   BILITOT 0.5 05/09/2023   ALKPHOS 97 03/26/2023   AST 27 08/02/2023   ALT 19 08/02/2023   PROT 6.4 05/09/2023   ALBUMIN 3.9 03/26/2023   CALCIUM  8.3 (L) 05/09/2023   GFRAA 72 08/05/2019    Speciality Comments: Patient states she would like her folic acid  to go to [employer]  Procedures:  No procedures performed Allergies: Avelox [moxifloxacin hcl in nacl], Montelukast sodium, and Pravastatin   Assessment / Plan:     Visit Diagnoses: Seropositive rheumatoid arthritis (HCC) - Plan: Sedimentation rate, predniSONE  (DELTASONE ) 10 MG tablet, methotrexate  (RHEUMATREX) 2.5 MG tablet, folic acid  (FOLVITE ) 1 MG tablet Inflammatory arthritis still appears overall pretty well-controlled.  I do not think we could really achieve disease remission but with a large reduction of the serum inflammatory markers and clinical improvement prefers to avoid escalating treatment or adding injectables. -Checking sed rate for disease activity monitoring -Continue methotrexate  15 mg p.o. weekly and folic acid  1 mg daily  High risk medication use - Plan: Basic Metabolic Panel (BMET), CBC with Differential/Platelet Had recent liver function tests reviewed which were okay no problem from that methotrexate . - Checking CBC and BMP for medication monitoring on continued long-term use of methotrexate   Acute pain  of left shoulder - Plan: predniSONE  (DELTASONE ) 10 MG tablet Shoulder impingement syndrome Right shoulder impingement likely due to bursitis or rotator cuff tendon swelling. Symptoms worsen with external rotation or elevation. No structural issues, primarily inflammation. She preferred oral steroids over injections due to pain concerns. - Prescribed oral steroids taper starting 30 mg daily - If symptoms not improved recommended trial of injection or updated imaging and PT evaluation  Mild osteoarthritis of shoulder Mild osteoarthritis noted in previous x-rays, not the primary pain source.     Orders: Orders Placed This Encounter  Procedures   Sedimentation rate   Basic Metabolic Panel (BMET)   CBC with Differential/Platelet   Meds ordered this encounter  Medications   predniSONE  (DELTASONE ) 10 MG tablet    Sig: Take 3 tablets (30 mg total) by mouth daily with breakfast for 2 days, THEN 2 tablets (20 mg total) daily with breakfast for 2 days, THEN 1 tablet (10 mg total) daily with breakfast for 2 days.    Dispense:  12 tablet    Refill:  0   methotrexate  (RHEUMATREX) 2.5 MG tablet    Sig: Take 6 tablets (15 mg total) by mouth once a week. Caution: Chemotherapy. Protect from light.    Dispense:  72 tablet    Refill:  0   folic acid  (FOLVITE ) 1 MG tablet    Sig: Take 1 tablet (1 mg total) by mouth daily. (While on methotrexate  therapy)    Dispense:  90 tablet    Refill:  0    Do not run on insurance - use $5/30 or $15 / 90 [facility] list. DELIVERY - pt instructed to call to set up payment.     Follow-Up Instructions: Return in about  3 months (around 11/15/2023) for RA on MTX f/u 3mos.   Lonni LELON Ester, MD  Note - This record has been created using [employer].  Chart creation errors have been sought, but may not always  have been located. Such creation errors do not reflect on  the standard of medical care.

## 2023-08-16 ENCOUNTER — Ambulatory Visit: Attending: Cardiology | Admitting: Cardiology

## 2023-08-16 ENCOUNTER — Encounter: Payer: Self-pay | Admitting: Cardiology

## 2023-08-16 VITALS — BP 157/82 | HR 56 | Ht 69.0 in | Wt 225.0 lb

## 2023-08-16 DIAGNOSIS — E782 Mixed hyperlipidemia: Secondary | ICD-10-CM

## 2023-08-16 DIAGNOSIS — I10 Essential (primary) hypertension: Secondary | ICD-10-CM

## 2023-08-16 DIAGNOSIS — R931 Abnormal findings on diagnostic imaging of heart and coronary circulation: Secondary | ICD-10-CM | POA: Insufficient documentation

## 2023-08-16 NOTE — Progress Notes (Signed)
 Cardiology Office Note:    Date:  08/16/2023   ID:  Rita Watkins, DOB Oct 16, 1955, MRN [id number]  PCP:  Dorina Loving, PA-C  Cardiologist:  Lamar Fitch, MD    Referring MD: Saguier, Edward, PA-C   No chief complaint on file.   History of Present Illness:    Rita Watkins is a 68 y.o. female with quite incredible story past medical history significant for essential hypertension smoking she quit in 2019, dyslipidemia in 2019 she sustained an injury to the left foot that happened when she was in [facility], after that she required amputation of the left lack, also complicated by DVT and PE, she is stay long time in the hospital end up having above-knee amputation finally.  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  She was sent to us  because he show dyslipidemia there was a question about aggressiveness of therapy she tried to statin with some side effect she could not tolerate now only on Zetia  did elect to pursue a coronary CT calcium  score that number is very high 1969 which is 99% on the brought her today to months to talk about it.  She is asymptomatic, denies have any chest pain tightness squeezing pressure mid chest but she practically sits in the wheelchair all the time does not walk much.  Likely she does not smoke.  Past Medical History:  Diagnosis Date   Allergy    Anemia associated with acute blood loss 08/22/2016   Formatting of this note is different from the original.  CT abdomen 7/17:   1.  Large left retroperitoneal hematoma with mixed density blood products.  Assessment for active bleeding is not possible without IV contrast. 2.  Contrast in the renal collecting systems presumably from contrast enhanced studies performed on August 20, 2016 consistent with known impaired renal function.   CT abdomen 7/27:    Ankle swelling, right 10/06/2021   Anxiety    Benign essential HTN 08/14/2016   Cardiac murmur 04/20/2020   COPD (chronic obstructive pulmonary disease) (HCC)     COPD (chronic obstructive pulmonary disease) (HCC)    Deep vein thrombosis (DVT) of popliteal vein of both lower extremities (HCC) 08/18/2016   Last Assessment & Plan:  Formatting of this note might be different from the original. Not clear if provoked/unprovoked (pt had trauma to left toe back in 03/2016 with some decreased mobility thereafter) CT chest confirmed acute PE and also concerning for some bilateral upper lobe groundglass opacities Hematology on board and heparin drip started but  discontinued 7/17  2/2 large retroperitoneal he   Depression    Depression with anxiety 09/13/2012   Dizziness 07/19/2016   Essential hypertension 04/20/2020   High risk medication use 03/16/2022   History of DVT (deep vein thrombosis) 05/15/2023   History of pulmonary embolism 04/20/2020   Hyperlipidemia    Hypertension    Leukocytosis 07/30/2014   Locking finger joint 09/18/2022   Lymphangitis 06/07/2016   Mycobacterial infection 06/07/2016   Mycobacterial infection 06/07/2016   Mycobacterium infection, 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 Per dermatology: Please provide the patient with our outpatient clinic phone number [phone]) to call and schedule a follow up appointment for continued management.   Last Assessment & Plan:  Formatting of this note might be different from the original. Uncertain etiology Vasculitic vs infectious vs malignancy Extensive work up  has   Obesity (BMI 30.0-34.9) 04/20/2020   Open leg wound, left, sequela 08/17/2016   Formatting of this note might be different from the original. - Chronic left wound starting in April 2018 and worsening despite antibiotics and debridement -Patient had multiple hospitalizations and was treated with steroids and antibiotics with worsening/extension of the wound - ESR 103, CRP 168 - ANA, factor V  [place], Antithrombin III , Cardiolite and antibody IgG, C3 and C4 complement proteinase   Peripheral vascular disease (HCC) 08/19/2016   Last Assessment & Plan:  Formatting of this note might be different from the original. 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  ASA 81 mg daily PT consult   Pulmonary embolism without acute cor pulmonale (HCC) 08/18/2016   Last Assessment & Plan:  Formatting of this note might be different from the original. Uncertain etiology  Was on heparin gtt but discontinued 2/2 large retroperitoneal hematoma (now stable on follow up CT) O2 PRN IVC placed  Vascular following No SOB, SpO2 93% on room air On warfarin, on hold due to supratherapeutic INR, will restart at 3mg  once able   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   Sedimentation rate elevation 10/06/2021   Seropositive rheumatoid arthritis (HCC) 10/06/2021   Surgical wound, non healing 08/16/2016   Formatting of this note might be different from the original. Added automatically from request for surgery [id number]  Last Assessment & Plan:  Formatting of this note might be different from the original. Presented with large area of necrotic tissue LLE Bilateral popliteal DVT Etiology uncertain:  Ischemic vs vasculitis vs autoimmune vs possible occult malignancy Bx: leukocytoblastic vasculopathy  In   Tobacco use 08/18/2016   Last Assessment & Plan:  Formatting of this note might be different from the original. Patient has a 30+ pack year history of smoking Discussed low dose CT chest for cancer screening at admission, patient open to this but concerned about extra cost as she has no insurance at this time. No obvious suspicious lesions noted on CTA PE study.  Counseled regarding tobacco cessation     Past Surgical History:  Procedure Laterality Date   FOOT SURGERY     Left   HIP PINNING     3 each hip   IRRIGATION AND DEBRIDEMENT ABSCESS N/A 06/27/2016   Procedure: IRRIGATION AND DEBRIDEMENT LEFT LEG WITH SOTFT TISSUE BIOPSY;  Surgeon: Signe Mitzie LABOR, MD;  Location: [facility];  Service: General;  Laterality: N/A;   LEG AMPUTATION Left    LYMPH NODE BIOPSY Left 06/09/2016   Procedure: BIOPSY LEFT LEG;  Surgeon: Lowery Estefana RAMAN, DO;  Location: [facility];  Service: Plastics;  Laterality: Left;   SLIPPED CAPITAL FEMORAL EPIPHYSIS PINNING     teeth removal  03/2023    Current Medications: Current Meds  Medication Sig   albuterol  (VENTOLIN  HFA) 108 (90 Base) MCG/ACT inhaler Inhale 2 puffs into the lungs every 6 (six) hours as needed.   ALPRAZolam  (XANAX ) 0.5 MG tablet TAKE 1 TABLET BY MOUTH 2 TIMES DAILY AS NEEDED FOR ANXIETY.   ALPRAZolam  (XANAX ) 0.5 MG tablet Take 1 tablet (0.5 mg total) by mouth 2 (two) times daily as needed for anxiety.   amLODipine  (NORVASC ) 2.5 MG tablet TAKE 1 TABLET BY MOUTH EVERY DAY   benazepril  (LOTENSIN ) 20 MG  tablet Take 1 tablet (20 mg total) by mouth 2 (two) times daily.   ezetimibe  (ZETIA ) 10 MG tablet Take 1 tablet (10 mg total) by mouth daily.   folic acid  (FOLVITE ) 1 MG tablet Take 1 tablet (1 mg total) by mouth daily. (While on methotrexate  therapy)   gabapentin  (NEURONTIN ) 300 MG capsule Take 1 capsule (300 mg total) by mouth at bedtime.   methotrexate  (RHEUMATREX) 2.5 MG tablet Take 6 tablets (15 mg total) by mouth once a week. Caution: Chemotherapy. Protect from light.   predniSONE  (DELTASONE ) 10 MG tablet Take 3 tablets (30 mg total) by mouth daily with breakfast for 2 days, THEN 2 tablets (20 mg total) daily with breakfast for 2 days, THEN 1 tablet (10 mg total) daily with breakfast for 2 days.   warfarin (COUMADIN ) 5 MG tablet TAKE 1/2 TABLET BY MOUTH DAILY EXCEPT TAKE 1 TABLET ON MONDAY, WEDNESDAY AND FRIDAY OR AS DIRECTED BY  ANTICOAGULATION CLINIC     Allergies:   Avelox [moxifloxacin hcl in nacl], Montelukast sodium, and Pravastatin   Social History   Socioeconomic History   Marital status: Widowed    Spouse name: Not on file   Number of children: Not on file   Years of education: high schoo   Highest education level: 12th grade  Occupational History   Occupation: ADM. ASSISTANT    Employer: [employer]  Tobacco Use   Smoking status: Former    Current packs/day: 0.00    Average packs/day: 1 pack/day for 35.0 years (35.0 ttl pk-yrs)    Types: Cigarettes    Start date: 09/14/1981    Quit date: 09/14/2016    Years since quitting: 6.9    Passive exposure: Past   Smokeless tobacco: Never  Vaping Use   Vaping status: Never Used  Substance and Sexual Activity   Alcohol use: No    Alcohol/week: 0.0 standard drinks of alcohol   Drug use: No   Sexual activity: Not Currently    Birth control/protection: Post-menopausal  Other Topics Concern   Not on file  Social History Narrative   Does not exercise.   Social Drivers of Corporate investment banker Strain: Low Risk  (03/19/2023)   Overall Financial Resource Strain (CARDIA)    Difficulty of Paying Living Expenses: Not very hard  Food Insecurity: Food Insecurity Present (03/19/2023)   Hunger Vital Sign    Worried About Running Out of Food in the Last Year: Sometimes true    Ran Out of Food in the Last Year: Never true  Transportation Needs: No Transportation Needs (03/19/2023)   PRAPARE - Administrator, Civil Service (Medical): No    Lack of Transportation (Non-Medical): No  Physical Activity: Inactive (03/19/2023)   Exercise Vital Sign    Days of Exercise per Week: 0 days    Minutes of Exercise per Session: 10 min  Stress: Stress Concern Present (03/19/2023)   [employer] of Occupational Health - Occupational Stress Questionnaire    Feeling of Stress : To some extent  Social Connections: Moderately Integrated (03/19/2023)   Social  Connection and Isolation Panel    Frequency of Communication with Friends and Family: More than three times a week    Frequency of Social Gatherings with Friends and Family: Once a week    Attends Religious Services: More than 4 times per year    Active Member of [employer] or Organizations: Yes    Attends Banker Meetings: More than 4 times per year  Marital Status: Widowed     Family History: The patient's family history includes Arthritis in her mother; Asthma in her brother; Hyperlipidemia in her mother; Hypertension in her brother and mother; Obesity in her brother; Osteoarthritis in her brother and mother; Rheum arthritis in her mother. ROS:   Please see the history of present illness.    All 14 point review of systems negative except as described per history of present illness  EKGs/Labs/Other Studies Reviewed:         Recent Labs: 08/02/2023: ALT 19 08/15/2023: BUN 16; Creat 1.18; Hemoglobin 12.1; Platelets 187; Potassium 5.1; Sodium 141  Recent Lipid Panel    Component Value Date/Time   CHOL 183 08/02/2023 0905   TRIG 164 (H) 08/02/2023 0905   HDL 37 (L) 08/02/2023 0905   CHOLHDL 4.9 (H) 08/02/2023 0905   CHOLHDL 5 03/26/2023 1244   VLDL 40.2 (H) 03/26/2023 1244   LDLCALC 117 (H) 08/02/2023 0905   LDLDIRECT 165.0 03/03/2020 1141    Physical Exam:    VS:  BP (!) 157/82   Pulse (!) 56   Ht 5' 9 (1.753 m)   Wt 225 lb (102.1 kg)   SpO2 95%   BMI 33.23 kg/m     Wt Readings from Last 3 Encounters:  08/16/23 225 lb (102.1 kg)  05/28/23 240 lb (108.9 kg)  05/15/23 250 lb (113.4 kg)     GEN:  Well nourished, well developed in no acute distress HEENT: Normal NECK: No JVD; No carotid bruits LYMPHATICS: No lymphadenopathy CARDIAC: RRR, no murmurs, no rubs, no gallops RESPIRATORY:  Clear to auscultation without rales, wheezing or rhonchi  ABDOMEN: Soft, non-tender, non-distended MUSCULOSKELETAL:  No edema; No deformity  SKIN: Warm and dry LOWER  EXTREMITIES: no swelling NEUROLOGIC:  Alert and oriented x 3 PSYCHIATRIC:  Normal affect   ASSESSMENT:    1. Elevated coronary artery calcium  score 1969 which is 99th percentile   2. Essential hypertension   3. Mixed hyperlipidemia    PLAN:    In order of problems listed above:  Elevated calcium  score very high I offer her stress test she told me absolutely not apparently she knows 2 people who died during the stress test I told her that this is extremely rare events but she does not want to do it we briefly discussed possibility of coronary CT angio however with this degree of calcification will be difficult to judge the degree of stenosis..  We elected to do simply monitoring the situation with understanding if she develop chest pain tightness squeezing pressure burning chest she need to let me know. Essential hypertension she does have some whitecoat hypertension blood pressure at home always good in the office always elevated.  Continue monitoring. Mixed dyslipidemia I think she would be candidate for PCSK9 agent.  Will refer her to our pharmacy team to talk about this anticipate in the future we hopefully will be able to discontinue Zetia  about the Praluent or Repatha will be reasonable choice   Medication Adjustments/Labs and Tests Ordered: Current medicines are reviewed at length with the patient today.  Concerns regarding medicines are outlined above.  No orders of the defined types were placed in this encounter.  Medication changes: No orders of the defined types were placed in this encounter.   Signed, Lamar DOROTHA Fitch, MD, [facility] 08/16/2023 10:55 AM    [facility]

## 2023-08-16 NOTE — Addendum Note (Signed)
 Addended by: ARLOA PLANAS D on: 08/16/2023 11:09 AM   Modules accepted: Orders

## 2023-08-16 NOTE — Patient Instructions (Signed)
 Medication Instructions:  Your physician recommends that you continue on your current medications as directed. Please refer to the Current Medication list given to you today.  *If you need a refill on your cardiac medications before your next appointment, please call your pharmacy*   Lab Work: None Ordered If you have labs (blood work) drawn today and your tests are completely normal, you will receive your results only by: MyChart Message (if you have MyChart) OR A paper copy in the mail If you have any lab test that is abnormal or we need to change your treatment, we will call you to review the results.   Testing/Procedures: None Ordered   Follow-Up: At [facility], you and your health needs are our priority.  As part of our continuing mission to provide you with exceptional heart care, we have created designated Provider Care Teams.  These Care Teams include your primary Cardiologist (physician) and Advanced Practice Providers (APPs -  Physician Assistants and Nurse Practitioners) who all work together to provide you with the care you need, when you need it.  We recommend signing up for the patient portal called MyChart.  Sign up information is provided on this After Visit Summary.  MyChart is used to connect with patients for Virtual Visits (Telemedicine).  Patients are able to view lab/test results, encounter notes, upcoming appointments, etc.  Non-urgent messages can be sent to your provider as well.   To learn more about what you can do with MyChart, go to [email or web address].    Your next appointment:   6 month(s)  The format for your next appointment:   In Person  Provider:   Lamar Fitch, MD    Other Instructions Referral to Pharm D for Repatha or Praluent therapy

## 2023-08-21 ENCOUNTER — Telehealth: Payer: Self-pay

## 2023-08-21 NOTE — Telephone Encounter (Signed)
 Discussed with Dr. Krasowski at appt 08-16-23

## 2023-09-04 ENCOUNTER — Ambulatory Visit (INDEPENDENT_AMBULATORY_CARE_PROVIDER_SITE_OTHER)

## 2023-09-04 DIAGNOSIS — Z7901 Long term (current) use of anticoagulants: Secondary | ICD-10-CM

## 2023-09-04 LAB — POCT INR: INR: 4.1 — AB (ref 2.0–3.0)

## 2023-09-04 NOTE — Patient Instructions (Addendum)
 Pre visit review using our clinic review tool, if applicable. No additional management support is needed unless otherwise documented below in the visit note.  Hold dose today and then change weekly dose to take 1/2 tablet daily except take 1 tablet on Mondays and Fridays. Recheck in 2 weeks. Contact the coumadin  clinic number at [phone], if any changes or any questions.

## 2023-09-04 NOTE — Progress Notes (Signed)
 Pt reports a decrease in protein intake. This will elevate INR. Pt also had diarrhea x 1 day last week which will also increase INR.  Pt denies any other changes. Advised if any s/s of abnormal bruising or bleeding to go to ER. Pt verbalized understanding.  Hold dose today and then change weekly dose to take 1/2 tablet daily except take 1 tablet on Mondays and Fridays. Recheck in 2 weeks. Contact the coumadin  clinic number at [phone], if any changes or any questions.

## 2023-09-11 ENCOUNTER — Ambulatory Visit: Admitting: Medical

## 2023-09-18 ENCOUNTER — Telehealth: Payer: Self-pay

## 2023-09-18 ENCOUNTER — Ambulatory Visit

## 2023-09-18 NOTE — Telephone Encounter (Signed)
 Pt requesting to RS coumadin  clinic apt due to difficulty getting wheelchair in car with it raining. RS apt for next week. Advised to continue current dosing until next week. Advised if any changes to contact coumadin  clinic. Pt verbalized  understanding.

## 2023-09-20 ENCOUNTER — Ambulatory Visit: Payer: Self-pay | Admitting: Medical

## 2023-09-20 ENCOUNTER — Ambulatory Visit (INDEPENDENT_AMBULATORY_CARE_PROVIDER_SITE_OTHER): Admitting: Medical

## 2023-09-20 ENCOUNTER — Encounter: Payer: Self-pay | Admitting: Medical

## 2023-09-20 VITALS — BP 136/70 | HR 54 | Temp 98.0°F | Resp 16 | Ht 69.0 in | Wt 230.0 lb

## 2023-09-20 DIAGNOSIS — E785 Hyperlipidemia, unspecified: Secondary | ICD-10-CM | POA: Diagnosis not present

## 2023-09-20 DIAGNOSIS — L84 Corns and callosities: Secondary | ICD-10-CM | POA: Diagnosis not present

## 2023-09-20 DIAGNOSIS — Z86718 Personal history of other venous thrombosis and embolism: Secondary | ICD-10-CM

## 2023-09-20 DIAGNOSIS — R944 Abnormal results of kidney function studies: Secondary | ICD-10-CM | POA: Diagnosis not present

## 2023-09-20 DIAGNOSIS — M2012 Hallux valgus (acquired), left foot: Secondary | ICD-10-CM | POA: Diagnosis not present

## 2023-09-20 DIAGNOSIS — I10 Essential (primary) hypertension: Secondary | ICD-10-CM | POA: Diagnosis not present

## 2023-09-20 DIAGNOSIS — F419 Anxiety disorder, unspecified: Secondary | ICD-10-CM

## 2023-09-20 LAB — LIPID PANEL
Cholesterol: 171 mg/dL (ref 0–200)
HDL: 36.6 mg/dL — ABNORMAL LOW (ref >39.00)
LDL Cholesterol: 101 mg/dL — ABNORMAL HIGH (ref 0–99)
NonHDL: 134.72
Total CHOL/HDL Ratio: 5
Triglycerides: 169 mg/dL — ABNORMAL HIGH (ref 0.0–149.0)
VLDL: 33.8 mg/dL (ref 0.0–40.0)

## 2023-09-20 NOTE — Progress Notes (Signed)
   Subjective:    Patient ID: LACI FRENKEL, female    DOB: [birth year], 68 y.o.   MRN: [id number]  HPI  Rita Watkins is a 68 year old female who presents for a six-month follow-up for medication management.  She is here for a six-month follow-up to manage her anxiety medication, Xanax , which she takes twice daily as needed. Her GAD-7 score is 12 when not on Xanax . No symptoms of depression.  She is on Zetia  10 mg daily for high cholesterol. There is a significant improvement in her cholesterol levels, with total cholesterol decreasing from 265 to 196 and LDL at 119. She has a history of muscle cramps with statins, specifically pravastatin, which led to discontinuation of that medication. She is willing to use statin try again. Willing to try lower dose and different statin if needed. Repatha was too  expensive per pt.  She mentions a concern about her right foot, describing severe sensitivity and pain when touched.  She is on Coumadin  for a history of DVTs and regularly visits the Coumadin  clinic. No issues with blood sugar levels. States eliquis was too expensive.    Review of Systems See hpi    Objective:   Physical Exam  General- No acute distress. Pleasant patient. Neck- Full range of motion, no jvd Lungs- Clear, even and unlabored. Heart- regular rate and rhythm. Neurologic- CNII- XII grossly intact.  Left lower ext- above the knee amputation. Rt lower ext- rt great toe medial aspect corn vs callous. Tender to light touch. No redness or warmth.      Assessment & Plan:   Patient Instructions  Right foot corn or callus with hallux valgus Significant pain due to corn or callus, possibly worsened by hallux valgus. - Refer to podiatrist for evaluation and management.  Generalized anxiety disorder Well-controlled with Xanax , taken up to twice daily as needed. GAD-7 score of 12 off medication indicates moderate anxiety. - Verify current Xanax  prescription refills and request  refill if necessary.  Hyperlipidemia with statin intolerance Managed with Zetia  10 mg daily due to statin intolerance. Total cholesterol decreased from 265 to 196, LDL is 119. Repatha not feasible due to cost and insurance. - Continue Zetia  10 mg daily. - Review lipid panel results from today's blood work. - Consider trial of low-dose atorvastatin  on Monday, Wednesday, Friday if lipid panel indicates need as we discussed with appropriate precaution.  Hypertension Controlled with Lotensin  20 mg daily. Current blood pressure is 136/70.  Chronic kidney disease, stage 3a Stage 3a with GFR of 56 and creatinine of 1.07. - Review kidney function results from today's metabolic panel.  Deep vein thrombosis on chronic anticoagulation Managed with Coumadin , regular monitoring at Coumadin  clinic.  Follow up feb 2026 or sooner if needed based    Dallas Maxwell, PA-C

## 2023-09-20 NOTE — Patient Instructions (Signed)
 Right foot corn or callus with hallux valgus Significant pain due to corn or callus, possibly worsened by hallux valgus. - Refer to podiatrist for evaluation and management.  Generalized anxiety disorder Well-controlled with Xanax , taken up to twice daily as needed. GAD-7 score of 12 off medication indicates moderate anxiety. - Verify current Xanax  prescription refills and request refill if necessary.  Hyperlipidemia with statin intolerance Managed with Zetia  10 mg daily due to statin intolerance. Total cholesterol decreased from 265 to 196, LDL is 119. Repatha not feasible due to cost and insurance. - Continue Zetia  10 mg daily. - Review lipid panel results from today's blood work. - Consider trial of low-dose atorvastatin  on Monday, Wednesday, Friday if lipid panel indicates need as we discussed with appropriate precaution.  Hypertension Controlled with Lotensin  20 mg daily. Current blood pressure is 136/70.  Chronic kidney disease, stage 3a Stage 3a with GFR of 56 and creatinine of 1.07. - Review kidney function results from today's metabolic panel.  Deep vein thrombosis on chronic anticoagulation Managed with Coumadin , regular monitoring at Coumadin  clinic.  Follow up feb 2026 or sooner if needed based

## 2023-09-21 LAB — COMPLETE METABOLIC PANEL WITHOUT GFR
AG Ratio: 1.3 (calc) (ref 1.0–2.5)
ALT: 14 U/L (ref 6–29)
AST: 22 U/L (ref 10–35)
Albumin: 3.7 g/dL (ref 3.6–5.1)
Alkaline phosphatase (APISO): 89 U/L (ref 37–153)
BUN: 12 mg/dL (ref 7–25)
CO2: 31 mmol/L (ref 20–32)
Calcium: 8.8 mg/dL (ref 8.6–10.4)
Chloride: 105 mmol/L (ref 98–110)
Creat: 0.93 mg/dL (ref 0.50–1.05)
Globulin: 2.8 g/dL (ref 1.9–3.7)
Glucose, Bld: 89 mg/dL (ref 65–99)
Potassium: 4.6 mmol/L (ref 3.5–5.3)
Sodium: 141 mmol/L (ref 135–146)
Total Bilirubin: 0.5 mg/dL (ref 0.2–1.2)
Total Protein: 6.5 g/dL (ref 6.1–8.1)

## 2023-09-21 MED ORDER — ATORVASTATIN CALCIUM 10 MG PO TABS: ORAL_TABLET | ORAL | 3 refills | Status: AC

## 2023-09-21 MED ORDER — ATORVASTATIN CALCIUM 10 MG PO TABS: 10.0000 mg | ORAL_TABLET | Freq: Every day | ORAL | 0 refills | Status: DC

## 2023-09-21 NOTE — Addendum Note (Signed)
 Addended by: DORINA DALLAS HERO on: 09/21/2023 09:32 AM   Modules accepted: Orders

## 2023-09-24 ENCOUNTER — Other Ambulatory Visit: Payer: Self-pay | Admitting: Podiatry

## 2023-09-24 ENCOUNTER — Ambulatory Visit (INDEPENDENT_AMBULATORY_CARE_PROVIDER_SITE_OTHER)

## 2023-09-24 ENCOUNTER — Ambulatory Visit: Admitting: Podiatry

## 2023-09-24 DIAGNOSIS — L539 Erythematous condition, unspecified: Secondary | ICD-10-CM | POA: Diagnosis not present

## 2023-09-24 DIAGNOSIS — L989 Disorder of the skin and subcutaneous tissue, unspecified: Secondary | ICD-10-CM | POA: Diagnosis not present

## 2023-09-24 DIAGNOSIS — L03031 Cellulitis of right toe: Secondary | ICD-10-CM

## 2023-09-24 DIAGNOSIS — L84 Corns and callosities: Secondary | ICD-10-CM

## 2023-09-24 DIAGNOSIS — Z89612 Acquired absence of left leg above knee: Secondary | ICD-10-CM

## 2023-09-24 DIAGNOSIS — L02611 Cutaneous abscess of right foot: Secondary | ICD-10-CM

## 2023-09-24 MED ORDER — MUPIROCIN 2 % EX OINT: 1.0000 | TOPICAL_OINTMENT | Freq: Two times a day (BID) | CUTANEOUS | 2 refills | Status: DC

## 2023-09-24 MED ORDER — DOXYCYCLINE HYCLATE 100 MG PO TABS: 100.0000 mg | ORAL_TABLET | Freq: Two times a day (BID) | ORAL | 0 refills | Status: DC

## 2023-09-25 ENCOUNTER — Ambulatory Visit (INDEPENDENT_AMBULATORY_CARE_PROVIDER_SITE_OTHER)

## 2023-09-25 DIAGNOSIS — Z7901 Long term (current) use of anticoagulants: Secondary | ICD-10-CM

## 2023-09-25 LAB — POCT INR: INR: 2.8 (ref 2.0–3.0)

## 2023-09-25 NOTE — Progress Notes (Signed)
 Pt started atorvastatin , 10 mg, 3 days per wk, and has only taken one tablet so far. Pt started doxycycline  this morning and also mupirocin  cream, 2%. Statins can interact with warfarin and increase INR. Pt is only taking this 3 x wk so interaction may not occur. Doxycycline  has a potential interaction.  Pt reports she missed a dose one week ago.  Continue 1/2 tablet daily except take 1 tablet on Mondays and Fridays. Recheck in 2 weeks. Contact the coumadin  clinic number at [phone], if any changes or any questions.

## 2023-09-25 NOTE — Patient Instructions (Addendum)
 Pre visit review using our clinic review tool, if applicable. No additional management support is needed unless otherwise documented below in the visit note.  Continue 1/2 tablet daily except take 1 tablet on Mondays and Fridays. Recheck in 2 weeks. Contact the coumadin  clinic number at [phone], if any changes or any questions.

## 2023-09-26 NOTE — Progress Notes (Signed)
 Subjective:   Patient ID: Rita Watkins, female   DOB: 68 y.o.   MRN: [id number]   HPI Chief Complaint  Patient presents with   Toe Pain    Pt stated that she has been having some discomfort in her right big toe she stated that it is off and on    68 year old female presents the office today with concerns of ongoing pain to her right big toe that is been intermittent.  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  She denies any recent treatment for the right big toe.  She does not recall any injuries.  No drainage or pus.  She points to along the area of the skin lesion on the medial aspect of the big toe.  Does not report any fevers or chills.   Review of Systems  All other systems reviewed and are negative.  Past Medical History:  Diagnosis Date   Allergy    Anemia associated with acute blood loss 08/22/2016   Formatting of this note is different from the original.  CT abdomen 7/17:   1.  Large left retroperitoneal hematoma with mixed density blood products.  Assessment for active bleeding is not possible without IV contrast. 2.  Contrast in the renal collecting systems presumably from contrast enhanced studies performed on August 20, 2016 consistent with known impaired renal function.   CT abdomen 7/27:    Ankle swelling, right 10/06/2021   Anxiety    Benign essential HTN 08/14/2016   Cardiac murmur 04/20/2020   COPD (chronic obstructive pulmonary disease) (HCC)    COPD (chronic obstructive pulmonary disease) (HCC)    Deep vein thrombosis (DVT) of popliteal vein of both lower extremities (HCC) 08/18/2016   Last Assessment & Plan:  Formatting of this note might be different from the original. Not clear if provoked/unprovoked (pt had trauma to left toe back in 03/2016 with some decreased mobility thereafter) CT chest confirmed acute PE and also concerning for some bilateral upper lobe groundglass opacities Hematology on board and heparin drip started but   discontinued 7/17  2/2 large retroperitoneal he   Depression    Depression with anxiety 09/13/2012   Dizziness 07/19/2016   Essential hypertension 04/20/2020   High risk medication use 03/16/2022   History of DVT (deep vein thrombosis) 05/15/2023   History of pulmonary embolism 04/20/2020   Hyperlipidemia    Hypertension    Leukocytosis 07/30/2014   Locking finger joint 09/18/2022   Lymphangitis 06/07/2016   Mycobacterial infection 06/07/2016   Mycobacterial infection 06/07/2016   Mycobacterium infection, 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 Per dermatology: Please provide the patient with our outpatient clinic phone number [phone]) to call and schedule a follow up appointment for continued management.   Last Assessment & Plan:  Formatting of this note might be different from the original. Uncertain etiology Vasculitic vs infectious vs malignancy Extensive work up has   Obesity (BMI 30.0-34.9) 04/20/2020   Open leg wound, left, sequela 08/17/2016   Formatting of this note might be different from the original. - Chronic left wound starting in April 2018 and worsening despite antibiotics and debridement -Patient had multiple hospitalizations and was treated with steroids and antibiotics with worsening/extension of the wound - ESR 103, CRP 168 - ANA, factor V Leyden, Antithrombin III , Cardiolite and antibody IgG, C3 and C4 complement  proteinase   Peripheral vascular disease (HCC) 08/19/2016   Last Assessment & Plan:  Formatting of this note might be different from the original. Bilateral popliteal DVT seen on admission Occlusion of the left common iliac with collateral blood flow to common femoral artery LLE angiogram on 08/22/16 for attempt at revascularization to optimize blood flow for healing Left AKA 7/24 I&D 7/31 and 8/2 with further procedures  planned.  ASA 81 mg daily PT consult   Pulmonary embolism without acute cor pulmonale (HCC) 08/18/2016   Last Assessment & Plan:  Formatting of this note might be different from the original. Uncertain etiology  Was on heparin gtt but discontinued 2/2 large retroperitoneal hematoma (now stable on follow up CT) O2 PRN IVC placed  Vascular following No SOB, SpO2 93% on room air On warfarin, on hold due to supratherapeutic INR, will restart at 3mg  once able   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   Sedimentation rate elevation 10/06/2021   Seropositive rheumatoid arthritis (HCC) 10/06/2021   Surgical wound, non healing 08/16/2016   Formatting of this note might be different from the original. Added automatically from request for surgery [id number]  Last Assessment & Plan:  Formatting of this note might be different from the original. Presented with large area of necrotic tissue LLE Bilateral popliteal DVT Etiology uncertain:  Ischemic vs vasculitis vs autoimmune vs possible occult malignancy Bx: leukocytoblastic vasculopathy  In   Tobacco use 08/18/2016   Last Assessment & Plan:  Formatting of this note might be different from the original. Patient has a 30+ pack year history of smoking Discussed low dose CT chest for cancer screening at admission, patient open to this but concerned about extra cost as she has no insurance at this time. No obvious suspicious lesions noted on CTA PE study.  Counseled regarding tobacco cessation    Past Surgical History:  Procedure Laterality Date   FOOT SURGERY     Left   HIP PINNING     3 each hip   IRRIGATION AND DEBRIDEMENT ABSCESS N/A 06/27/2016   Procedure: IRRIGATION AND DEBRIDEMENT LEFT LEG WITH SOTFT TISSUE BIOPSY;  Surgeon: Signe Mitzie LABOR, MD;  Location: [facility];  Service: General;  Laterality: N/A;   LEG AMPUTATION Left    LYMPH NODE BIOPSY Left 06/09/2016   Procedure: BIOPSY LEFT LEG;  Surgeon: Lowery Estefana RAMAN, DO;  Location: [facility];  Service: Plastics;   Laterality: Left;   SLIPPED CAPITAL FEMORAL EPIPHYSIS PINNING     teeth removal  03/2023     Current Outpatient Medications:    doxycycline  (VIBRA -TABS) 100 MG tablet, Take 1 tablet (100 mg total) by mouth 2 (two) times daily., Disp: 20 tablet, Rfl: 0   mupirocin  ointment (BACTROBAN ) 2 %, Apply 1 Application topically 2 (two) times daily., Disp: 30 g, Rfl: 2   albuterol  (VENTOLIN  HFA) 108 (90 Base) MCG/ACT inhaler, Inhale 2 puffs into the lungs every 6 (six) hours as needed., Disp: 6.7 g, Rfl: 0   ALPRAZolam  (XANAX ) 0.5 MG tablet, TAKE 1 TABLET BY MOUTH 2 TIMES DAILY AS NEEDED FOR ANXIETY., Disp: 60 tablet, Rfl: 3   ALPRAZolam  (XANAX ) 0.5 MG tablet, Take 1 tablet (0.5 mg total) by mouth 2 (two) times daily as needed for anxiety., Disp: 60 tablet, Rfl: 1   amLODipine  (NORVASC ) 2.5 MG tablet, TAKE 1 TABLET BY MOUTH EVERY DAY, Disp: 90 tablet, Rfl: 1   atorvastatin  (LIPITOR) 10 MG tablet, 1 tab po Monday wed and friday, Disp: 12 tablet, Rfl: 3  benazepril  (LOTENSIN ) 20 MG tablet, Take 1 tablet (20 mg total) by mouth 2 (two) times daily., Disp: 180 tablet, Rfl: 3   ezetimibe  (ZETIA ) 10 MG tablet, Take 1 tablet (10 mg total) by mouth daily., Disp: 90 tablet, Rfl: 3   folic acid  (FOLVITE ) 1 MG tablet, Take 1 tablet (1 mg total) by mouth daily. (While on methotrexate  therapy), Disp: 90 tablet, Rfl: 0   gabapentin  (NEURONTIN ) 300 MG capsule, Take 1 capsule (300 mg total) by mouth at bedtime., Disp: 90 capsule, Rfl: 3   methotrexate  (RHEUMATREX) 2.5 MG tablet, Take 6 tablets (15 mg total) by mouth once a week. Caution: Chemotherapy. Protect from light., Disp: 72 tablet, Rfl: 0   warfarin (COUMADIN ) 5 MG tablet, TAKE 1/2 TABLET BY MOUTH DAILY EXCEPT TAKE 1 TABLET ON MONDAY, WEDNESDAY AND FRIDAY OR AS DIRECTED BY ANTICOAGULATION CLINIC, Disp: 80 tablet, Rfl: 1  Allergies  Allergen Reactions   Avelox [Moxifloxacin Hcl In Nacl] Anaphylaxis   Montelukast Sodium Other (See Comments)    Insomnia    Pravastatin Other (See Comments)    Myalgias on 20mg           Objective:  Physical Exam  General: AAO x3, NAD- in wheelchair  Dermatological: On the medial aspect the right hallux is a punctate annular hyperkeratotic lesion.  Upon debridement of this I was not able to visualize any foreign objects or any ulceration.  There is localized edema erythema but there is no ascending cellulitis.  No fluctuation or crepitation.  There is no malodor.  Vascular: Dorsalis Pedis artery and Posterior Tibial artery pedal pulses are decreased on the right.  There is no pain with calf compression.   Neruologic: Grossly intact via light touch.  Musculoskeletal: Tenderness palpation on the area the skin lesion on the right hallux.  Gait: Unassisted, Nonantalgic.       Assessment:   Skin lesion right foot; PAD     Plan:  -Treatment options discussed including all alternatives, risks, and complications -Etiology of symptoms were discussed -X-rays were obtained and reviewed with the patient.  3 views right foot were obtained.  No evidence of acute fracture or foreign object identified.  Arthritic is present.  Osteopenia noted. -Patient debrided hyperkeratotic lesion with any complications or bleeding.  Not able to visualize a foreign object today or any ulceration.  I did not want to debride too deep given her history of.  I ordered ABI today.  Prescribe doxycycline .  Discussed mupirocin  images images daily.  Offloading. -Monitor for any clinical signs or symptoms of infection and directed to call the office immediately should any occur or go to the ER.  Return in about 3 weeks (around 10/15/2023).  Donnice JONELLE Fees DPM

## 2023-10-02 ENCOUNTER — Encounter: Payer: Self-pay | Admitting: Medical

## 2023-10-02 ENCOUNTER — Ambulatory Visit (INDEPENDENT_AMBULATORY_CARE_PROVIDER_SITE_OTHER)

## 2023-10-02 DIAGNOSIS — Z7901 Long term (current) use of anticoagulants: Secondary | ICD-10-CM | POA: Diagnosis not present

## 2023-10-02 LAB — POCT INR: INR: 3.7 — AB (ref 2.0–3.0)

## 2023-10-02 NOTE — Patient Instructions (Addendum)
 Pre visit review using our clinic review tool, if applicable. No additional management support is needed unless otherwise documented below in the visit note.  Hold dose today and then change weekly dose to take 1/2 tablet daily except take 1 tablet Fridays. Recheck in 3 weeks. Contact the coumadin  clinic number at [phone], if any changes or any questions.

## 2023-10-02 NOTE — Telephone Encounter (Signed)
 Requesting: xanax  Contract:03/26/23 UDS:03/26/23 Last Visit:09/20/23 Next Visit:10/18/23 Last Refill:07/24/23  Please Advise

## 2023-10-02 NOTE — Progress Notes (Signed)
 Pt started atorvastatin , 10 mg, 3 days per wk, about one week ago. Statins can interact with warfarin and increase INR. Pt is only taking this 3 x wk so interaction may not occur. Pt was placed on doxycycline  on 8/18 for an abscess. This has a potential interaction. Hold dose today and then change weekly dose to take 1/2 tablet daily except take 1 tablet Fridays. Recheck in 3 weeks. Contact the coumadin  clinic number at [phone], if any changes or any questions.

## 2023-10-03 MED ORDER — ALPRAZOLAM 0.5 MG PO TABS: 0.5000 mg | ORAL_TABLET | Freq: Two times a day (BID) | ORAL | 1 refills | Status: DC | PRN

## 2023-10-03 NOTE — Telephone Encounter (Signed)
Rx refill sent to pt pharmacy 

## 2023-10-07 ENCOUNTER — Other Ambulatory Visit: Payer: Self-pay | Admitting: Podiatry

## 2023-10-07 DIAGNOSIS — I739 Peripheral vascular disease, unspecified: Secondary | ICD-10-CM

## 2023-10-07 DIAGNOSIS — L03031 Cellulitis of right toe: Secondary | ICD-10-CM

## 2023-10-07 DIAGNOSIS — L989 Disorder of the skin and subcutaneous tissue, unspecified: Secondary | ICD-10-CM

## 2023-10-07 DIAGNOSIS — Z89612 Acquired absence of left leg above knee: Secondary | ICD-10-CM

## 2023-10-18 ENCOUNTER — Ambulatory Visit (INDEPENDENT_AMBULATORY_CARE_PROVIDER_SITE_OTHER)

## 2023-10-18 DIAGNOSIS — Z0000 Encounter for general adult medical examination without abnormal findings: Secondary | ICD-10-CM | POA: Diagnosis not present

## 2023-10-18 NOTE — Patient Instructions (Signed)
 [name],  Thank you for taking the time for your Medicare Wellness Visit. I appreciate your continued commitment to your health goals. Please review the care plan we discussed, and feel free to reach out if I can assist you further.  Medicare recommends these wellness visits once per year to help you and your care team stay ahead of potential health issues. These visits are designed to focus on prevention, allowing your provider to concentrate on managing your acute and chronic conditions during your regular appointments.  Please note that Annual Wellness Visits do not include a physical exam. Some assessments may be limited, especially if the visit was conducted virtually. If needed, we may recommend a separate in-person follow-up with your provider.  Ongoing Care Seeing your primary care provider every 3 to 6 months helps us  monitor your health and provide consistent, personalized care.   Referrals If a referral was made during today's visit and you haven't received any updates within two weeks, please contact the referred provider directly to check on the status.  Recommended Screenings:  Health Maintenance  Topic Date Due   Zoster (Shingles) Vaccine (1 of 2) Never done   Cologuard (Stool DNA test)  Never done   Screening for Lung Cancer  Never done   Breast Cancer Screening  02/06/2010   DTaP/Tdap/Td vaccine (2 - Td or Tdap) 04/10/2018   DEXA scan (bone density measurement)  Never done   Medicare Annual Wellness Visit  10/17/2024   Pneumococcal Vaccine for age over 19  Completed   Flu Shot  Completed   Hepatitis C Screening  Completed   HPV Vaccine  Aged Out   Meningitis B Vaccine  Aged Out   Colon Cancer Screening  Discontinued   COVID-19 Vaccine  Discontinued       10/18/2023   10:53 AM  Advanced Directives  Does Patient Have a Medical Advance Directive? Yes  Type of Estate agent of [place];Living will  Copy of Healthcare Power of Attorney in  Chart? No - copy requested   Advance Care Planning is important because it: Ensures you receive medical care that aligns with your values, goals, and preferences. Provides guidance to your family and loved ones, reducing the emotional burden of decision-making during critical moments.  Vision: Annual vision screenings are recommended for early detection of glaucoma, cataracts, and diabetic retinopathy. These exams can also reveal signs of chronic conditions such as diabetes and high blood pressure.  Dental: Annual dental screenings help detect early signs of oral cancer, gum disease, and other conditions linked to overall health, including heart disease and diabetes.  Please see the attached documents for additional preventive care recommendations.

## 2023-10-18 NOTE — Progress Notes (Signed)
 Subjective:   Rita Watkins is a 68 y.o. who presents for a Medicare Wellness preventive visit.  As a reminder, Annual Wellness Visits don't include a physical exam, and some assessments may be limited, especially if this visit is performed virtually. We may recommend an in-person follow-up visit with your provider if needed.  Visit Complete: Virtual I connected with  Rita Watkins on 10/18/23 by a audio enabled telemedicine application and verified that I am speaking with the correct person using two identifiers.  Patient Location: Home  Provider Location: Home Office  I discussed the limitations of evaluation and management by telemedicine. The patient expressed understanding and agreed to proceed.  Vital Signs: Because this visit was a virtual/telehealth visit, some criteria may be missing or patient reported. Any vitals not documented were not able to be obtained and vitals that have been documented are patient reported.  VideoError- Librarian, academic were attempted between this provider and patient, however failed, due to patient having technical difficulties OR patient did not have access to video capability.  We continued and completed visit with audio only.   Persons Participating in Visit: Patient.  AWV Questionnaire: Yes: Patient Medicare AWV questionnaire was completed by the patient on 10/11/2023; I have confirmed that all information answered by patient is correct and no changes since this date.  Cardiac Risk Factors include: advanced age (>25men, >32 women);dyslipidemia;hypertension     Objective:    Today's Vitals   There is no height or weight on file to calculate BMI.     10/18/2023   10:53 AM 05/28/2023    1:07 PM 10/17/2022   10:26 AM 10/12/2021    1:12 PM 10/06/2020   11:42 AM 05/18/2020    3:37 PM 05/23/2019    2:08 PM  Advanced Directives  Does Patient Have a Medical Advance Directive? Yes No Yes Yes Yes Yes No  Type of Sports coach of [place];Living will Living will;Out of facility DNR (pink MOST or yellow form) Healthcare Power of [place];Living will;Out of facility DNR (pink MOST or yellow form) Out of facility DNR (pink MOST or yellow form);Healthcare Power of [place];Living will Healthcare Power of [place];Living will Living will   Does patient want to make changes to medical advance directive?  No - Patient declined No - Patient declined      Copy of Healthcare Power of Attorney in Chart? No - copy requested No - copy requested No - copy requested No - copy requested No - copy requested No - copy requested   Would patient like information on creating a medical advance directive?  No - Patient declined         Current Medications (verified) Outpatient Encounter Medications as of 10/18/2023  Medication Sig   albuterol  (VENTOLIN  HFA) 108 (90 Base) MCG/ACT inhaler Inhale 2 puffs into the lungs every 6 (six) hours as needed.   ALPRAZolam  (XANAX ) 0.5 MG tablet TAKE 1 TABLET BY MOUTH 2 TIMES DAILY AS NEEDED FOR ANXIETY.   ALPRAZolam  (XANAX ) 0.5 MG tablet Take 1 tablet (0.5 mg total) by mouth 2 (two) times daily as needed for anxiety.   amLODipine  (NORVASC ) 2.5 MG tablet TAKE 1 TABLET BY MOUTH EVERY DAY   atorvastatin  (LIPITOR) 10 MG tablet 1 tab po Monday wed and friday   benazepril  (LOTENSIN ) 20 MG tablet Take 1 tablet (20 mg total) by mouth 2 (two) times daily.   ezetimibe  (ZETIA ) 10 MG tablet Take 1 tablet (10 mg total) by  mouth daily.   folic acid  (FOLVITE ) 1 MG tablet Take 1 tablet (1 mg total) by mouth daily. (While on methotrexate  therapy)   gabapentin  (NEURONTIN ) 300 MG capsule Take 1 capsule (300 mg total) by mouth at bedtime.   methotrexate  (RHEUMATREX) 2.5 MG tablet Take 6 tablets (15 mg total) by mouth once a week. Caution: Chemotherapy. Protect from light.   mupirocin  ointment (BACTROBAN ) 2 % Apply 1 Application topically 2 (two) times daily.   warfarin (COUMADIN ) 5 MG tablet TAKE  1/2 TABLET BY MOUTH DAILY EXCEPT TAKE 1 TABLET ON MONDAY, WEDNESDAY AND FRIDAY OR AS DIRECTED BY ANTICOAGULATION CLINIC   doxycycline  (VIBRA -TABS) 100 MG tablet Take 1 tablet (100 mg total) by mouth 2 (two) times daily. (Patient not taking: Reported on 10/18/2023)   [DISCONTINUED] DULoxetine  (CYMBALTA ) 30 MG capsule Take 1 capsule (30 mg total) by mouth daily. (Patient not taking: Reported on 05/14/2023)   No facility-administered encounter medications on file as of 10/18/2023.    Allergies (verified) Avelox [moxifloxacin hcl in nacl], Montelukast sodium, and Pravastatin   History: Past Medical History:  Diagnosis Date   Allergy    Anemia associated with acute blood loss 08/22/2016   Formatting of this note is different from the original.  CT abdomen 7/17:   1.  Large left retroperitoneal hematoma with mixed density blood products.  Assessment for active bleeding is not possible without IV contrast. 2.  Contrast in the renal collecting systems presumably from contrast enhanced studies performed on August 20, 2016 consistent with known impaired renal function.   CT abdomen 7/27:    Ankle swelling, right 10/06/2021   Anxiety    Benign essential HTN 08/14/2016   Cardiac murmur 04/20/2020   COPD (chronic obstructive pulmonary disease) (HCC)    COPD (chronic obstructive pulmonary disease) (HCC)    Deep vein thrombosis (DVT) of popliteal vein of both lower extremities (HCC) 08/18/2016   Last Assessment & Plan:  Formatting of this note might be different from the original. Not clear if provoked/unprovoked (pt had trauma to left toe back in 03/2016 with some decreased mobility thereafter) CT chest confirmed acute PE and also concerning for some bilateral upper lobe groundglass opacities Hematology on board and heparin drip started but  discontinued 7/17  2/2 large retroperitoneal he   Depression    Depression with anxiety 09/13/2012   Dizziness 07/19/2016   Essential hypertension 04/20/2020   High risk  medication use 03/16/2022   History of DVT (deep vein thrombosis) 05/15/2023   History of pulmonary embolism 04/20/2020   Hyperlipidemia    Hypertension    Leukocytosis 07/30/2014   Locking finger joint 09/18/2022   Lymphangitis 06/07/2016   Mycobacterial infection 06/07/2016   Mycobacterial infection 06/07/2016   Mycobacterium infection, 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 Per dermatology: Please provide the patient with our outpatient clinic phone number [phone]) to call and schedule a follow up appointment for continued management.   Last Assessment & Plan:  Formatting of this note might be different from the original. Uncertain etiology Vasculitic vs infectious vs malignancy Extensive work up has   Obesity (BMI 30.0-34.9) 04/20/2020   Open leg wound, left, sequela 08/17/2016   Formatting of this note might be different from the original. - Chronic left wound starting in April 2018 and worsening despite antibiotics and debridement -Patient had multiple hospitalizations and was treated with steroids  and antibiotics with worsening/extension of the wound - ESR 103, CRP 168 - ANA, factor V Leyden, Antithrombin III , Cardiolite and antibody IgG, C3 and C4 complement proteinase   Peripheral vascular disease (HCC) 08/19/2016   Last Assessment & Plan:  Formatting of this note might be different from the original. 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  ASA 81 mg daily PT consult   Pulmonary embolism without acute cor pulmonale (HCC) 08/18/2016   Last Assessment & Plan:  Formatting of this note might be different  from the original. Uncertain etiology  Was on heparin gtt but discontinued 2/2 large retroperitoneal hematoma (now stable on follow up CT) O2 PRN IVC placed  Vascular following No SOB, SpO2 93% on room air On warfarin, on hold due to supratherapeutic INR, will restart at 3mg  once able   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   Sedimentation rate elevation 10/06/2021   Seropositive rheumatoid arthritis (HCC) 10/06/2021   Surgical wound, non healing 08/16/2016   Formatting of this note might be different from the original. Added automatically from request for surgery [id number]  Last Assessment & Plan:  Formatting of this note might be different from the original. Presented with large area of necrotic tissue LLE Bilateral popliteal DVT Etiology uncertain:  Ischemic vs vasculitis vs autoimmune vs possible occult malignancy Bx: leukocytoblastic vasculopathy  In   Tobacco use 08/18/2016   Last Assessment & Plan:  Formatting of this note might be different from the original. Patient has a 30+ pack year history of smoking Discussed low dose CT chest for cancer screening at admission, patient open to this but concerned about extra cost as she has no insurance at this time. No obvious suspicious lesions noted on CTA PE study.  Counseled regarding tobacco cessation   Past Surgical History:  Procedure Laterality Date   FOOT SURGERY     Left   HIP PINNING     3 each hip   IRRIGATION AND DEBRIDEMENT ABSCESS N/A 06/27/2016   Procedure: IRRIGATION AND DEBRIDEMENT LEFT LEG WITH SOTFT TISSUE BIOPSY;  Surgeon: Signe Mitzie LABOR, MD;  Location: [facility];  Service: General;  Laterality: N/A;   LEG AMPUTATION Left    LYMPH NODE BIOPSY Left 06/09/2016   Procedure: BIOPSY LEFT LEG;  Surgeon: Lowery Estefana RAMAN, DO;  Location: [facility];  Service: Plastics;  Laterality: Left;   SLIPPED CAPITAL FEMORAL EPIPHYSIS PINNING     teeth removal  03/2023   Family History  Problem Relation Age of Onset   Hypertension Mother     Hyperlipidemia Mother    Osteoarthritis Mother    Rheum arthritis Mother    Arthritis Mother    Hypertension Brother    Asthma Brother    Osteoarthritis Brother    Obesity Brother    Social History   Socioeconomic History   Marital status: Widowed    Spouse name: Not on file   Number of children: Not on file   Years of education: high schoo   Highest education level: 12th grade  Occupational History   Occupation: ADM. ASSISTANT    Employer: [employer]  Tobacco Use   Smoking status: Former    Current packs/day: 0.00    Average packs/day: 1 pack/day for 35.0 years (35.0 ttl pk-yrs)    Types: Cigarettes  Start date: 09/14/1981    Quit date: 09/14/2016    Years since quitting: 7.0    Passive exposure: Past   Smokeless tobacco: Never  Vaping Use   Vaping status: Never Used  Substance and Sexual Activity   Alcohol use: No    Alcohol/week: 0.0 standard drinks of alcohol   Drug use: No   Sexual activity: Not Currently    Birth control/protection: Post-menopausal  Other Topics Concern   Not on file  Social History Narrative   Does not exercise.   Social Drivers of Corporate investment banker Strain: Low Risk  (10/18/2023)   Overall Financial Resource Strain (CARDIA)    Difficulty of Paying Living Expenses: Not hard at all  Food Insecurity: No Food Insecurity (10/18/2023)   Hunger Vital Sign    Worried About Running Out of Food in the Last Year: Never true    Ran Out of Food in the Last Year: Never true  Transportation Needs: No Transportation Needs (10/18/2023)   PRAPARE - Administrator, Civil Service (Medical): No    Lack of Transportation (Non-Medical): No  Recent Concern: Transportation Needs - Unmet Transportation Needs (09/13/2023)   PRAPARE - Transportation    Lack of Transportation (Medical): Yes    Lack of Transportation (Non-Medical): Yes  Physical Activity: Inactive (10/18/2023)   Exercise Vital Sign    Days of Exercise per Week: 0 days    Minutes of  Exercise per Session: 0 min  Stress: No Stress Concern Present (10/18/2023)   [employer] of Occupational Health - Occupational Stress Questionnaire    Feeling of Stress: Only a little  Social Connections: Moderately Integrated (10/18/2023)   Social Connection and Isolation Panel    Frequency of Communication with Friends and Family: More than three times a week    Frequency of Social Gatherings with Friends and Family: More than three times a week    Attends Religious Services: More than 4 times per year    Active Member of [employer] or Organizations: Yes    Attends Banker Meetings: More than 4 times per year    Marital Status: Widowed    Tobacco Counseling Counseling given: Not Answered    Clinical Intake:  Pre-visit preparation completed: Yes  Pain : No/denies pain     Nutritional Risks: None Diabetes: No  Lab Results  Component Value Date   HGBA1C 5.7 07/31/2022   HGBA1C 5.6 03/23/2021   HGBA1C 5.5 03/03/2020     How often do you need to have someone help you when you read instructions, pamphlets, or other written materials from your doctor or pharmacy?: 1 - Never  Interpreter Needed?: No  Information entered by :: NAllen LPN   Activities of Daily Living     10/11/2023    8:59 AM  In your present state of health, do you have any difficulty performing the following activities:  Hearing? 0  Vision? 0  Difficulty concentrating or making decisions? 0  Walking or climbing stairs? 1  Comment in wheel chair  Dressing or bathing? 0  Doing errands, shopping? 0  Preparing Food and eating ? N  Using the Toilet? N  In the past six months, have you accidently leaked urine? N  Do you have problems with loss of bowel control? N  Managing your Medications? N  Managing your Finances? N  Housekeeping or managing your Housekeeping? Y    Patient Care Team: Saguier, Edward, PA-C as PCP - General (Internal Medicine)  I have updated your Care Teams any  recent Medical Services you may have received from other providers in the past year.     Assessment:   This is a routine wellness examination for [place].  Hearing/Vision screen Hearing Screening - Comments:: Denies hearing issues Vision Screening - Comments:: Regular eye exams, [facility]   Goals Addressed             This Visit's Progress    Patient Stated       10/18/2023. Look into you tube exercise       Depression Screen     10/18/2023   10:55 AM 09/20/2023   10:31 AM 09/20/2023    9:27 AM 12/19/2022   10:58 AM 10/17/2022   10:35 AM 10/12/2021    1:17 PM 10/06/2020   11:46 AM  PHQ 2/9 Scores  PHQ - 2 Score 0 0 0 0 0 1 0  PHQ- 9 Score 0 0 0        Fall Risk     10/11/2023    8:59 AM 09/20/2023    9:28 AM 12/19/2022   10:58 AM 10/10/2022    9:17 AM 04/25/2022    1:45 PM  Fall Risk   Falls in the past year? 0 0 0 0 0  Number falls in past yr: 0 0 0 0 0  Injury with Fall? 0 0 0 0 0  Risk for fall due to : Impaired mobility;Impaired balance/gait;Medication side effect No Fall Risks  Impaired balance/gait Impaired balance/gait;Impaired mobility  Follow up Falls prevention discussed;Falls evaluation completed   Falls evaluation completed Falls evaluation completed;Education provided    MEDICARE RISK AT HOME:  Medicare Risk at Home Any stairs in or around the home?: (Patient-Rptd) No If so, are there any without handrails?: (Patient-Rptd) No Home free of loose throw rugs in walkways, pet beds, electrical cords, etc?: (Patient-Rptd) Yes Adequate lighting in your home to reduce risk of falls?: (Patient-Rptd) Yes Life alert?: (Patient-Rptd) No Use of a cane, walker or w/c?: (Patient-Rptd) Yes Grab bars in the bathroom?: (Patient-Rptd) Yes Shower chair or bench in shower?: (Patient-Rptd) Yes Elevated toilet seat or a handicapped toilet?: (Patient-Rptd) Yes  TIMED UP AND GO:  Was the test performed?  No  Cognitive Function: 6CIT completed        10/18/2023   10:56  AM 10/17/2022   10:27 AM 10/12/2021    1:29 PM  6CIT Screen  What Year? 0 points 0 points 0 points  What month? 0 points 0 points 0 points  What time? 0 points 0 points 0 points  Count back from 20 0 points 0 points 0 points  Months in reverse 0 points 0 points 0 points  Repeat phrase 0 points 0 points 0 points  Total Score 0 points 0 points 0 points    Immunizations Immunization History  Administered Date(s) Administered   Fluad  Quad(high Dose 65+) 12/18/2018, 11/01/2020, 11/03/2021   Fluad  Trivalent(High Dose 65+) 10/17/2022   INFLUENZA, HIGH DOSE SEASONAL PF 11/29/2015   Influenza Split 11/20/2013   Influenza Whole 11/10/2012   Influenza,inj,Quad PF,6+ Mos 10/29/2014, 11/15/2017   PFIZER Comirnaty [place] Top)Covid-19 Tri-Sucrose Vaccine 05/19/2020   PFIZER(Purple Top)SARS-COV-2 Vaccination 04/20/2019, 05/10/2019   PNEUMOCOCCAL CONJUGATE-20 03/26/2023   Pfizer Covid-19 Vaccine Bivalent Booster 77yrs & up 11/01/2020   Pfizer(Comirnaty )Fall Seasonal Vaccine 12 years and older 12/07/2021   Pneumococcal Conjugate-13 12/18/2018   Pneumococcal Polysaccharide-23 09/14/2011   Tdap 04/09/2008    Screening Tests Health Maintenance  Topic Date Due  Zoster Vaccines- Shingrix  (1 of 2) Never done   Fecal DNA (Cologuard)  Never done   Lung Cancer Screening  Never done   Mammogram  02/06/2010   DTaP/Tdap/Td (2 - Td or Tdap) 04/10/2018   DEXA SCAN  Never done   Medicare Annual Wellness (AWV)  10/17/2024   Pneumococcal Vaccine: 50+ Years  Completed   Influenza Vaccine  Completed   Hepatitis C Screening  Completed   HPV VACCINES  Aged Out   Meningococcal B Vaccine  Aged Out   Colonoscopy  Discontinued   COVID-19 Vaccine  Discontinued    Health Maintenance Items Addressed: Due for TDAP and shingles vaccine.Unable to do mammogram due to wheelchair.  Additional Screening:  Vision Screening: Recommended annual ophthalmology exams for early detection of glaucoma and other disorders of  the eye. Is the patient up to date with their annual eye exam?  Yes  Who is the provider or what is the name of the office in which the patient attends annual eye exams? [facility]  Dental Screening: Recommended annual dental exams for proper oral Nurse, mental health Referral / Chronic Care Management: CRR required this visit?  No   CCM required this visit?  No   Plan:    I have personally reviewed and noted the following in the patient's chart:   Medical and social history Use of alcohol, tobacco or illicit drugs  Current medications and supplements including opioid prescriptions. Patient is not currently taking opioid prescriptions. Functional ability and status Nutritional status Physical activity Advanced directives List of other physicians Hospitalizations, surgeries, and ER visits in previous 12 months Vitals Screenings to include cognitive, depression, and falls Referrals and appointments  In addition, I have reviewed and discussed with patient certain preventive protocols, quality metrics, and best practice recommendations. A written personalized care plan for preventive services as well as general preventive health recommendations were provided to patient.   Ardella FORBES Dawn, LPN   [id number]   After Visit Summary: (MyChart) Due to this being a telephonic visit, the after visit summary with patients personalized plan was offered to patient via MyChart   Notes: Nothing significant to report at this time.

## 2023-10-22 ENCOUNTER — Ambulatory Visit (HOSPITAL_BASED_OUTPATIENT_CLINIC_OR_DEPARTMENT_OTHER)
Admission: RE | Admit: 2023-10-22 | Discharge: 2023-10-22 | Disposition: A | Discharge status: Home / Self Care | Source: Ambulatory Visit | Attending: Podiatry | Admitting: Podiatry

## 2023-10-22 DIAGNOSIS — L03031 Cellulitis of right toe: Secondary | ICD-10-CM | POA: Diagnosis not present

## 2023-10-22 DIAGNOSIS — I739 Peripheral vascular disease, unspecified: Secondary | ICD-10-CM | POA: Diagnosis not present

## 2023-10-22 DIAGNOSIS — L02611 Cutaneous abscess of right foot: Secondary | ICD-10-CM | POA: Diagnosis not present

## 2023-10-23 ENCOUNTER — Ambulatory Visit (INDEPENDENT_AMBULATORY_CARE_PROVIDER_SITE_OTHER)

## 2023-10-23 ENCOUNTER — Ambulatory Visit: Payer: Self-pay | Admitting: Podiatry

## 2023-10-23 ENCOUNTER — Other Ambulatory Visit: Payer: Self-pay | Admitting: Podiatry

## 2023-10-23 DIAGNOSIS — Z7901 Long term (current) use of anticoagulants: Secondary | ICD-10-CM

## 2023-10-23 DIAGNOSIS — I739 Peripheral vascular disease, unspecified: Secondary | ICD-10-CM

## 2023-10-23 LAB — POCT INR: INR: 2.3 (ref 2.0–3.0)

## 2023-10-23 NOTE — Progress Notes (Signed)
 Pt started atorvastatin , 10 mg, 3 days per wk, about four weeks ago. Statins can interact with warfarin and increase INR. Pt is only taking this 3 x wk so interaction may not occur. There was a dose adjustment at last INR check due to INR of 3.8.  Continue 1/2 tablet daily except take 1 tablet Fridays. Recheck in 4 weeks. Contact the coumadin  clinic number at [phone], if any changes or any questions.

## 2023-10-23 NOTE — Patient Instructions (Addendum)
 Pre visit review using our clinic review tool, if applicable. No additional management support is needed unless otherwise documented below in the visit note.  Continue 1/2 tablet daily except take 1 tablet Fridays. Recheck in 4 weeks. Contact the coumadin  clinic number at [phone], if any changes or any questions.

## 2023-11-01 ENCOUNTER — Ambulatory Visit: Admitting: Podiatry

## 2023-11-13 ENCOUNTER — Other Ambulatory Visit: Payer: Self-pay | Admitting: Medical

## 2023-11-13 ENCOUNTER — Other Ambulatory Visit: Payer: Self-pay

## 2023-11-13 ENCOUNTER — Other Ambulatory Visit (HOSPITAL_COMMUNITY): Payer: Self-pay

## 2023-11-13 ENCOUNTER — Other Ambulatory Visit: Payer: Self-pay | Admitting: Internal Medicine

## 2023-11-13 DIAGNOSIS — M059 Rheumatoid arthritis with rheumatoid factor, unspecified: Secondary | ICD-10-CM

## 2023-11-13 MED ORDER — FOLIC ACID 1 MG PO TABS
1.0000 mg | ORAL_TABLET | Freq: Every day | ORAL | 3 refills | Status: AC
  Filled 2023-11-13: qty 90, 90d supply, fill #0
  Filled 2024-02-09 (×2): qty 90, 90d supply, fill #1

## 2023-11-13 NOTE — Telephone Encounter (Signed)
 Last Fill: 08/15/2023  Next Visit: 11/27/2023  Last Visit: 08/15/2023  Dx:  Seropositive rheumatoid arthritis (HCC)   Current Dose per office note on 08/15/2023: folic acid  1 mg daily   Okay to refill Folic Acid ?

## 2023-11-13 NOTE — Progress Notes (Signed)
 Office Visit Note  Patient: Rita Watkins             Date of Birth: 06-03-1955           MRN: [id number]             PCP: Dorina Dallas RIGGERS Referring: Dorina Dallas RIGGERS Visit Date: 11/27/2023   Subjective:  Discussed the use of AI scribe software for clinical note transcription with the patient, who gave verbal consent to proceed.  History of Present Illness   Rita Watkins is a 68 y.o. 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 weekly folic acid  1 mg daily.  She reports chronic shoulder pain, describing it as a persistent issue. Her arthritis medication provides significant relief for her hand pain.   Since August, she has developed a facial rash characterized by a red breakout on her face. The rash is not itchy but is noticeable, leading her to use makeup for coverage. She associates the rash with sun exposure, noting its occurrence when she is outside for extended periods. No similar rashes in the past and no lesions inside her mouth.  She experiences swelling and discoloration in her right foot and ankle, which she has noticed for several years. 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 Swelling is more pronounced when she is sitting, as she uses a wheelchair. She manages the swelling with compression socks sometimes, wearing a small sock up to her ankle regularly and a larger sock up to her knee about three times a week. She underwent an ultrasound of her ankle and foot showing mild to moderate abnormal ABIs and has been referred to a vascular specialist.   Previous HPI 08/15/2023 CHE BELOW is a 68 y.o. 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 weekly folic acid  1 mg daily.     She experienced significant left shoulder pain following a CT scan procedure two weeks ago, during which she had to keep her arms raised above her head for an extended period. The pain was severe during  the procedure but has since subsided. She remains cautious, especially at night, avoiding lying on the affected shoulder.   The pain is currently localized to the shoulder area without radiation down the arm. No major flare-ups have occurred since the incident, and she has not sought additional treatment to avoid interfering with her INR levels due to concerns about the impact of steroids on her INR management.   She has not experienced any recent viral or bacterial infections and reports no issues with her methotrexate  treatment. She experiences morning stiffness, which she manages with stretching exercises before getting out of bed, allowing her to move without pain or stiffness.   Her current medications include methotrexate  and folic acid .       Previous HPI 05/09/2023 Rita Watkins is a 68 y.o. 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 weekly folic acid  1 mg daily.     Rheumatoid arthritis has improved with methotrexate  treatment, as indicated by a decrease in sedimentation rate from 106 to 43. She experiences minimal symptoms currently and notes significant improvement with the medication.   Her shoulder arthritis flares with weather changes, particularly affecting the shoulder. The pain does not radiate down her arm and does not disturb her sleep, although discomfort occurs if she sleeps on the shoulder.   Leg swelling  has significantly improved with the use of compression socks, allowing her to wear different pairs of shoes instead of slippers. No recent illness reported.   She attributes some redness on her leg to a space heater near her recliner, which she has adjusted to rotate instead of direct heat.      Previous HPI 12/19/2022 Rita Watkins is a 68 y.o. 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 weekly folic acid  1 mg daily.  Overall feels arthritis symptoms are doing well.  No significant daily joint pain and stiffness  and no major flareups.  She does notice cramping in her hands occurs pretty regularly often while working for prolonged time on the computer.  She often believes this with stretching or massaging her forearm muscles when it happens.  Not associated with any numbness loss of grip strength or unintentionally dropping items.   Previous HPI 09/18/2022 Rita Watkins is a 68 y.o. 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 weekly folic acid  1 mg daily.  Since her last visit she had an infection at left leg stump with a small ulcer or dehiscence lesion.  This cleared up and she completed antibiotics course. Did not contact our office or hold medications at the time.  New problem since her last visit issue with cramping or getting stuck in flexed position affecting the thumbs on both hands and other MCP joints on her right hand.  This comes and goes without warning sometimes at rest sometimes with activity such as hand grafts or while driving.  Releases after a few minutes.  Otherwise not having any flareup or increase of joint pain and swelling.   Previous HPI 06/14/2022 AMBRI Watkins is a 68 y.o. 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 weekly folic acid  1 mg daily.  Overall symptoms are doing well she has not had any particular exacerbation and does not experience any daily joint pain or swelling in her hands.  Still has issues with left shoulder pain typically more use related such as doing repetitive lifting working in her garden.  Rates it most days as around 3 out of 10 in severity but can get up to 10 out of 10 occasionally.   03/16/22 Rita Watkins is a 68 y.o. 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 weekly and folic acid  1 mg daily.  Joint pain and swelling in her hands are doing well and left shoulder pain remains mostly improved.  She is noticing some increased pain and stiffness at the right shoulder which is a  new problem.  It is not hurting at rest just with use and no radiation.  Still has some redness and irritation overlying the swelling at her right ankle with itching.   12/08/21 DEVOTA VIRUET is a 68 y.o. female here for follow up for evaluation of inflammatory arthritis concern for RA with repeat joint swelling, pain, and increased sedimentation rate. Since starting the methotrexate  15 mg PO weekly she feels a good improvement in symptoms with almost no morning stiffness and no flare ups. Shoulder and hand pains are better. Still has about the same degree of leg swelling and redness on the lateral side. She does notice some headache after taking each weekly dose but it lasts less than one day duration. She notices occasional shoulder pain provoked with lifting cases of water.   10/25/2021  LAKITHA GORDY is a 68 y.o. female here for follow up for evaluation of inflammatory arthritis concern for RA with repeat joint swelling, pain, and increased sedimentation rate and improvement on prednisone . Xrays at last visit showed no erosive disease and mild severity of OA changes. Lab tests were all negative for markers of ANA-related disease process. CBC and CMP okay, does have mild renal impairment. Since our visit she has a flare up of left shoulder pain limiting mobility. Pain radiates into the arm when getting more severe. Her ankle remains the same amount swollen. No new episode of hand swelling.   Previous HPI 10/06/2021 DEETTA SIEGMANN is a 68 y.o. 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 She developed pain and swelling involving ankle and in right 2nd finger that was treated with prednisone  taper with good response in PCP office. Lab testing positive for RF and elevated uric acid level. She has had joint pain in multiple areas but symptoms significantly increased since earlier this year.  In May she developed severe pain involving the left shoulder and right hand particularly her second finger.  She  saw significant swelling in the hands and around the right ankle.  She was treated with a oral prednisone  course lasting 6 days and the symptoms stayed improved for a little over 1 month.  She had a recurrence of joint swelling in July that also improved quickly with oral prednisone .  Due to the swelling and warmth around the ankle she is been treated with oral antibiotics that did not significantly affect her symptoms. 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  Was complicated by ultimately developing sepsis requiring hospital visit and prolonged nursing care treatment course.   Labs reviewed 06/2021 ANA 1:80 cytoplasmic reticular/AMA RF 40 Uric acid 8.3 ESR 33 CPR <1   Review of Systems  Constitutional:  Negative for fatigue.  HENT:  Negative for mouth sores and mouth dryness.   Eyes:  Negative for dryness.  Respiratory:  Negative for shortness of breath.   Cardiovascular:  Negative for chest pain and palpitations.  Gastrointestinal:  Negative for blood in stool, constipation and diarrhea.  Endocrine: Negative for increased urination.  Genitourinary:  Negative for involuntary urination.  Musculoskeletal:  Positive for joint pain and joint pain. Negative for gait problem, joint swelling, myalgias, muscle weakness, morning stiffness, muscle tenderness and myalgias.  Skin:  Positive for color change, rash and sensitivity to sunlight. Negative for hair loss.  Allergic/Immunologic: Negative for susceptible to infections.  Neurological:  Negative for dizziness and headaches.  Hematological:  Negative for swollen glands.  Psychiatric/Behavioral:  Negative for depressed mood and sleep disturbance. The patient is not nervous/anxious.     PMFS History:  Patient Active Problem List   Diagnosis Date Noted    Elevated coronary artery calcium  score 1969 which is 99th percentile 08/16/2023   History of DVT (deep vein thrombosis) 05/15/2023   Locking finger joint 09/18/2022   High risk medication use 03/16/2022   Seropositive rheumatoid arthritis (HCC) 10/06/2021   Sedimentation rate elevation 10/06/2021   Ankle swelling, right 10/06/2021   Cardiac murmur 04/20/2020   Obesity (BMI 30.0-34.9) 04/20/2020   History of pulmonary embolism 04/20/2020   Essential hypertension 04/20/2020   Allergy    Anxiety    COPD (chronic obstructive pulmonary disease) (HCC)    Depression  Hyperlipidemia    Hypertension    Anemia associated with acute blood loss 08/22/2016   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   Peripheral vascular disease 08/19/2016   Deep vein thrombosis (DVT) of popliteal vein of both lower extremities (HCC) 08/18/2016   Pulmonary embolism without acute cor pulmonale (HCC) 08/18/2016   Tobacco use 08/18/2016   Open leg wound, left, sequela 08/17/2016   Surgical wound, non healing 08/16/2016   Pyoderma gangrenosa (HCC) 08/14/2016   Benign essential HTN 08/14/2016   Dizziness 07/19/2016   Nausea 07/19/2016   Necrosis (HCC) 07/19/2016   Mycobacterial infection 06/07/2016   Lymphangitis 06/07/2016   Mycobacterium infection, 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  CT abdomen 7/17:   1.  Large left retroperitoneal hematoma with mixed density blood products.  Assessment for active bleeding is not possible without IV contrast. 2.  Contrast in the renal collecting systems presumably from contrast enhanced studies performed on August 20, 2016 consistent with known impaired renal function.   CT abdomen 7/27:    Ankle swelling, right 10/06/2021    Anxiety    Had my first panic attack when i woke up and discovered my   Benign essential HTN 08/14/2016   Cardiac murmur 04/20/2020   COPD (chronic obstructive pulmonary disease) (HCC)    COPD (chronic obstructive pulmonary disease) (HCC)    I know nothing of this   Deep vein thrombosis (DVT) of popliteal vein of both lower extremities (HCC) 08/18/2016   Last Assessment & Plan:  Formatting of this note might be different from the original. Not clear if provoked/unprovoked (pt had trauma to left toe back in 03/2016 with some decreased mobility thereafter) CT chest confirmed acute PE and also concerning for some bilateral upper lobe groundglass opacities Hematology on board and heparin drip started but  discontinued 7/17  2/2 large retroperitoneal he   Depression    Went into severe depression when i experienced my leg amputa   Depression with anxiety 09/13/2012   Dizziness 07/19/2016   Essential hypertension 04/20/2020   High risk medication use 03/16/2022   History of DVT (deep vein thrombosis) 05/15/2023   History of pulmonary embolism 04/20/2020   Hyperlipidemia    I know nothing about this   Hypertension    Leukocytosis 07/30/2014   Locking finger joint 09/18/2022   Lymphangitis 06/07/2016   Mycobacterial infection 06/07/2016   Mycobacterial infection 06/07/2016   Mycobacterium infection, 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 Per dermatology: Please provide the patient with our outpatient clinic phone number [phone]) to call and schedule a follow up appointment for continued management.   Last Assessment & Plan:  Formatting of this note might be different from the original. Uncertain etiology Vasculitic vs infectious vs malignancy Extensive work up has   Obesity (BMI 30.0-34.9) 04/20/2020   Open leg wound, left, sequela 08/17/2016    Formatting of this note might be different from the original. - Chronic left wound starting in April 2018 and worsening despite antibiotics and debridement -Patient had multiple hospitalizations and was treated with steroids and antibiotics with worsening/extension of the wound - ESR  103, CRP 168 - ANA, factor V Leyden, Antithrombin III , Cardiolite and antibody IgG, C3 and C4 complement proteinase   Peripheral vascular disease 08/19/2016   Last Assessment & Plan:  Formatting of this note might be different from the original. 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  ASA 81 mg daily PT consult   Pulmonary embolism without acute cor pulmonale (HCC) 08/18/2016   Last Assessment & Plan:  Formatting of this note might be different from the original. Uncertain etiology  Was on heparin gtt but discontinued 2/2 large retroperitoneal hematoma (now stable on follow up CT) O2 PRN IVC placed  Vascular following No SOB, SpO2 93% on room air On warfarin, on hold due to supratherapeutic INR, will restart at 3mg  once able   Pyoderma gangrenosa (HCC) 08/14/2016   Sedimentation rate elevation 10/06/2021   Seropositive rheumatoid arthritis (HCC) 10/06/2021   Surgical wound, non healing 08/16/2016   Formatting of this note might be different from the original. Added automatically from request for surgery [id number]  Last Assessment & Plan:  Formatting of this note might be different from the original. Presented with large area of necrotic tissue LLE Bilateral popliteal DVT Etiology uncertain:  Ischemic vs vasculitis vs autoimmune vs possible occult malignancy Bx: leukocytoblastic vasculopathy  In   Tobacco use 08/18/2016   Last Assessment & Plan:  Formatting of this note might be  different from the original. Patient has a 30+ pack year history of smoking Discussed low dose CT chest for cancer screening at admission, patient open to this but concerned about extra cost as she has no insurance at this time. No obvious suspicious lesions noted on CTA PE study.  Counseled regarding tobacco cessation    Family History  Problem Relation Age of Onset   Hypertension Mother    Hyperlipidemia Mother    Osteoarthritis Mother    Rheum arthritis Mother    Arthritis Mother    Hypertension Brother    Asthma Brother    Osteoarthritis Brother    Obesity Brother    Hyperlipidemia Brother    Past Surgical History:  Procedure Laterality Date   FOOT SURGERY     Left   HIP PINNING     3 each hip   IRRIGATION AND DEBRIDEMENT ABSCESS N/A 06/27/2016   Procedure: IRRIGATION AND DEBRIDEMENT LEFT LEG WITH SOTFT TISSUE BIOPSY;  Surgeon: Signe Mitzie LABOR, MD;  Location: [facility];  Service: General;  Laterality: N/A;   LEG AMPUTATION Left    LYMPH NODE BIOPSY Left 06/09/2016   Procedure: BIOPSY LEFT LEG;  Surgeon: Lowery Estefana RAMAN, DO;  Location: [facility];  Service: Plastics;  Laterality: Left;   SLIPPED CAPITAL FEMORAL EPIPHYSIS PINNING     teeth removal  03/2023   Social History   Social History Narrative   Does not exercise.   Immunization History  Administered Date(s) Administered   Fluad  Quad(high Dose 65+) 12/18/2018, 11/01/2020, 11/03/2021   Fluad  Trivalent(High Dose 65+) 10/17/2022   INFLUENZA, HIGH DOSE SEASONAL PF 11/29/2015   Influenza Split 11/20/2013   Influenza Whole 11/10/2012   Influenza,inj,Quad PF,6+ Mos 10/29/2014, 11/15/2017   PFIZER Comirnaty [place] Top)Covid-19 Tri-Sucrose Vaccine 05/19/2020   PFIZER(Purple Top)SARS-COV-2 Vaccination 04/20/2019, 05/10/2019   PNEUMOCOCCAL CONJUGATE-20 03/26/2023   Pfizer Covid-19 Vaccine Bivalent Booster 17yrs & up  11/01/2020   Pfizer(Comirnaty )Fall Seasonal Vaccine 12 years and older 12/07/2021   Pneumococcal Conjugate-13  12/18/2018   Pneumococcal Polysaccharide-23 09/14/2011   Tdap 04/09/2008     Objective: Vital Signs: BP (!) 140/71   Pulse (!) 50   Temp (!) 97.5 F (36.4 C)   Resp 16   Ht 5' 9 (1.753 m)   Wt 225 lb (102.1 kg)   BMI 33.23 kg/m    Physical Exam Constitutional:      Appearance: She is obese.     Comments: In wheelchair  Eyes:     Conjunctiva/sclera: Conjunctivae normal.  Cardiovascular:     Rate and Rhythm: Normal rate and regular rhythm.  Pulmonary:     Effort: Pulmonary effort is normal.     Breath sounds: Normal breath sounds.  Lymphadenopathy:     Cervical: No cervical adenopathy.  Skin:    General: Skin is warm and dry.     Comments: 1+ pitting edema in right ankle Hyperpigmentation, and small area of erythema on lateral side   Neurological:     Mental Status: She is alert.  Psychiatric:        Mood and Affect: Mood normal.      Musculoskeletal Exam:  Right shoulder full ROM, left shoulder painful with internal and external rotation, guarding against full abduction, tenderness to pressure worse on anterior side Elbows full ROM no tenderness or swelling Wrists full ROM no tenderness or swelling Fingers full ROM 1st CMC joint squaring, no tenderness or swelling Left leg AKA  Investigation: No additional findings.  Imaging: No results found.   Recent Labs: Lab Results  Component Value Date   WBC 4.5 08/15/2023   HGB 12.1 08/15/2023   PLT 187 08/15/2023   NA 141 09/20/2023   K 4.6 09/20/2023   CL 105 09/20/2023   CO2 31 09/20/2023   GLUCOSE 89 09/20/2023   BUN 12 09/20/2023   CREATININE 0.93 09/20/2023   BILITOT 0.5 09/20/2023   ALKPHOS 97 03/26/2023   AST 22 09/20/2023   ALT 14 09/20/2023   PROT 6.5 09/20/2023   ALBUMIN 3.9 03/26/2023   CALCIUM  8.8 09/20/2023   GFRAA 72 08/05/2019    Speciality Comments: Patient states she would like her folic acid  to go to [employer]  Procedures:  No procedures performed Allergies: Avelox  [moxifloxacin hcl in nacl], Montelukast sodium, and Pravastatin   Assessment / Plan:     Visit Diagnoses: Seropositive rheumatoid arthritis (HCC) - Plan: Sedimentation rate Inflammatory arthritis still appears overall pretty well-controlled. Clinical improvement only active complaints today shoulder and right leg. Prefers to avoid escalating treatment or adding injectables. -Checking sed rate for disease activity monitoring -Continue methotrexate  15 mg p.o. weekly and folic acid  1 mg daily  High risk medication use - Plan: CBC with Differential/Platelet, Comprehensive metabolic panel with GFR Tolerating medication without reported side effects. No serious infections. - Checking CBC and CMP for medication monitoring on continued long-term use of methotrexate   Stasis dermatitis of lower extremity due to chronic venous insufficiency Chronic stasis dermatitis due to venous insufficiency, exacerbated by prolonged sitting. Persistent for six years post-amputation. Compression therapy might help edema but with abnormal ABIs could be a contraindication. Agree with referral to vascular specialist for venous insufficiency and/or arterial disease workup and management  Photosensitive facial rash Photosensitive facial rash likely medication-induced, cosmetically concerning, exacerbated by outdoor exposure.  Chronic left shoulder pain Chronic left shoulder pain, persistent, no acute changes.    Orders: Orders Placed This Encounter  Procedures   Sedimentation rate   CBC with Differential/Platelet   Comprehensive metabolic panel with GFR   No orders of the defined types were placed in this encounter.    Follow-Up Instructions: No follow-ups on file.   Lonni LELON Ester, MD  Note - This record has been created using [employer].  Chart creation errors have been sought, but may not always  have been located. Such creation errors do not reflect on  the standard of medical care.

## 2023-11-14 ENCOUNTER — Other Ambulatory Visit: Payer: Self-pay

## 2023-11-20 ENCOUNTER — Ambulatory Visit

## 2023-11-20 DIAGNOSIS — Z7901 Long term (current) use of anticoagulants: Secondary | ICD-10-CM

## 2023-11-20 LAB — POCT INR: INR: 2.3 (ref 2.0–3.0)

## 2023-11-20 NOTE — Progress Notes (Signed)
 Continue 1/2 tablet daily except take 1 tablet Fridays. Recheck in 4 weeks. Contact the coumadin  clinic number at [phone], if any changes or any questions.

## 2023-11-20 NOTE — Patient Instructions (Addendum)
 Pre visit review using our clinic review tool, if applicable. No additional management support is needed unless otherwise documented below in the visit note.  Continue 1/2 tablet daily except take 1 tablet Fridays. Recheck in 4 weeks. Contact the coumadin  clinic number at [phone], if any changes or any questions.

## 2023-11-25 ENCOUNTER — Other Ambulatory Visit: Payer: Self-pay | Admitting: Internal Medicine

## 2023-11-25 DIAGNOSIS — M059 Rheumatoid arthritis with rheumatoid factor, unspecified: Secondary | ICD-10-CM

## 2023-11-26 NOTE — Telephone Encounter (Signed)
 Last Fill: 08/15/2023  Labs: 09/20/2023 CMP WNL, 08/15/2023 CBC WNL  Next Visit: 11/27/2023  Last Visit: 08/15/2023  DX: Seropositive rheumatoid arthritis   Current Dose per office note 08/15/2023: methotrexate  15 mg p.o. weekly   Patient to update labs at upcoming appointment on 11/27/2023  Okay to refill Methotrexate ?

## 2023-11-27 ENCOUNTER — Ambulatory Visit: Attending: Internal Medicine | Admitting: Internal Medicine

## 2023-11-27 ENCOUNTER — Encounter: Payer: Self-pay | Admitting: Internal Medicine

## 2023-11-27 VITALS — BP 140/71 | HR 50 | Temp 97.5°F | Resp 16 | Ht 69.0 in | Wt 225.0 lb

## 2023-11-27 DIAGNOSIS — I739 Peripheral vascular disease, unspecified: Secondary | ICD-10-CM | POA: Diagnosis not present

## 2023-11-27 DIAGNOSIS — Z79899 Other long term (current) drug therapy: Secondary | ICD-10-CM | POA: Diagnosis not present

## 2023-11-27 DIAGNOSIS — M059 Rheumatoid arthritis with rheumatoid factor, unspecified: Secondary | ICD-10-CM

## 2023-11-27 DIAGNOSIS — M25512 Pain in left shoulder: Secondary | ICD-10-CM

## 2023-11-28 LAB — COMPREHENSIVE METABOLIC PANEL WITH GFR
AG Ratio: 1.4 (calc) (ref 1.0–2.5)
ALT: 10 U/L (ref 6–29)
AST: 13 U/L (ref 10–35)
Albumin: 3.7 g/dL (ref 3.6–5.1)
Alkaline phosphatase (APISO): 95 U/L (ref 37–153)
BUN: 14 mg/dL (ref 7–25)
CO2: 31 mmol/L (ref 20–32)
Calcium: 8.5 mg/dL — ABNORMAL LOW (ref 8.6–10.4)
Chloride: 105 mmol/L (ref 98–110)
Creat: 0.87 mg/dL (ref 0.60–1.00)
Globulin: 2.6 g/dL (ref 1.9–3.7)
Glucose, Bld: 84 mg/dL (ref 65–99)
Potassium: 4.1 mmol/L (ref 3.5–5.3)
Sodium: 141 mmol/L (ref 135–146)
Total Bilirubin: 0.4 mg/dL (ref 0.2–1.2)
Total Protein: 6.3 g/dL (ref 6.1–8.1)
eGFR: 72 mL/min/1.73m2 (ref >=60)

## 2023-11-28 LAB — CBC WITH DIFFERENTIAL/PLATELET
Absolute Lymphocytes: 1330 {cells}/uL (ref 850–3900)
Absolute Monocytes: 616 {cells}/uL (ref 200–950)
Basophils Absolute: 19 {cells}/uL (ref 0–200)
Basophils Relative: 0.4 %
Eosinophils Absolute: 141 {cells}/uL (ref 15–500)
Eosinophils Relative: 3 %
HCT: 38.8 % (ref 35.0–45.0)
Hemoglobin: 12.5 g/dL (ref 11.7–15.5)
MCH: 31.3 pg (ref 27.0–33.0)
MCHC: 32.2 g/dL (ref 32.0–36.0)
MCV: 97 fL (ref 80.0–100.0)
MPV: 10.3 fL (ref 7.5–12.5)
Monocytes Relative: 13.1 %
Neutro Abs: 2594 {cells}/uL (ref 1500–7800)
Neutrophils Relative %: 55.2 %
Platelets: 199 Thousand/uL (ref 140–400)
RBC: 4 Million/uL (ref 3.80–5.10)
RDW: 13.4 % (ref 11.0–15.0)
Total Lymphocyte: 28.3 %
WBC: 4.7 Thousand/uL (ref 3.8–10.8)

## 2023-11-28 LAB — SEDIMENTATION RATE: Sed Rate: 31 mm/h — ABNORMAL HIGH (ref 0–30)

## 2023-12-05 ENCOUNTER — Encounter: Payer: Self-pay | Admitting: Medical

## 2023-12-05 ENCOUNTER — Other Ambulatory Visit: Payer: Self-pay | Admitting: Medical

## 2023-12-05 ENCOUNTER — Ambulatory Visit: Admitting: Pharmacist

## 2023-12-05 DIAGNOSIS — Z79899 Other long term (current) drug therapy: Secondary | ICD-10-CM

## 2023-12-05 DIAGNOSIS — Z86718 Personal history of other venous thrombosis and embolism: Secondary | ICD-10-CM

## 2023-12-05 NOTE — Telephone Encounter (Signed)
 Requesting: alprazolam  0.5mg   Contract:03/26/23 UDS:  03/26/23 Last Visit: 09/20/23 Next Visit: None Last Refill: 10/03/23 #60 and 1RF   Please Advise

## 2023-12-05 NOTE — Telephone Encounter (Signed)
Rx refill sent to pt pharmacy 

## 2023-12-05 NOTE — Progress Notes (Signed)
 12/05/2023 Name: Rita Watkins MRN: [id number] DOB: 07-14-1955  Chief Complaint  Patient presents with   Medication Management    Rita Watkins is a 68 y.o. year old female who presented for a telephone visit.   They were referred to the pharmacist by their PCP for assistance in managing medication access.    Subjective:  I had spoken with patient in March of 2025 because she was interested in changing from warfarin to either Xarelto  or Eliquis due to difficulty getting into office and fewer drug and food interactions. She is amputee and requires some assistance. At the time she decided to continue with warfarin due to the cost of Xarelto  and Eliquis. She had wanted to check to see if cost would be better in 2026.    Taking warfarin for history of DVT.   Medication Access/Adherence  Current Pharmacy:  [facility] [address] [place] [address] [place] [address] Phone: [phone] Fax: [phone]  [facility] [address] [place] [address] Phone: [phone] Fax: [phone]   Objective:  Lab Results  Component Value Date   HGBA1C 5.7 07/31/2022    Lab Results  Component Value Date   CREATININE 0.87 11/27/2023   BUN 14 11/27/2023   NA 141 11/27/2023   K 4.1 11/27/2023   CL 105 11/27/2023   CO2 31 11/27/2023    Lab Results  Component Value Date   CHOL 171 09/20/2023   HDL 36.60 (L) 09/20/2023   LDLCALC 101 (H) 09/20/2023   LDLDIRECT 165.0 03/03/2020   TRIG 169.0 (H) 09/20/2023   CHOLHDL 5 09/20/2023    Medications Reviewed Today     Reviewed by Rita Watkins, RPH-CPP (Pharmacist) on 12/05/23 at 1121  Med List Status: <None>   Medication Order Taking? Sig Documenting Provider Last Dose Status Informant  albuterol  (VENTOLIN  HFA) 108 (90 Base) MCG/ACT inhaler [id number]  Inhale 2 puffs into the lungs every 6 (six) hours as needed.  Patient not taking: Reported on 12/05/2023    [place], PA-C  Active   ALPRAZolam  (XANAX ) 0.5 MG tablet [id number] Yes TAKE 1 TABLET BY MOUTH 2 TIMES DAILY AS NEEDED FOR ANXIETY. Watkins, Dallas, PA-C  Active   ALPRAZolam  (XANAX ) 0.5 MG tablet [id number]  Take 1 tablet (0.5 mg total) by mouth 2 (two) times daily as needed for anxiety. Watkins, Dallas, PA-C  Active   amLODipine  (NORVASC ) 2.5 MG tablet [id number] Yes TAKE 1 TABLET BY MOUTH EVERY DAY Watkins, Edward, PA-C  Active   atorvastatin  (LIPITOR) 10 MG tablet [id number] Yes 1 tab po Monday wed and friday Watkins, Edward, PA-C  Active   benazepril  (LOTENSIN ) 20 MG tablet [id number] Yes Take 1 tablet (20 mg total) by mouth 2 (two) times daily. Watkins, Dallas, PA-C  Active    Patient not taking:   Discontinued 12/05/23 1119 (Completed Course)    Patient not taking:   Discontinued 03/03/20 1112   ezetimibe  (ZETIA ) 10 MG tablet [id number] Yes Take 1 tablet (10 mg total) by mouth daily. Watkins, Dallas, PA-C  Active   folic acid  (FOLVITE ) 1 MG tablet [id number] Yes Take 1 tablet (1 mg total) by mouth daily. (While on methotrexate  therapy) Rita Watkins  Active   gabapentin  (NEURONTIN ) 300 MG capsule [id number] Yes Take 1 capsule (300 mg total) by mouth at bedtime. Watkins, Dallas, PA-C  Active   methotrexate  (RHEUMATREX) 2.5 MG tablet [id number] Yes TAKE 6 TABLETS (15 MG TOTAL) BY  MOUTH ONCE A WEEK. CAUTION: CHEMOTHERAPY. PROTECT FROM LIGHT. Rita Watkins  Active    Patient not taking:   Discontinued 12/05/23 1121 (Completed Course)   warfarin (COUMADIN ) 5 MG tablet [id number] Yes TAKE 1/2 TABLET BY MOUTH DAILY EXCEPT TAKE 1 TABLET ON MONDAY, WEDNESDAY AND FRIDAY OR AS DIRECTED BY ANTICOAGULATION CLINIC  Patient taking differently: TAKE 1/2 TABLET BY MOUTH DAILY EXCEPT TAKE 1 TABLET ON FRIDAYS OR AS DIRECTED BY ANTICOAGULATION CLINIC   Watkins, Rita, [place]  Active               Assessment/Plan:   Medication Management - difficulty getting to office for warfarin  INR checks - would like to consider alternatives - checked her 2026 [facility] formulary. She is planning to keep the same plan because she is happy with it [employer] [place] Medicare plan [phone]. Eliquis and Xarelto  are tier 3 She has a $615 deductible to meet and then cost of either Eliquis or Xeralto would be about $50 to $60 per month. Warfarin cost is $0 for 2026  - She will continue warfarin and INR checks as recommended.    Rita Watkins, PharmD Clinical Pharmacist [facility]

## 2023-12-10 NOTE — Progress Notes (Unsigned)
 [facility] [place]  ASSESSMENT / PLAN: Rita Watkins is a 68 y.o. female with atherosclerosis of native arteries of right lower extremity causing no symptoms.  Recommend:  Abstinence from all tobacco products. Blood glucose control with goal A1c < 7%. Blood pressure control with goal blood pressure < 130/80 mmHg. Lipid reduction therapy with goal LDL-C < 55 mg/dL. Aspirin 81mg  by mouth daily. Atorvastatin  40-80mg  PO QD (or other high intensity statin therapy).  Noninvasive testing reassuring.  Plan surveillance of peripheral arterial disease with follow-up in 12 months with ABI.  CHIEF COMPLAINT: Right foot ulcer, noninvasive testing worrisome PAD  HISTORY OF PRESENT ILLNESS: Rita Watkins is a 68 y.o. female referred to clinic for evaluation of right foot ulceration in setting of peripheral arterial disease.  The patient is under the care of Dr. Gershon, and has followed with him for some time.  The patient unfortunately lost her left leg to necrotizing soft tissue infection.  She has an AKA on the left.  She gets around mostly with a wheelchair.  She developed a ulcer on her right great toe.  This is thankfully healed.  Noninvasive testing was worrisome for ABI.  I reviewed this with her in detail.  No symptoms of claudication or rest pain.  No active ulceration on the right foot.  Past Medical History:  Diagnosis Date   Allergy    Cramps brought on by taking statins   Anemia associated with acute blood loss 08/22/2016   Formatting of this note is different from the original.  CT abdomen 7/17:   1.  Large left retroperitoneal hematoma with mixed density blood products.  Assessment for active bleeding is not possible without IV contrast. 2.  Contrast in the renal collecting systems presumably from contrast enhanced studies performed on August 20, 2016 consistent with known impaired renal function.   CT abdomen 7/27:    Ankle swelling, right 10/06/2021   Anxiety     Had my first panic attack when i woke up and discovered my   Benign essential HTN 08/14/2016   Cardiac murmur 04/20/2020   COPD (chronic obstructive pulmonary disease) (HCC)    COPD (chronic obstructive pulmonary disease) (HCC)    I know nothing of this   Deep vein thrombosis (DVT) of popliteal vein of both lower extremities (HCC) 08/18/2016   Last Assessment & Plan:  Formatting of this note might be different from the original. Not clear if provoked/unprovoked (pt had trauma to left toe back in 03/2016 with some decreased mobility thereafter) CT chest confirmed acute PE and also concerning for some bilateral upper lobe groundglass opacities Hematology on board and heparin drip started but  discontinued 7/17  2/2 large retroperitoneal he   Depression    Went into severe depression when i experienced my leg amputa   Depression with anxiety 09/13/2012   Dizziness 07/19/2016   Essential hypertension 04/20/2020   High risk medication use 03/16/2022   History of DVT (deep vein thrombosis) 05/15/2023   History of pulmonary embolism 04/20/2020   Hyperlipidemia    I know nothing about this   Hypertension    Leukocytosis 07/30/2014   Locking finger joint 09/18/2022   Lymphangitis 06/07/2016   Mycobacterial infection 06/07/2016   Mycobacterial infection 06/07/2016   Mycobacterium infection, 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 Per dermatology: Please provide the patient  with our outpatient clinic phone number [phone]) to call and schedule a follow up appointment for continued management.   Last Assessment & Plan:  Formatting of this note might be different from the original. Uncertain etiology Vasculitic vs infectious vs malignancy Extensive work up has   Obesity (BMI 30.0-34.9) 04/20/2020   Open leg wound, left, sequela 08/17/2016   Formatting of this  note might be different from the original. - Chronic left wound starting in April 2018 and worsening despite antibiotics and debridement -Patient had multiple hospitalizations and was treated with steroids and antibiotics with worsening/extension of the wound - ESR 103, CRP 168 - ANA, factor V Le[place], Antithrombin III , Cardiolite and antibody IgG, C3 and C4 complement proteinase   Peripheral vascular disease 08/19/2016   Last Assessment & Plan:  Formatting of this note might be different from the original. 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  ASA 81 mg daily PT consult   Pulmonary embolism without acute cor pulmonale (HCC) 08/18/2016   Last Assessment & Plan:  Formatting of this note might be different from the original. Uncertain etiology  Was on heparin gtt but discontinued 2/2 large retroperitoneal hematoma (now stable on follow up CT) O2 PRN IVC placed  Vascular following No SOB, SpO2 93% on room air On warfarin, on hold due to supratherapeutic INR, will restart at 3mg  once able   Pyoderma gangrenosa (HCC) 08/14/2016   Sedimentation rate elevation 10/06/2021   Seropositive rheumatoid arthritis (HCC) 10/06/2021   Surgical wound, non healing 08/16/2016   Formatting of this note might be different from the original. Added automatically from request for surgery [id number]  Last Assessment & Plan:  Formatting of this note might be different from the original. Presented with large area of necrotic tissue LLE Bilateral popliteal DVT Etiology uncertain:  Ischemic vs vasculitis vs autoimmune vs possible occult malignancy Bx: leukocytoblastic vasculopathy  In   Tobacco use 08/18/2016   Last Assessment & Plan:  Formatting of this note might be different from the  original. Patient has a 30+ pack year history of smoking Discussed low dose CT chest for cancer screening at admission, patient open to this but concerned about extra cost as she has no insurance at this time. No obvious suspicious lesions noted on CTA PE study.  Counseled regarding tobacco cessation    Past Surgical History:  Procedure Laterality Date   FOOT SURGERY     Left   HIP PINNING     3 each hip   IRRIGATION AND DEBRIDEMENT ABSCESS N/A 06/27/2016   Procedure: IRRIGATION AND DEBRIDEMENT LEFT LEG WITH SOTFT TISSUE BIOPSY;  Surgeon: Signe Mitzie LABOR, MD;  Location: [facility];  Service: General;  Laterality: N/A;   LEG AMPUTATION Left    LYMPH NODE BIOPSY Left 06/09/2016   Procedure: BIOPSY LEFT LEG;  Surgeon: Lowery Estefana RAMAN, DO;  Location: [facility];  Service: Plastics;  Laterality: Left;   SLIPPED CAPITAL FEMORAL EPIPHYSIS PINNING     teeth removal  03/2023    Family History  Problem Relation Age of Onset   Hypertension Mother    Hyperlipidemia Mother    Osteoarthritis Mother    Rheum arthritis Mother    Arthritis Mother    Hypertension Brother    Asthma Brother  Osteoarthritis Brother    Obesity Brother    Hyperlipidemia Brother     Social History   Socioeconomic History   Marital status: Widowed    Spouse name: Not on file   Number of children: Not on file   Years of education: high schoo   Highest education level: 12th grade  Occupational History   Occupation: ADM. ASSISTANT    Employer: [employer]  Tobacco Use   Smoking status: Former    Current packs/day: 0.00    Average packs/day: 1 pack/day for 35.0 years (35.0 ttl pk-yrs)    Types: Cigarettes    Start date: 09/14/1981    Quit date: 09/14/2016    Years since quitting: 7.2    Passive exposure: Past   Smokeless tobacco: Never  Vaping Use   Vaping status: Never Used  Substance and Sexual Activity   Alcohol use: No    Alcohol/week: 0.0 standard drinks of alcohol   Drug use: No   Sexual activity: Not  Currently    Birth control/protection: Post-menopausal  Other Topics Concern   Not on file  Social History Narrative   Does not exercise.   Social Drivers of Corporate Investment Banker Strain: Low Risk  (10/18/2023)   Overall Financial Resource Strain (CARDIA)    Difficulty of Paying Living Expenses: Not hard at all  Food Insecurity: No Food Insecurity (10/18/2023)   Hunger Vital Sign    Worried About Running Out of Food in the Last Year: Never true    Ran Out of Food in the Last Year: Never true  Transportation Needs: No Transportation Needs (10/18/2023)   PRAPARE - Administrator, Civil Service (Medical): No    Lack of Transportation (Non-Medical): No  Recent Concern: Transportation Needs - Unmet Transportation Needs (09/13/2023)   PRAPARE - Transportation    Lack of Transportation (Medical): Yes    Lack of Transportation (Non-Medical): Yes  Physical Activity: Inactive (10/18/2023)   Exercise Vital Sign    Days of Exercise per Week: 0 days    Minutes of Exercise per Session: 0 min  Stress: No Stress Concern Present (10/18/2023)   [employer] of Occupational Health - Occupational Stress Questionnaire    Feeling of Stress: Only a little  Social Connections: Moderately Integrated (10/18/2023)   Social Connection and Isolation Panel    Frequency of Communication with Friends and Family: More than three times a week    Frequency of Social Gatherings with Friends and Family: More than three times a week    Attends Religious Services: More than 4 times per year    Active Member of [employer] or Organizations: Yes    Attends Banker Meetings: More than 4 times per year    Marital Status: Widowed  Intimate Partner Violence: Not At Risk (10/18/2023)   Humiliation, Afraid, Rape, and Kick questionnaire    Fear of Current or Ex-Partner: No    Emotionally Abused: No    Physically Abused: No    Sexually Abused: No    Allergies  Allergen Reactions   Avelox  [Moxifloxacin Hcl In Nacl] Anaphylaxis   Montelukast Sodium Other (See Comments)    Insomnia   Pravastatin Other (See Comments)    Myalgias on 20mg     Current Outpatient Medications  Medication Sig Dispense Refill   albuterol  (VENTOLIN  HFA) 108 (90 Base) MCG/ACT inhaler Inhale 2 puffs into the lungs every 6 (six) hours as needed. (Patient not taking: Reported on 12/05/2023) 6.7 g 0  ALPRAZolam  (XANAX ) 0.5 MG tablet TAKE 1 TABLET BY MOUTH 2 TIMES DAILY AS NEEDED FOR ANXIETY. 60 tablet 3   ALPRAZolam  (XANAX ) 0.5 MG tablet TAKE 1 TABLET BY MOUTH 2 TIMES DAILY AS NEEDED FOR ANXIETY. 60 tablet 1   amLODipine  (NORVASC ) 2.5 MG tablet TAKE 1 TABLET BY MOUTH EVERY DAY 90 tablet 1   atorvastatin  (LIPITOR) 10 MG tablet 1 tab po Monday wed and friday 12 tablet 3   benazepril  (LOTENSIN ) 20 MG tablet Take 1 tablet (20 mg total) by mouth 2 (two) times daily. 180 tablet 3   ezetimibe  (ZETIA ) 10 MG tablet Take 1 tablet (10 mg total) by mouth daily. 90 tablet 3   folic acid  (FOLVITE ) 1 MG tablet Take 1 tablet (1 mg total) by mouth daily. (While on methotrexate  therapy) 90 tablet 3   gabapentin  (NEURONTIN ) 300 MG capsule Take 1 capsule (300 mg total) by mouth at bedtime. 90 capsule 3   methotrexate  (RHEUMATREX) 2.5 MG tablet TAKE 6 TABLETS (15 MG TOTAL) BY MOUTH ONCE A WEEK. CAUTION: CHEMOTHERAPY. PROTECT FROM LIGHT. 72 tablet 0   warfarin (COUMADIN ) 5 MG tablet TAKE 1/2 TABLET BY MOUTH DAILY EXCEPT TAKE 1 TABLET ON MONDAY, WEDNESDAY AND FRIDAY OR AS DIRECTED BY ANTICOAGULATION CLINIC (Patient taking differently: TAKE 1/2 TABLET BY MOUTH DAILY EXCEPT TAKE 1 TABLET ON FRIDAYS OR AS DIRECTED BY ANTICOAGULATION CLINIC) 80 tablet 1   No current facility-administered medications for this visit.    PHYSICAL EXAM There were no vitals filed for this visit. No acute distress Regular rate and rhythm Unlabored breathing Left above-knee amputation Right leg without palpable pedal pulses Foot is warm and  well-perfused Chronic venous insufficiency with venous stasis dermatitis of the pretibial skin.   No active ulceration of the foot  PERTINENT LABORATORY AND RADIOLOGIC DATA  Most recent CBC    Latest Ref Rng & Units 11/27/2023   12:21 PM 08/15/2023   10:27 AM 05/09/2023   12:05 PM  CBC  WBC 3.8 - 10.8 Thousand/uL 4.7  4.5  5.1   Hemoglobin 11.7 - 15.5 g/dL 87.4  87.8  87.9   Hematocrit 35.0 - 45.0 % 38.8  37.8  36.8   Platelets 140 - 400 Thousand/uL 199  187  208      Most recent CMP    Latest Ref Rng & Units 11/27/2023   12:21 PM 09/20/2023   10:45 AM 08/15/2023   10:27 AM  CMP  Glucose 65 - 99 mg/dL 84  89  81   BUN 7 - 25 mg/dL 14  12  16    Creatinine 0.60 - 1.00 mg/dL 9.12  9.06  8.81   Sodium 135 - 146 mmol/L 141  141  141   Potassium 3.5 - 5.3 mmol/L 4.1  4.6  5.1   Chloride 98 - 110 mmol/L 105  105  106   CO2 20 - 32 mmol/L 31  31  28    Calcium  8.6 - 10.4 mg/dL 8.5  8.8  9.0   Total Protein 6.1 - 8.1 g/dL 6.3  6.5    Total Bilirubin 0.2 - 1.2 mg/dL 0.4  0.5    AST 10 - 35 U/L 13  22    ALT 6 - 29 U/L 10  14      Renal function Estimated Creatinine Clearance: 76.6 mL/min (by C-G formula based on SCr of 0.87 mg/dL).  Hgb A1c MFr Bld (%)  Date Value  07/31/2022 5.7    LDL Chol Calc (NIH)  Date Value Ref Range Status  08/02/2023 117 (H) 0 - 99 mg/dL Final   LDL Cholesterol  Date Value Ref Range Status  09/20/2023 101 (H) 0 - 99 mg/dL Final   Direct LDL  Date Value Ref Range Status  03/03/2020 165.0 mg/dL Final    Comment:    Optimal:  <100 mg/dLNear or Above Optimal:  100-129 mg/dLBorderline High:  130-159 mg/dLHigh:  160-189 mg/dLVery High:  >190 mg/dL     LOWER EXTREMITY DOPPLER STUDY   Patient Name:  Rita Watkins  Date of Exam:   10/22/2023  Medical Rec #: [id number]     Accession #:    [id number]  Date of Birth: [birth year]     Patient Gender: F  Patient Age:   8 years  Exam Location:  [place]  Procedure:      VAS US  ABI WITH/WO TBI  Referring  Phys: DONNICE FEES    ---------------------------------------------------------------------------  -----    Indications: Ulceration. left AKA   High Risk Factors: Hypertension, hyperlipidemia, past history of smoking.   Other Factors: Ulceration on outer side of right great toe. Morbidly  obese.   Vascular Interventions: Left above knee amputation.   Performing Technologist: Alan Greenhouse RDMS, RVT, RDCS     Examination Guidelines: A complete evaluation includes at minimum, Doppler  waveform signals and systolic blood pressure reading at the level of  bilateral  brachial, anterior tibial, and posterior tibial arteries, when vessel  segments  are accessible. Bilateral testing is considered an integral part of a  complete  examination. Photoelectric Plethysmograph (PPG) waveforms and toe systolic  pressure readings are included as required and additional duplex testing  as  needed. Limited examinations for reoccurring indications may be performed  as  noted.     ABI Findings:  +---------+------------------+-----+----------+--------+  Right   Rt Pressure (mmHg)IndexWaveform  Comment   +---------+------------------+-----+----------+--------+  Brachial 167                    biphasic            +---------+------------------+-----+----------+--------+  ATA     164               0.91 biphasic            +---------+------------------+-----+----------+--------+  PTA     114               0.63 monophasic          +---------+------------------+-----+----------+--------+  PERO    162               0.90 monophasic          +---------+------------------+-----+----------+--------+  Great Toe114               0.63 Abnormal            +---------+------------------+-----+----------+--------+   +--------+------------------+-----+--------+-------+  Left   Lt Pressure (mmHg)IndexWaveformComment   +--------+------------------+-----+--------+-------+  [id number]                   biphasic         +--------+------------------+-----+--------+-------+      TOES Findings:  +----------+---------------+--------+-------+  Right ToesPressure (mmHg)WaveformComment  +----------+---------------+--------+-------+  1st Digit                Abnormal         +----------+---------------+--------+-------+  2nd Digit                Abnormal         +----------+---------------+--------+-------+  3rd Digit                Abnormal         +----------+---------------+--------+-------+  4th Digit                Abnormal         +----------+---------------+--------+-------+  5th Digit                Abnormal         +----------+---------------+--------+-------+               Summary:  Right: Resting right ankle-brachial index indicates mild right lower  extremity arterial disease. The right toe-brachial index is abnormal.   Debby SAILOR. Magda, MD FACS [facility] Phone Number: [phone] 12/10/2023 5:01 PM   Total time spent on preparing this encounter including chart review, data review, collecting history, examining the patient, and coordinating care: 45 min  Portions of this report may have been transcribed using voice recognition software.  Every effort has been made to ensure accuracy; however, inadvertent computerized transcription errors may still be present.

## 2023-12-11 ENCOUNTER — Telehealth: Payer: Self-pay

## 2023-12-11 ENCOUNTER — Ambulatory Visit: Attending: Vascular Surgery | Admitting: Vascular Surgery

## 2023-12-11 ENCOUNTER — Encounter: Payer: Self-pay | Admitting: Vascular Surgery

## 2023-12-11 VITALS — BP 149/78 | HR 65 | Temp 97.7°F

## 2023-12-11 DIAGNOSIS — I739 Peripheral vascular disease, unspecified: Secondary | ICD-10-CM | POA: Diagnosis not present

## 2023-12-11 NOTE — Telephone Encounter (Signed)
 Pt reports she had an apt with vein and vascular provider today and she was prescribed 80 mg aspirin daily. She wanted to check with the coumadin  clinic to make sure it is ok to take with warfarin.   Advised it is ok to take with warfarin if it is prescribed. Advised to always be aware for s/s of abnormal bruising or bleeding and if any to call 911. Pt denies any s/s currently. Pt verbalized understanding.    Review of chart revealed provider seen today has not finished note yet. Will review not tomorrow for updates.

## 2023-12-12 NOTE — Telephone Encounter (Signed)
 Review of vein and vascular provider note from yesterday's visit does not mention adding 81 mg daily aspirin. Forwarding note to PCP to make aware.

## 2023-12-18 ENCOUNTER — Ambulatory Visit (INDEPENDENT_AMBULATORY_CARE_PROVIDER_SITE_OTHER)

## 2023-12-18 DIAGNOSIS — Z7901 Long term (current) use of anticoagulants: Secondary | ICD-10-CM | POA: Diagnosis not present

## 2023-12-18 LAB — POCT INR: INR: 2.2 (ref 2.0–3.0)

## 2023-12-18 NOTE — Progress Notes (Signed)
 Indication: recurrent DVT Pt was prescribed 81 mg aspirin by vein and vascular. Pt denies any abnormal bruising or bleeding. Advised if any s/s go to ER. Pt verbalized understanding.  Continue 1/2 tablet daily except take 1 tablet Fridays. Recheck in 4 weeks. Contact the coumadin  clinic number at [phone], if any changes or any questions.

## 2023-12-18 NOTE — Patient Instructions (Addendum)
 Pre visit review using our clinic review tool, if applicable. No additional management support is needed unless otherwise documented below in the visit note.  Continue 1/2 tablet daily except take 1 tablet Fridays. Recheck in 4 weeks. Contact the coumadin  clinic number at [phone], if any changes or any questions.

## 2024-01-01 ENCOUNTER — Encounter: Payer: Self-pay | Admitting: Medical

## 2024-01-01 MED ORDER — ALPRAZOLAM 0.5 MG PO TABS: 0.5000 mg | ORAL_TABLET | Freq: Every evening | ORAL | 1 refills | Status: AC | PRN

## 2024-01-01 NOTE — Addendum Note (Signed)
 Addended by: DORINA DALLAS HERO on: 01/01/2024 09:21 AM   Modules accepted: Orders

## 2024-01-14 IMAGING — US US EXTREM LOW VENOUS*R*
1 series · 13 of 24 positions shown · non-contrast
Comparison: None.

CLINICAL DATA: Pain and swelling

EXAM:
Right LOWER EXTREMITY VENOUS DOPPLER ULTRASOUND
TECHNIQUE: Gray-scale sonography with compression, as well as color and duplex
ultrasound, were performed to evaluate the deep venous system(s)
from the level of the common femoral vein through the popliteal and
proximal calf veins.

[Series 1: us extrem low venous*right* · 13 of 39 slices shown]
[im 1/39]
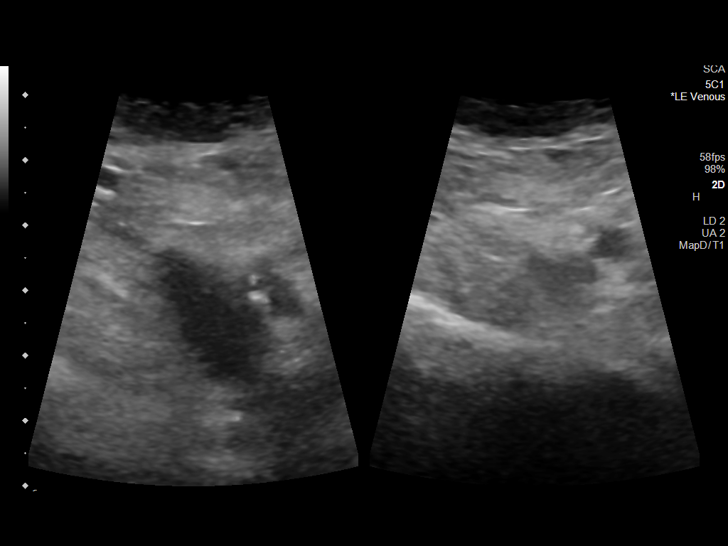
[im 4/39]
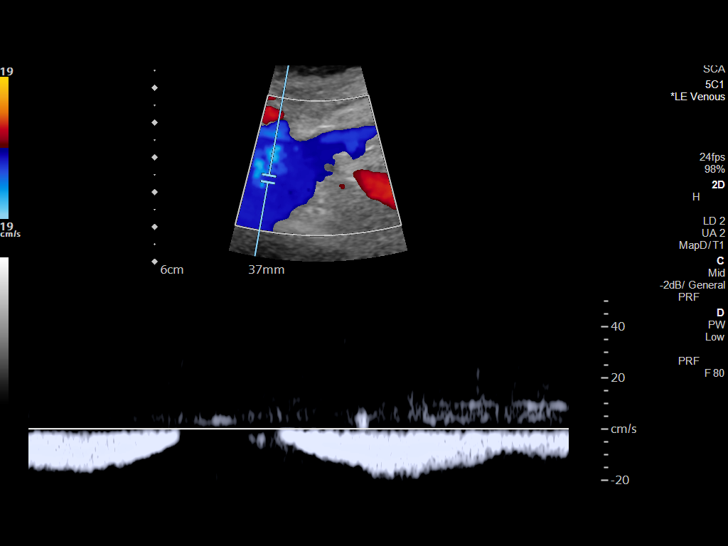
[im 7/39]
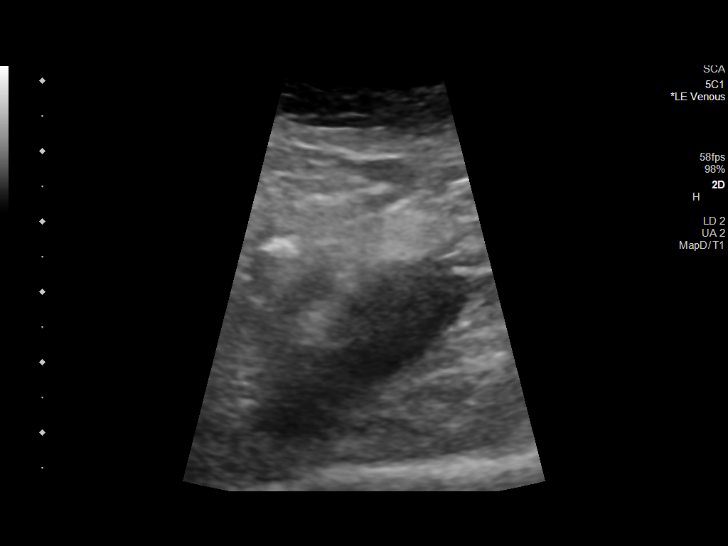
[im 10/39]
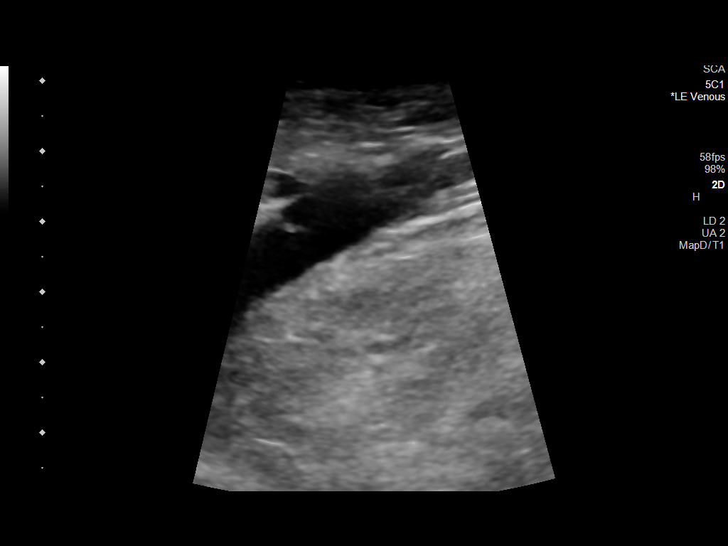
[im 14/39]
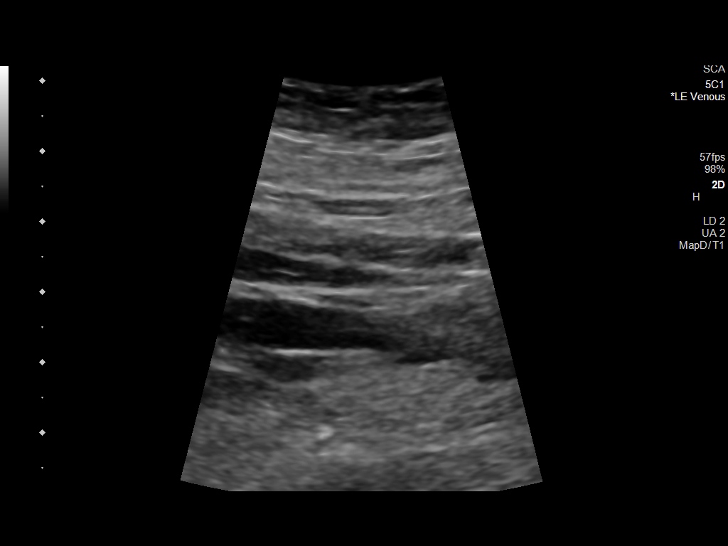
[im 17/39]
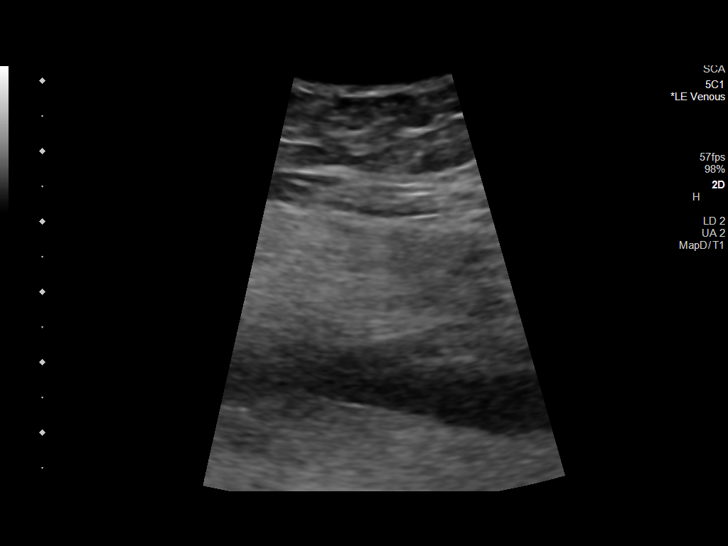
[im 20/39]
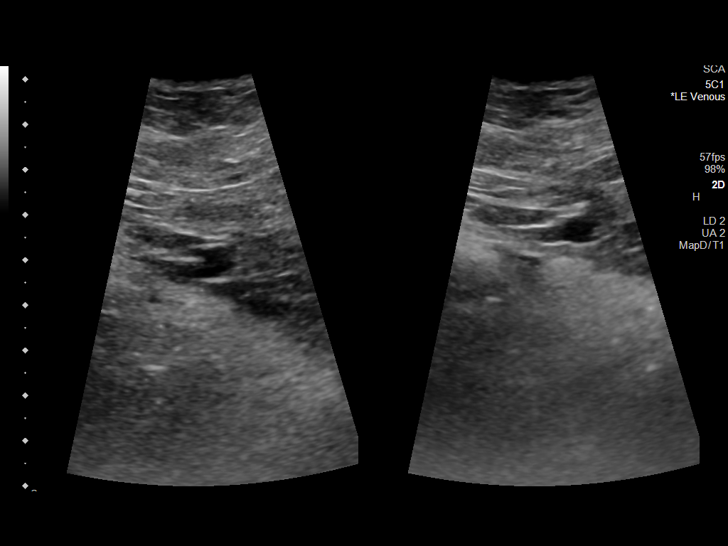
[im 22/39]
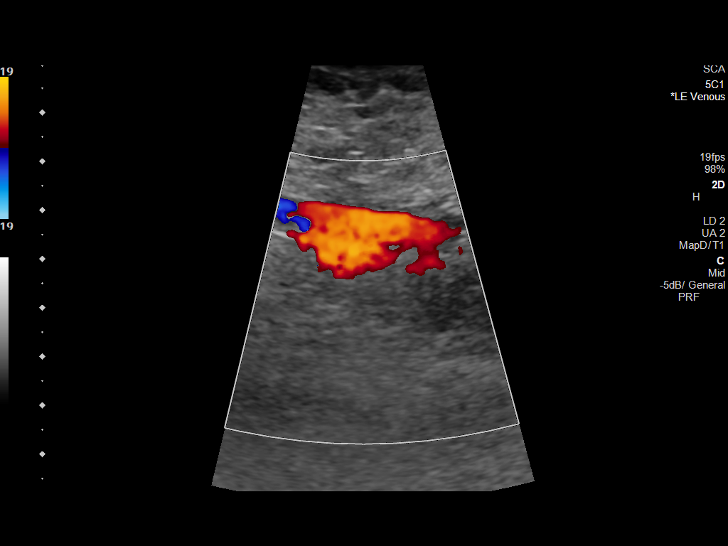
[im 25/39]
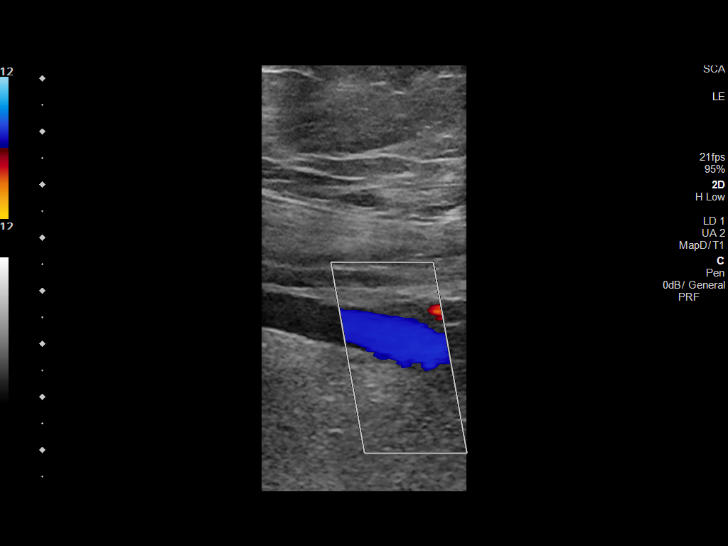
[im 29/39]
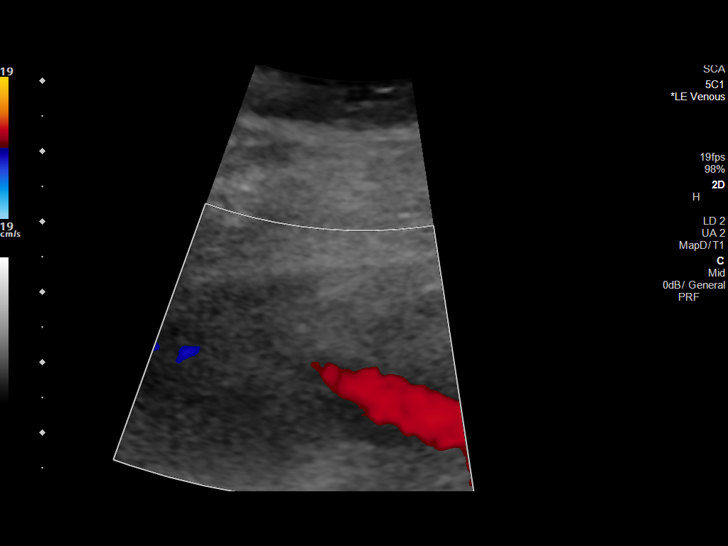
[im 32/39]
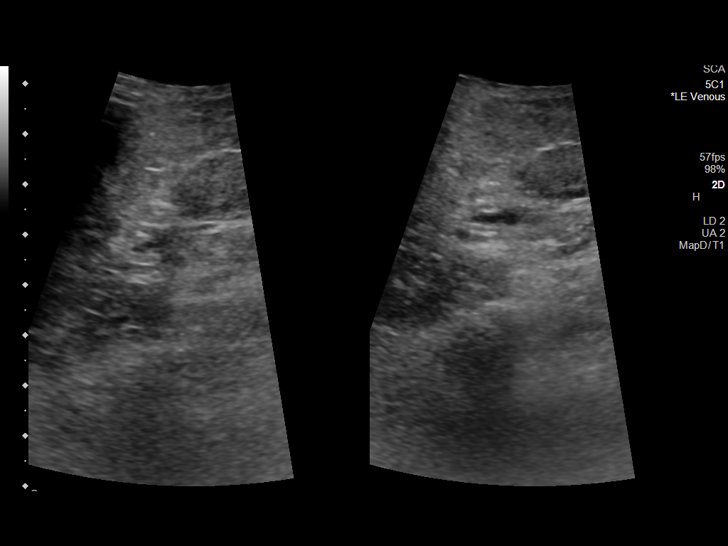
[im 35/39]
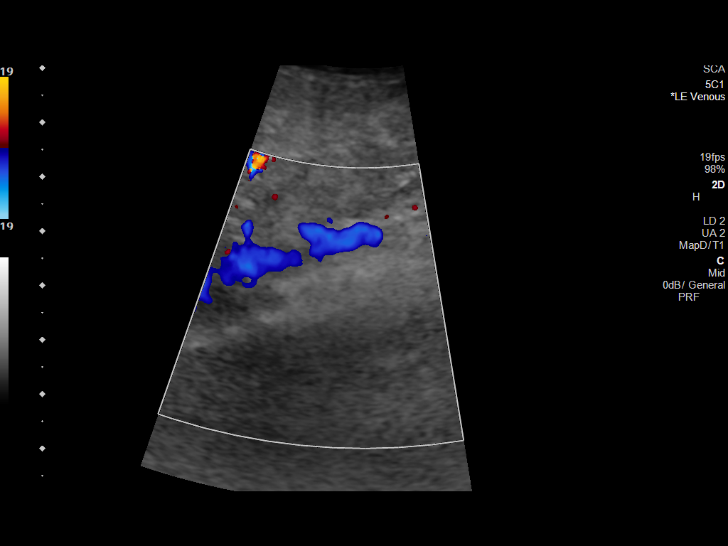
[im 39/39]
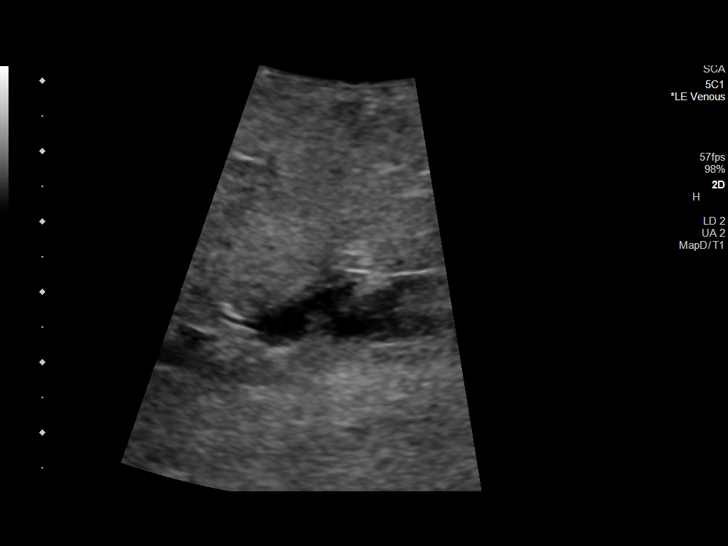

[13 of 24 positions shown; findings below may reference images not displayed]

FINDINGS: VENOUS

Normal compressibility of the common femoral, superficial femoral,
and popliteal veins. Evaluation of deep veins in the right calf is
technically limited due to the patient's body habitus. Visualized
portions of profunda femoral vein and great saphenous vein
unremarkable. No filling defects to suggest DVT on grayscale or
color Doppler imaging. Doppler waveforms show normal direction of
venous flow, normal respiratory plasticity and response to
augmentation.

Limited views of the contralateral common femoral vein are
unremarkable.

OTHER

None.

Limitations: none
IMPRESSION: There is no evidence of deep venous thrombosis in the right lower
extremity. Deep veins in the right calf are not optimally visualized
due to the patient's body habitus. If there are persistent symptoms,
short-term follow-up venous Doppler examination may be considered.

## 2024-01-15 ENCOUNTER — Ambulatory Visit

## 2024-01-15 DIAGNOSIS — Z7901 Long term (current) use of anticoagulants: Secondary | ICD-10-CM | POA: Diagnosis not present

## 2024-01-15 LAB — POCT INR: INR: 1.9 — AB (ref 2.0–3.0)

## 2024-01-15 NOTE — Patient Instructions (Addendum)
 Pre visit review using our clinic review tool, if applicable. No additional management support is needed unless otherwise documented below in the visit note.  Increase dose today to take 1 tablet and then continue 1/2 tablet daily except take 1 tablet Fridays. Recheck in 4 weeks. Contact the coumadin  clinic number at [phone], if any changes or any questions.

## 2024-01-15 NOTE — Progress Notes (Signed)
 Indication: recurrent DVT Pt has been eating cashews which is different in her diet. Advised pt these contain vitamin K and should be kept consist in her diet if she is going to eat them. Pt verbalized understanding.  Increase dose today to take 1 tablet and then continue 1/2 tablet daily except take 1 tablet Fridays. Recheck in 4 weeks. Contact the coumadin  clinic number at [phone], if any changes or any questions.

## 2024-01-18 ENCOUNTER — Other Ambulatory Visit: Payer: Self-pay | Admitting: Medical

## 2024-01-18 DIAGNOSIS — Z7901 Long term (current) use of anticoagulants: Secondary | ICD-10-CM

## 2024-02-05 ENCOUNTER — Other Ambulatory Visit: Payer: Self-pay | Admitting: Medical

## 2024-02-05 ENCOUNTER — Ambulatory Visit (INDEPENDENT_AMBULATORY_CARE_PROVIDER_SITE_OTHER)

## 2024-02-05 DIAGNOSIS — Z7901 Long term (current) use of anticoagulants: Secondary | ICD-10-CM

## 2024-02-05 LAB — POCT INR: INR: 2.4 (ref 2.0–3.0)

## 2024-02-05 NOTE — Telephone Encounter (Signed)
 Requesting: alprazolam  0.5mg   Contract: 03/26/23 UDS: 03/26/23 Last Visit: 09/20/23 Next Visit: None Last Refill: 12/05/23 #60 and 1RF   Please Advise

## 2024-02-05 NOTE — Progress Notes (Signed)
 Indication: recurrent DVT Continue 1/2 tablet daily except take 1 tablet Fridays. Recheck in 5 weeks. Contact the coumadin  clinic number at [phone], if any changes or any questions.

## 2024-02-05 NOTE — Patient Instructions (Addendum)
 Pre visit review using our clinic review tool, if applicable. No additional management support is needed unless otherwise documented below in the visit note.  Continue 1/2 tablet daily except take 1 tablet Fridays. Recheck in 5 weeks. Contact the coumadin  clinic number at [phone], if any changes or any questions.

## 2024-02-09 ENCOUNTER — Other Ambulatory Visit (HOSPITAL_COMMUNITY): Payer: Self-pay

## 2024-02-11 ENCOUNTER — Other Ambulatory Visit (HOSPITAL_COMMUNITY): Payer: Self-pay

## 2024-02-14 ENCOUNTER — Other Ambulatory Visit: Payer: Self-pay | Admitting: Medical

## 2024-02-19 NOTE — Progress Notes (Signed)
 Rita Watkins                                          MRN: [id number]   02/19/2024   The [facility] Quality Team Specialist reviewed this patient medical record for the purposes of chart review for care gap closure. The following were reviewed: chart review for care gap closure-controlling blood pressure.    [facility] Quality Team

## 2024-02-26 ENCOUNTER — Other Ambulatory Visit: Payer: Self-pay | Admitting: Internal Medicine

## 2024-02-26 DIAGNOSIS — M059 Rheumatoid arthritis with rheumatoid factor, unspecified: Secondary | ICD-10-CM

## 2024-02-26 DIAGNOSIS — Z79899 Other long term (current) drug therapy: Secondary | ICD-10-CM

## 2024-02-26 NOTE — Telephone Encounter (Signed)
 Last Fill: 11/27/2023  Labs: 11/27/2023 Calcium  8.5  Next Visit: 04/01/2024  Last Visit: 11/27/2023  DX: Seropositive rheumatoid arthritis (HCC)   Current Dose per office note 11/27/2023: methotrexate  15 mg p.o. weekly   Contacted the patient and advised she is due for labs. Patient inquired if she could update her labs at her upcomming appointment in February due to her condition and being amputee. Please advise.   Okay to refill Methotrexate ?

## 2024-02-28 NOTE — Telephone Encounter (Signed)
 Patient called office stating she was out of her medication and would like to know if she could get her medication refilled. Patient stated she knows she is due for labs and would like to get them done at her next appointment on 04/01/2024.

## 2024-03-11 ENCOUNTER — Ambulatory Visit

## 2024-03-11 DIAGNOSIS — Z7901 Long term (current) use of anticoagulants: Secondary | ICD-10-CM

## 2024-03-11 LAB — POCT INR: INR: 2.3 (ref 2.0–3.0)

## 2024-03-11 NOTE — Patient Instructions (Addendum)
 Pre visit review using our clinic review tool, if applicable. No additional management support is needed unless otherwise documented below in the visit note.  Continue 1/2 tablet daily except take 1 tablet Fridays. Recheck in 4 weeks. Contact the coumadin  clinic number at [phone], if any changes or any questions.

## 2024-03-11 NOTE — Progress Notes (Cosign Needed)
 Indication: recurrent DVT Pt usually checks Q4wks Continue 1/2 tablet daily except take 1 tablet Fridays. Recheck in 4 weeks. Contact the coumadin  clinic number at [phone], if any changes or any questions.

## 2024-03-14 ENCOUNTER — Other Ambulatory Visit: Payer: Self-pay | Admitting: Medical

## 2024-04-01 ENCOUNTER — Ambulatory Visit: Admitting: Internal Medicine

## 2024-04-08 ENCOUNTER — Ambulatory Visit

## 2024-10-30 ENCOUNTER — Ambulatory Visit
